# Patient Record
Sex: Female | Born: 1965 | Race: Black or African American | Hispanic: No | Marital: Married | Smoking: Never smoker
Health system: Southern US, Community
[De-identification: ages and names within clinical notes are randomized; demographics above are authoritative.]

## PROBLEM LIST (undated history)

## (undated) DIAGNOSIS — K209 Esophagitis, unspecified without bleeding: Secondary | ICD-10-CM

## (undated) DIAGNOSIS — K222 Esophageal obstruction: Secondary | ICD-10-CM

## (undated) DIAGNOSIS — K449 Diaphragmatic hernia without obstruction or gangrene: Secondary | ICD-10-CM

## (undated) DIAGNOSIS — K219 Gastro-esophageal reflux disease without esophagitis: Secondary | ICD-10-CM

## (undated) DIAGNOSIS — G43909 Migraine, unspecified, not intractable, without status migrainosus: Secondary | ICD-10-CM

## (undated) DIAGNOSIS — M1990 Unspecified osteoarthritis, unspecified site: Secondary | ICD-10-CM

## (undated) DIAGNOSIS — G4730 Sleep apnea, unspecified: Secondary | ICD-10-CM

## (undated) DIAGNOSIS — I10 Essential (primary) hypertension: Secondary | ICD-10-CM

## (undated) DIAGNOSIS — D509 Iron deficiency anemia, unspecified: Secondary | ICD-10-CM

## (undated) HISTORY — PX: KNEE SURGERY: SHX244

## (undated) HISTORY — PX: ESOPHAGOGASTRODUODENOSCOPY: SHX1529

## (undated) HISTORY — DX: Iron deficiency anemia, unspecified: D50.9

## (undated) HISTORY — DX: Migraine, unspecified, not intractable, without status migrainosus: G43.909

## (undated) HISTORY — DX: Morbid (severe) obesity due to excess calories: E66.01

---

## 1998-07-17 ENCOUNTER — Inpatient Hospital Stay (HOSPITAL_COMMUNITY): Admission: AD | Admit: 1998-07-17 | Discharge: 1998-07-17 | Payer: Self-pay | Admitting: *Deleted

## 1998-07-18 ENCOUNTER — Ambulatory Visit (HOSPITAL_COMMUNITY): Admission: RE | Admit: 1998-07-18 | Discharge: 1998-07-18 | Payer: Self-pay | Admitting: *Deleted

## 1998-08-13 ENCOUNTER — Encounter: Admission: RE | Admit: 1998-08-13 | Discharge: 1998-08-13 | Payer: Self-pay | Admitting: Obstetrics & Gynecology

## 1998-08-13 ENCOUNTER — Other Ambulatory Visit: Admission: RE | Admit: 1998-08-13 | Discharge: 1998-08-13 | Payer: Self-pay | Admitting: Obstetrics & Gynecology

## 1998-10-28 ENCOUNTER — Emergency Department (HOSPITAL_COMMUNITY): Admission: EM | Admit: 1998-10-28 | Discharge: 1998-10-28 | Payer: Self-pay

## 1999-03-18 ENCOUNTER — Encounter: Admission: RE | Admit: 1999-03-18 | Discharge: 1999-03-18 | Payer: Self-pay | Admitting: Obstetrics & Gynecology

## 1999-11-11 ENCOUNTER — Encounter: Admission: RE | Admit: 1999-11-11 | Discharge: 1999-11-11 | Payer: Self-pay | Admitting: Obstetrics & Gynecology

## 2001-01-21 ENCOUNTER — Other Ambulatory Visit: Admission: RE | Admit: 2001-01-21 | Discharge: 2001-01-21 | Payer: Self-pay | Admitting: Obstetrics and Gynecology

## 2001-03-09 HISTORY — PX: ABDOMINAL HYSTERECTOMY: SHX81

## 2001-08-25 ENCOUNTER — Encounter (INDEPENDENT_AMBULATORY_CARE_PROVIDER_SITE_OTHER): Payer: Self-pay

## 2001-08-25 ENCOUNTER — Ambulatory Visit (HOSPITAL_COMMUNITY): Admission: RE | Admit: 2001-08-25 | Discharge: 2001-08-25 | Payer: Self-pay | Admitting: Obstetrics and Gynecology

## 2001-10-05 ENCOUNTER — Encounter (INDEPENDENT_AMBULATORY_CARE_PROVIDER_SITE_OTHER): Payer: Self-pay

## 2001-10-05 ENCOUNTER — Inpatient Hospital Stay (HOSPITAL_COMMUNITY): Admission: RE | Admit: 2001-10-05 | Discharge: 2001-10-08 | Payer: Self-pay | Admitting: Obstetrics and Gynecology

## 2001-11-09 ENCOUNTER — Ambulatory Visit (HOSPITAL_COMMUNITY): Admission: RE | Admit: 2001-11-09 | Discharge: 2001-11-09 | Payer: Self-pay | Admitting: Obstetrics and Gynecology

## 2001-11-09 ENCOUNTER — Encounter: Payer: Self-pay | Admitting: Obstetrics and Gynecology

## 2002-02-08 ENCOUNTER — Other Ambulatory Visit: Admission: RE | Admit: 2002-02-08 | Discharge: 2002-02-08 | Payer: Self-pay | Admitting: Obstetrics and Gynecology

## 2002-02-14 ENCOUNTER — Encounter: Admission: RE | Admit: 2002-02-14 | Discharge: 2002-02-14 | Payer: Self-pay | Admitting: Obstetrics and Gynecology

## 2002-02-14 ENCOUNTER — Encounter: Payer: Self-pay | Admitting: Obstetrics and Gynecology

## 2002-11-02 ENCOUNTER — Emergency Department (HOSPITAL_COMMUNITY): Admission: EM | Admit: 2002-11-02 | Discharge: 2002-11-02 | Payer: Self-pay | Admitting: Emergency Medicine

## 2003-03-20 ENCOUNTER — Other Ambulatory Visit: Admission: RE | Admit: 2003-03-20 | Discharge: 2003-03-20 | Payer: Self-pay | Admitting: Obstetrics and Gynecology

## 2003-03-23 ENCOUNTER — Ambulatory Visit (HOSPITAL_COMMUNITY): Admission: RE | Admit: 2003-03-23 | Discharge: 2003-03-23 | Payer: Self-pay | Admitting: Gastroenterology

## 2003-03-26 ENCOUNTER — Ambulatory Visit (HOSPITAL_COMMUNITY): Admission: RE | Admit: 2003-03-26 | Discharge: 2003-03-26 | Payer: Self-pay | Admitting: Gastroenterology

## 2003-05-06 ENCOUNTER — Emergency Department (HOSPITAL_COMMUNITY): Admission: AD | Admit: 2003-05-06 | Discharge: 2003-05-06 | Payer: Self-pay | Admitting: Family Medicine

## 2003-05-16 ENCOUNTER — Encounter (INDEPENDENT_AMBULATORY_CARE_PROVIDER_SITE_OTHER): Payer: Self-pay | Admitting: Specialist

## 2003-05-16 ENCOUNTER — Encounter (INDEPENDENT_AMBULATORY_CARE_PROVIDER_SITE_OTHER): Payer: Self-pay | Admitting: *Deleted

## 2003-05-16 ENCOUNTER — Inpatient Hospital Stay (HOSPITAL_COMMUNITY): Admission: AD | Admit: 2003-05-16 | Discharge: 2003-05-19 | Payer: Self-pay | Admitting: Obstetrics and Gynecology

## 2003-07-13 ENCOUNTER — Ambulatory Visit (HOSPITAL_COMMUNITY): Admission: RE | Admit: 2003-07-13 | Discharge: 2003-07-13 | Payer: Self-pay | Admitting: Obstetrics and Gynecology

## 2003-08-13 ENCOUNTER — Ambulatory Visit (HOSPITAL_COMMUNITY): Admission: RE | Admit: 2003-08-13 | Discharge: 2003-08-13 | Payer: Self-pay | Admitting: Obstetrics and Gynecology

## 2003-10-29 ENCOUNTER — Emergency Department (HOSPITAL_COMMUNITY): Admission: EM | Admit: 2003-10-29 | Discharge: 2003-10-29 | Payer: Self-pay | Admitting: Emergency Medicine

## 2003-10-30 ENCOUNTER — Emergency Department (HOSPITAL_COMMUNITY): Admission: EM | Admit: 2003-10-30 | Discharge: 2003-10-30 | Payer: Self-pay | Admitting: Emergency Medicine

## 2004-04-16 ENCOUNTER — Emergency Department (HOSPITAL_COMMUNITY): Admission: EM | Admit: 2004-04-16 | Discharge: 2004-04-16 | Payer: Self-pay | Admitting: Emergency Medicine

## 2004-04-17 ENCOUNTER — Other Ambulatory Visit: Admission: RE | Admit: 2004-04-17 | Discharge: 2004-04-17 | Payer: Self-pay | Admitting: Obstetrics and Gynecology

## 2004-04-24 ENCOUNTER — Ambulatory Visit (HOSPITAL_COMMUNITY): Admission: RE | Admit: 2004-04-24 | Discharge: 2004-04-24 | Payer: Self-pay | Admitting: Obstetrics and Gynecology

## 2004-06-29 ENCOUNTER — Inpatient Hospital Stay (HOSPITAL_COMMUNITY): Admission: AD | Admit: 2004-06-29 | Discharge: 2004-06-29 | Payer: Self-pay | Admitting: Obstetrics and Gynecology

## 2004-07-30 ENCOUNTER — Ambulatory Visit (HOSPITAL_COMMUNITY): Admission: RE | Admit: 2004-07-30 | Discharge: 2004-07-30 | Payer: Self-pay | Admitting: Obstetrics and Gynecology

## 2004-09-07 ENCOUNTER — Encounter: Admission: RE | Admit: 2004-09-07 | Discharge: 2004-09-07 | Payer: Self-pay | Admitting: Obstetrics and Gynecology

## 2005-02-02 ENCOUNTER — Encounter: Admission: RE | Admit: 2005-02-02 | Discharge: 2005-02-02 | Payer: Self-pay | Admitting: Obstetrics and Gynecology

## 2005-02-03 ENCOUNTER — Observation Stay (HOSPITAL_COMMUNITY): Admission: EM | Admit: 2005-02-03 | Discharge: 2005-02-03 | Payer: Self-pay | Admitting: Emergency Medicine

## 2005-02-25 ENCOUNTER — Emergency Department (HOSPITAL_COMMUNITY): Admission: EM | Admit: 2005-02-25 | Discharge: 2005-02-25 | Payer: Self-pay | Admitting: Family Medicine

## 2005-04-20 ENCOUNTER — Other Ambulatory Visit: Admission: RE | Admit: 2005-04-20 | Discharge: 2005-04-20 | Payer: Self-pay | Admitting: Obstetrics and Gynecology

## 2005-05-21 ENCOUNTER — Encounter: Admission: RE | Admit: 2005-05-21 | Discharge: 2005-05-21 | Payer: Self-pay | Admitting: Obstetrics and Gynecology

## 2005-06-08 ENCOUNTER — Inpatient Hospital Stay (HOSPITAL_COMMUNITY): Admission: RE | Admit: 2005-06-08 | Discharge: 2005-06-11 | Payer: Self-pay | Admitting: Obstetrics and Gynecology

## 2005-06-08 ENCOUNTER — Encounter (INDEPENDENT_AMBULATORY_CARE_PROVIDER_SITE_OTHER): Payer: Self-pay | Admitting: *Deleted

## 2005-10-13 IMAGING — US US TRANSVAGINAL NON-OB
1 series · 14 of 25 positions shown · non-contrast
Comparison: none

CLINICAL DATA: Evaluate left adnexal mass.   Status post total abdominal hysterectomy and right oophorectomy in 6996.   Left oophorectomy in 0448.
PELVIC ULTRASOUND:
Correlation is made with the previous exam dated 06/29/04.  
A normal vaginal cuff is identified.  
In the left adnexa there is a complex cystic mass identified which has an overall dimension of 4.7 x 4.0 x 6.6 cm.   Attempting to measure on the prior exam in a similar plane, little internal change in the overall size is felt to have occurred.

[Series 1: us transvaginal non-ob · 0.15mm/px · 14 of 29 slices shown]
[im 1/29]
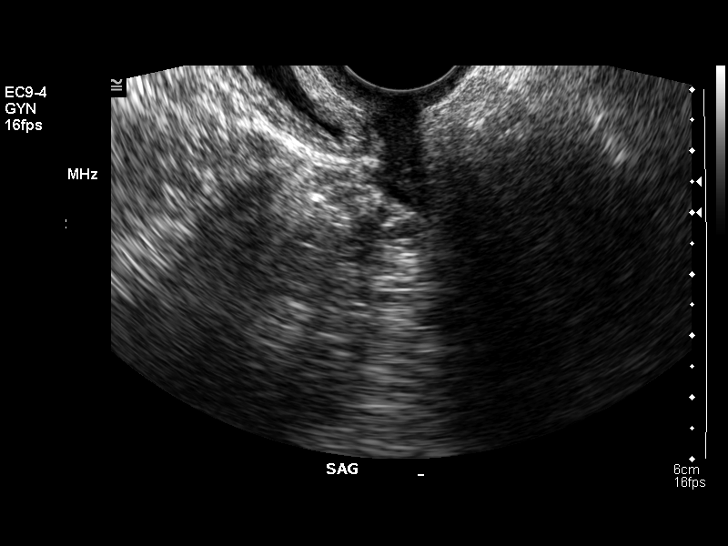
[im 3/29]
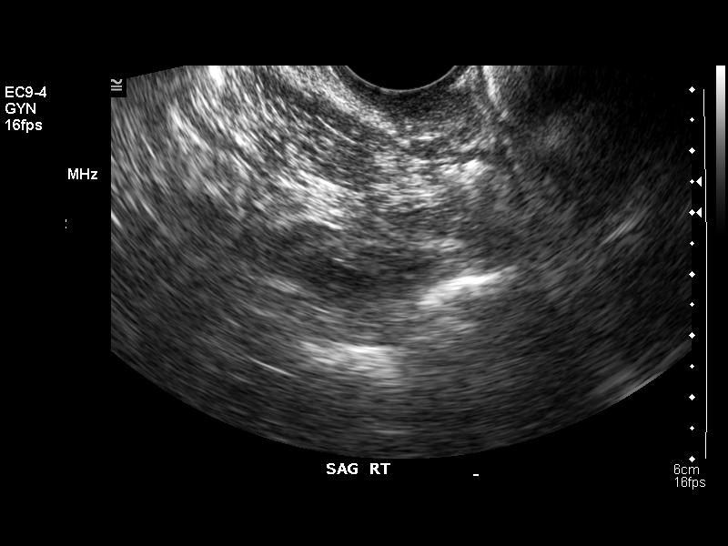
[im 5/29]
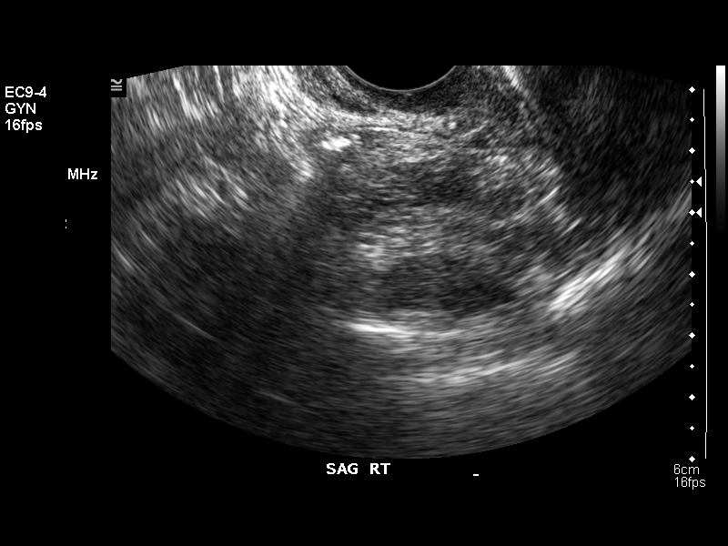
[im 8/29]
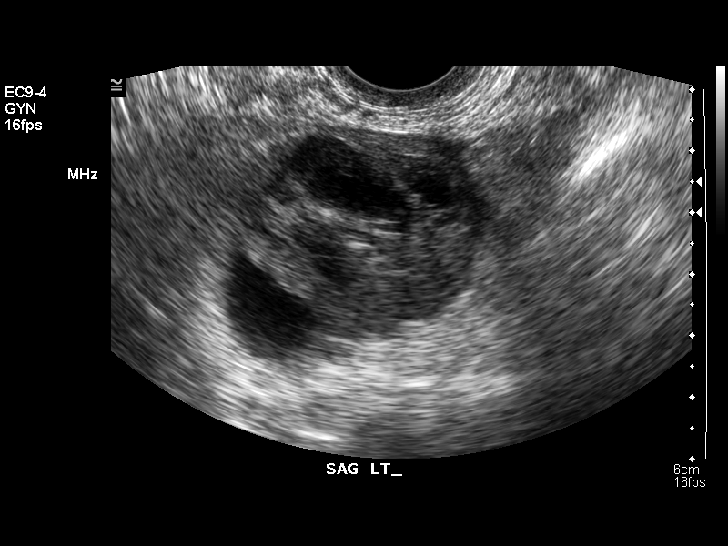
[im 10/29]
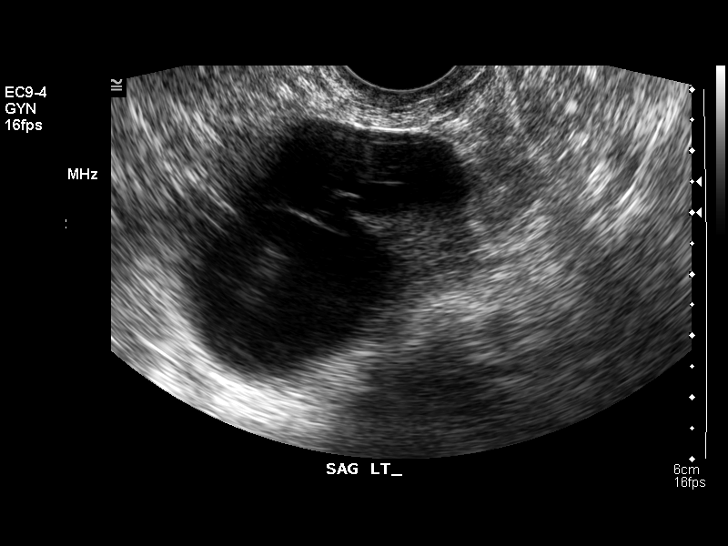
[im 11/29]
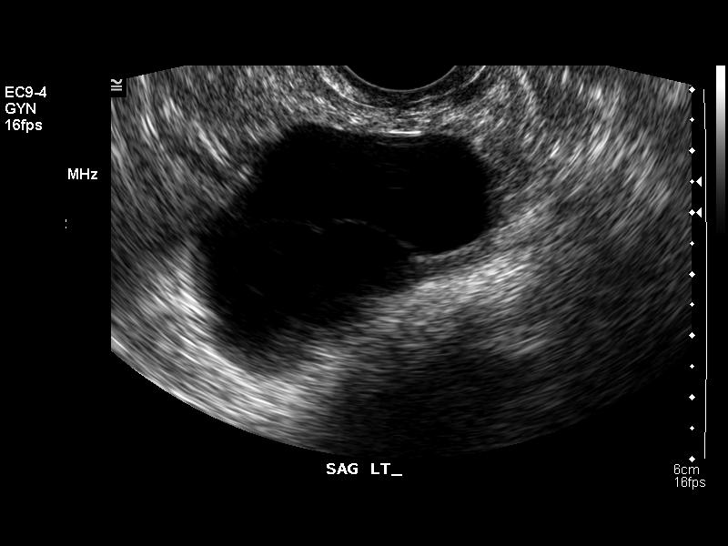
[im 13/29]
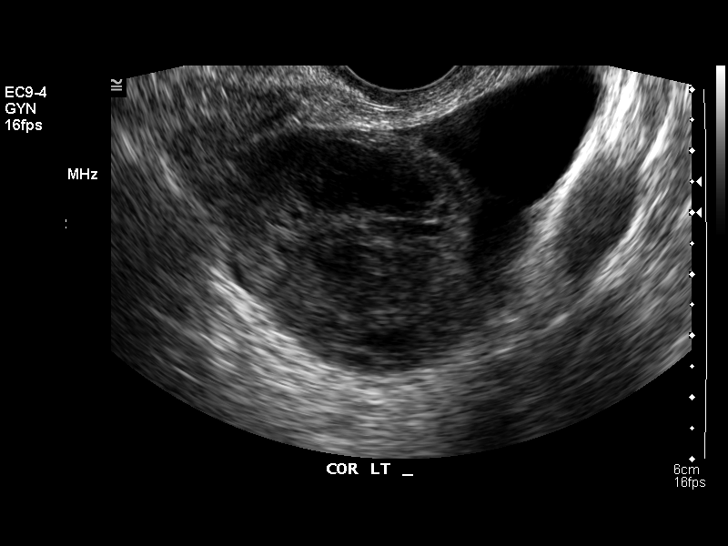
[im 16/29]
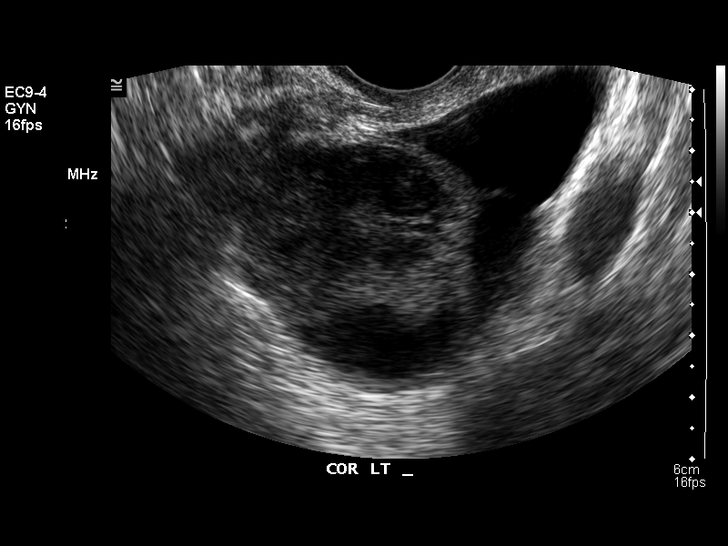
[im 18/29]
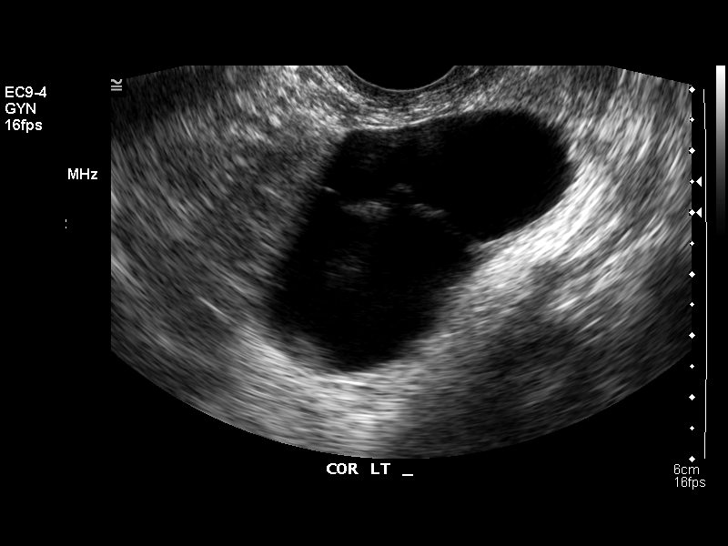
[im 19/29]
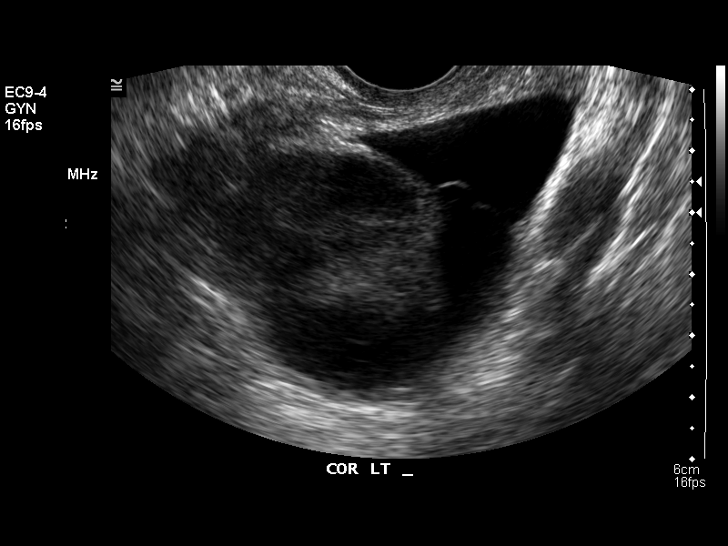
[im 22/29]
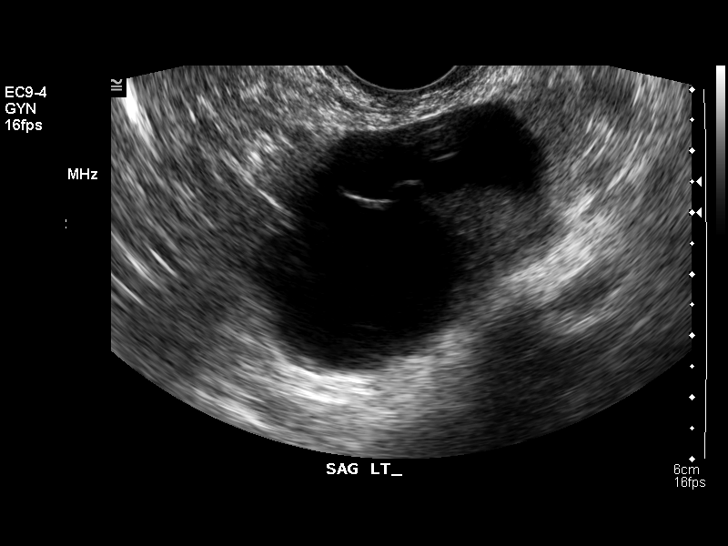
[im 24/29]
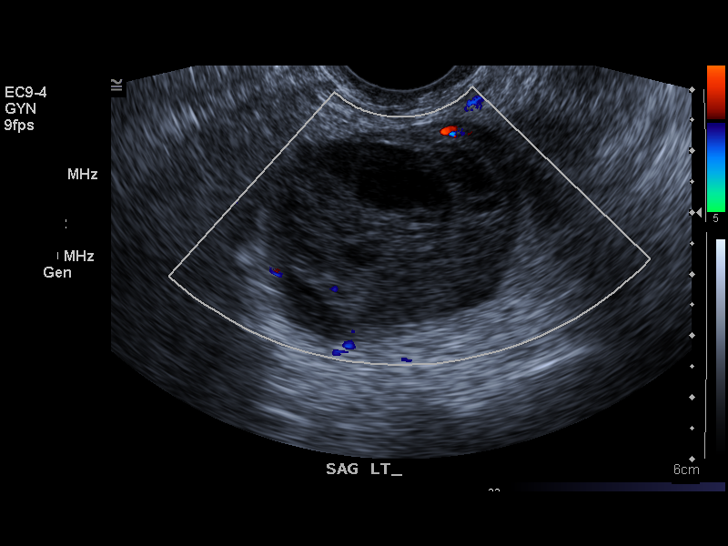
[im 26/29]
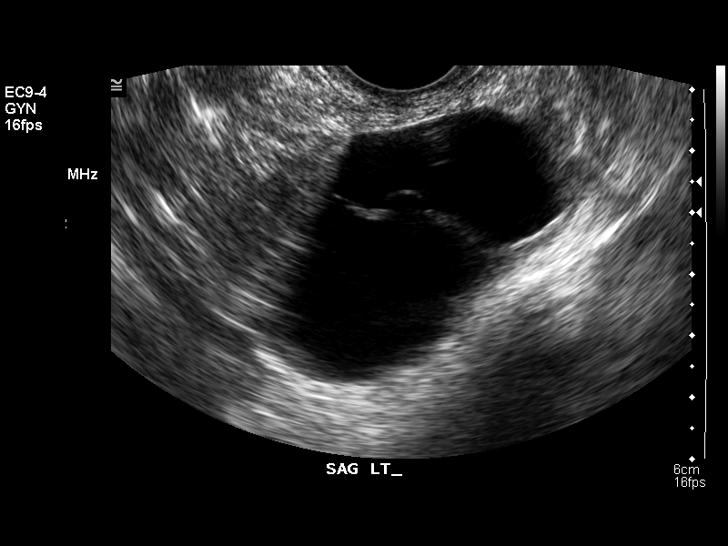
[im 29/29]
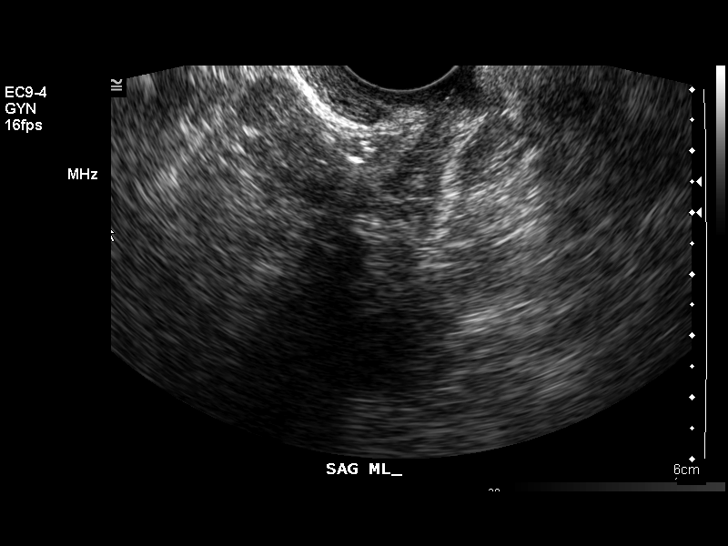

[14 of 25 positions shown; findings below may reference images not displayed]

IMPRESSION: Complex left adnexal mass of unclear etiology.   Please see above report for full discussion.

## 2005-10-26 ENCOUNTER — Emergency Department (HOSPITAL_COMMUNITY): Admission: EM | Admit: 2005-10-26 | Discharge: 2005-10-26 | Payer: Self-pay | Admitting: Emergency Medicine

## 2005-11-14 ENCOUNTER — Emergency Department (HOSPITAL_COMMUNITY): Admission: EM | Admit: 2005-11-14 | Discharge: 2005-11-14 | Payer: Self-pay | Admitting: Family Medicine

## 2005-11-21 ENCOUNTER — Emergency Department (HOSPITAL_COMMUNITY): Admission: EM | Admit: 2005-11-21 | Discharge: 2005-11-21 | Payer: Self-pay | Admitting: Emergency Medicine

## 2005-12-08 ENCOUNTER — Inpatient Hospital Stay (HOSPITAL_COMMUNITY): Admission: AD | Admit: 2005-12-08 | Discharge: 2005-12-08 | Payer: Self-pay | Admitting: Obstetrics and Gynecology

## 2006-05-24 ENCOUNTER — Encounter: Admission: RE | Admit: 2006-05-24 | Discharge: 2006-05-24 | Payer: Self-pay | Admitting: Obstetrics and Gynecology

## 2006-05-31 ENCOUNTER — Other Ambulatory Visit: Admission: RE | Admit: 2006-05-31 | Discharge: 2006-05-31 | Payer: Self-pay | Admitting: Obstetrics and Gynecology

## 2006-07-10 ENCOUNTER — Observation Stay (HOSPITAL_COMMUNITY): Admission: EM | Admit: 2006-07-10 | Discharge: 2006-07-10 | Payer: Self-pay | Admitting: *Deleted

## 2006-07-14 ENCOUNTER — Emergency Department (HOSPITAL_COMMUNITY): Admission: EM | Admit: 2006-07-14 | Discharge: 2006-07-14 | Payer: Self-pay | Admitting: Emergency Medicine

## 2006-11-19 ENCOUNTER — Emergency Department (HOSPITAL_COMMUNITY): Admission: EM | Admit: 2006-11-19 | Discharge: 2006-11-19 | Payer: Self-pay | Admitting: Family Medicine

## 2007-01-23 ENCOUNTER — Emergency Department (HOSPITAL_COMMUNITY): Admission: EM | Admit: 2007-01-23 | Discharge: 2007-01-23 | Payer: Self-pay | Admitting: Emergency Medicine

## 2007-02-16 ENCOUNTER — Emergency Department (HOSPITAL_COMMUNITY): Admission: EM | Admit: 2007-02-16 | Discharge: 2007-02-16 | Payer: Self-pay | Admitting: Emergency Medicine

## 2007-07-01 ENCOUNTER — Emergency Department (HOSPITAL_COMMUNITY): Admission: EM | Admit: 2007-07-01 | Discharge: 2007-07-01 | Payer: Self-pay | Admitting: Emergency Medicine

## 2007-07-05 ENCOUNTER — Inpatient Hospital Stay (HOSPITAL_COMMUNITY): Admission: EM | Admit: 2007-07-05 | Discharge: 2007-07-07 | Payer: Self-pay | Admitting: Emergency Medicine

## 2007-07-25 HISTORY — PX: CARDIOVASCULAR STRESS TEST: SHX262

## 2007-11-08 ENCOUNTER — Encounter: Admission: RE | Admit: 2007-11-08 | Discharge: 2007-11-08 | Payer: Self-pay | Admitting: Internal Medicine

## 2007-12-09 ENCOUNTER — Emergency Department (HOSPITAL_COMMUNITY): Admission: EM | Admit: 2007-12-09 | Discharge: 2007-12-09 | Payer: Self-pay | Admitting: Emergency Medicine

## 2008-05-30 ENCOUNTER — Encounter: Admission: RE | Admit: 2008-05-30 | Discharge: 2008-05-30 | Payer: Self-pay | Admitting: *Deleted

## 2008-06-10 ENCOUNTER — Emergency Department (HOSPITAL_COMMUNITY): Admission: EM | Admit: 2008-06-10 | Discharge: 2008-06-10 | Payer: Self-pay | Admitting: Emergency Medicine

## 2008-08-19 ENCOUNTER — Emergency Department (HOSPITAL_BASED_OUTPATIENT_CLINIC_OR_DEPARTMENT_OTHER): Admission: EM | Admit: 2008-08-19 | Discharge: 2008-08-19 | Payer: Self-pay | Admitting: Emergency Medicine

## 2008-10-14 ENCOUNTER — Emergency Department (HOSPITAL_COMMUNITY): Admission: EM | Admit: 2008-10-14 | Discharge: 2008-10-14 | Payer: Self-pay | Admitting: Emergency Medicine

## 2008-11-05 ENCOUNTER — Emergency Department (HOSPITAL_COMMUNITY): Admission: EM | Admit: 2008-11-05 | Discharge: 2008-11-05 | Payer: Self-pay | Admitting: Emergency Medicine

## 2008-12-11 ENCOUNTER — Encounter: Admission: RE | Admit: 2008-12-11 | Discharge: 2008-12-11 | Payer: Self-pay | Admitting: Family Medicine

## 2009-09-25 LAB — HM PAP SMEAR: HM Pap smear: NORMAL

## 2009-10-15 ENCOUNTER — Encounter: Admission: RE | Admit: 2009-10-15 | Discharge: 2009-10-15 | Payer: Self-pay | Admitting: Obstetrics

## 2010-03-30 ENCOUNTER — Encounter: Payer: Self-pay | Admitting: Obstetrics and Gynecology

## 2010-03-31 ENCOUNTER — Encounter: Payer: Self-pay | Admitting: Family Medicine

## 2010-04-03 ENCOUNTER — Emergency Department (HOSPITAL_COMMUNITY)
Admission: EM | Admit: 2010-04-03 | Discharge: 2010-04-03 | Payer: Self-pay | Source: Home / Self Care | Admitting: Family Medicine

## 2010-04-22 ENCOUNTER — Ambulatory Visit (INDEPENDENT_AMBULATORY_CARE_PROVIDER_SITE_OTHER): Payer: BC Managed Care – PPO | Admitting: Cardiovascular Disease

## 2010-04-22 DIAGNOSIS — R079 Chest pain, unspecified: Secondary | ICD-10-CM

## 2010-05-20 ENCOUNTER — Emergency Department (INDEPENDENT_AMBULATORY_CARE_PROVIDER_SITE_OTHER): Payer: BC Managed Care – PPO

## 2010-05-20 ENCOUNTER — Emergency Department (HOSPITAL_BASED_OUTPATIENT_CLINIC_OR_DEPARTMENT_OTHER)
Admission: EM | Admit: 2010-05-20 | Discharge: 2010-05-20 | Disposition: A | Discharge status: Home / Self Care | Payer: BC Managed Care – PPO | Attending: Emergency Medicine | Admitting: Emergency Medicine

## 2010-05-20 DIAGNOSIS — E669 Obesity, unspecified: Secondary | ICD-10-CM | POA: Insufficient documentation

## 2010-05-20 DIAGNOSIS — M722 Plantar fascial fibromatosis: Secondary | ICD-10-CM | POA: Insufficient documentation

## 2010-05-20 DIAGNOSIS — K219 Gastro-esophageal reflux disease without esophagitis: Secondary | ICD-10-CM | POA: Insufficient documentation

## 2010-05-20 DIAGNOSIS — M79609 Pain in unspecified limb: Secondary | ICD-10-CM

## 2010-05-20 DIAGNOSIS — I10 Essential (primary) hypertension: Secondary | ICD-10-CM | POA: Insufficient documentation

## 2010-06-14 LAB — POCT I-STAT, CHEM 8
BUN: 4 mg/dL — ABNORMAL LOW (ref 6–23)
Calcium, Ion: 1.14 mmol/L (ref 1.12–1.32)
HCT: 37 % (ref 36.0–46.0)
TCO2: 24 mmol/L (ref 0–100)

## 2010-06-14 LAB — DIFFERENTIAL
Basophils Absolute: 0.1 10*3/uL (ref 0.0–0.1)
Basophils Absolute: 0.1 10*3/uL (ref 0.0–0.1)
Basophils Relative: 1 % (ref 0–1)
Basophils Relative: 1 % (ref 0–1)
Eosinophils Absolute: 0.3 10*3/uL (ref 0.0–0.7)
Eosinophils Relative: 3 % (ref 0–5)
Eosinophils Relative: 3 % (ref 0–5)
Lymphs Abs: 4.6 10*3/uL — ABNORMAL HIGH (ref 0.7–4.0)
Monocytes Absolute: 1 10*3/uL (ref 0.1–1.0)
Monocytes Relative: 8 % (ref 3–12)
Neutro Abs: 6.2 10*3/uL (ref 1.7–7.7)

## 2010-06-14 LAB — CBC
HCT: 36.2 % (ref 36.0–46.0)
HCT: 37.5 % (ref 36.0–46.0)
Hemoglobin: 12.2 g/dL (ref 12.0–15.0)
Hemoglobin: 12.3 g/dL (ref 12.0–15.0)
MCHC: 33.6 g/dL (ref 30.0–36.0)
MCV: 85.5 fL (ref 78.0–100.0)
RDW: 14.8 % (ref 11.5–15.5)
RDW: 14.4 % (ref 11.5–15.5)

## 2010-06-14 LAB — BASIC METABOLIC PANEL
GFR calc non Af Amer: >60 mL/min (ref >60)
Glucose, Bld: 93 mg/dL (ref 70–99)
Potassium: 3.6 mEq/L (ref 3.5–5.1)
Sodium: 137 mEq/L (ref 135–145)

## 2010-06-14 LAB — POCT CARDIAC MARKERS

## 2010-06-14 LAB — BRAIN NATRIURETIC PEPTIDE: Pro B Natriuretic peptide (BNP): 64 pg/mL (ref 0.0–100.0)

## 2010-06-16 LAB — BASIC METABOLIC PANEL
BUN: 12 mg/dL (ref 6–23)
CO2: 27 mEq/L (ref 19–32)
Chloride: 104 mEq/L (ref 96–112)
Glucose, Bld: 82 mg/dL (ref 70–99)
Potassium: 4 mEq/L (ref 3.5–5.1)

## 2010-06-16 LAB — POCT CARDIAC MARKERS
CKMB, poc: <1 ng/mL — ABNORMAL LOW (ref 1.0–8.0)
CKMB, poc: <1 ng/mL — ABNORMAL LOW (ref 1.0–8.0)
Myoglobin, poc: 46.7 ng/mL (ref 12–200)

## 2010-06-16 LAB — D-DIMER, QUANTITATIVE: D-Dimer, Quant: 0.22 ug/mL-FEU (ref 0.00–0.48)

## 2010-06-16 LAB — DIFFERENTIAL
Basophils Absolute: 0.1 10*3/uL (ref 0.0–0.1)
Eosinophils Absolute: 0.2 10*3/uL (ref 0.0–0.7)
Eosinophils Relative: 2 % (ref 0–5)
Monocytes Absolute: 1 10*3/uL (ref 0.1–1.0)

## 2010-06-16 LAB — CBC
HCT: 36.7 % (ref 36.0–46.0)
MCV: 84.9 fL (ref 78.0–100.0)
Platelets: 308 10*3/uL (ref 150–400)
RDW: 13.7 % (ref 11.5–15.5)

## 2010-06-18 LAB — DIFFERENTIAL
Basophils Relative: 1 % (ref 0–1)
Eosinophils Absolute: 0.3 10*3/uL (ref 0.0–0.7)
Monocytes Absolute: 0.9 10*3/uL (ref 0.1–1.0)
Monocytes Relative: 8 % (ref 3–12)

## 2010-06-18 LAB — CBC
MCHC: 34.3 g/dL (ref 30.0–36.0)
MCV: 84 fL (ref 78.0–100.0)
Platelets: 270 10*3/uL (ref 150–400)

## 2010-06-18 LAB — POCT I-STAT, CHEM 8
Creatinine, Ser: 0.7 mg/dL (ref 0.4–1.2)
Glucose, Bld: 89 mg/dL (ref 70–99)
Hemoglobin: 12.6 g/dL (ref 12.0–15.0)
TCO2: 22 mmol/L (ref 0–100)

## 2010-06-18 LAB — CK TOTAL AND CKMB (NOT AT ARMC): Total CK: 108 U/L (ref 7–177)

## 2010-06-18 LAB — POCT CARDIAC MARKERS: CKMB, poc: <1 ng/mL — ABNORMAL LOW (ref 1.0–8.0)

## 2010-07-22 NOTE — H&P (Signed)
[name], [name]           ACCOUNT NO.:  [id number]   MEDICAL RECORD NO.:  [id number]          PATIENT TYPE:  INP   LOCATION:  2019                         FACILITY:  [facility]   PHYSICIAN:  Lauralee Evener, MDDATE OF BIRTH:  1965-09-06   DATE OF ADMISSION:  07/05/2007  DATE OF DISCHARGE:                              HISTORY & PHYSICAL   CHIEF COMPLIANT:  Chest pain.   HISTORY OF PRESENT ILLNESS:  [name] is a 45 year old woman with a  history of borderline hypertension, tachycardia, GERD, and probable  obstructive sleep apnea, who returns to the ED with recurrent chest  pain, palpitations, diaphoresis, and nausea after a negative cardiac  workup on July 01, 2007.  Last night, she had episodes of 10/10  substernal chest pain that radiated to the back.  This has continued  today.  The pain is sharp and lasts for minutes at a time, this is  relieved with Tylenol.  The pain occurs at rest.  It is not worsened  with exertion.  It is associated with symptoms of above as well as  presyncope.  She has no shortness of breath.  It is not associated with  oral intake.  She denies orthopnea, PND, or lower extremity swelling.  She has no family history of MI.  She has a sister who died of  arrhythmia that was associated with alcohol abuse.  [name] had a  normal stress test approximately 4 years ago.   REVIEW OF SYSTEMS:  Comprehensive 10-point review of systems was  obtained and was negative, except as per the HPI.   PAST MEDICAL HISTORY:  GERD, hypertension, obesity, probable obstructive  sleep apnea, and normal stress test 4 years ago.   FAMILY HISTORY:  Sister died of arrhythmia associated with alcohol  abuse.  No coronary artery disease or MI.  Sister with diabetes.   SOCIAL HISTORY:  She lives in [place] with her sisters.  She denies  tobacco, alcohol, or illicit drug use.  She works as a Lawyer.   ALLERGIES:  No known drug allergies.   MEDICATIONS:  Toprol-XL  50 mg p.o. daily and Aciphex.   PHYSICAL EXAMINATION:  VITAL SIGNS:  Pulse 58, blood pressure 119/62,  respiratory rate 18, O2 sat 99% on room air, and temperature 37.6.  GENERAL:  A well nourished, well developed, overweight woman in no acute  distress.  HEENT:  Sclerae intact.  Oropharynx clear.  NECK:  Supple with no increased JVP.  HEART:  S1 and S2, regular rate and rhythm.  No murmurs, rubs, or  gallops.  LUNGS:  Clear to auscultation bilaterally.  ABDOMEN:  Soft, nontender, and nondistended.  Positive bowel sounds.  EXTREMITIES:  Warm with no edema.   DATA REVIEW:  D-dimer 0.34.  Other labs pending.   EKG, no ST or T-wave changes.  Normal sinus rhythm.   ASSESSMENT AND PLAN:  1. Chest pain.  This is nonspecific in nature.  The history of      radiation to the back raises concern for aortic dissection,      although she is hemodynamically stable.  We will get a  CT of the      chest given the long-term recurrent nature of this pain.  We will      also rule out a pulmonary embolism with this study.  Although, she      does not have any shortness of breath.  We will check her troponins      x3.  We will place her on telemetry.  We will give her aspirin.  We      will use oxycodone and nitroglycerin for chest pain.  We will keep      her n.p.o. in case her team decides for cardiac catheterization or      stress test in the morning.  Of note, her EKG was normal and the      troponins were normal on July 01, 2007.  2. Risk factor screening.  Ms. Grams says she has a normal lipid      panel approximately 1 week ago.  We will check an A1c in the      morning.  We will chest her TSH.  3. Deep vein thrombosis prophylaxis with Lovenox.      Lauralee Evener, MD  Electronically Signed     WT/MEDQ  D:  07/05/2007  T:  07/06/2007  Job:  [id number]

## 2010-07-25 NOTE — Op Note (Signed)
[facility]  Patient:    NOBLE, BODIE Visit Number: [id number] MRN: [id number]          Service Type: DSU Location: [facility] Attending Physician:  Wandalee Ferdinand Dictated by:   Rudy Jew Ashley Royalty, M.D. Proc. Date: 08/25/01 Admit Date:  08/25/2001                             Operative Report  PREOPERATIVE DIAGNOSES:       1. Right adnexal cyst, persistent.                               2. Right hydrosalpinx.                               3. Fibroid uterus.                               4. Right lower quadrant discomfort.  POSTOPERATIVE DIAGNOSES:      1. Right ovarian cyst.                               2. Right hydrosalpinx.                               3. Fibroid uterus.                               4. Extensive pelvic adhesions.  PROCEDURE:                    1. Diagnostic/operative laparoscopy.                               2. Right salpingo-oophorectomy.                               3. Lysis of adhesions, extensive.  SURGEON:                      Rudy Jew. Ashley Royalty, M.D.  ANESTHESIA:                   General endotracheal.  ESTIMATED BLOOD LOSS:         50 cc.  COMPLICATIONS:                None.  PACKS AND DRAINS:             None.  PROCEDURE:                    The patient was taken to the operating room and placed in the dorsal supine position.  After adequate general endotracheal anesthesia was administered, she was placed in the lithotomy position and prepped and draped in the usual manner for abdomen and vaginal surgery.  A posterior weighted retractor was placed per vagina.  The anterior lip of the cervix was grasped with a single tooth tenaculum.  Jarcho uterine manipulator was placed in the cervix.  The bladder was drained with  a red rubber catheter. Next, a 1.5 cm infraumbilical incision was made in the longitudinal plane. The size 10-11 laparoscopic trocar was placed into the abdominal cavity without difficulty.   Its location was verified by placement of the laparoscope.  There was no evidence of any trauma.  Next, a 5 mm suprapubic trocar was replaced in the left and right lower quadrants, respectively. Transillumination and direct visualization techniques were employed.  The pelvis was thoroughly surveyed.  The uterine corpus was approximately 5 x 5 x 4 cm.  There was a 5 x 5 x 4 cm pedunculated fibroid arising from the right fundal portion of the corpus.  The pelvis was noted to contain multiple adhesions with some sharp and blunt dissection.  The left tube and ovary were seen in their entirety.  The left ovary was normal size, shape, and contour without evidence of any cysts or endometriosis.  The left fallopian tube was normal size, shape, contour, and length with luxuriant fimbria.  Attempts to visualize the right adnexa were difficult due to the fact that the fibroid was on the right side and also the adhesions were much more extensive.  Using the bipolar cautery and scissors, the adhesions were lysed one by one.  This added approximately 30-40 minutes to the operative time.  Once the adhesions were sufficiently lysed, the right ovary was noted to be approximately 4 cm in greatest diameter with one or more apparent cysts contained within it.  The right tube was noted to be significantly dilated constituting a hydrosalpinx. Appropriate photos were obtained.  The remainder of the peritoneal surfaces showed no evidence of endometriosis.  There were some scattered adhesions, but none as severe as those which encased the right adnexa.  In keeping with the patients wishes, the decision was made to proceed with right salpingo-oophorectomy.  The tripolar cautery was employed in order to secure the infundibulopelvic ligament.  The ureter was noted to be well below the plane of dissection.  The tripolar cautery was also employed to transect the fallopian tube at the cornu and the utero-ovarian  ligament.  Next, a 12 mm trocar was placed in the right lower quadrant in the same track as the previous 5 mm trocar.  This was placed in order to accommodate the endoscopic stapler.  The stapler was employed in order to excise the remainder of the right tube and ovary from the mesosalpinx.  The specimen was satisfactorily excised and placed in the cul-de-sac for later removal.  Pedicles were inspected and a couple small oozing vessels were controlled with the bipolar cautery without difficulty.  Copious irrigation was accomplished with the Nezhat suction irrigator.  Hemostasis was noted.  Next, an endo catch bag was placed in the abdominal cavity.  The specimen was then placed in the bag and removed by extending the right lower quadrant incision sufficiently to deliver the specimen.  The specimen was delivered to pathology for histologic studies. Next, the patient was felt to have benefited maximally from the surgical procedure and hence all abdominal instruments were removed and pneumoperitoneum evacuated.  Fascial defects were closed with 0 Vicryl in a running fashion.  Particular attempt was made in the right lower quadrant to close the fascia particularly well since the incision was larger.  The skin was closed with 3-0 chromic in a subcuticular fashion.  Vaginal instruments were removed and procedure terminated.  The patient tolerated the procedure extremely well and was returned to the recovery room in good condition. Dictated by:  James A. Ashley Royalty, M.D. Attending Physician:  Wandalee Ferdinand DD:  08/25/01 TD:  08/26/01 Job: [id number]

## 2010-07-25 NOTE — Discharge Summary (Signed)
[name], [name]           ACCOUNT NO.:  [id number]   MEDICAL RECORD NO.:  [id number]          PATIENT TYPE:  INP   LOCATION:  6526                         FACILITY:  [facility]   PHYSICIAN:  Sherin Quarry, MD      DATE OF BIRTH:  Nov 02, 1965   DATE OF ADMISSION:  02/03/2005  DATE OF DISCHARGE:  02/03/2005                                 DISCHARGE SUMMARY   [name] was a 45 year old lady, whose past history was remarkable  for a previous hysterectomy, who presented with a six-hour history of chest  pain on February 03, 2005 described as sharp, intermittent, heavy in  quality, intermittently radiating down her left arm. There was no associated  nausea, vomiting, shortness of breath or diaphoresis. She denied associated  coughing, fever or dysuria. In the emergency room, a d-dimer was performed  which was normal. Chest x-ray was within normal limits. Dr. Suanne Marker, of [facility], was asked to see her. Dr. Suanne Marker reported that on  physical exam her blood pressure is 149/95, pulse was 75, respirations 14,  O2 saturation 99%. HEENT exam was within normal limits. The chest was clear.  Cardiovascular revealed normal S1-S2. The abdomen was benign. Neurologic  testing was within normal limits. Examination of the extremities was  remarkable only for some mild right calf tenderness.   Other relevant laboratory studies included normal electrolytes, BUN was 12,  creatinine 0.6. Hemoglobin 11.6. An EKG showed evidence of left ventricular  hypertrophy by voltage criteria. Serial cardiac enzymes were negative. The  lipid profile revealed an LDL cholesterol 108, HDL of 50.   The patient felt well by the next morning. She was seen in consultation by  [facility] and it was felt that the patient could  safely be discharged and have a Cardiolite study done as an outpatient. The  patient was given instructions to return to see the cardiologists as an  outpatient in their office on the following Monday. Therefore, on February 03, 2005, the patient was discharged.   DISCHARGE DIAGNOSES:  1.  Atypical chest pain probably not of cardiac origin.  2.  Right leg pain probably secondary to muscular strain. Note, the patient      had a venous Doppler study while in the hospital that was negative.  3.  Obesity.   On discharge, the patient was advised to take aspirin 325 milligrams 1  tablet daily at least until she was seen in follow-up by the cardiologist.           ______________________________  Sherin Quarry, MD    SY/MEDQ  D:  02/03/2005  T:  02/03/2005  Job:  [id number]   cc:   [facility]   Gabriel Earing, M.D.  Fax: [phone]

## 2010-07-25 NOTE — H&P (Signed)
[name], [name]                       ACCOUNT NO.:  [id number]   MEDICAL RECORD NO.:  [id number]                   PATIENT TYPE:  INP   LOCATION:  NA                                   [facility]   PHYSICIAN:  James A. Ashley Royalty                   DATE OF BIRTH:  [birth year]   DATE OF ADMISSION:  10/05/2001  DATE OF DISCHARGE:                                HISTORY & PHYSICAL   HISTORY OF PRESENT ILLNESS:  This is a 45 year old gravida 1, para 0, AB1  with a known fibroid uterus.  She is status post diagnostic/operative  laparoscopy with right salpingo-oophorectomy and lysis of adhesions on August 25, 2001.  She had that procedure done in an effort to remove an ovarian  cyst and hopefully a source of right lower quadrant discomfort.  Though she  had a known fibroid uterus at the time of the procedure, she was not  interested in a hysterectomy at that time.  That pathology returned showing  benign hemorrhagic cyst and hydrosalpinx of the right ovary.  At the time of  the patient's postoperative visit, September 28, 2001, she complained of having  debilitating right lower quadrant discomfort unrelieved after the  laparoscopy.  At the time of the laparoscopy the fibroid uterus was noted to  be mostly involving a solitary large fibroid of approximately 5 cm in  greatest diameter arising from the serosal surface of the uterus and  emanating mostly from the right aspect of the uterus.  It was felt to be a  strong candidate for the source of the patient's pain.  Again, she states  desire for removal of the uterus in an effort to attempt to alleviate the  lower abdominal discomfort.   PAST MEDICAL HISTORY:  Negative.   PAST SURGICAL HISTORY:  As above.   ALLERGIES:  None.   FAMILY HISTORY:  Negative.   SOCIAL HISTORY:  The patient denies use of tobacco or significant alcohol.   REVIEW OF SYSTEMS:  Noncontributory.   PHYSICAL EXAMINATION:  GENERAL:  Well-developed, well-nourished,  pleasant  black female in no acute distress.  VITAL SIGNS:  Afebrile.  Vital signs stable.  SKIN:  Warm and dry without lesions.  LYMPHATICS:  There is no supraclavicular, cervical, or inguinal adenopathy.  HEENT:  Normocephalic.  NECK:  Supple without thyromegaly.  CHEST:  Lungs are clear.  CARDIAC:  Regular rate and rhythm without murmurs, gallops, or rubs.  BREASTS:  Deferred.  ABDOMEN:  Soft, nontender without masses or organomegaly.  Bowel sounds are  active.  MUSCULOSKELETAL:  Reveals full range of motion without edema, cyanosis, or  CVA tenderness.  PELVIC:  Deferred until examination under anesthesia.   IMPRESSION:  1. Fibroid uterus.  2. Right lower quadrant discomfort probably secondary to number one.  3. Status post diagnostic/operative laparoscopy with right salpingo-     oophorectomy and lysis of adhesions.  PLAN:  Total abdominal hysterectomy.  Risks, benefits, complications, and  alternatives were fully discussed with the patient.  The possibility of  performing a left salpingo-oophorectomy discussed and accepted.  The patient  understands in that event she may be a candidate for hormone replacement  therapy for the indefinite future.  She states she understands and accepts.  Questions invited and answered.                                               James A. Ashley Royalty    JM/MEDQ  D:  10/04/2001  T:  10/05/2001  Job:  [id number]

## 2010-07-25 NOTE — Discharge Summary (Signed)
   [name], [name]                       ACCOUNT NO.:  [id number]   MEDICAL RECORD NO.:  [id number]                   PATIENT TYPE:  OUT   LOCATION:  CATS                                 [facility]ILITY[facility]   PHYSICIAN:  Rudy Jew. Ashley Royalty, M.D.             DATE OF BIRTH:  1965/09/14   DATE OF ADMISSION:  10/05/2001  DATE OF DISCHARGE:  10/08/2001                                 DISCHARGE SUMMARY   DISCHARGE DIAGNOSES:  1. Intramural endosalpingiosis.  2. Fibroid uterus.  3. Right lower quadrant probably secondary to number two.   PROCEDURE:  Total abdominal hysterectomy.   DISCHARGE MEDICATIONS:  Percocet.   CONSULTATIONS:  None.   HISTORY AND PHYSICAL:  This is a 45 year old gravida 1, para 0, AB1 with a  known fibroid uterus.  The patient had a diagnostic laparoscopy June 2003.  Since that procedure was done the patient noted increasing right lower  quadrant discomfort which was unrelieved after the laparoscopy.  The patient  was hence admitted for hysterectomy secondary to unremitting pain after a  laparoscopy in the face of a known fibroid uterus.   HOSPITAL COURSE:  The patient was admitted to [facility] of  [place].  Admission laboratory studies were drawn.  On October 05, 2001 she  was taken to the operating room and underwent total abdominal hysterectomy.  The procedure was performed by Dr. Sylvester Harder.  The patient's  postoperative course was benign.  She was discharged home on October 08, 2001  afebrile and in satisfactory condition.   ACCESSORY CLINICAL FINDINGS:  Hemoglobin and hematocrit on admission were  10.6 and 32.7, respectively.  Repeat values were obtained October 07, 2001 and  were 9.7 and 29.8, respectively.  CM 24 was essentially within normal  limits.  Type and Rh revealed O+ blood.   DISPOSITION:  The patient is return to [facility] in four to six  weeks for postoperative evaluation.     James A. Ashley Royalty, M.D.    JAM/MEDQ  D:  11/09/2001  T:  11/10/2001  Job:  [id number]

## 2010-07-25 NOTE — Op Note (Signed)
[name], [name]                       ACCOUNT NO.:  [id number]   MEDICAL RECORD NO.:  [id number]                   PATIENT TYPE:  INP   LOCATION:  9325                                 [facility]ILITY[facility]   PHYSICIAN:  James A. Ashley Royalty, M.D.             DATE OF BIRTH:  Feb 13, 1966   DATE OF PROCEDURE:  DATE OF DISCHARGE:  10/08/2001                                 OPERATIVE REPORT   PREOPERATIVE DIAGNOSIS:  Pelvic pain.   POSTOPERATIVE DIAGNOSIS:  Pelvic pain.   PROCEDURE:  Total abdominal hysterectomy.   SURGEON:  Rudy Jew. Ashley Royalty, M.D.   ASSESSMENT:  [name]   ANESTHESIA:  General endotracheal.   ESTIMATED BLOOD LOSS:  350 cc   COMPLICATIONS:  None.   PACKS AND DRAINS:  Foley.   COUNTS:  Sponge, needle, and instrument count reported as correct x 2.   DESCRIPTION OF PROCEDURE:  The patient was taken to the operating room and  placed in the dorsosupine position.  After adequate general anesthesia was  administered, she was prepped and draped in the usual manner for abdominal  and vaginal surgery.  Foley catheter was placed.  A Pfannenstiel incision  was made in the lower abdomen, and the laparoscopy incision, which was  somewhat hypertrophic in he right lower quadrant, was excised and thus  incorporated into the larger Pfannenstiel incision. The  subcutaneous  tissues were sharply and bluntly dissected down to the fascia which was  nicked with a knife and incised transversely with Mayo scissors.  The  underlying rectus muscles were separated from the fascia using sharp and  blunt dissection.  The rectus muscles were separated in the midline exposing  the peritoneum which was elevated with hemostats and entered atraumatically  with Metzenbaum scissors.  The incision was extended longitudinally.  The  pelvic contents were identified.  The large fibroid uterus was encountered.  The right adnexa was surgically absent.  The left adnexa was inspected.  The  fallopian tube was normal size, shape, contour, and length.  The left ovary  was upper limits of normal size without evidence of any endometriosis or  significant cysts.  The remainder of the peritoneal surfaces were smooth and  glistening.  The uterus was grasped on either side with Kelly clamps.  The  round ligaments were clamped, cut, and secured with #1 chromic catgut.  A  bladder flap was created by incising the anterior uterine serosa and sharply  and bluntly dissecting the bladder inferiorly. The broad ligament was  further opened.  The left fallopian tube, utero-ovarian anastomosis, and  tubo-ovarian ligament were clamped and doubly secured with #1 chromic  catgut, thus preserving the left adnexa. The left broad ligament was opened  and uterine vessels skeletonized.  The uterine vessels were bilaterally  clamped, cut, and secured with #1 chromic catgut.  Next, straight Heaney  Ballentine clamps were used to come down on either side of the uterus  to  secure the cardinal ligaments.  Each pedicle was cut and secured with #1  chromic catgut.  The uterosacral ligaments were clamped and cut bilaterally.  The pedicles were held for later approximation.  The vagina was entered  anterior ly.  The cervix was incised with Satinsky scissors.  Next,  Richardson angle sutures were placed with #1 chromic catgut into the vaginal  angles laterally.  Next, a 2-0 Vicryl circumferentially running locking  suture was placed in order to hemostatically secure the vaginal cuff.  One  interrupted suture was placed in the midline of the vaginal cuff to avoid  hernia formation.  Hemostasis was noted.  Next, copious irrigation was  accomplished.  Hemostasis was noted.  There was no evidence of any urinary  tract trauma.  The ureters were identified bilaterally and felt to be well  below the plane of dissection.  There was slight hematuria noted but again  no evidence of any urinary tract injury.  At this point,  all packs and  retractors were removed.  The peritoneum was closed with 0 chromic in a  running fashion.  The fascia was closed with 0 chromic in a running fashion.  The subcutaneous tissues were approximated with 2-0 Vicryl in interrupted  fashion.  The skin was closed with staples.  The patient tolerated the  procedure extremely well and was returned to the recovery room in good  condition.                                               James A. Ashley Royalty, M.D.    JAM/MEDQ  D:  10/05/2001  T:  10/10/2001  Job:  [id number]

## 2010-07-25 NOTE — Op Note (Signed)
[name], [name]           ACCOUNT NO.:  [id number]   MEDICAL RECORD NO.:  [id number]          PATIENT TYPE:  INP   LOCATION:  9318                          FACILITY:  [facility]   PHYSICIAN:  James A. Ashley Royalty, M.D.DATE OF BIRTH:  1965/05/19   DATE OF PROCEDURE:  06/08/2005  DATE OF DISCHARGE:                                 OPERATIVE REPORT   PREOPERATIVE DIAGNOSES:  1.  Status post total abdominal hysterectomy, left salpingo-oophorectomy.  2.  Status post right salpingo-oophorectomy in a separate setting.  3.  Multi-septated cystic lesion in the pelvis - probable endometriosis.  4.  Pelvic pain - debilitating.   POSTOPERATIVE DIAGNOSES:  1.  Status post total abdominal hysterectomy, left salpingo-oophorectomy.      Pathology pending.  2.  Pelvic adhesions - extensive.   PROCEDURE:  Exploratory laparotomy with removal of pelvic mass and lysis of  adhesions (extensive).   SURGEON:  Rudy Jew. Ashley Royalty, MD   ASSISTANT:  Loreli Dollar, MD   ANESTHESIA:  General.   ESTIMATED BLOOD LOSS:  100 mL.   COMPLICATIONS:  None.   PACKS AND DRAINS:  Foley.   SPONGE NEEDLE AND INSTRUMENT COUNTS:  Reported as correct x2.   PROCEDURE:  The patient was taken to the operating room and placed in the  dorsal supine position.  After general anesthesia was administered, she was  placed in the  Dictation ends at this point.      James A. Ashley Royalty, M.D.  Electronically Signed     JAM/MEDQ  D:  06/08/2005  T:  06/08/2005  Job:  [id number]

## 2010-07-25 NOTE — H&P (Signed)
[name], [name]           ACCOUNT NO.:  [id number]   MEDICAL RECORD NO.:  [id number]          PATIENT TYPE:  INP   LOCATION:  1427                         [facility]ACILITY:  [facility]   PHYSICIAN:  Beckey Rutter, MD  DATE OF BIRTH:  [birth year]   DATE OF ADMISSION:  07/09/2006  DATE OF DISCHARGE:                              HISTORY & PHYSICAL   CHIEF COMPLAINT:  Chest pain.   HISTORY OF PRESENT ILLNESS:  This is 45 year old pleasant African  American female with past medical history significant for hypertension,  acid reflux disease and arrhythmia.  The patient presented to the  emergency room because of chest pain left-sided under her left breast,  pressure in nature, radiated to the left arm with no radiation to the  neck.  Pain is on and off in pattern.  The pain started this morning  while she was at home, not with activity.  The pain then eased and came  back and continued to do that for the whole day.  About 6 o'clock, the  pain was so severe and it was not going away so she presented herself to  the emergency room.  During that time, the pain is 10 out of 10.  Now  the patient received nitroglycerin with minimal relief and she received  morphine and she is feeling better now.  She said pain is associated  with sensation of difficulty breathing but actually there is no dyspnea.  For sometime, the patient felt heart racing/palpitation that worried her  and also convinced her to present herself to the emergency room.  She  denied nausea, vomiting, headache or fevers.  The patient had a similar  chest pain, though she stated this time it is worse and she was seen by  cardiologist about 2 years ago with [facility].  She could not  remember the spelling of the her cardiologist, but she stated that at  that time the patient underwent a stress test which was negative.  She  also remembers she had some cardiac workup which was negative as well at  that time.   FAMILY  HISTORY:  Family history is not significant.   SOCIAL HISTORY:  She denied drug abuse, tobacco abuse or ethanol abuse.   MEDICATION:  The patient noted taking metoprolol tartrate 50 mg once a  day and Carafate as well as omeprazole.   ALLERGIES:  She is not known to have drug allergies.   PAST MEDICAL HISTORY:  1. Significant for gastroesophageal reflux disease.  2. Hypertension.  3. Some palpitations.   REVIEW OF SYSTEMS:  Review of systems is unremarkable as per the HPI.   EXAM:  The patient was lying flat in bed not in acute distress.  VITAL:  Temperature is 97.8, blood pressure 121/61, respiratory rate 28,  pulse 63.  HEAD EXAMINATION:  Atraumatic, normocephalic.  EYES:  PERRL.  Mouth moist.  No ulcer.  NECK:  Supple.  No JVD.  CHEST:  Bilateral fair air entry.  PRECORDIAL EXAMINATION:  First and second heart sounds audible.  No  added sounds.  No murmur appreciated.  ABDOMEN:  Obese, soft,  nontender.  Bowel sounds present.  GU:  No CVA tenderness.  EXTREMITIES:  No edema.  NEUROLOGICAL EXAMINATION:  Alert and oriented x3, giving history and  moves all her extremities spontaneously.   LABS AND X-RAY:  White blood count 11.1, hemoglobin 11.9, hematocrit  36.0, MCV 79.7, platelet count 320.  Neutrophil 47%.  First set of  cardiac enzymes reading troponin less than 0.05, CK-MB less than one,  myoglobin 39.0.  Sodium 137, potassium 3.6, chloride 103, carbon dioxide  23, glucose 88, BUN 9, creatinine 0.69, calcium 8.5, total protein 6.9,  albumin 3.2, AST 17, ALT 9.  D-dimer less than 0.22.  Chest x-ray  showing no significant abnormality identified.  EKG reading sinus at  rate of 69.  There is no significant ST/T-wave abnormalities.   ASSESSMENT AND PLAN:  This is a 44 year old female with past medical  history significant for acid reflux disease, hypertension who presented  today with chest pain.  Clinically does not seem like cardiogenic in  origin.  She is still  complaining of pain and it has some worrisome  feature.  The plan is to admit the patient for further assessment and  management.  The patient will be continued on metoprolol 50 mg b.i.d.  The patient will have three cardiac enzymes.  We will have frequent  EKGs.  The patient will be given pain medication as of now.  The  patient's D-dimer is low and the possibility of pulmonary embolus is low  as well but we will continue to monitor pulse oximetry as per the vital  signs.  For deep vein thrombosis prophylaxis, we will consider Lovenox.  For her gastroesophageal reflux disease, we will put the patient on  Protonix b.i.d.      Beckey Rutter, MD  Electronically Signed     EME/MEDQ  D:  07/10/2006  T:  07/10/2006  Job:  [id number]

## 2010-07-25 NOTE — Discharge Summary (Signed)
NAMEBAMA, [name]           ACCOUNT NO.:  [id number]   MEDICAL RECORD NO.:  [id number]          PATIENT TYPE:  INP   LOCATION:  2019                         FACILITY:  [facility]   PHYSICIAN:  Hillery Aldo, M.D.   DATE OF BIRTH:  Oct 30, 1965   DATE OF ADMISSION:  07/05/2007  DATE OF DISCHARGE:  07/07/2007                               DISCHARGE SUMMARY   PRIMARY CARE PHYSICIAN:  Dr. Susann Givens.   DISCHARGE DIAGNOSES:  1. Noncardiac chest pain.  2. Hypertension.  3. Gastroesophageal reflux disease.  4. Obesity.  5. Obstructive sleep apnea.   Discharge medications  1. Toprol XL through the mg daily.  2. AcipHex 20 mg b.i.d.  3. Aspirin 81 mg daily.   CONSULTATIONS:  None.   BRIEF ADMISSION HISTORY OF PRESENT ILLNESS:  For full details, please  see the dictated report done by Dr. Ladona Ridgel.  Briefly, this 45 year old  female was brought to the hospital with recurrent episodes of chest  pain, palpitations, diaphoresis and nausea after a cardiac evaluation  done on July 01, 2007.  She re-presented for further evaluation and  workup.   PROCEDURES AND DIAGNOSTIC STUDIES:  1. Chest x-ray on July 05, 2007 was suboptimal inspiration with no      active disease.  Mild peribronchial thickening.  2. CT angiogram of the chest on July 05, 2007 was negative for      pulmonary embolism.  3. Abdominal ultrasound on July 06, 2007 showed no abdominal      pathology.   DISCHARGE LABORATORY VALUES:  Cardiac markers were negative x3 sets with  the exception of a nonspecific isolated troponin I elevation of 0.09.  TSH was 1.703.   HOSPITAL COURSE:  1. Atypical chest pain:  The patient's chest pain was nonspecific in      nature.  There was no evidence of acute coronary syndrome, aortic      dissection, pulmonary embolism based on diagnostic testing.  She      was admitted to the telemetry floor and cardiac markers were cycled      q.8 hours x3 sets which were negative with exception of an  isolated      troponin I value elevation as noted above.  She is put on aspirin,      oxycodone, and nitroglycerin for chest pain.  12-lead EKG tracings      were normal without any evidence of acute problems.  Of note, the      patient has been evaluated by [facility] with stress test back in December 2006 and stress test at      that time was negative.  It was felt that the patient's pain was      possibly abdominal in nature which prompted an abdominal      ultrasound, which also did not reveal any evidence of pathology.      The patient was encouraged to follow up with a gastroenterologist      for further evaluation should her pain return.  2. Hypertension:  The patient was maintained on Toprol XL with good  blood pressure control.  3. Gastroesophageal reflux disease:  The patient is maintained on      proton pump inhibitor therapy.   DISPOSITION:  The patient is deemed to be medically stable for discharge  on July 07, 2007.  She was advised to follow up with both her primary  care physician and with a gastroenterologist.      Hillery Aldo, M.D.  Electronically Signed     CR/MEDQ  D:  10/07/2007  T:  10/07/2007  Job:  [id number]   cc:   Sharlot Gowda, M.D.

## 2010-07-25 NOTE — H&P (Signed)
[facility]  Patient:    [name], [name] Visit Number: [id number] MRN: [id number]          Service Type: DSU Location: [facility] Attending Physician:  Wandalee Ferdinand Dictated by:   Rudy Jew Ashley Royalty, M.D. Admit Date:  08/25/2001                           History and Physical  Required in outpatient surgery at 7 a.m. on August 25, 2001.  HISTORY OF PRESENT ILLNESS:   This is a 45 year old, gravida 1, para 0, AB 1, who presented to me on January 21, 2001, complaining of right lower quadrant discomfort.  An ultrasound was obtained which revealed a fibroid uterus with the largest fibroid being 5.8 cm in greatest diameter and extending from the fundus.  In the right adnexa, there was an approximately 2 cm echoic emarginated cyst as well as a serpiginous stripe in right adnexa consistent with a hydrosalpinx.  The left adnexa was unremarkable.  The original ultrasound was performed February 10, 2001.   The patient returned April 07, 2001, for repeat ultrasound.  At that time, the fibroid was still present. The ovarian cyst was 1.7 cm in greatest diameter, and she continued to have the presence of the aforementioned serpiginous structure.  At that time, she elected expectant management.  However, she returned Jul 14, 2001, complaining of debilitating right lower quadrant discomfort for which she would like intervention.  Repeat ultrasound revealed the cyst to be larger at 2.4 cm ion greatest diameter.  The hydrosalpinx was again present as well.  The patient states she is not sexually active and does not want children.  However, she does not want any sterilization procedure performed per se.  MEDICATIONS:                  None.  PAST MEDICAL HISTORY:         Medical: Negative.  Surgical: Negative.  ALLERGIES:                    Negative.  FAMILY HISTORY:               Negative.  SOCIAL HISTORY:               She does not use tobacco or  significant alcohol.  REVIEW OF SYSTEMS:            Noncontributory.  PHYSICAL EXAMINATION:  GENERAL:                      Well-developed, well-nourished, pleasant female in no acute distress.  VITAL SIGNS:                  Afebrile.  Vital signs stable.  SKIN:                         Warm and dry without lesions.  NODES:                        There is no supraclavicular, cervical, or inguinal adenopathy.  HEENT:                        Normocephalic.  NECK:                         Supple  without thyromegaly.  CHEST:                        Lungs are clear.  CARDIAC:                      Regular rate and rhythm without murmurs, gallops, or rubs.  BREASTS:                      Exam deferred.  ABDOMEN:                      Soft, nontender without masses or organomegaly. Bowel sounds active.  MUSCULOSKELETAL:              Full range of motion without edema, cyanosis, or CVA tenderness.  PELVIC:                       Deferred to examination under anesthesia.  IMPRESSION:                   1. Polypoid uterus.                               2. Right adnexal cyst, persistent.  Differential                                  includes benign neoplasm, endometrioma, doubt                                  malignant neoplasm.                               3. Apparent right hydrosalpinx.                               4. Right lower quadrant discomfort probably                                  secondary to #2 and #3.  PLAN:                         I had a long discussion with the patient regarding the options for diagnosis and therapy.  She elects to proceed with diagnostic/operative laparoscopy with right salpingo-oophorectomy.  Risks, benefits, and alternatives fully discussed with the patient.  She states she understands the alternative of a more conservative procedure on the right side and agrees to the right adnexectomy.  She understands that, depending on the situation on the  left, this could possibly diminish her fertility and agrees to same.  She also understands the possibility of laparotomy and agrees to same.  Questions invited and answered. Dictated by:   Rudy Jew Ashley Royalty, M.D. Attending Physician:  Wandalee Ferdinand DD:  08/24/01 TD:  08/24/01 Job: [id number]

## 2010-07-25 NOTE — Op Note (Signed)
[name], MISSOU[place]           ACCOUNT NO.:  [id number]   MEDICAL RECORD NO.:  [id number]          PATIENT TYPE:  INP   LOCATION:  9318                          FACILITY:  [facility]   PHYSICIAN:  [name], M.D.DATE OF BIRTH:  [birth year]   DATE OF PROCEDURE:  06/08/2005  DATE OF DISCHARGE:                                 OPERATIVE REPORT   PREOPERATIVE DIAGNOSES:  1.  Status post total abdominal hysterectomy, left salpingo-oophorectomy.  2.  Status post right salpingo-oophorectomy in separate setting.  3.  Multiseptated cystic lesion in the pelvis - most likely representing      endometriosis.  4.  Pelvic pain.   POSTOPERATIVE DIAGNOSES:  1.  Status post total abdominal hysterectomy, left salpingo-oophorectomy.  2.  Status post right salpingo-oophorectomy in separate setting.  3.  Multiseptated cystic lesion in the pelvis - most likely representing      endometriosis.  4.  Pelvic pain.  5.  Pathology pending.  6.  Pelvic adhesions - extensive.   PROCEDURE:  Exploratory laparotomy with removal of pelvic mass and lysis of  adhesions (extensive).   SURGEON:  [name]. Ashley Royalty, MD   ASSISTANT:  Gina Dollar, MD   ANESTHESIA:  General.   ESTIMATED BLOOD LOSS:  100 mL.   COMPLICATIONS:  None.   PACKS AND DRAINS:  Foley.   SPONGE NEEDLE INSTRUMENT COUNT:  Reported as correct x2.   PROCEDURE:  The patient was taken to the operating room and placed in the  dorsal supine position.  After general anesthetic was administered, she was  prepped and draped in the usual manner for abdominal surgery.  Foley  catheter was placed.  A Pfannenstiel incision was made through the patient's  old skin incision which was excised.  The subcutaneous tissues were sharply  dissected down to the fascia, nicked with a knife and incised transversely  with Mayo scissors.  The underlying rectus muscles were separated in the  midline, exposing the peritoneum which was elevated with  hemostats and  entered atraumatically with Metzenbaum scissors.  The incision was extended  longitudinally.  As the incision was extended, it became apparent that there  were extensive adhesions involving the omentum to the anterior abdominal  wall.  In order to gain full entry into the abdominal cavity, the adhesions  had to be lysed.  Process was extensive and took more than 1 hour to  complete.  Once all the adhesions were lysed, the incision was extended  longitudinally enough to gain full access to the patient's pelvis.  Balfour  retractor was then placed.  The patient was placed in Trendelenburg position  and the upper abdomen packed off.  The pelvic mass was encountered, and  attempts were made to identify it.  As mentioned above, there was a  multicystic structure which was located in the pelvis.  It was not  immediately apparent whether it involved the GI tract or whether it was  simply adjacent to the sigmoid and rectal portion of the colon.  The sigmoid  colon and rectum were identified as best as the operator could determine,  the mass was  located anterior to the rectosigmoid, and therefore it was felt  reasonable to attempt to excise it.  The patient had already undergone a  bowel prep in preparation for the surgical procedure.   The multicystic mass was excised with Metzenbaum scissors using sharp and  blunt dissection.  It was submitted to pathology for histologic studies.  Careful attempts were made to examine the residual structures, and there was  no evidence of any trauma to the rectosigmoid colon per se.  The remainder  of the pelvis appeared to be normal for posthysterectomy and adnexectomy.  Hemostasis was noted.   At this point, the patient was felt to have benefited maximally from the  surgical procedure.  The packs were removed, and the Balfour retractor was  removed.  The peritoneum was closed with 3-0 Vicryl in a running fashion.  The fascia was closed with a 0  Vicryl in a running fashion.  The skin was  closed with staples.   The patient tolerated procedure extremely well and was returned to the  recovery room in good condition.  At the conclusion of the procedure, the  urine was clear and copious.      [name], M.D.  Electronically Signed     JAM/MEDQ  D:  06/08/2005  T:  06/08/2005  Job:  [id number]

## 2010-07-25 NOTE — Discharge Summary (Signed)
[name], [name]           ACCOUNT NO.:  [id number]   MEDICAL RECORD NO.:  [id number]          PATIENT TYPE:  INP   LOCATION:  1427                         FACILITY:  [facility]   PHYSICIAN:  Ladell Pier, M.D.   DATE OF BIRTH:  [birth year]   DATE OF ADMISSION:  07/09/2006  DATE OF DISCHARGE:  07/10/2006                               DISCHARGE SUMMARY   DISCHARGE DIAGNOSIS:  1. Chest pain with negative cardiac markers, negative D-dimer,      negative chest x-ray and EKG.      a.     Previous cardiac workup 2 years ago negative.  2. Gastroesophageal reflux disease.  3. Obesity.  4. Hypertension.  5. Mild hypokalemia.   DISCHARGE MEDICATIONS:  1. Metoprolol 50 mg daily.  2. Carafate daily.  3. Omeprazole daily.   CONSULTANTS:  None.   PROCEDURES:  None.   HISTORY OF PRESENT ILLNESS:  The patient is a 45 year old African  American female with a past medical history significant for hypertension  and GERD.  The patient presented to the emergency room with chest pain.  The patient states that the pain was more like a pressure radiating to  the left arm.  Pain was on and off.  The pain started in the morning  while she was at home without any activity.  See H&P for remainder of  the HPI, past medical history, family history, social history, meds,  allergies, review of systems per admission H&P.   PHYSICAL EXAMINATION ON DISCHARGE:  VITAL SIGNS:  Temperature 97.6,  pulse 66, respirations 18, blood pressure 100/55, pulse ox 98% on room  air.  HEENT:  Head is normocephalic, atraumatic.  Pupils equal, round,  reactive to light.  Throat without erythema.  CARDIOVASCULAR:  Regular rate and rhythm.  LUNGS:  Clear bilaterally.  ABDOMEN:  Positive bowel sounds.  EXTREMITIES:  Without edema.   HOSPITAL COURSE:  1. Chest pain.  The patient was admitted to the hospital.  She had      serial enzymes done that were negative.  Chest x-ray was normal.  D-      dimer negative.  Vital  signs remained stable.  Her chest pain      resolved.  She had a stress test done per the patient less than 2      years ago.  Told her to follow up with her primary care physician      this week and possible referral back to cardiology by her primary      care physician for chest pain.  2. GERD.  Continue her on her PPI.  3. Obesity.  Encouraged diet and exercise.  4. Hypokalemia.  The patient received 40 mEq of K prior to discharge.   DISCHARGE LABORATORY:  CK 76, CK-MB 0.42, troponin 0.02.  CK 76, MB 0.4.  BMP:  Sodium 135, potassium 3.4, chloride 104, CO2 24, glucose 81, BUN  9, creatinine less than 0.3.  D-dimer less than 0.22.  BNP:  WBC 11.1,  hemoglobin 11.9, platelets 320.  LFTs normal.      Ladell Pier, M.D.  Electronically Signed  NJ/MEDQ  D:  07/10/2006  T:  07/10/2006  Job:  [id number]

## 2010-07-25 NOTE — H&P (Signed)
[name], [name]                       ACCOUNT NO.:  [id number]   MEDICAL RECORD NO.:  [id number]                   PATIENT TYPE:  AMB   LOCATION:  SDC                                  FACILITY:  [facility]   PHYSICIAN:  James A. Ashley Royalty, M.D.             DATE OF BIRTH:  07-24-1965   DATE OF ADMISSION:  DATE OF DISCHARGE:                                HISTORY & PHYSICAL   DATE OF ADMISSION:  May 16, 2003   HISTORY OF PRESENT ILLNESS:  This is a 45 year old gravida 1 para 0 AB 1 who  presented to me for annual examination March 20, 2003.  She stated that  she went to [facility] November 2004 secondary to back discomfort.  An MRI  revealed a left ovarian cyst.  She subsequently had an ultrasound at  [facility] approximately 2 weeks prior to the March 20, 2003  encounter.  Allegedly the ultrasound confirmed the same diagnosis.  The  patient also complains of an concomitant left lower quadrant discomfort.  The report from [facility] was obtained on December 30 and it  showed a 3-4 cm left adnexal cyst.  The patient is status post total  abdominal hysterectomy as well as right salpingo-oophorectomy in separate  settings.  She returned April 26, 2003 for a repeat ultrasound and the  study revealed two left adnexal cysts of 3.1 and 2.4 cm in greatest diameter  respectively.  She is hence for diagnostic/operative laparoscopy for removal  of the left adnexal cyst or cysts.   MEDICATIONS:  None.   PAST MEDICAL HISTORY:  1. Medical:  Negative.  2. Surgical:  As above.   ALLERGIES:  None.   FAMILY HISTORY:  Noncontributory.   SOCIAL HISTORY:  The patient denies use of tobacco or significant alcohol.   REVIEW OF SYSTEMS:  Noncontributory.   PHYSICAL EXAMINATION:  GENERAL:  Well-developed, well-nourished, pleasant  black female in no acute distress.  VITAL SIGNS:  Afebrile, vital signs stable.  SKIN:  Warm and dry without lesions.  LYMPHATIC:  There  is no supraclavicular, cervical, or inguinal adenopathy.  HEENT:  Normocephalic.  NECK:  Supple without thyromegaly.  CHEST:  Lungs are clear.  CARDIAC:  Regular rate and rhythm without murmurs, gallops, or rubs.  BREAST:  Deferred.  ABDOMEN:  Soft, nontender, without masses or organomegaly.  Bowel sounds are  active.  MUSCULOSKELETAL:  Reveals full range of motion without any cyanosis or CVA  tenderness.  PELVIC:  (March 20, 2003) External genitalia within normal limits.  Vagina  is without gross lesion.  Vaginal cuff is well healed.  Bimanual examination  reveals surgical absence of the uterus.  I could appreciate no adnexal  masses.  Rectovaginal examination was deferred.   IMPRESSION:  1. Status post total abdominal hysterectomy.  2. Left adnexal cyst or cysts.  3. Left lower quadrant discomfort - probably secondary to #2.   PLAN:  Diagnostic/operative laparoscopy with left  ovarian cystectomy or  cystectomies.  Risks, benefits, complications, and alternatives fully  discussed with the patient.  The possibility of left salpingo-oophorectomy  discussed and accepted.  In the event of complete removal the patient  understands she would become a candidate for hormonal replacement therapy.  Risks, benefits, complications, and alternatives fully discussed with the  patient.  She states she understands and accepts.  Questions invited and  answered.  She also understands the possible need for laparotomy.                                               James A. Ashley Royalty, M.D.    JAM/MEDQ  D:  05/16/2003  T:  05/16/2003  Job:  [id number]

## 2010-07-25 NOTE — Op Note (Signed)
[name], [name]                       ACCOUNT NO.:  [id number]   MEDICAL RECORD NO.:  [id number]                   PATIENT TYPE:  INP   LOCATION:  9313                                 FACILITY:  [facility]   PHYSICIAN:  James A. Ashley Royalty, M.D.             DATE OF BIRTH:  1965/03/20   DATE OF PROCEDURE:  05/16/2003  DATE OF DISCHARGE:                                 OPERATIVE REPORT   PREOPERATIVE DIAGNOSES:  1. Left adnexal cyst (S) - persistent.  Differential includes benign ovarian     neoplasm, endometriosis, doubt malignant neoplasm, physiologic.  2. Left lower quadrant discomfort.   POSTOPERATIVE DIAGNOSES:  1. Left adnexal cyst (S) - persistent.  Differential includes benign ovarian     neoplasm, endometriosis, doubt malignant neoplasm, physiologic.  Path     pending.  2. Adhesions involving the omentum, left colon, left adnexa, and cul-de-sac.   PROCEDURES:  1. Diagnostic laparoscopy.  2. Exploratory laparotomy.  3. Lysis of adhesions.  4. Left salpingo-oophorectomy.   SURGEON:  Rudy Jew. Ashley Royalty, M.D.   ANESTHESIA:  General endotracheal anesthesia.   ESTIMATED BLOOD LOSS:  200 mL.   COMPLICATIONS:  None.   PACKS AND DRAINS:  Foley.   Sponge, needle and instrument counts were reported as correct x2.   DESCRIPTION OF PROCEDURE:  The patient was taken to the operating room after  placing in the dorsal supine position.  After adequate general anesthesia  was administered.  She was placed in the lithotomy position, prepped and  draped in the usual manner for abdominal and vaginal surgery.  Bladder was  drained with a red rubber catheter.  After instilling approximately 3 mL of  0.25% bupivacaine in the infraumbilical area, a longitudinal infraumbilical  incision was made approximately 1.5 cm.  The standard disposable  laparoscopic trocar size 10-11 was placed in the abdominal cavity without  difficulty.  Its location was verified by placement of laparoscope.   There  was no evidence of any trauma.  Pneumoperitoneum was created with CO2 and  maintained throughout the laparoscopy.  Next, two 5 mm suprapubic trocars  were placed in the left and right lower quadrants using the patient's old  skin incisions.  Transillumination and direct visualization techniques were  employed.  At this point, the pelvis was thoroughly inspected.  The uterus  and right adnexa were surgically absent. The omentum was located in the left  lower quadrant and the left adnexa was not initially visualized. The omentum  was noted to be quite adherent to the cul-de-sac, peritoneum, left pelvic  sidewall, left adnexa, and also the left colon.  An attempt was made  initially to lyse some of the adhesions, particularly in the cul-de-sac.  These efforts were successful.  However, as the left adnexa was brought into  view, it became apparent that the entire adnexa could not be dissected free  from the left colon and the omentum without substantial danger of  bowel  injury.  Hence, the decision was made at this point to abandon the remainder  of the laparoscopy and to proceed with laparotomy.  The abdominal  instruments were removed.  Pneumoperitoneum evacuated.  The superior  incision was closed with 0 Vicryl in interrupted fashion.  Skin was closed  with 3-0 Monocryl in a subcuticular fashion.   Preparations were then made for laparotomy.  Foley catheter was placed.  The  patient was reprepped and draped and placed in the supine position.  A  Pfannenstiel incision was made down to the patient's old skin incision  involving the subcutaneous layers.  The fascia was nicked with a knife and  incised transversely with Mayo scissors.  The rectus muscles were separated  using sharp and blunt dissection from the overlying fascia.  The rectus  muscles were separated in the midline exposing the peritoneum.  It was  entered with Metzenbaum scissors in an atraumatic fashion.  Incision was   extended longitudinally.  O'Connor-O'Sullivan retractor was placed through  the abdominal incision.  Appropriate retraction employed.  The upper abdomen  was explored.  I could appreciate two kidneys.  I could appreciate no para-  aortic adenopathy.  The liver surfaces and peritoneal surfaces were smooth  and glistening.  At this point, the upper abdomen was packed off and the  appropriate O'Connor-O'Sullivan  blades were placed in order to maximize  exposure.  The left adnexa was once again identified.  The adhesions of the  omentum and left colon to the left adnexa were gradually taken down using  Metzenbaum scissors using great care.  It should be mentioned that prior to  completing the laparoscopy, the peritoneal washings were obtained with a  Nezhat suction irrigator.  As the adhesions were lysed, the adnexa was  brought into full view.  Several of the cysts had been opened during the  dissection.  The left broad ligament was opened and the ureter identified  well below the plane of dissection.  The left infundibulopelvic ligament was  isolated, clamped, cut and doubly secured with #1 chromic catgut.  The  remainder of the attachments to the left tube and ovary were sized and the  pedicles secured with 0 chromic catgut in interrupted fashion.  Specimen was  then released and submitted to pathology for histologic studies.  Copious  irrigation was accomplished.  Hemostasis was noted.  There was no evidence  of any trauma.  Hemostasis was noted.   The rectus muscles were then reapproximated in the midline using 0 chromic  catgut. The fascia was closed with 0 chromic catgut.  Copious irrigation was  accomplished.  Hemostasis was noted.  The skin was then closed with staples.   The patient tolerated the procedure extremely well.  She was returned to the  recovery room in good condition.                                               James A. Ashley Royalty, M.D.   JAM/MEDQ  D:  05/16/2003   T:  05/17/2003  Job:  [id number]

## 2010-07-25 NOTE — Op Note (Signed)
[name], [name]                       ACCOUNT NO.:  [id number]   MEDICAL RECORD NO.:  [id number]                   PATIENT TYPE:  OUT   LOCATION:  XRAY                                 FACILITY:  [facility]   PHYSICIAN:  Anselmo Rod, M.D.               DATE OF BIRTH:  01/03/1966   DATE OF PROCEDURE:  03/23/2003  DATE OF DISCHARGE:                                 OPERATIVE REPORT   PROCEDURE:  Colonoscopy.   ENDOSCOPIST:  Anselmo Rod, M.D.   INSTRUMENT USED:  Olympus video colonoscope.   INDICATIONS FOR PROCEDURE:  A 45 year old [place] female with a  history of  iron deficiency anemia undergoing a colonoscopy, rule out  colonic polyps, masses, etc.   PREPROCEDURE PREPARATION:  Informed consent was obtained from the patient  and the patient was  fasted for 8 hours prior to  the procedure and prepped  with a bottle of magnesium citrate and a gallon of GoLYTELY the  night prior  to the procedure.   PREPROCEDURE PHYSICAL:  The patient had stable vital signs. Neck supple.  Chest clear to auscultation, S1, S2 regular. Abdomen soft with normoactive  bowel sounds.   DESCRIPTION OF PROCEDURE:  The patient was placed in the left lateral  decubitus position and sedated with 80 mg of Demerol and 8 mg of Versed  intravenously. Once sedation was adequate, the patient was maintained on low  flow oxygen and continuous cardiac monitoring.   The Olympus video colonoscope was advanced into the rectum to the cecum. No  masses, polyps, erosions or ulcerations or diverticula were seen.  The  appendiceal orifice and ileocecal valve were clearly visualized and  photographed. Small internal hemorrhoids were appreciated on retroflex  in  the rectum. No definite source of bleeding could be identified.   The patient tolerated the procedure well without complications. There was  mild extrinsic compression of her colon at 100 cm. The reasons for this were  not clear.   IMPRESSION:  1. Essentially normal colonoscopy up to the cecum except for small  internal     hemorrhoids.  2. No masses or polyps seen.  3. Questionable extrinsic compression of the colon at 100 cm.   RECOMMENDATIONS:  1. Upper GI with small bowel follow through has been scheduled for the     patient to complete her evaluation with regards to her iron deficiency     anemia.  2. A CBC will be checked on her next outpatient visit and further     recommendations will be made as needed.  3. Possible CT scan of the abdomen to further evaluate extrinsic compression     of the colon. Further recommendations will be made thereafter.  Anselmo Rod, M.D.    JNM/MEDQ  D:  03/24/2003  T:  03/24/2003  Job:  [id number]   cc:   Gabriel Earing, M.D.  [address]  [place]  [place] [address]  Fax: [phone]

## 2010-07-25 NOTE — H&P (Signed)
[name], [name]           ACCOUNT NO.:  [id number]   MEDICAL RECORD NO.:  [id number]          PATIENT TYPE:  INP   LOCATION:  NA                            [facility]   PHYSICIAN:  James A. Ashley Royalty, M.D.DATE OF BIRTH:  1966-01-20   DATE OF ADMISSION:  DATE OF DISCHARGE:                                HISTORY & PHYSICAL   CHIEF COMPLAINT:  This is a 45 year old, gravida 1, para 0, AB 1 with the  chief complaint of pelvic mass.   HISTORY OF PRESENT ILLNESS:  The patient's history dates back to  approximately 2003.  In June of that year, she had a laparoscopy with a  right salpingo-oophorectomy.  On October 05, 2001, she also had a total  abdominal hysterectomy.  On November 11, 2001, she had a CT scan of the  abdomen and pelvis which were reported as negative.  On February 09, 2003,  she had an ultrasound which revealed a 3.4 cm left adnexal cyst.  On  April 26, 2003, she had an ultrasound which revealed two left adnexal  cysts, 3.1 and 2.4 cm in greatest diameter respectively.  On May 16, 2003,  she had an exploratory laparotomy with a left salpingo-oophorectomy.  On Jul 13, 2003, she had a CT scan of the abdomen and pelvis read by a Dr. Si Gaul, which  revealed a 6.5 x 6 cm complex cystic lesion.  Recommendation was for  repeat ultrasound.  Ultrasound was performed August 13, 2003 and read by Dr.  Deboraha Sprang.  The diagnosis was a possible peritoneal inclusion cyst or less  likely ovarian remnant.  On April 24, 2004, the patient had another CT  scan read by Dr. Si Gaul which revealed a 6.3 x 4.5 cm cystic and solid  structure within the pelvis.  There was no differential diagnosis provided  by this physician.  On June 29, 2004, an ultrasound was obtained and read  by Dr. Jean Rosenthal.  He did provide a differential diagnosis which included a  peritoneum inclusion cyst, endometriosis, or possible ovarian remnant.  More  recently, in followup, ultrasound was obtained on Jul 30, 2004 and read  by  Dr. Kyung Rudd who specifically stated the findings were not compatible with a  peritoneal inclusion cyst.  She went on to say that an endometrioma or  nongynecologic etiology would need to be considered as well.  A neoplastic  process could not be excluded, however.  Such appeared unlikely based on the  fact that over time the mass has not enlarged.  I went over and talked with  Dr. Kyung Rudd in person about the lack of agreement in the various reports.  She mentioned she intentionally used language in her report to distance  herself from the previous reports since she did not agree with at all that  the appearance of this mass was consistent with an inclusion cyst.  She did  suggest getting an MRI of the pelvis as endometriosis was a distinct  possibility.  Hence, an MRI of the pelvis was performed September 07, 2004 and  revealed a multiseptate cystic lesion of the left adnexa.  Most likely  differential diagnosis was that of endometriosis.  The report was read by  Dr. Pecolia Ades and was also seen by Dr. Kyung Rudd and she concurred with the  findings.   The above findings were conveyed to the patient who agreed in concert with  Dr. Pecolia Ades and Dr. Kyung Rudd and myself to repeat the MRI in approximately  four months in July 2006.  In November 2006, the patient returned  complaining of pelvic pain.  In an effort to follow this up, the pelvic MRI  was repeated in November 2006.  The report was read by Dr. Pecolia Ades.  The  report suggests that the process appeared to be related to endometriosis and  that there were no worrisome components to the study.  He suggested  repeating the MRI approximately July 2007.   The patient returned for annual examination April 20, 2005 complaining of  increasing pain in the left lower quadrant.  She stated it seemed to last  approximately three hours at a time.  She denied any associated fever,  anorexia, nausea or vomiting.  She takes Tylenol and/or ibuprofen  with  partial relief.  The pain is somewhat debilitating and the patient requests  surgical intervention.   MEDICATIONS:  Metoprolol for elevated heart rate and Zegerid for acid  reflux.   PAST MEDICAL HISTORY:  1.  Rapid heart beat, treated by Dr. Alanda Amass.  2.  GERD treated by Dr. Chip Boer.   PAST SURGICAL HISTORY:  See above.   ALLERGIES:  None.   FAMILY HISTORY:  Noncontributory.   SOCIAL HISTORY:  The patient denied use of tobacco or significant alcohol.   REVIEW OF SYSTEMS:  Noncontributory.   PHYSICAL EXAMINATION:  GENERAL:  Well-developed, well-nourished, pleasant,  black, obese female in no acute distress.  VITAL SIGNS: Afebrile.  Vital signs stable.  CHEST:  Lungs are clear.  CARDIAC:  Regular rate and rhythm.  ABDOMEN:  Soft, nontender without masses or organomegaly.  There is a well-  healed surgical scar located in the transverse plane.  MUSCULOSKELETAL:  No CVA tenderness.  PELVIC:  External genitalia within normal limits.  Vagina is without gross  lesions.  Vaginal cuff is well-healed.  Bimanual examination reveals  surgically absence of the uterus.  I can appreciate no obvious adnexal  masses at all.  Rectovaginal examination confirms.   IMPRESSION:  1.  Status post total abdominal hysterectomy, left salpingo-oophorectomy.  2.  Status post right salpingo-oophorectomy in separate setting.  3.  Multiseptated cystic lesion in the left adnexa, most likely related to      endometriosis.  4.  History of rapid heart beat.  Treated by Dr. Alanda Amass.  5.  Gastroesophageal reflux disease, treated per Dr. Chip Boer.  6.  Pelvic pain, probably secondary to #3.   PLAN:  Exploratory laparotomy with removal of pelvis mass.  The risks,  benefits, complications, and alternatives were fully discussed with the  patient.  She understands there is a remote possibility of surgery on the GI tract and agrees to undergo a bowel prep in preparation for this procedure.  Questions  invited and answered.  Incidentally, the patient had a CA125  obtained May 2006 and the value was 5.4 units per mL.  Please note that a  portion of the evaluation for this dictation was performed in the outpatient  setting.      James A. Ashley Royalty, M.D.  Electronically Signed     JAM/MEDQ  D:  06/07/2005  T:  06/08/2005  Job:  [id number]

## 2010-07-25 NOTE — Discharge Summary (Signed)
NAMESUMAYA, [name]                       ACCOUNT NO.:  [id number]   MEDICAL RECORD NO.:  [id number]                   PATIENT TYPE:  INP   LOCATION:  9313                                 FACILITY[facility]   PHYSICIAN:  James A. Ashley Royalty, M.D.             DATE OF BIRTH:  [birth year]   DATE OF ADMISSION:  05/16/2003  DATE OF DISCHARGE:  05/19/2003                                 DISCHARGE SUMMARY   DISCHARGE DIAGNOSES:  1. Benign hemorrhagic cyst of the left ovary.  2. Left paratubal cyst.  3. Left lower quadrant discomfort.   OPERATION/PROCEDURE:  1. Diagnostic laparoscopy.  2. Exploratory laparotomy.  3. Lysis of adhesions.  4. Left salpingo-oophorectomy.   CONSULTATIONS:  None.   DISCHARGE MEDICATIONS:  Lorcet Plus, 5L. 0.05.   HISTORY AND PHYSICAL:  This is a 45 year old, gravida 1, para 0, A1, for  diagnostic/operative laparoscopy for removal of left adnexal cysts. For the  remainder of the history and physical, please see chart.   HOSPITAL COURSE:  The patient is brought in for diagnostic laparoscopy as an  outpatient. She was taken to the operating room on May 16, 2003, for same.  During the course of the procedure it became readily apparent that the  patient had far too much in the way of pelvic adhesions in order to  accomplish the procedure laparoscopically. Hence, the procedure was  converted to a laparotomy. She underwent lysis of adhesions as well as left  salpingo-oophorectomy. Because the procedure was a laparotomy, the patient  was admitted postoperatively for pain control and general recuperation. Her  postoperative course was benign. She was discharged on the third  postoperative day afebrile and satisfactory condition.   CLINICAL FINDINGS:  Hemoglobin and hematocrit on admission were 11.1/33.8  respectively. Repeat values obtained on May 19, 2003, were 9.2 and 28.4  respectively. Pathology results were as mentioned above.   DISPOSITION:  The patient  is to return to [facility]  in four to six weeks for postoperative appointment.                                               James A. Ashley Royalty, M.D.    JAM/MEDQ  D:  06/06/2003  T:  06/06/2003  Job:  [id number]

## 2010-07-25 NOTE — H&P (Signed)
NAMEMEKAYLA, [name]           ACCOUNT NO.:  [id number]   MEDICAL RECORD NO.:  [id number]          PATIENT TYPE:  INP   LOCATION:  0104                         FACILITY:  [facility]   PHYSICIAN:  Kela Millin, M.D.DATE OF BIRTH:  [birth year]   DATE OF ADMISSION:  02/02/2005  DATE OF DISCHARGE:                                HISTORY & PHYSICAL   PRIMARY CARE PHYSICIAN:  Unassigned.   CHIEF COMPLAINT:  Chest pain.   HISTORY OF PRESENT ILLNESS:  The patient  is a 45 year old, obese black  female, status post hysterectomy with BSO, mitral valve prolapse, who  presents with complaints of chest pain for about six hours.  She states that  the pain has two components to it.  A sharp pain that has been intermittent,  lasting about five minutes at a time with initially relieved by Tylenol, an  8/10 in intensity.  She also has a heaviness that has been constant, dull.  The patient also states that the pain has been radiating down her left arm.  She denies nausea, vomiting, shortness of breath, also denies diaphoresis.  Mrs. Soliday also denies cough, fever, dysuria, hematemesis, hematochezia,  melena, and no diarrhea.  The patient also reported that she has had some  pain in her right leg, denied swelling although she stated that she felt  some fullness in that leg.  The patient also denies shortness of breath.   She was seen in the emergency room and a D-dimer was within normal limits.  Chest x-ray showed no acute disease and her point-of-care enzymes were  negative.  She is admitted to the [facility] for further  evaluation and management.   PAST MEDICAL HISTORY:  As stated above.   MEDICATIONS:  Tylenol p.r.n.   ALLERGIES:  No known drug allergies.   FAMILY HISTORY:  Her grandmother had hypertension.   SOCIAL HISTORY:  Denies tobacco.  Also denies alcohol.   REVIEW OF SYSTEMS:  As per HPI.  Other review of systems negative.   PHYSICAL EXAMINATION:  GENERAL:   The patient is an obese, middle-aged black  female, pleasant, in no apparent distress.  VITAL SIGNS:  Temperature 98.  Blood pressure 149/95, pulse 75, respiratory  rate 14, oxygen saturation 99%.  HEENT:  PERRL.  EOMI.  Sclerae anicteric.  No oral exudates.  NECK:  Supple.  No adenopathy.  No JVD.  No thyromegaly appreciated.  CARDIOVASCULAR:  Regular rate and rhythm.  Normal S1 and S2.  ABDOMEN:  Soft.  Bowel sounds present.  Nontender.  Nondistended.  No masses  palpable.  EXTREMITIES:  Mild right calf tenderness to palpation.  No edema.  No  erythema.  No cyanosis.  Left lower extremity within normal limits.  NEUROLOGIC:  Alert and oriented x3.  Cranial nerves II-XII grossly intact.  Nonfocal exam.   LABORATORY DATA:  D-dimer is less than 0.22.  Point-of-care enzymes  negative.  Sodium 137, potassium 3.8, chloride 103, CO2 26, glucose 91, BUN  12, creatinine 0.6, calcium 8.7. White cell count 9.5 with a hemoglobin of  11.6, hematocrit 34.8, platelet count 360.  Chest x-ray shows  no acute  disease.  EKG shows normal sinus rhythm with a rate of 75.  LVH by voltage  criteria.   ASSESSMENT/PLAN:  Problem #1.  Chest pain.  Will obtain serial cardiac  enzymes and EKG in the a.m.  Will start aspirin and nitrates p.r.n.  Also  Protonix to cover for possible GI.  Will obtain fasting lipid profile in the  a.m.  Cardiac consultation if appropriate, depending on enzymes for risk  stratification.   Problem #2.  Right leg pain.  Obtained lower extremity Doppler ultrasound.  She is at risk for deep venous thrombosis.  D-dimer negative as above.  Follow and treat as appropriate.   Problem #3.  Obesity.           ______________________________  Kela Millin, M.D.     ACV/MEDQ  D:  02/03/2005  T:  02/03/2005  Job:  [id number]

## 2010-10-15 ENCOUNTER — Other Ambulatory Visit: Payer: Self-pay | Admitting: Obstetrics

## 2010-10-15 DIAGNOSIS — Z1231 Encounter for screening mammogram for malignant neoplasm of breast: Secondary | ICD-10-CM

## 2010-10-24 ENCOUNTER — Ambulatory Visit
Admission: RE | Admit: 2010-10-24 | Discharge: 2010-10-24 | Disposition: A | Discharge status: Home / Self Care | Payer: BC Managed Care – PPO | Source: Ambulatory Visit | Attending: Obstetrics | Admitting: Obstetrics

## 2010-10-24 DIAGNOSIS — Z1231 Encounter for screening mammogram for malignant neoplasm of breast: Secondary | ICD-10-CM

## 2010-11-10 ENCOUNTER — Emergency Department (HOSPITAL_COMMUNITY)
Admission: EM | Admit: 2010-11-10 | Discharge: 2010-11-10 | Disposition: A | Discharge status: Home / Self Care | Payer: BC Managed Care – PPO | Attending: Emergency Medicine | Admitting: Emergency Medicine

## 2010-11-10 DIAGNOSIS — S93609A Unspecified sprain of unspecified foot, initial encounter: Secondary | ICD-10-CM | POA: Insufficient documentation

## 2010-11-10 DIAGNOSIS — I10 Essential (primary) hypertension: Secondary | ICD-10-CM | POA: Insufficient documentation

## 2010-11-10 DIAGNOSIS — X58XXXA Exposure to other specified factors, initial encounter: Secondary | ICD-10-CM | POA: Insufficient documentation

## 2010-11-10 DIAGNOSIS — M25579 Pain in unspecified ankle and joints of unspecified foot: Secondary | ICD-10-CM | POA: Insufficient documentation

## 2010-12-02 LAB — CBC
HCT: 34.4 — ABNORMAL LOW
Hemoglobin: 11.8 — ABNORMAL LOW
Platelets: 295
RBC: 4.12
RDW: 14.1
RDW: 14.4
WBC: 10.7 — ABNORMAL HIGH

## 2010-12-02 LAB — CARDIAC PANEL(CRET KIN+CKTOT+MB+TROPI)
CK, MB: 0.5
CK, MB: 0.5
Relative Index: INVALID
Total CK: 70
Total CK: 74
Troponin I: <0.01

## 2010-12-02 LAB — POCT CARDIAC MARKERS
CKMB, poc: <1 — ABNORMAL LOW
CKMB, poc: <1 — ABNORMAL LOW
CKMB, poc: <1 — ABNORMAL LOW
Myoglobin, poc: 31.3
Myoglobin, poc: 41.1
Operator id: 146091
Operator id: 222501
Troponin i, poc: 0.09 — ABNORMAL HIGH
Troponin i, poc: <0.05
Troponin i, poc: <0.05

## 2010-12-02 LAB — DIFFERENTIAL
Basophils Absolute: 0.1
Basophils Absolute: 0.2 — ABNORMAL HIGH
Eosinophils Relative: 2
Eosinophils Relative: 3
Lymphocytes Relative: 33
Lymphocytes Relative: 33
Lymphs Abs: 3.5
Lymphs Abs: 3.9
Monocytes Absolute: 0.9
Monocytes Absolute: 1
Monocytes Relative: 9
Neutro Abs: 5.8

## 2010-12-02 LAB — BASIC METABOLIC PANEL
GFR calc non Af Amer: >60
Glucose, Bld: 79
Potassium: 3.8
Sodium: 134 — ABNORMAL LOW

## 2010-12-02 LAB — COMPREHENSIVE METABOLIC PANEL
AST: 14
Albumin: 3 — ABNORMAL LOW
BUN: 7
Calcium: 8.5
Creatinine, Ser: 0.59
GFR calc Af Amer: >60
GFR calc non Af Amer: >60
Total Bilirubin: 0.7

## 2010-12-02 LAB — FOLATE: Folate: 10.6

## 2010-12-02 LAB — CK TOTAL AND CKMB (NOT AT ARMC)
CK, MB: 0.5
Relative Index: INVALID

## 2010-12-02 LAB — TROPONIN I: Troponin I: 0.01

## 2010-12-02 LAB — TSH: TSH: 1.703

## 2010-12-02 LAB — MAGNESIUM: Magnesium: 1.8

## 2010-12-08 LAB — CBC
HCT: 38.9
MCV: 84.5
Platelets: 312
RDW: 14.6
WBC: 11.1 — ABNORMAL HIGH

## 2010-12-08 LAB — POCT I-STAT, CHEM 8
BUN: 10
Calcium, Ion: 1.15
Chloride: 106
Creatinine, Ser: 0.7
Glucose, Bld: 106 — ABNORMAL HIGH
HCT: 40
Hemoglobin: 13.6
Potassium: 3.8
Sodium: 140
TCO2: 23

## 2010-12-08 LAB — POCT CARDIAC MARKERS
CKMB, poc: <1 — ABNORMAL LOW
CKMB, poc: <1 — ABNORMAL LOW
Myoglobin, poc: 26.7
Myoglobin, poc: 35.2
Troponin i, poc: <0.05
Troponin i, poc: <0.05

## 2010-12-08 LAB — DIFFERENTIAL
Basophils Absolute: 0.1
Eosinophils Absolute: 0.3
Eosinophils Relative: 3
Lymphocytes Relative: 31
Lymphs Abs: 3.5
Neutrophils Relative %: 58

## 2010-12-08 LAB — D-DIMER, QUANTITATIVE

## 2010-12-15 LAB — POCT CARDIAC MARKERS
CKMB, poc: <1 — ABNORMAL LOW
Myoglobin, poc: 61.1
Troponin i, poc: <0.05

## 2010-12-15 LAB — CBC
Hemoglobin: 12.6
MCHC: 33.4
RBC: 4.61
WBC: 10.3

## 2010-12-15 LAB — DIFFERENTIAL
Basophils Relative: 1
Lymphocytes Relative: 36
Monocytes Relative: 8
Neutro Abs: 5.5
Neutrophils Relative %: 53

## 2010-12-15 LAB — I-STAT 8, (EC8 V) (CONVERTED LAB)
BUN: 7
Chloride: 107
HCT: 43
Hemoglobin: 14.6
Operator id: 198171
Potassium: 3.7
Sodium: 139
pCO2, Ven: 43.2 — ABNORMAL LOW

## 2010-12-15 LAB — POCT I-STAT CREATININE: Creatinine, Ser: 0.8

## 2010-12-16 LAB — BASIC METABOLIC PANEL
BUN: 13
Calcium: 9.1
GFR calc non Af Amer: >60
Glucose, Bld: 92
Sodium: 137

## 2010-12-16 LAB — D-DIMER, QUANTITATIVE: D-Dimer, Quant: <0.22

## 2010-12-16 LAB — POCT CARDIAC MARKERS: Myoglobin, poc: 53

## 2011-01-08 HISTORY — PX: KNEE ARTHROSCOPY: SUR90

## 2011-01-09 ENCOUNTER — Encounter (HOSPITAL_BASED_OUTPATIENT_CLINIC_OR_DEPARTMENT_OTHER): Payer: Self-pay | Admitting: *Deleted

## 2011-01-12 ENCOUNTER — Other Ambulatory Visit (HOSPITAL_BASED_OUTPATIENT_CLINIC_OR_DEPARTMENT_OTHER): Payer: BC Managed Care – PPO

## 2011-01-12 ENCOUNTER — Encounter (HOSPITAL_BASED_OUTPATIENT_CLINIC_OR_DEPARTMENT_OTHER): Payer: Self-pay | Admitting: *Deleted

## 2011-01-12 LAB — BASIC METABOLIC PANEL WITH GFR
BUN: 7 mg/dL (ref 6–23)
CO2: 27 meq/L (ref 19–32)
Calcium: 9.4 mg/dL (ref 8.4–10.5)
Chloride: 103 meq/L (ref 96–112)
Creatinine, Ser: 0.54 mg/dL (ref 0.50–1.10)
GFR calc Af Amer: >90 mL/min
GFR calc non Af Amer: >90 mL/min
Glucose, Bld: 105 mg/dL — ABNORMAL HIGH (ref 70–99)
Potassium: 4.1 meq/L (ref 3.5–5.1)
Sodium: 140 meq/L (ref 135–145)

## 2011-01-14 ENCOUNTER — Encounter (HOSPITAL_BASED_OUTPATIENT_CLINIC_OR_DEPARTMENT_OTHER): Payer: Self-pay | Admitting: Anesthesiology

## 2011-01-14 ENCOUNTER — Encounter (HOSPITAL_BASED_OUTPATIENT_CLINIC_OR_DEPARTMENT_OTHER): Payer: Self-pay | Admitting: *Deleted

## 2011-01-14 ENCOUNTER — Ambulatory Visit (HOSPITAL_BASED_OUTPATIENT_CLINIC_OR_DEPARTMENT_OTHER)
Admission: RE | Admit: 2011-01-14 | Discharge: 2011-01-14 | Disposition: A | Discharge status: Home / Self Care | Payer: BC Managed Care – PPO | Source: Ambulatory Visit | Attending: Orthopedic Surgery | Admitting: Orthopedic Surgery

## 2011-01-14 ENCOUNTER — Ambulatory Visit (HOSPITAL_BASED_OUTPATIENT_CLINIC_OR_DEPARTMENT_OTHER): Payer: BC Managed Care – PPO | Admitting: Anesthesiology

## 2011-01-14 ENCOUNTER — Encounter (HOSPITAL_BASED_OUTPATIENT_CLINIC_OR_DEPARTMENT_OTHER): Admission: RE | Payer: Self-pay | Source: Ambulatory Visit

## 2011-01-14 ENCOUNTER — Ambulatory Visit (HOSPITAL_BASED_OUTPATIENT_CLINIC_OR_DEPARTMENT_OTHER)
Admission: RE | Admit: 2011-01-14 | Payer: BC Managed Care – PPO | Source: Ambulatory Visit | Admitting: Orthopedic Surgery

## 2011-01-14 ENCOUNTER — Encounter (HOSPITAL_BASED_OUTPATIENT_CLINIC_OR_DEPARTMENT_OTHER)
Admission: RE | Disposition: A | Discharge status: Home / Self Care | Payer: Self-pay | Source: Ambulatory Visit | Attending: Orthopedic Surgery

## 2011-01-14 ENCOUNTER — Other Ambulatory Visit (HOSPITAL_BASED_OUTPATIENT_CLINIC_OR_DEPARTMENT_OTHER): Payer: BC Managed Care – PPO

## 2011-01-14 DIAGNOSIS — I10 Essential (primary) hypertension: Secondary | ICD-10-CM | POA: Insufficient documentation

## 2011-01-14 DIAGNOSIS — M23329 Other meniscus derangements, posterior horn of medial meniscus, unspecified knee: Secondary | ICD-10-CM | POA: Insufficient documentation

## 2011-01-14 DIAGNOSIS — K449 Diaphragmatic hernia without obstruction or gangrene: Secondary | ICD-10-CM | POA: Insufficient documentation

## 2011-01-14 DIAGNOSIS — G4730 Sleep apnea, unspecified: Secondary | ICD-10-CM | POA: Insufficient documentation

## 2011-01-14 DIAGNOSIS — M2240 Chondromalacia patellae, unspecified knee: Secondary | ICD-10-CM | POA: Insufficient documentation

## 2011-01-14 DIAGNOSIS — Z01812 Encounter for preprocedural laboratory examination: Secondary | ICD-10-CM | POA: Insufficient documentation

## 2011-01-14 DIAGNOSIS — K219 Gastro-esophageal reflux disease without esophagitis: Secondary | ICD-10-CM | POA: Insufficient documentation

## 2011-01-14 DIAGNOSIS — G709 Myoneural disorder, unspecified: Secondary | ICD-10-CM | POA: Insufficient documentation

## 2011-01-14 HISTORY — DX: Unspecified osteoarthritis, unspecified site: M19.90

## 2011-01-14 HISTORY — DX: Gastro-esophageal reflux disease without esophagitis: K21.9

## 2011-01-14 HISTORY — DX: Diaphragmatic hernia without obstruction or gangrene: K44.9

## 2011-01-14 HISTORY — DX: Sleep apnea, unspecified: G47.30

## 2011-01-14 HISTORY — DX: Essential (primary) hypertension: I10

## 2011-01-14 SURGERY — ARTHROSCOPY, KNEE
Anesthesia: Choice | Laterality: Left

## 2011-01-14 SURGERY — ARTHROSCOPY, KNEE, WITH MEDIAL MENISCECTOMY
Anesthesia: General | Site: Knee | Laterality: Left | Wound class: Clean

## 2011-01-14 MED ORDER — DEXAMETHASONE SODIUM PHOSPHATE 4 MG/ML IJ SOLN
INTRAMUSCULAR | Status: DC | PRN
  Administered 2011-01-14: 10 mg via INTRAVENOUS

## 2011-01-14 MED ORDER — DROPERIDOL 2.5 MG/ML IJ SOLN
INTRAMUSCULAR | Status: DC | PRN
  Administered 2011-01-14: 0.625 mg via INTRAVENOUS

## 2011-01-14 MED ORDER — CEFAZOLIN SODIUM-DEXTROSE 2-3 GM-% IV SOLR
2.0000 g | INTRAVENOUS | Status: AC
  Administered 2011-01-14: 2 g via INTRAVENOUS

## 2011-01-14 MED ORDER — LACTATED RINGERS IV SOLN
INTRAVENOUS | Status: DC
  Administered 2011-01-14 (×3): via INTRAVENOUS

## 2011-01-14 MED ORDER — METHOCARBAMOL 500 MG PO TABS: 500.0000 mg | ORAL_TABLET | Freq: Four times a day (QID) | ORAL | Status: AC

## 2011-01-14 MED ORDER — LIDOCAINE HCL (CARDIAC) 20 MG/ML IV SOLN
INTRAVENOUS | Status: DC | PRN
  Administered 2011-01-14: 75 mg via INTRAVENOUS

## 2011-01-14 MED ORDER — PROMETHAZINE HCL 25 MG/ML IJ SOLN: 6.2500 mg | INTRAMUSCULAR | Status: DC | PRN

## 2011-01-14 MED ORDER — MIDAZOLAM HCL 5 MG/5ML IJ SOLN
INTRAMUSCULAR | Status: DC | PRN
  Administered 2011-01-14: 2 mg via INTRAVENOUS

## 2011-01-14 MED ORDER — KETOROLAC TROMETHAMINE 30 MG/ML IJ SOLN
INTRAMUSCULAR | Status: DC | PRN
  Administered 2011-01-14: 30 mg via INTRAVENOUS

## 2011-01-14 MED ORDER — HYDROMORPHONE HCL PF 1 MG/ML IJ SOLN
0.2500 mg | INTRAMUSCULAR | Status: DC | PRN
  Administered 2011-01-14: 0.5 mg via INTRAVENOUS

## 2011-01-14 MED ORDER — FENTANYL CITRATE 0.05 MG/ML IJ SOLN
INTRAMUSCULAR | Status: DC | PRN
  Administered 2011-01-14 (×2): 50 ug via INTRAVENOUS
  Administered 2011-01-14: 100 ug via INTRAVENOUS

## 2011-01-14 MED ORDER — HYDROCODONE-ACETAMINOPHEN 5-325 MG PO TABS: 1.0000 | ORAL_TABLET | ORAL | Status: AC | PRN

## 2011-01-14 MED ORDER — BUPIVACAINE-EPINEPHRINE 0.25% -1:200000 IJ SOLN
INTRAMUSCULAR | Status: DC | PRN
  Administered 2011-01-14: 30 mL

## 2011-01-14 MED ORDER — PROPOFOL 10 MG/ML IV EMUL
INTRAVENOUS | Status: DC | PRN
  Administered 2011-01-14: 250 mg via INTRAVENOUS

## 2011-01-14 MED ORDER — MEPERIDINE HCL 25 MG/ML IJ SOLN: 6.2500 mg | INTRAMUSCULAR | Status: DC | PRN

## 2011-01-14 MED ORDER — SODIUM CHLORIDE 0.9 % IR SOLN
Status: DC | PRN
  Administered 2011-01-14 (×2): 3000 mL

## 2011-01-14 SURGICAL SUPPLY — 45 items
APL SKNCLS STERI-STRIP NONHPOA (GAUZE/BANDAGES/DRESSINGS) ×1
BANDAGE ELASTIC 6 VELCRO ST LF (GAUZE/BANDAGES/DRESSINGS) ×3 IMPLANT
BANDAGE GAUZE ELAST BULKY 4 IN (GAUZE/BANDAGES/DRESSINGS) ×2 IMPLANT
BENZOIN TINCTURE PRP APPL 2/3 (GAUZE/BANDAGES/DRESSINGS) ×2 IMPLANT
BLADE 4.2CUDA (BLADE) IMPLANT
BLADE CUTTER GATOR 3.5 (BLADE) IMPLANT
BLADE GREAT WHITE 4.2 (BLADE) IMPLANT
BUR OVAL 4.0 (BURR) IMPLANT
CANISTER OMNI JUG 16 LITER (MISCELLANEOUS) ×1 IMPLANT
CANISTER SUCTION 2500CC (MISCELLANEOUS) IMPLANT
DRAPE ARTHROSCOPY W/POUCH 114 (DRAPES) ×2 IMPLANT
DURAPREP 26ML APPLICATOR (WOUND CARE) ×2 IMPLANT
ELECT MENISCUS 165MM 90D (ELECTRODE) IMPLANT
ELECT REM PT RETURN 9FT ADLT (ELECTROSURGICAL) ×2
ELECTRODE REM PT RTRN 9FT ADLT (ELECTROSURGICAL) ×1 IMPLANT
GAUZE KERLIX 2  STERILE LF (GAUZE/BANDAGES/DRESSINGS) ×2 IMPLANT
GLOVE BIO SURGEON STRL SZ 6.5 (GLOVE) ×1 IMPLANT
GLOVE BIOGEL M STRL SZ7.5 (GLOVE) ×1 IMPLANT
GLOVE INDICATOR 7.0 STRL GRN (GLOVE) ×1 IMPLANT
GLOVE INDICATOR 8.0 STRL GRN (GLOVE) ×1 IMPLANT
GLOVE INDICATOR 8.5 STRL (GLOVE) ×1 IMPLANT
GLOVE SURG ORTHO 8.5 STRL (GLOVE) ×1 IMPLANT
GLOVE SURG SIGNA 7.5 PF LTX (GLOVE) ×2 IMPLANT
GOWN PREVENTION PLUS XLARGE (GOWN DISPOSABLE) ×3 IMPLANT
GOWN STRL REIN 2XL LVL4 (GOWN DISPOSABLE) ×1 IMPLANT
HOLDER KNEE FOAM BLUE (MISCELLANEOUS) ×3 IMPLANT
KNEE WRAP E Z 3 GEL PACK (MISCELLANEOUS) ×1 IMPLANT
PACK ARTHROSCOPY DSU (CUSTOM PROCEDURE TRAY) ×2 IMPLANT
PACK BASIN DAY SURGERY FS (CUSTOM PROCEDURE TRAY) ×2 IMPLANT
PADDING CAST ABS 4INX4YD NS (CAST SUPPLIES)
PADDING CAST ABS COTTON 4X4 ST (CAST SUPPLIES) IMPLANT
PENCIL BUTTON HOLSTER BLD 10FT (ELECTRODE) IMPLANT
RESECTOR FULL RADIUS 4.2MM (BLADE) IMPLANT
SET ARTHROSCOPY TUBING (MISCELLANEOUS) ×2
SET ARTHROSCOPY TUBING LN (MISCELLANEOUS) ×1 IMPLANT
SPONGE GAUZE 4X4 12PLY (GAUZE/BANDAGES/DRESSINGS) ×1 IMPLANT
SPONGE GAUZE 4X4 FOR O.R. (GAUZE/BANDAGES/DRESSINGS) ×1 IMPLANT
STRIP CLOSURE SKIN 1/2X4 (GAUZE/BANDAGES/DRESSINGS) ×2 IMPLANT
SUT MNCRL AB 4-0 PS2 18 (SUTURE) ×3 IMPLANT
TOWEL OR 17X24 6PK STRL BLUE (TOWEL DISPOSABLE) ×2 IMPLANT
UNDERPAD 30X30 INCONTINENT (UNDERPADS AND DIAPERS) IMPLANT
WAND 3.0 CAPSURE 30 DEG W/CORD (SURGICAL WAND) IMPLANT
WAND 4.5 ELIMINATOR W/CORD (SURGICAL WAND) IMPLANT
WAND STAR VAC 90 (SURGICAL WAND) ×1 IMPLANT
WATER STERILE IRR 1000ML POUR (IV SOLUTION) ×2 IMPLANT

## 2011-01-14 NOTE — Anesthesia Procedure Notes (Addendum)
Performed by: Zenia Resides D   Procedure Name: LMA Insertion Date/Time: 01/14/2011 2:45 PM Performed by: Zenia Resides D Pre-anesthesia Checklist: Patient identified, Emergency Drugs available, Suction available, Patient being monitored and Timeout performed Patient Re-evaluated:Patient Re-evaluated prior to inductionOxygen Delivery Method: Circle System Utilized Preoxygenation: Pre-oxygenation with 100% oxygen Intubation Type: IV induction Ventilation: Mask ventilation without difficulty and Oral airway inserted - appropriate to patient size LMA: LMA with gastric port inserted LMA Size: 4.0 Placement Confirmation: positive ETCO2 and breath sounds checked- equal and bilateral

## 2011-01-14 NOTE — Brief Op Note (Signed)
01/14/2011  3:43 PM  PATIENT:  [name]  45 y.o. female  PRE-OPERATIVE DIAGNOSIS:  left medial meniscus tear  POST-OPERATIVE DIAGNOSIS:  * No post-op diagnosis entered *  PROCEDURE:  Procedure(s): KNEE ARTHROSCOPY WITH MEDIAL MENISECTOMY  SURGEON:  Surgeon(s): Verlee Rossetti  PHYSICIAN ASSISTANT:   ASSISTANTS: none   ANESTHESIA:   general  EBL:  Total I/O In: 1550 [I.V.:1550] Out: -   BLOOD ADMINISTERED:none  DRAINS: none   LOCAL MEDICATIONS USED:  MARCAINE 30CC  SPECIMEN:  No Specimen  DISPOSITION OF SPECIMEN:  N/A  COUNTS:  YES  TOURNIQUET:  * No tourniquets in log *  DICTATION: .Note written in EPIC  PLAN OF CARE: Discharge to home after PACU  PATIENT DISPOSITION:  PACU - hemodynamically stable.   Delay start of Pharmacological VTE agent (>24hrs) due to surgical blood loss or risk of bleeding:  not applicable

## 2011-01-14 NOTE — Anesthesia Preprocedure Evaluation (Signed)
Anesthesia Evaluation  Patient identified by MRN, date of birth, ID band Patient awake    Reviewed: Allergy & Precautions, H&P , NPO status , Patient's Chart, lab work & pertinent test results, reviewed documented beta blocker date and time   History of Anesthesia Complications (+) AWARENESS UNDER ANESTHESIA  Airway Mallampati: I TM Distance: >3 FB Neck ROM: Full    Dental  (+) Teeth Intact   Pulmonary sleep apnea ,  clear to auscultation        Cardiovascular hypertension, Pt. on medications + angina Regular Normal    Neuro/Psych  Neuromuscular disease    GI/Hepatic hiatal hernia, GERD-  Medicated,  Endo/Other  Morbid obesity  Renal/GU      Musculoskeletal   Abdominal (+) obese,   Peds  Hematology   Anesthesia Other Findings   Reproductive/Obstetrics                           Anesthesia Physical Anesthesia Plan  ASA: III  Anesthesia Plan: General   Post-op Pain Management:    Induction: Intravenous  Airway Management Planned: LMA  Additional Equipment:   Intra-op Plan:   Post-operative Plan: Extubation in OR  Informed Consent: I have reviewed the patients History and Physical, chart, labs and discussed the procedure including the risks, benefits and alternatives for the proposed anesthesia with the patient or authorized representative who has indicated his/her understanding and acceptance.     Plan Discussed with:   Anesthesia Plan Comments:         Anesthesia Quick Evaluation

## 2011-01-14 NOTE — Anesthesia Postprocedure Evaluation (Signed)
  Anesthesia Post-op Note  Patient: [name]  Procedure(s) Performed:  KNEE ARTHROSCOPY WITH MEDIAL MENISECTOMY  Patient Location: PACU  Anesthesia Type: General  Level of Consciousness: awake  Airway and Oxygen Therapy: Patient Spontanous Breathing  Post-op Pain: mild  Post-op Assessment: Post-op Vital signs reviewed, Patient's Cardiovascular Status Stable, Respiratory Function Stable, Patent Airway, No signs of Nausea or vomiting, Adequate PO intake and Pain level controlled  Post-op Vital Signs: stable  Complications: No apparent anesthesia complications

## 2011-01-14 NOTE — Transfer of Care (Signed)
Immediate Anesthesia Transfer of Care Note  Patient: [name]  Procedure(s) Performed:  KNEE ARTHROSCOPY WITH MEDIAL MENISECTOMY  Patient Location: PACU  Anesthesia Type: General  Level of Consciousness: awake, alert  and oriented  Airway & Oxygen Therapy: Patient Spontanous Breathing and Patient connected to face mask oxygen  Post-op Assessment: Report given to PACU RN and Post -op Vital signs reviewed and stable  Post vital signs: Reviewed and stable  Complications: No apparent anesthesia complications

## 2011-01-14 NOTE — H&P (Signed)
Subjective: Patient is here  for left total knee arthroscopy.  Patient is a 45 y.o. female presented with a history of pain in the left knee. Onset of symptoms was months ago with worsening course since that time. The patient noted no past surgery on the left knee. Patient has been treated conservatively with over-the-counter NSAIDs and activity modification. Patient currently rates pain in the knee at 8 out of 10 with activity. There is pain with activity including squat bend twist.  There are no active problems to display for this patient.  Past Medical History  Diagnosis Date  . Hypertension   . Angina   . Sleep apnea   . GERD (gastroesophageal reflux disease)   . Arthritis   . Hiatal hernia     Past Surgical History  Procedure Date  . Abdominal hysterectomy     Prescriptions prior to admission  Medication Sig Dispense Refill  . metoprolol (TOPROL-XL) 50 MG 24 hr tablet Take 50 mg by mouth daily.       Marland Kitchen omeprazole (PRILOSEC) 40 MG capsule Take 40 mg by mouth as needed.         Allergies  Allergen Reactions  . Oxycodone Shortness Of Breath    History  Substance Use Topics  . Smoking status: Not on file  . Smokeless tobacco: Not on file  . Alcohol Use:     History reviewed. No pertinent family history.  Review of Systems   Objective: Vital signs in last 24 hours: Temp:  [98.3 F (36.8 C)] 98.3 F (36.8 C) (11/07 1112) Pulse Rate:  [71] 71  (11/07 1112) Resp:  [18] 18  (11/07 1112) BP: (133)/(78) 133/78 mmHg (11/07 1112) SpO2:  [96 %] 96 % (11/07 1112)  Left knee painful ROM . MJLT. 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 The overall alignment is neutral. The bone quality appears to be excellent for age and reported activity level.  Assessment/Plan: Medial meniscus tear, left knee   The patient history, physical examination and imaging studies are consistent with medial  meniscal tear left knee. The treatment options including medical management, injections, modification of activity discussed at length. The risks and benefits of left knee arthroscopy were presented and reviewed. The risks of surgery were discussed. The patient acknowledged the explanation, agreed to proceed with the plan and a consent was signed.

## 2011-01-15 NOTE — Op Note (Signed)
NAME:  [name], [name]                ACCOUNT NO.:  MEDICAL RECORD NO.:  [id number]  LOCATION:                                 FACILITY:  PHYSICIAN:  Almedia Balls. Ranell Patrick, M.D. DATE OF BIRTH:  08-Nov-1965  DATE OF PROCEDURE:  01/14/2011 DATE OF DISCHARGE:                              OPERATIVE REPORT   PREOPERATIVE DIAGNOSIS:  Left knee medial meniscus tear.  POSTOPERATIVE DIAGNOSES: 1. Left knee medial meniscus tear. 2. 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 3. Lateral femoral condyle chondromalacia, focal, grade 3. 4. Patellofemoral chondromalacia grade 3 and 4.  PROCEDURE PERFORMED:  Left knee arthroscopy with partial medial meniscectomy as well as chondroplasty not to bleeding bone, medial, lateral, and patellofemoral compartments.  ATTENDING SURGEON:  Almedia Balls. Ranell Patrick, MD  ASSISTANT:  None.  ANESTHESIA:  General anesthesia via laryngeal mask was used.  ESTIMATED BLOOD LOSS:  Minimal.  FLUID REPLACEMENT:  1200 mL crystalloids.  INSTRUMENT COUNTS:  Correct.  COMPLICATIONS:  No complications.  Perioperative antibiotics were given.  INDICATIONS:  The patient is a 45 year old female with a history of worsening left medial knee pain secondary to torn medial meniscus.  She has failed all measures of conservative management including modification of activity, injections, anti-inflammatories presents for operative treatment to restore function and eliminate pain in the knee. Informed consent was obtained.  DESCRIPTION OF PROCEDURE:  After an adequate level of anesthesia was achieved, the patient was positioned supine on the operating table.  The left leg was correctly identified.  A time-out was called.  The left leg was placed in arthroscopic leg holder.  Right leg placed in a well leg holder.  Sterile prep and drape of the left knee.  We entered the knee using standard portals including superolateral outflow, anterolateral scope, and anteromedial  working portals.  We identified severe synovitis and inflammation inside the knee joint.  There was a clear severe trochlear wear deep in the trochlear notch.  Patella had some grade 3 and grade 4 changes.  Tracking seems to be fine.  There were no loose bodies but lot of synovitis in the suprapatellar pouch, medial, and lateral gutters.  We identified in the medial compartment, a torn medial meniscus.  This was a complex posterior horn tear that represented about 30% of the posterior horn of meniscus.  We performed a partial meniscectomy leaving a stable peripheral portion of the meniscus intact. Pretty good amount of meniscus remained.  Hoop stresses with the anterior and posterior meniscal roots intact were preserved.  The femoral condyle had a large area of grade 3 chondromalacia with some loose flaps and fibrillation at the margins of the defect which we had used a motorized shaver with a tangential chondroplasty technique to smooth that down these transition areas.  No where did I see full thickness wear.  The tibial cartilage had grade 2 changes diffusely. Following completion of debridement in the medial compartment, we inspected the ACL and PCL which were intact and went to the lateral compartment.  There was a small free edge lateral meniscus tear which really was frayed area with a small parrot-beak tear.  We just did a very gentle meniscectomy which  represented less than 5% of the meniscus just at the posterior horn.  The remainder of the meniscus was probed and felt to be stable.  The articular cartilage was basically normal in the lateral compartment except the focal area about a 0.5 cm x 0.5 cm near full thickness, basically looked like a chondral fracture.  There was 1 loose flap which I removed with tangential chondroplasty but the remainder of it looked stable and we left it.  The majority of the cartilage and the compartment looked normal.  The patellofemoral trochlear  area was awful.  As far as the cartilage wear, we did a chondroplasty in that area just smoothing down the rough areas.  There was quite a bit of rough loose flaps and fibrillation in trochlear region.  Following a final inspection of the knee and lavage, we concluded the surgery suturing the wounds with 4-0 Monocryl followed by Steri-Strips and sterile compressive bandage.  The patient tolerated the procedure well.     Almedia Balls. Ranell Patrick, M.D.     SRN/MEDQ  D:  01/14/2011  T:  01/14/2011  Job:  [id number]

## 2011-01-15 NOTE — Brief Op Note (Signed)
01/14/2011  2:39 PM  PATIENT:  [name]  45 y.o. female  PRE-OPERATIVE DIAGNOSIS:  left medial meniscus tear  POST-OPERATIVE DIAGNOSIS:  Left medial meniscal tear  PROCEDURE:  Procedure(s): KNEE ARTHROSCOPY WITH MEDIAL MENISECTOMY  SURGEON:  Surgeon(s): Verlee Rossetti  PHYSICIAN ASSISTANT:   ASSISTANTS: none   ANESTHESIA:   general  EBL:  Total I/O In: 1822 [P.O.:222; I.V.:1600] Out: -   BLOOD ADMINISTERED:none  DRAINS: none   LOCAL MEDICATIONS USED:  MARCAINE 30 CC  SPECIMEN:  No Specimen  DISPOSITION OF SPECIMEN:  N/A  COUNTS:  YES  TOURNIQUET:  * No tourniquets in log *  DICTATION: .Note written in EPIC  PLAN OF CARE: Discharge to home after PACU  PATIENT DISPOSITION:  PACU - hemodynamically stable.   Delay start of Pharmacological VTE agent (>24hrs) due to surgical blood loss or risk of bleeding:  {YES/NO/NOT APPLICABLE:20182

## 2011-01-27 NOTE — Addendum Note (Signed)
Addendum  created 01/27/11 1015 by E. Sayana Salley, MD   Modules edited:Anesthesia Responsible Staff    

## 2011-01-27 NOTE — Addendum Note (Signed)
Addendum  created 01/27/11 1015 by Germaine Pomfret, MD   Modules edited:Anesthesia Responsible Staff

## 2011-03-20 ENCOUNTER — Emergency Department (HOSPITAL_COMMUNITY)
Admission: EM | Admit: 2011-03-20 | Discharge: 2011-03-20 | Disposition: A | Discharge status: Home / Self Care | Payer: BC Managed Care – PPO | Attending: Emergency Medicine | Admitting: Emergency Medicine

## 2011-03-20 ENCOUNTER — Telehealth: Payer: Self-pay | Admitting: Cardiovascular Disease

## 2011-03-20 ENCOUNTER — Emergency Department (HOSPITAL_COMMUNITY): Payer: BC Managed Care – PPO

## 2011-03-20 ENCOUNTER — Encounter (HOSPITAL_COMMUNITY): Payer: Self-pay | Admitting: Emergency Medicine

## 2011-03-20 ENCOUNTER — Other Ambulatory Visit: Payer: Self-pay

## 2011-03-20 DIAGNOSIS — M129 Arthropathy, unspecified: Secondary | ICD-10-CM | POA: Insufficient documentation

## 2011-03-20 DIAGNOSIS — I10 Essential (primary) hypertension: Secondary | ICD-10-CM | POA: Insufficient documentation

## 2011-03-20 DIAGNOSIS — R42 Dizziness and giddiness: Secondary | ICD-10-CM | POA: Insufficient documentation

## 2011-03-20 DIAGNOSIS — R55 Syncope and collapse: Secondary | ICD-10-CM | POA: Insufficient documentation

## 2011-03-20 DIAGNOSIS — Z79899 Other long term (current) drug therapy: Secondary | ICD-10-CM | POA: Insufficient documentation

## 2011-03-20 DIAGNOSIS — N390 Urinary tract infection, site not specified: Secondary | ICD-10-CM | POA: Insufficient documentation

## 2011-03-20 DIAGNOSIS — K219 Gastro-esophageal reflux disease without esophagitis: Secondary | ICD-10-CM | POA: Insufficient documentation

## 2011-03-20 DIAGNOSIS — H538 Other visual disturbances: Secondary | ICD-10-CM | POA: Insufficient documentation

## 2011-03-20 DIAGNOSIS — R002 Palpitations: Secondary | ICD-10-CM | POA: Insufficient documentation

## 2011-03-20 LAB — CARDIAC PANEL(CRET KIN+CKTOT+MB+TROPI)
CK, MB: 1.4 ng/mL (ref 0.3–4.0)
Total CK: 112 U/L (ref 7–177)

## 2011-03-20 LAB — URINALYSIS, ROUTINE W REFLEX MICROSCOPIC
Glucose, UA: NEGATIVE mg/dL
Ketones, ur: NEGATIVE mg/dL
Nitrite: NEGATIVE
Protein, ur: NEGATIVE mg/dL

## 2011-03-20 LAB — CBC
MCH: 27.9 pg (ref 26.0–34.0)
MCHC: 33 g/dL (ref 30.0–36.0)
Platelets: 284 10*3/uL (ref 150–400)
RDW: 14.2 % (ref 11.5–15.5)

## 2011-03-20 LAB — URINE MICROSCOPIC-ADD ON

## 2011-03-20 LAB — BASIC METABOLIC PANEL
BUN: 11 mg/dL (ref 6–23)
Calcium: 9.4 mg/dL (ref 8.4–10.5)
GFR calc non Af Amer: >90 mL/min (ref >90)
Glucose, Bld: 87 mg/dL (ref 70–99)

## 2011-03-20 MED ORDER — SULFAMETHOXAZOLE-TRIMETHOPRIM 800-160 MG PO TABS: 1.0000 | ORAL_TABLET | Freq: Two times a day (BID) | ORAL | Status: AC

## 2011-03-20 NOTE — ED Notes (Signed)
Patient ambulating at [employer] today and states felt heart racing with the feeling of passing out.  Patient states rested then walked again felt better then symptoms returned.  Patient stated Picked up family member and was sitting down felt same symptoms Denies chest pain or shortness of breath. Airway intact bilateral equal chest rise and fall. Ax4 Resting comfortably on stretcher.

## 2011-03-20 NOTE — ED Notes (Signed)
Patient ambulated to bathroom and returned steady gait denies any complaints.

## 2011-03-20 NOTE — ED Notes (Signed)
Discharge inst given voiced understanding. 

## 2011-03-20 NOTE — ED Notes (Signed)
Radial pulse 64 bpm while waiting briefly in ED lobby.

## 2011-03-20 NOTE — ED Notes (Signed)
Patient states heart racing while at a store and feeling of passing out NO LOC

## 2011-03-20 NOTE — Telephone Encounter (Signed)
Pt at er currently.

## 2011-03-20 NOTE — ED Provider Notes (Signed)
History     CSN: [id number]  Arrival date & time 03/20/11  1544   First MD Initiated Contact with Patient 03/20/11 1619      Chief Complaint  Patient presents with  . Palpitations    (Consider location/radiation/quality/duration/timing/severity/associated sxs/prior treatment) Patient is a 46 y.o. female presenting with palpitations. The history is provided by the patient.  Palpitations  This is a new problem. The current episode started 3 to 5 hours ago. The problem has been resolved. The problem is associated with an unknown factor. Associated symptoms include near-syncope and dizziness. Pertinent negatives include no diaphoresis, no fever, no numbness, no chest pain, no chest pressure, no syncope, no abdominal pain, no nausea, no vomiting, no headaches, no back pain, no weakness, no cough and no shortness of breath.    Pt with episodic fluttering feeling in chest this afternoon now completely resolved. Associated with lightheadedness and blurry vision. No focal symptoms. No CP, SOB, abd pain, urinary symptoms.   Past Medical History  Diagnosis Date  . Hypertension   . Angina   . Sleep apnea   . GERD (gastroesophageal reflux disease)   . Arthritis   . Hiatal hernia     Past Surgical History  Procedure Date  . Abdominal hysterectomy     No family history on file.  History  Substance Use Topics  . Smoking status: Never Smoker   . Smokeless tobacco: Not on file  . Alcohol Use: No    OB History    Grav Para Term Preterm Abortions TAB SAB Ect Mult Living                  Review of Systems  Constitutional: Negative for fever and diaphoresis.  Respiratory: Negative for cough and shortness of breath.   Cardiovascular: Positive for palpitations and near-syncope. Negative for chest pain and syncope.  Gastrointestinal: Negative for nausea, vomiting and abdominal pain.  Musculoskeletal: Negative for back pain.  Neurological: Positive for dizziness. Negative for weakness,  numbness and headaches.    Allergies  Oxycodone  Home Medications   Current Outpatient Rx  Name Route Sig Dispense Refill  . METOPROLOL SUCCINATE ER 50 MG PO TB24 Oral Take 50 mg by mouth daily.     Marland Kitchen OMEPRAZOLE 40 MG PO CPDR Oral Take 40 mg by mouth as needed. For heartburn.    . SULFAMETHOXAZOLE-TRIMETHOPRIM 800-160 MG PO TABS Oral Take 1 tablet by mouth every 12 (twelve) hours. 14 tablet 0    BP 122/70  Pulse 66  Temp(Src) 98.7 F (37.1 C) (Oral)  Resp 18  SpO2 100%  Physical Exam  Nursing note and vitals reviewed. Constitutional: She is oriented to person, place, and time. She appears well-developed and well-nourished. No distress.  HENT:  Head: Normocephalic and atraumatic.  Mouth/Throat: Oropharynx is clear and moist.  Eyes: EOM are normal. Pupils are equal, round, and reactive to light.  Neck: Normal range of motion. Neck supple.  Cardiovascular: Normal rate and regular rhythm.   Pulmonary/Chest: Effort normal and breath sounds normal. No respiratory distress. She has no wheezes. She has no rales.  Abdominal: Soft. Bowel sounds are normal. She exhibits no distension and no mass. There is no tenderness. There is no rebound and no guarding.  Musculoskeletal: Normal range of motion. She exhibits no edema and no tenderness.  Neurological: She is alert and oriented to person, place, and time. No cranial nerve deficit.  Skin: Skin is warm and dry. No rash noted. No erythema.  Psychiatric:  She has a normal mood and affect. Her behavior is normal.    ED Course  Procedures (including critical care time)  Labs Reviewed  CBC - Abnormal; Notable for the following:    WBC 10.9 (*)    All other components within normal limits  URINALYSIS, ROUTINE W REFLEX MICROSCOPIC - Abnormal; Notable for the following:    APPearance HAZY (*)    Leukocytes, UA MODERATE (*)    All other components within normal limits  URINE MICROSCOPIC-ADD ON - Abnormal; Notable for the following:     Squamous Epithelial / LPF MANY (*)    Bacteria, UA MANY (*)    All other components within normal limits  CARDIAC PANEL(CRET KIN+CKTOT+MB+TROPI)  BASIC METABOLIC PANEL   Dg Chest 2 View  03/20/2011  *RADIOLOGY REPORT*  Clinical Data: Heart fluttering.  Dizziness.  Hypertension.  CHEST - 2 VIEW  Comparison: 04/03/2010  Findings: Low lung volumes.  Chronic subsegmental atelectasis or linear scarring at the left base. Cardiopericardial silhouette is at upper limits of normal for size.  No edema or focal airspace consolidation. Imaged bony structures of the thorax are intact.  IMPRESSION: Stable.  No new or acute cardiopulmonary findings.  Original Report Authenticated By: ERIC A. MANSELL, M.D.     1. Palpitations   2. UTI (lower urinary tract infection)       MDM   Pt is asymptomatic during her entire stay in ED. ? UTI though contaminated. Will treat and have f/u with PMD or return immediately for worsening symptoms or concerns        Loren Racer, MD 03/20/11 1940

## 2011-03-20 NOTE — ED Notes (Signed)
EKG completed 1551 in triage

## 2011-03-20 NOTE — Telephone Encounter (Signed)
New Problem   Patient experiencing palpitations, please return on mobile [phone].  Will go to ER if problems persist

## 2011-03-30 ENCOUNTER — Encounter: Payer: Self-pay | Admitting: Nurse Practitioner

## 2011-03-30 ENCOUNTER — Ambulatory Visit: Payer: BC Managed Care – PPO | Admitting: Nurse Practitioner

## 2011-04-01 ENCOUNTER — Ambulatory Visit: Payer: BC Managed Care – PPO | Admitting: Nurse Practitioner

## 2011-05-11 ENCOUNTER — Telehealth: Payer: Self-pay | Admitting: Cardiovascular Disease

## 2011-05-11 NOTE — Telephone Encounter (Signed)
Please return call to patient at hm# [phone]  Patient has been experiencing palpitations, SOB & dizziness for the last several days.  She would like to be seen as soon as possible, next available end of march.

## 2011-05-11 NOTE — Telephone Encounter (Signed)
Pt called msg left/ needs app with Norma Fredrickson np as soon as availability will allow. Noted she missed last two app.

## 2011-05-12 ENCOUNTER — Ambulatory Visit (INDEPENDENT_AMBULATORY_CARE_PROVIDER_SITE_OTHER): Payer: BC Managed Care – PPO | Admitting: Nurse Practitioner

## 2011-05-12 ENCOUNTER — Encounter: Payer: Self-pay | Admitting: Nurse Practitioner

## 2011-05-12 VITALS — BP 110/88 | HR 76 | Ht 65.0 in | Wt 308.0 lb

## 2011-05-12 DIAGNOSIS — R002 Palpitations: Secondary | ICD-10-CM | POA: Insufficient documentation

## 2011-05-12 HISTORY — DX: Palpitations: R00.2

## 2011-05-12 LAB — BASIC METABOLIC PANEL
BUN: 11 mg/dL (ref 6–23)
CO2: 27 mEq/L (ref 19–32)
Calcium: 9 mg/dL (ref 8.4–10.5)
Chloride: 101 mEq/L (ref 96–112)
Creatinine, Ser: 0.7 mg/dL (ref 0.4–1.2)
GFR: 125.74 mL/min (ref >60.00)
Glucose, Bld: 82 mg/dL (ref 70–99)
Potassium: 3.8 mEq/L (ref 3.5–5.1)
Sodium: 142 mEq/L (ref 135–145)

## 2011-05-12 LAB — CBC WITH DIFFERENTIAL/PLATELET
Basophils Absolute: 0.1 K/uL (ref 0.0–0.1)
Basophils Relative: 0.7 % (ref 0.0–3.0)
Eosinophils Absolute: 0.3 K/uL (ref 0.0–0.7)
Eosinophils Relative: 2.4 % (ref 0.0–5.0)
HCT: 39.2 % (ref 36.0–46.0)
Hemoglobin: 13 g/dL (ref 12.0–15.0)
Lymphocytes Relative: 38.7 % (ref 12.0–46.0)
Lymphs Abs: 4.5 K/uL — ABNORMAL HIGH (ref 0.7–4.0)
MCHC: 33.1 g/dL (ref 30.0–36.0)
MCV: 84.5 fl (ref 78.0–100.0)
Monocytes Absolute: 0.9 K/uL (ref 0.1–1.0)
Monocytes Relative: 8.2 % (ref 3.0–12.0)
Neutro Abs: 5.8 K/uL (ref 1.4–7.7)
Neutrophils Relative %: 50 % (ref 43.0–77.0)
Platelets: 292 K/uL (ref 150.0–400.0)
RBC: 4.64 Mil/uL (ref 3.87–5.11)
RDW: 14.9 % — ABNORMAL HIGH (ref 11.5–14.6)
WBC: 11.6 K/uL — ABNORMAL HIGH (ref 4.5–10.5)

## 2011-05-12 LAB — TSH: TSH: 1.13 u[IU]/mL (ref 0.35–5.50)

## 2011-05-12 NOTE — Assessment & Plan Note (Signed)
Patient presents with increasing episodes of palpitations with heart racing and presyncopal feeling. Does have short PR on EKG. Will place an event monitor for the next 30 days. We will check some baseline labs today as well. I have left her on her current medicines for now. Further disposition to follow once her monitoring period is complete. I suspect that most of her issue is weight related. Patient is agreeable to this plan and will call if any problems develop in the interim.

## 2011-05-12 NOTE — Patient Instructions (Signed)
We are going to put a monitor on for the next month to watch your heart rhythm.  Stay on your current medicines for now.  We will check labs today.  Call the [facility] office at [phone] if you have any questions, problems or concerns.

## 2011-05-12 NOTE — Progress Notes (Signed)
   [name] Date of Birth: 04/07/1965 Medical Record #[id number]  History of Present Illness: [name] is seen today for a work in visit. She is seen for Dr. Elease Hashimoto. She is a 46 year old female with a history of GERD, HTN and morbid obesity. She called earlier this week complaining of palpitations, shortness of breath and dizziness for the past several days. She requested evaluation. She is a former patient of Dr. Silva Bandy. She had a negative stress echo in 2009.   She comes in today. She notes that she has had more in the way of palpitations for the past week. It usually occurs at work. She does work 3rd shift. She does not really use any caffeine. Her spells last for about an hour. She feels like she is going to pass out. No chest pain. Does have DOE, probably due to her weight. She has not tried taking any extra medicine. The spells abate with rest and deep breathing.   Current Outpatient Prescriptions on File Prior to Visit  Medication Sig Dispense Refill  . metoprolol (TOPROL-XL) 50 MG 24 hr tablet Take 50 mg by mouth daily.       Marland Kitchen omeprazole (PRILOSEC) 40 MG capsule Take 40 mg by mouth as needed. For heartburn.        Allergies  Allergen Reactions  . Oxycodone Shortness Of Breath    Past Medical History  Diagnosis Date  . Hypertension   . Sleep apnea     does not wear CPAP  . GERD (gastroesophageal reflux disease)   . Arthritis   . Hiatal hernia   . Morbid obesity     Past Surgical History  Procedure Date  . Abdominal hysterectomy   . Cardiovascular stress test 07/25/2007    Negative stress echo; ef 55-60%    History  Smoking status  . Never Smoker   Smokeless tobacco  . Not on file    History  Alcohol Use No    Family History  Problem Relation Age of Onset  . Cervical cancer Mother   . Hypertension Sister   . Diabetes Sister   . Heart disease Neg Hx     Review of Systems: The review of systems is positive for palpitations.  All other systems  were reviewed and are negative.  Physical Exam: BP 110/88  Pulse 76  Ht 5\' 5"  (1.651 m)  Wt 308 lb (139.708 kg)  BMI 51.25 kg/m2 Patient is very pleasant and in no acute distress. She is morbidly obese. Skin is warm and dry. Color is normal.  HEENT is unremarkable. Normocephalic/atraumatic. PERRL. Sclera are nonicteric. Neck is supple. No masses. No JVD. Lungs are clear. Cardiac exam shows a regular rate and rhythm. Abdomen is obese but soft. Extremities are without edema. Gait and ROM are intact. No gross neurologic deficits noted.  LABORATORY DATA: EKG shows sinus rhythm. She does have T wave inversion in lead III. This is unchanged. PR is .12   Assessment / Plan:

## 2011-05-13 ENCOUNTER — Other Ambulatory Visit: Payer: Self-pay | Admitting: Cardiovascular Disease

## 2011-05-13 DIAGNOSIS — R002 Palpitations: Secondary | ICD-10-CM

## 2011-05-13 MED ORDER — METOPROLOL SUCCINATE ER 50 MG PO TB24: 50.0000 mg | ORAL_TABLET | Freq: Every day | ORAL | Status: DC

## 2011-05-13 NOTE — Telephone Encounter (Signed)
Called Rx . For metoprolol and advised patient

## 2011-05-13 NOTE — Telephone Encounter (Signed)
Pt former provider called this in but they have closed down and patient wants Lawson Fiscal to call this in

## 2011-06-03 ENCOUNTER — Telehealth: Payer: Self-pay | Admitting: Cardiovascular Disease

## 2011-06-03 NOTE — Telephone Encounter (Signed)
Pt to move patches around in same vicinity, pt was keeping electrode pad on same spot of skin. Told her to put abx ointment on red skin and to call back with further concern, pt agreed to plan

## 2011-06-03 NOTE — Telephone Encounter (Signed)
Please return call to patient at [phone]   Patient has E-Cardio Monitor, skin is really irritated even with hypo allergenic patches. Pleas return call to patient at [phone].

## 2011-06-23 ENCOUNTER — Encounter: Payer: Self-pay | Admitting: Cardiovascular Disease

## 2011-06-28 ENCOUNTER — Encounter (HOSPITAL_COMMUNITY): Payer: Self-pay | Admitting: Emergency Medicine

## 2011-06-28 ENCOUNTER — Emergency Department (HOSPITAL_COMMUNITY)
Admission: EM | Admit: 2011-06-28 | Discharge: 2011-06-29 | Disposition: A | Discharge status: Home / Self Care | Payer: BC Managed Care – PPO | Attending: Emergency Medicine | Admitting: Emergency Medicine

## 2011-06-28 DIAGNOSIS — R300 Dysuria: Secondary | ICD-10-CM | POA: Insufficient documentation

## 2011-06-28 DIAGNOSIS — N390 Urinary tract infection, site not specified: Secondary | ICD-10-CM

## 2011-06-28 DIAGNOSIS — K219 Gastro-esophageal reflux disease without esophagitis: Secondary | ICD-10-CM | POA: Insufficient documentation

## 2011-06-28 DIAGNOSIS — R109 Unspecified abdominal pain: Secondary | ICD-10-CM | POA: Insufficient documentation

## 2011-06-28 DIAGNOSIS — R350 Frequency of micturition: Secondary | ICD-10-CM | POA: Insufficient documentation

## 2011-06-28 DIAGNOSIS — I10 Essential (primary) hypertension: Secondary | ICD-10-CM | POA: Insufficient documentation

## 2011-06-28 DIAGNOSIS — R3915 Urgency of urination: Secondary | ICD-10-CM | POA: Insufficient documentation

## 2011-06-28 DIAGNOSIS — G4730 Sleep apnea, unspecified: Secondary | ICD-10-CM | POA: Insufficient documentation

## 2011-06-28 LAB — URINALYSIS, ROUTINE W REFLEX MICROSCOPIC
Bilirubin Urine: NEGATIVE
Glucose, UA: NEGATIVE mg/dL
Ketones, ur: NEGATIVE mg/dL
Protein, ur: 30 mg/dL — AB

## 2011-06-28 LAB — URINE MICROSCOPIC-ADD ON

## 2011-06-28 MED ORDER — NITROFURANTOIN MONOHYD MACRO 100 MG PO CAPS
100.0000 mg | ORAL_CAPSULE | Freq: Once | ORAL | Status: AC
  Administered 2011-06-29: 100 mg via ORAL
  Filled 2011-06-28: qty 1

## 2011-06-28 NOTE — ED Notes (Signed)
C/o pressure and pain when urination, frequent urination, and blood when she wipes since 8:30pm tonight.

## 2011-06-28 NOTE — ED Provider Notes (Signed)
History     CSN: [id number]  Arrival date & time 06/28/11  2202   First MD Initiated Contact with Patient 06/28/11 2332      Chief Complaint  Patient presents with  . Dysuria    (Consider location/radiation/quality/duration/timing/severity/associated sxs/prior treatment) HPI  46 year old female presents with chief complaints of dysuria. Symptoms started today. She described her symptoms of burning urination, urinary frequency and urgency with blood in the urine. She also expresses some suprapubic abdominal pain. Onset was gradual, symptom is persistent. Nothing makes it worse, nothing makes it better. Patient denies fever, back pain, vaginal discharge, or rash. States she has a hysterectomy and is currently not sexually active. She denies a significant history of UTI. She denies history of diabetes.  Past Medical History  Diagnosis Date  . Hypertension   . Sleep apnea     does not wear CPAP  . GERD (gastroesophageal reflux disease)   . Arthritis   . Hiatal hernia   . Morbid obesity     Past Surgical History  Procedure Date  . Abdominal hysterectomy   . Cardiovascular stress test 07/25/2007    Negative stress echo; ef 55-60%    Family History  Problem Relation Age of Onset  . Cervical cancer Mother   . Hypertension Sister   . Diabetes Sister   . Heart disease Neg Hx     History  Substance Use Topics  . Smoking status: Never Smoker   . Smokeless tobacco: Not on file  . Alcohol Use: No    OB History    Grav Para Term Preterm Abortions TAB SAB Ect Mult Living                  Review of Systems  All other systems reviewed and are negative.    Allergies  Oxycodone  Home Medications   Current Outpatient Rx  Name Route Sig Dispense Refill  . CALCIUM CARBONATE-VITAMIN D 600-400 MG-UNIT PO CHEW Oral Chew 1 tablet by mouth daily.    Marland Kitchen DIPHENHYDRAMINE-APAP (SLEEP) 25-500 MG PO TABS Oral Take 1 tablet by mouth at bedtime as needed. For pain    . METOPROLOL  SUCCINATE ER 50 MG PO TB24 Oral Take 1 tablet (50 mg total) by mouth daily. 30 tablet 5  . OMEPRAZOLE 40 MG PO CPDR Oral Take 40 mg by mouth as needed. For heartburn.      BP 147/69  Pulse 72  Temp(Src) 98.4 F (36.9 C) (Oral)  Resp 19  SpO2 98%  Physical Exam  Nursing note and vitals reviewed. Constitutional: She is oriented to person, place, and time. She appears well-developed and well-nourished. No distress.       Awake, alert, nontoxic appearance. Morbidly obese  HENT:  Head: Atraumatic.  Eyes: Conjunctivae are normal. Right eye exhibits no discharge. Left eye exhibits no discharge.  Neck: Neck supple.  Cardiovascular: Normal rate and regular rhythm.   Pulmonary/Chest: Effort normal. No respiratory distress. She exhibits no tenderness.  Abdominal: Soft. There is tenderness in the suprapubic area. There is no rebound and no CVA tenderness.  Musculoskeletal: She exhibits no tenderness.       ROM appears intact, no obvious focal weakness  Neurological: She is alert and oriented to person, place, and time.       Mental status and motor strength appears intact  Skin: No rash noted.  Psychiatric: She has a normal mood and affect.    ED Course  Procedures (including critical care time)  Labs Reviewed  URINALYSIS, ROUTINE W REFLEX MICROSCOPIC - Abnormal; Notable for the following:    APPearance CLOUDY (*)    Specific Gravity, Urine 1.003 (*)    Hgb urine dipstick LARGE (*)    Protein, ur 30 (*)    Leukocytes, UA MODERATE (*)    All other components within normal limits  URINE MICROSCOPIC-ADD ON  CBC  DIFFERENTIAL   No results found.   No diagnosis found.  Results for orders placed during the hospital encounter of 06/28/11  URINALYSIS, ROUTINE W REFLEX MICROSCOPIC      Component Value Range   Color, Urine YELLOW  YELLOW    APPearance CLOUDY (*) CLEAR    Specific Gravity, Urine 1.003 (*) 1.005 - 1.030    pH 6.5  5.0 - 8.0    Glucose, UA NEGATIVE  NEGATIVE (mg/dL)     Hgb urine dipstick LARGE (*) NEGATIVE    Bilirubin Urine NEGATIVE  NEGATIVE    Ketones, ur NEGATIVE  NEGATIVE (mg/dL)   Protein, ur 30 (*) NEGATIVE (mg/dL)   Urobilinogen, UA 0.2  0.0 - 1.0 (mg/dL)   Nitrite NEGATIVE  NEGATIVE    Leukocytes, UA MODERATE (*) NEGATIVE   URINE MICROSCOPIC-ADD ON      Component Value Range   Squamous Epithelial / LPF RARE  RARE    WBC, UA 7-10  <3 (WBC/hpf)   RBC / HPF 7-10  <3 (RBC/hpf)   Bacteria, UA RARE  RARE   CBC      Component Value Range   WBC 15.5 (*) 4.0 - 10.5 (K/uL)   RBC 4.46  3.87 - 5.11 (MIL/uL)   Hemoglobin 12.5  12.0 - 15.0 (g/dL)   HCT 16.1  09.6 - 04.5 (%)   MCV 85.0  78.0 - 100.0 (fL)   MCH 28.0  26.0 - 34.0 (pg)   MCHC 33.0  30.0 - 36.0 (g/dL)   RDW 40.9  81.1 - 91.4 (%)   Platelets 288  150 - 400 (K/uL)  DIFFERENTIAL      Component Value Range   Neutrophils Relative 58  43 - 77 (%)   Neutro Abs 9.0 (*) 1.7 - 7.7 (K/uL)   Lymphocytes Relative 32  12 - 46 (%)   Lymphs Abs 5.0 (*) 0.7 - 4.0 (K/uL)   Monocytes Relative 7  3 - 12 (%)   Monocytes Absolute 1.1 (*) 0.1 - 1.0 (K/uL)   Eosinophils Relative 2  0 - 5 (%)   Eosinophils Absolute 0.3  0.0 - 0.7 (K/uL)   Basophils Relative 1  0 - 1 (%)   Basophils Absolute 0.1  0.0 - 0.1 (K/uL)  POCT PREGNANCY, URINE      Component Value Range   Preg Test, Ur NEGATIVE  NEGATIVE    No results found.    MDM  Patient with dysuria and suprapubic abdominal pain. UA shows evidence of urinary tract infection. Patient has no flank pain or CVA tenderness concerning for pyelonephritis or kidney stone. She is afebrile with stable normal vital signs. She has a nonsurgical abdomen. Low suspicion for sexually transmitted diseases patient denies any recent sexual activities and denies any vaginal discharge. She was given Macrobid in the ED and a prescription to take home. Urine culture sent. Patient voiced understanding and agrees with plan. Patient is stable to be discharged.        Fayrene Helper, PA-C 06/29/11 0025

## 2011-06-29 LAB — POCT I-STAT, CHEM 8
BUN: 9 mg/dL (ref 6–23)
Creatinine, Ser: 0.7 mg/dL (ref 0.50–1.10)
Glucose, Bld: 96 mg/dL (ref 70–99)
Sodium: 143 mEq/L (ref 135–145)
TCO2: 28 mmol/L (ref 0–100)

## 2011-06-29 LAB — CBC
HCT: 37.9 % (ref 36.0–46.0)
Hemoglobin: 12.5 g/dL (ref 12.0–15.0)
RBC: 4.46 MIL/uL (ref 3.87–5.11)
WBC: 15.5 10*3/uL — ABNORMAL HIGH (ref 4.0–10.5)

## 2011-06-29 LAB — DIFFERENTIAL
Basophils Absolute: 0.1 10*3/uL (ref 0.0–0.1)
Lymphocytes Relative: 32 % (ref 12–46)
Monocytes Absolute: 1.1 10*3/uL — ABNORMAL HIGH (ref 0.1–1.0)
Monocytes Relative: 7 % (ref 3–12)
Neutro Abs: 9 10*3/uL — ABNORMAL HIGH (ref 1.7–7.7)

## 2011-06-29 MED ORDER — NITROFURANTOIN MONOHYD MACRO 100 MG PO CAPS: 100.0000 mg | ORAL_CAPSULE | Freq: Two times a day (BID) | ORAL | Status: AC

## 2011-06-29 NOTE — Discharge Instructions (Signed)
Urinary Tract Infection Infections of the urinary tract can start in several places. A bladder infection (cystitis), a kidney infection (pyelonephritis), and a prostate infection (prostatitis) are different types of urinary tract infections (UTIs). They usually get better if treated with medicines (antibiotics) that kill germs. Take all the medicine until it is gone. You or your child may feel better in a few days, but TAKE ALL MEDICINE or the infection may not respond and may become more difficult to treat. HOME CARE INSTRUCTIONS   Drink enough water and fluids to keep the urine clear or pale yellow. Cranberry juice is especially recommended, in addition to large amounts of water.   Avoid caffeine, tea, and carbonated beverages. They tend to irritate the bladder.   Alcohol may irritate the prostate.   Only take over-the-counter or prescription medicines for pain, discomfort, or fever as directed by your caregiver.  To prevent further infections:  Empty the bladder often. Avoid holding urine for long periods of time.   After a bowel movement, women should cleanse from front to back. Use each tissue only once.   Empty the bladder before and after sexual intercourse.  FINDING OUT THE RESULTS OF YOUR TEST Not all test results are available during your visit. If your or your child's test results are not back during the visit, make an appointment with your caregiver to find out the results. Do not assume everything is normal if you have not heard from your caregiver or the medical facility. It is important for you to follow up on all test results. SEEK MEDICAL CARE IF:   There is back pain.   Your baby is older than 3 months with a rectal temperature of 100.5 F (38.1 C) or higher for more than 1 day.   Your or your child's problems (symptoms) are no better in 3 days. Return sooner if you or your child is getting worse.  SEEK IMMEDIATE MEDICAL CARE IF:   There is severe back pain or lower  abdominal pain.   You or your child develops chills.   You have a fever.   Your baby is older than 3 months with a rectal temperature of 102 F (38.9 C) or higher.   Your baby is 88 months old or younger with a rectal temperature of 100.4 F (38 C) or higher.   There is nausea or vomiting.   There is continued burning or discomfort with urination.  MAKE SURE YOU:   Understand these instructions.   Will watch your condition.   Will get help right away if you are not doing well or get worse.  Document Released: 12/03/2004 Document Revised: 02/12/2011 Document Reviewed: 07/08/2006 [facility] Patient Information 2012 [place], [place].   RESOURCE GUIDE  Dental Problems  Patients with Medicaid: [facility]                     [phone] [address]                                           Phone:  [phone]                                                  If unable to pay  or uninsured, contact:  [facility] or [facility]. to become qualified for the adult dental clinic.  Chronic Pain Problems Contact [facility]  [phone] Patients need to be referred by their primary care doctor.  Insufficient Money for Medicine Contact [employer] Way:  call "211" or [facility] 820-203-2901.  No Primary Care Doctor Call [facility]  [phone] Other agencies that provide inexpensive medical care    [facility]  [phone]    [facility]  [phone]    [facility]  [phone]    [facility]  [phone]    [facility]  8193070397    [facility]  [phone]  Substance Abuse Resources [facility] Drug Services  769-698-3200 [facility] 212-500-0595 [facility] [phone] Floydene Flock [phone] Residential & Outpatient Substance Abuse Program  (682) [phone]  Psychological Services [facility]  [phone] [facility]  [phone] [facility]   [phone] (emergency services (386)[phone])  Abuse/Neglect [facility] Child Abuse Hotline (515)047-1640 [facility] Child Abuse Hotline 929-775-8885 (After Hours)  Emergency Shelter [facility] (873) [phone]  Maternity Homes [facility] [place] [facility] [phone] [facility] Alert Services 5132612126  MRSA Hotline #:   252-101-9381    [facility]  [facility]  [employer]                           [place] [address] [place]                     [address]         [address] [place] [address]  Blondell Reveal Phone:  [phone]                                  Phone:  [phone]                   Phone:  [phone]  [facility] Phone:  [phone]  [facility] Child Abuse Hotline (585)458-1334 [phone] (After Hours)

## 2011-06-29 NOTE — ED Provider Notes (Signed)
Medical screening examination/treatment/procedure(s) were performed by non-physician practitioner and as supervising physician I was immediately available for consultation/collaboration.  Jasmine Awe, MD 06/29/11 [phone]

## 2011-07-01 LAB — URINE CULTURE: Culture  Setup Time: 201304220035

## 2011-07-02 NOTE — ED Notes (Signed)
+  Urine. Patient treated with Macrobid. Sensitive to same. Per protocol MD. °

## 2011-07-06 ENCOUNTER — Telehealth: Payer: Self-pay | Admitting: Nurse Practitioner

## 2011-07-06 NOTE — Telephone Encounter (Signed)
Returned call, ecardio/ nsr, spoke with Lawson Fiscal and she said pt can follow up as needed as long as she is feeling better, pt being treated for uti, pt denies cp, sob, dizziness. Discussed health risk maintenance, told to diet/ exercise/ reduce caffeine. Pt to call when needs to be seen. Pt verbalized understanding.

## 2011-07-06 NOTE — Telephone Encounter (Signed)
Please return call to patient at  [phone]  Patient never received results of E-Cardio Monitor test .

## 2011-07-06 NOTE — Telephone Encounter (Signed)
Patient called wanting to know results of E Cardio monitor.Stated she mailed it back in 06/15/11.Fowarded to Dr.Nahser's nurse.

## 2011-07-31 ENCOUNTER — Encounter: Payer: Self-pay | Admitting: Gastroenterology

## 2011-08-14 ENCOUNTER — Encounter (HOSPITAL_COMMUNITY): Payer: Self-pay | Admitting: Emergency Medicine

## 2011-08-14 ENCOUNTER — Emergency Department (HOSPITAL_COMMUNITY)
Admission: EM | Admit: 2011-08-14 | Discharge: 2011-08-14 | Disposition: A | Discharge status: Home / Self Care | Payer: BC Managed Care – PPO | Source: Home / Self Care | Attending: Emergency Medicine | Admitting: Emergency Medicine

## 2011-08-14 DIAGNOSIS — L259 Unspecified contact dermatitis, unspecified cause: Secondary | ICD-10-CM

## 2011-08-14 DIAGNOSIS — L239 Allergic contact dermatitis, unspecified cause: Secondary | ICD-10-CM

## 2011-08-14 MED ORDER — METHYLPREDNISOLONE 4 MG PO KIT: PACK | ORAL | Status: AC

## 2011-08-14 MED ORDER — FAMOTIDINE 20 MG PO TABS: 20.0000 mg | ORAL_TABLET | Freq: Two times a day (BID) | ORAL | Status: DC

## 2011-08-14 MED ORDER — BETAMETHASONE VALERATE 0.12 % EX FOAM: 1.0000 "application " | Freq: Two times a day (BID) | CUTANEOUS | Status: DC

## 2011-08-14 MED ORDER — EPINEPHRINE 0.3 MG/0.3ML IJ DEVI: 0.3000 mg | Freq: Once | INTRAMUSCULAR | Status: DC

## 2011-08-14 NOTE — ED Notes (Signed)
PT HERE WITH C/O PROGRESSIVE RASH THAT STARTED IN BOTH UPPER ARMS WITH SPREADING TO LOWER ABD AND LEGS SINCE Wednesday. C/O ITCHING BUT DENIES PAIN.OTC CREAM OR BENADRYL EFFECTIVE.

## 2011-08-14 NOTE — ED Provider Notes (Signed)
History     CSN: [id number]  Arrival date & time 08/14/11  1456   First MD Initiated Contact with Patient 08/14/11 1502      Chief Complaint  Patient presents with  . Rash    (Consider location/radiation/quality/duration/timing/severity/associated sxs/prior treatment) HPI Comments: patient states she just started using a new body/hair oil, and noticed a erythematous, papular, itchy rash in her armpits, torso, upper thighs. States that her scalp also itches where she put the oil. There is no rash in the areas where she is unable to reach. She also states that her throat feels swollen, and mild shortness of breath starting last night. She been taking Benadryl, Claritin and using an anti-itch cream with moderate improvement. No voice changes, wheezing, coughing, angioedema, facial swelling. No abdominal pain, diarrhea.  No nausea, vomiting, fevers. No other new lotions, soaps, detergents. No known exposure to poison ivy. No new medications or foods. No contacts with similar rash.  Patient is a 46 y.o. female presenting with rash. The history is provided by the patient. No language interpreter was used.  Rash  This is a new problem. The current episode started more than 2 days ago. The problem has been gradually worsening. The problem is associated with a new detergent/soap. There has been no fever. The rash is present on the scalp, torso, right upper leg, left upper leg and abdomen. Associated symptoms include itching. Pertinent negatives include no blisters, no pain and no weeping. She has tried antihistamines and anti-itch cream for the symptoms. The treatment provided moderate relief. Risk factors include new environmental exposures.    Past Medical History  Diagnosis Date  . Hypertension   . Sleep apnea     does not wear CPAP  . GERD (gastroesophageal reflux disease)   . Arthritis   . Hiatal hernia   . Morbid obesity     Past Surgical History  Procedure Date  . Abdominal  hysterectomy   . Cardiovascular stress test 07/25/2007    Negative stress echo; ef 55-60%    Family History  Problem Relation Age of Onset  . Cervical cancer Mother   . Hypertension Sister   . Diabetes Sister   . Heart disease Neg Hx     History  Substance Use Topics  . Smoking status: Never Smoker   . Smokeless tobacco: Not on file  . Alcohol Use: No    OB History    Grav Para Term Preterm Abortions TAB SAB Ect Mult Living                  Review of Systems  Skin: Positive for itching and rash.    Allergies  Oxycodone  Home Medications   Current Outpatient Rx  Name Route Sig Dispense Refill  . LORATADINE 10 MG PO TABS Oral Take 10 mg by mouth daily.    Marland Kitchen METOPROLOL SUCCINATE ER 50 MG PO TB24 Oral Take 1 tablet (50 mg total) by mouth daily. 30 tablet 5  . BETAMETHASONE VALERATE 0.12 % EX FOAM Topical Apply 1 application topically 2 (two) times daily. Until symptoms resolve 100 g 0  . CALCIUM CARBONATE-VITAMIN D 600-400 MG-UNIT PO CHEW Oral Chew 1 tablet by mouth daily.    Marland Kitchen EPINEPHRINE 0.3 MG/0.3ML IJ DEVI Intramuscular Inject 0.3 mLs (0.3 mg total) into the muscle once. 1 Device 1  . FAMOTIDINE 20 MG PO TABS Oral Take 1 tablet (20 mg total) by mouth 2 (two) times daily. 10 tablet 0  . METHYLPREDNISOLONE 4  MG PO KIT  follow package directions 21 tablet 0  . OMEPRAZOLE 40 MG PO CPDR Oral Take 40 mg by mouth as needed. For heartburn.      BP 139/92  Pulse 68  Temp(Src) 98.4 F (36.9 C) (Oral)  Resp 18  SpO2 97%  Physical Exam  Nursing note and vitals reviewed. Constitutional: She is oriented to person, place, and time. She appears well-developed and well-nourished. No distress.  HENT:  Head: Normocephalic and atraumatic.  Mouth/Throat: Oropharynx is clear and moist.       Airway widely patent. No angioedema  Eyes: Conjunctivae and EOM are normal.  Neck: Normal range of motion.  Cardiovascular: Normal rate, regular rhythm and normal heart sounds.     Pulmonary/Chest: Effort normal and breath sounds normal. She has no wheezes.  Abdominal: Bowel sounds are normal. She exhibits no distension.  Musculoskeletal: Normal range of motion.  Neurological: She is alert and oriented to person, place, and time.  Skin: Skin is warm and dry. Rash noted.       Erythematous, papular rash consistent with contact  otitis and eczema, abdomen, upper thighs in the scalp. Positive excoriations.  Psychiatric: She has a normal mood and affect. Her behavior is normal. Judgment and thought content normal.    ED Course  Procedures (including critical care time)  Labs Reviewed - No data to display No results found.   1. Contact allergic reaction     MDM  Given patient's history of sensation of throat swelling shut, appears to be a mild allergic reaction. No evidence of anaphylaxis at this time. Vitals are normal, voice normal, airway patent. will send her home with some steroids, Pepcid, and have her continue her Claritin. Topical steroids for the scalp. Also give her an EpiPen for future use. Discussed the symptoms that should prompt return to the ED. She agrees with plan.  Luiz Blare, MD 08/14/11 2132

## 2011-08-14 NOTE — Discharge Instructions (Signed)
[facility]: [address] [place] [address] [phone]  [facility]: [address] [place] [address]   [phone]  [facility]: [address] [place] [address] [phone]  [facility] : [address] [place] [address] [phone]  [facility]: [address] [place] [address] [phone]  [facility]: [address] [place] [address] [phone]  Dr. Mahima Pandey [address] [facility]  [place] [address] [phone]  

## 2011-08-27 ENCOUNTER — Ambulatory Visit (INDEPENDENT_AMBULATORY_CARE_PROVIDER_SITE_OTHER): Payer: BC Managed Care – PPO | Admitting: Gastroenterology

## 2011-08-27 ENCOUNTER — Encounter: Payer: Self-pay | Admitting: Gastroenterology

## 2011-08-27 VITALS — BP 112/80 | HR 64 | Ht 65.0 in | Wt 311.2 lb

## 2011-08-27 DIAGNOSIS — IMO0001 Reserved for inherently not codable concepts without codable children: Secondary | ICD-10-CM

## 2011-08-27 DIAGNOSIS — K219 Gastro-esophageal reflux disease without esophagitis: Secondary | ICD-10-CM

## 2011-08-27 MED ORDER — DEXLANSOPRAZOLE 60 MG PO CPDR: 60.0000 mg | DELAYED_RELEASE_CAPSULE | Freq: Every day | ORAL | Status: DC

## 2011-08-27 NOTE — Patient Instructions (Addendum)
You were shown the movie on GERD today. Your prescription for Dexilant has been sent to your pharmacy. Samples of Dexilant have also been given to you.  Please take 1 pill daily 20-30 minutes before breakfast. You have been scheduled for an Endoscopy and instructions have been provided.

## 2011-08-27 NOTE — Progress Notes (Signed)
History of Present Illness:  This is a very nice 46 year old African American female with years of chronic acid reflux manifested by regurgitation, burning substernal chest pain, globus sensation, and excessive mucus in her throat area. She has not had previous endoscopy, but had a negative colonoscopy by Dr. Loreta Ave 2005 at which time she had iron deficiency. She denies melena or hematochezia, anemia, and has had hysterectomy. Review of her labs shows normal CBC a metabolic profile. She has had chronic obesity for many years but denies any specific food intolerances or bowel or regularity. She's had numerous imaging studies which are been unremarkable without evidence of cholelithiasis. Family history is noncontributory.  I have reviewed this patient's present history, medical and surgical past history, allergies and medications.     ROS: The remainder of the 10 point ROS is negative     Physical Exam: Obese patient in no acute distress. Blood pressure 122/66, pulse 88 and regular, weight 293 pounds with a BMI of 42.09. General well developed well nourished patient in no acute distress, appearing their stated age Eyes PERRLA, no icterus, fundoscopic exam per opthamologist Skin no lesions noted Neck supple, no adenopathy, no thyroid enlargement, no tenderness Chest clear to percussion and auscultation Heart no significant murmurs, gallops or rubs noted Abdomen no hepatosplenomegaly masses or tenderness, BS normal.  Rectal inspection normal no fissures, or fistulae noted.  No masses or tenderness on digital exam. Stool guaiac negative. Extremities no acute joint lesions, edema, phlebitis or evidence of cellulitis. Neurologic patient oriented x 3, cranial nerves intact, no focal neurologic deficits noted. Psychological mental status normal and normal affect.  Assessment and plan: Morbidly obese patient with chronic GERD and secondary globus reaction. She has not had previous screening for  Barrett's mucosa, and we have scheduled endoscopic exam. I have placed her on Dexilant 60 mg a day, have shown her patient education video on acid reflux. She is due for colonoscopy followup in 2 years time. She is to continue other medications as per primary care. Encounter Diagnosis  Name Primary?  . Reflux Yes

## 2011-09-07 ENCOUNTER — Ambulatory Visit (AMBULATORY_SURGERY_CENTER): Payer: BC Managed Care – PPO | Admitting: Gastroenterology

## 2011-09-07 ENCOUNTER — Encounter: Payer: Self-pay | Admitting: Gastroenterology

## 2011-09-07 ENCOUNTER — Other Ambulatory Visit: Payer: Self-pay

## 2011-09-07 VITALS — HR 67 | Temp 97.8°F | Resp 19 | Ht 65.0 in | Wt 311.0 lb

## 2011-09-07 DIAGNOSIS — K2100 Gastro-esophageal reflux disease with esophagitis, without bleeding: Secondary | ICD-10-CM

## 2011-09-07 DIAGNOSIS — K219 Gastro-esophageal reflux disease without esophagitis: Secondary | ICD-10-CM

## 2011-09-07 MED ORDER — SODIUM CHLORIDE 0.9 % IV SOLN: 500.0000 mL | INTRAVENOUS | Status: DC

## 2011-09-07 MED ORDER — PANTOPRAZOLE SODIUM 40 MG PO TBEC: 40.0000 mg | DELAYED_RELEASE_TABLET | Freq: Every day | ORAL | Status: DC

## 2011-09-07 NOTE — Progress Notes (Signed)
Patient did not experience any of the following events: a burn prior to discharge; a fall within the facility; wrong site/side/patient/procedure/implant event; or a hospital transfer or hospital admission upon discharge from the facility. (G8907) Patient did not have preoperative order for IV antibiotic SSI prophylaxis. (G8918)  

## 2011-09-07 NOTE — Progress Notes (Signed)
INFORMED SHELIA CAMP CRNA OF BMI 51.79. MS. CAMP WILL SEE PATIENT.

## 2011-09-07 NOTE — Progress Notes (Signed)
The pt tolerated the egd very well. Maw   

## 2011-09-07 NOTE — Op Note (Signed)
[facility] [address] [place], [place]  [address]  ENDOSCOPY PROCEDURE REPORT  PATIENT:  [name], [name]  MR#:  [id number] BIRTHDATE:  1965-05-01, 46 yrs. old  GENDER:  female  ENDOSCOPIST:  Vania Rea. Jarold Motto, MD, [facility] Referred by:  PROCEDURE DATE:  09/07/2011 PROCEDURE:  EGD with biopsy, 43239, EGD with biopsy for H. pylori 19147 ASA CLASS:  Class II INDICATIONS:  REFRACTORY GERD  MEDICATIONS:   propofol (Diprivan) 180 mg IV TOPICAL ANESTHETIC:  DESCRIPTION OF PROCEDURE:   After the risks and benefits of the procedure were explained, informed consent was obtained.  The LB [id number] endoscope was introduced through the mouth and advanced to the second portion of the duodenum.  The instrument was slowly withdrawn as the mucosa was fully examined. <<PROCEDUREIMAGES>>  The upper, middle, and distal third of the esophagus were carefully inspected and no abnormalities were noted. The z-line was well seen at the GEJ. The endoscope was pushed into the fundus which was normal including a retroflexed view. The antrum,gastric body, first and second part of the duodenum were unremarkable. CLO BX. AND ESOPHAGEAL BIOPSIES DONE.    Retroflexed views revealed no abnormalities.    The scope was then withdrawn from the patient and the procedure completed.  COMPLICATIONS:  None  ENDOSCOPIC IMPRESSION: 1) Normal EGD TREATED GERD,R/O EOSINOPHILIC ESOPHAGITIS. RECOMMENDATIONS: 1) Await biopsy results 2) Rx CLO if positive 3) continue PPI  ______________________________ Vania Rea. Jarold Motto, MD, [facility]  CC:  n. eSIGNED:   Vania Rea. Giselle Brutus at 09/07/2011 02:21 PM  [name], [phone]

## 2011-09-07 NOTE — Patient Instructions (Addendum)
YOU HAD AN ENDOSCOPIC PROCEDURE TODAY AT THE [facility]: Refer to the procedure report that was given to you for any specific questions about what was found during the examination.  If the procedure report does not answer your questions, please call your gastroenterologist to clarify.  If you requested that your care partner not be given the details of your procedure findings, then the procedure report has been included in a sealed envelope for you to review at your convenience later.  YOU SHOULD EXPECT: Some feelings of bloating in the abdomen. Passage of more gas than usual.  Walking can help get rid of the air that was put into your GI tract during the procedure and reduce the bloating. If you had a lower endoscopy (such as a colonoscopy or flexible sigmoidoscopy) you may notice spotting of blood in your stool or on the toilet paper. If you underwent a bowel prep for your procedure, then you may not have a normal bowel movement for a few days.  DIET: Your first meal following the procedure should be a light meal and then it is ok to progress to your normal diet.  A half-sandwich or bowl of soup is an example of a good first meal.  Heavy or fried foods are harder to digest and may make you feel nauseous or bloated.  Likewise meals heavy in dairy and vegetables can cause extra gas to form and this can also increase the bloating.  Drink plenty of fluids but you should avoid alcoholic beverages for 24 hours.  ACTIVITY: Your care partner should take you home directly after the procedure.  You should plan to take it easy, moving slowly for the rest of the day.  You can resume normal activity the day after the procedure however you should NOT DRIVE or use heavy machinery for 24 hours (because of the sedation medicines used during the test).    SYMPTOMS TO REPORT IMMEDIATELY: A gastroenterologist can be reached at any hour.  During normal business hours, 8:30 AM to 5:00 PM Monday through Friday,  call [phone].  After hours and on weekends, please call the GI answering service at [phone] who will take a message and have the physician on call contact you.    Following upper endoscopy (EGD)  Vomiting of blood or coffee ground material  New chest pain or pain under the shoulder blades  Painful or persistently difficult swallowing  New shortness of breath  Fever of 100F or higher  Black, tarry-looking stools  FOLLOW UP: If any biopsies were taken you will be contacted by phone or by letter within the next 1-3 weeks.  Call your gastroenterologist if you have not heard about the biopsies in 3 weeks.  Our staff will call the home number listed on your records the next business day following your procedure to check on you and address any questions or concerns that you may have at that time regarding the information given to you following your procedure. This is a courtesy call and so if there is no answer at the home number and we have not heard from you through the emergency physician on call, we will assume that you have returned to your regular daily activities without incident.  Resume medications. Discharge instructions completed with family. SIGNATURES/CONFIDENTIALITY: You and/or your care partner have signed paperwork which will be entered into your electronic medical record.  These signatures attest to the fact that that the information above on your After Visit Summary has  been reviewed and is understood.  Full responsibility of the confidentiality of this discharge information lies with you and/or your care-partner.  

## 2011-09-08 ENCOUNTER — Telehealth: Payer: Self-pay

## 2011-09-08 NOTE — Telephone Encounter (Signed)
Left a message on the pt's answering machine for the pt to call if there is any questions or concerns. Maw

## 2011-09-12 ENCOUNTER — Encounter (HOSPITAL_COMMUNITY): Payer: Self-pay | Admitting: *Deleted

## 2011-09-12 DIAGNOSIS — R319 Hematuria, unspecified: Secondary | ICD-10-CM | POA: Insufficient documentation

## 2011-09-12 DIAGNOSIS — R300 Dysuria: Secondary | ICD-10-CM | POA: Insufficient documentation

## 2011-09-12 DIAGNOSIS — R109 Unspecified abdominal pain: Secondary | ICD-10-CM | POA: Insufficient documentation

## 2011-09-12 LAB — URINE MICROSCOPIC-ADD ON

## 2011-09-12 LAB — URINALYSIS, ROUTINE W REFLEX MICROSCOPIC
Glucose, UA: NEGATIVE mg/dL
Ketones, ur: 15 mg/dL — AB
pH: 5.5 (ref 5.0–8.0)

## 2011-09-12 LAB — POCT PREGNANCY, URINE: Preg Test, Ur: NEGATIVE

## 2011-09-12 NOTE — ED Notes (Signed)
Pt states she believes she has a bladder infection. Pt started having blood in her urine and burnging pressure with her pee yesterday. Pt states abdominal pain today lower across pelvis with frequency.

## 2011-09-13 ENCOUNTER — Inpatient Hospital Stay (HOSPITAL_COMMUNITY)
Admission: AD | Admit: 2011-09-13 | Discharge: 2011-09-13 | Disposition: A | Discharge status: Home / Self Care | Payer: BC Managed Care – PPO | Source: Ambulatory Visit | Attending: Obstetrics & Gynecology | Admitting: Obstetrics & Gynecology

## 2011-09-13 ENCOUNTER — Emergency Department (HOSPITAL_COMMUNITY)
Admission: EM | Admit: 2011-09-13 | Discharge: 2011-09-13 | Disposition: A | Discharge status: Home / Self Care | Payer: BC Managed Care – PPO | Attending: Emergency Medicine | Admitting: Emergency Medicine

## 2011-09-13 ENCOUNTER — Encounter (HOSPITAL_COMMUNITY): Payer: Self-pay

## 2011-09-13 DIAGNOSIS — R300 Dysuria: Secondary | ICD-10-CM | POA: Insufficient documentation

## 2011-09-13 DIAGNOSIS — N390 Urinary tract infection, site not specified: Secondary | ICD-10-CM | POA: Insufficient documentation

## 2011-09-13 DIAGNOSIS — R109 Unspecified abdominal pain: Secondary | ICD-10-CM | POA: Insufficient documentation

## 2011-09-13 MED ORDER — CIPROFLOXACIN HCL 500 MG PO TABS: 500.0000 mg | ORAL_TABLET | Freq: Two times a day (BID) | ORAL | Status: AC

## 2011-09-13 NOTE — ED Notes (Signed)
Called x2 in general waiting area, triage waiting area, and restrooms in triage and general waiting; no answer.

## 2011-09-13 NOTE — MAU Provider Note (Signed)
History     CSN: [id number]  Arrival date & time 09/13/11  0810   None     Chief Complaint  Patient presents with  . Dysuria  . Abdominal Pain    HPI [name] is a 46 y.o. female who presents to MAU for lower abdominal pain and pain with urination. She describes the pain as dull and constant but then times when there is a sharp pain. Feeling of needing to urinate all the time. Frequent urination. Went to the ED at [facility] but after 5 hours decided to go home. Took azo and got some relief. The symptoms started 3 days ago. Feels like previous UTI's. Last UTI was in May. Last pap smear 2 years ago and was normal. Denies vaginal discharge or bleeding. Hysterectomy 2003. The history was provided by the patient.    Past Medical History  Diagnosis Date  . Hypertension   . Sleep apnea     does not wear CPAP  . GERD (gastroesophageal reflux disease)   . Arthritis   . Morbid obesity   . Hiatal hernia     Past Surgical History  Procedure Date  . Abdominal hysterectomy   . Cardiovascular stress test 07/25/2007    Negative stress echo; ef 55-60%  . Knee arthroscopy 01/2011    left    Family History  Problem Relation Age of Onset  . Cervical cancer Mother   . Hypertension Sister   . Diabetes Sister   . Heart disease Neg Hx     History  Substance Use Topics  . Smoking status: Never Smoker   . Smokeless tobacco: Never Used  . Alcohol Use: No    OB History    Grav Para Term Preterm Abortions TAB SAB Ect Mult Living                  Review of Systems  Constitutional: Negative for fever, chills, diaphoresis and fatigue.  HENT: Negative for ear pain, congestion, sore throat, facial swelling, neck pain, neck stiffness, dental problem and sinus pressure.   Eyes: Negative for photophobia, pain, discharge and visual disturbance.  Respiratory: Negative for cough, chest tightness and wheezing.   Cardiovascular: Negative for chest pain and palpitations.    Gastrointestinal: Positive for nausea and abdominal pain. Negative for vomiting, diarrhea, constipation and abdominal distention.  Genitourinary: Positive for urgency and frequency. Negative for flank pain, vaginal bleeding, vaginal discharge and difficulty urinating.  Musculoskeletal: Positive for back pain. Negative for myalgias and gait problem.  Skin: Negative for color change and rash.  Neurological: Negative for dizziness, speech difficulty, weakness, light-headedness, numbness and headaches.  Psychiatric/Behavioral: Negative for confusion and agitation.    Allergies  Oxycodone  Home Medications  No current outpatient prescriptions on file.  BP 142/69  Pulse 73  Temp 98.3 F (36.8 C) (Oral)  Resp 16  Ht 5\' 5"  (1.651 m)  Wt 311 lb (141.069 kg)  BMI 51.75 kg/m2  Physical Exam  Nursing note and vitals reviewed. Constitutional: She is oriented to person, place, and time. She appears well-developed and well-nourished.  HENT:  Head: Normocephalic.  Eyes: EOM are normal.  Neck: Neck supple.  Cardiovascular: Normal rate.   Pulmonary/Chest: Effort normal.  Abdominal: Soft. There is tenderness in the suprapubic area. There is no rigidity, no rebound, no guarding and no CVA tenderness.       Patient has tenderness on exam right lumbar area, but no CVA tenderness.  Musculoskeletal: Normal range of motion.  Neurological:  She is alert and oriented to person, place, and time. No cranial nerve deficit.  Skin: Skin is warm and dry.  Psychiatric: She has a normal mood and affect. Her behavior is normal. Judgment and thought content normal.   Results for orders placed during the hospital encounter of 09/13/11 (from the past 24 hour(s))  URINALYSIS, ROUTINE W REFLEX MICROSCOPIC     Status: Abnormal   Collection Time   09/12/11 10:26 PM      Component Value Range   Color, Urine ORANGE (*) YELLOW   APPearance CLOUDY (*) CLEAR   Specific Gravity, Urine 1.013  1.005 - 1.030   pH 5.5  5.0  - 8.0   Glucose, UA NEGATIVE  NEGATIVE mg/dL   Hgb urine dipstick TRACE (*) NEGATIVE   Bilirubin Urine NEGATIVE  NEGATIVE   Ketones, ur 15 (*) NEGATIVE mg/dL   Protein, ur 30 (*) NEGATIVE mg/dL   Urobilinogen, UA 2.0 (*) 0.0 - 1.0 mg/dL   Nitrite POSITIVE (*) NEGATIVE   Leukocytes, UA MODERATE (*) NEGATIVE  URINE MICROSCOPIC-ADD ON     Status: Abnormal   Collection Time   09/12/11 10:26 PM      Component Value Range   Squamous Epithelial / LPF MANY (*) RARE   WBC, UA 21-50  <3 WBC/hpf   RBC / HPF 3-6  <3 RBC/hpf   Bacteria, UA MANY (*) RARE  POCT PREGNANCY, URINE     Status: Normal   Collection Time   09/12/11 10:34 PM      Component Value Range   Preg Test, Ur NEGATIVE  NEGATIVE    Assessment: UTI  Plan:  Cipro 500 mg po bid x 10 days Rx   Pyridium Rx   Urine sent for culture  I discussed with the patient urine results and need for follow up with her PCP. Discussed with patient if she develops fever, persistent vomiting or other problems she should return here. Patient voices understanding.  ED Course  Procedures   MDM

## 2011-09-13 NOTE — MAU Note (Signed)
Patient was at [facility] ED last night left without being seeing by MD, have UA results and POCT, having dysuria and abdominal pain.

## 2011-09-13 NOTE — MAU Note (Signed)
Patient c/o dysuria since Friday.

## 2011-09-14 ENCOUNTER — Encounter: Payer: Self-pay | Admitting: Gastroenterology

## 2011-09-15 LAB — URINE CULTURE

## 2011-09-17 ENCOUNTER — Encounter (HOSPITAL_BASED_OUTPATIENT_CLINIC_OR_DEPARTMENT_OTHER): Payer: Self-pay | Admitting: *Deleted

## 2011-09-17 ENCOUNTER — Emergency Department (HOSPITAL_BASED_OUTPATIENT_CLINIC_OR_DEPARTMENT_OTHER)
Admission: EM | Admit: 2011-09-17 | Discharge: 2011-09-17 | Disposition: A | Discharge status: Home / Self Care | Payer: BC Managed Care – PPO | Attending: Emergency Medicine | Admitting: Emergency Medicine

## 2011-09-17 DIAGNOSIS — K449 Diaphragmatic hernia without obstruction or gangrene: Secondary | ICD-10-CM | POA: Insufficient documentation

## 2011-09-17 DIAGNOSIS — K219 Gastro-esophageal reflux disease without esophagitis: Secondary | ICD-10-CM | POA: Insufficient documentation

## 2011-09-17 DIAGNOSIS — M129 Arthropathy, unspecified: Secondary | ICD-10-CM | POA: Insufficient documentation

## 2011-09-17 DIAGNOSIS — I10 Essential (primary) hypertension: Secondary | ICD-10-CM | POA: Insufficient documentation

## 2011-09-17 DIAGNOSIS — Z885 Allergy status to narcotic agent status: Secondary | ICD-10-CM | POA: Insufficient documentation

## 2011-09-17 DIAGNOSIS — Z90710 Acquired absence of both cervix and uterus: Secondary | ICD-10-CM | POA: Insufficient documentation

## 2011-09-17 DIAGNOSIS — Z833 Family history of diabetes mellitus: Secondary | ICD-10-CM | POA: Insufficient documentation

## 2011-09-17 DIAGNOSIS — X58XXXA Exposure to other specified factors, initial encounter: Secondary | ICD-10-CM | POA: Insufficient documentation

## 2011-09-17 DIAGNOSIS — Z8049 Family history of malignant neoplasm of other genital organs: Secondary | ICD-10-CM | POA: Insufficient documentation

## 2011-09-17 DIAGNOSIS — G4730 Sleep apnea, unspecified: Secondary | ICD-10-CM | POA: Insufficient documentation

## 2011-09-17 DIAGNOSIS — N390 Urinary tract infection, site not specified: Secondary | ICD-10-CM | POA: Insufficient documentation

## 2011-09-17 DIAGNOSIS — Z8249 Family history of ischemic heart disease and other diseases of the circulatory system: Secondary | ICD-10-CM | POA: Insufficient documentation

## 2011-09-17 DIAGNOSIS — S335XXA Sprain of ligaments of lumbar spine, initial encounter: Secondary | ICD-10-CM | POA: Insufficient documentation

## 2011-09-17 DIAGNOSIS — S39012A Strain of muscle, fascia and tendon of lower back, initial encounter: Secondary | ICD-10-CM

## 2011-09-17 LAB — URINALYSIS, ROUTINE W REFLEX MICROSCOPIC
Nitrite: NEGATIVE
Specific Gravity, Urine: 1.008 (ref 1.005–1.030)
Urobilinogen, UA: 0.2 mg/dL (ref 0.0–1.0)

## 2011-09-17 MED ORDER — IBUPROFEN 400 MG PO TABS
600.0000 mg | ORAL_TABLET | Freq: Once | ORAL | Status: AC
  Administered 2011-09-17: 600 mg via ORAL
  Filled 2011-09-17: qty 1

## 2011-09-17 MED ORDER — IBUPROFEN 600 MG PO TABS: 600.0000 mg | ORAL_TABLET | Freq: Four times a day (QID) | ORAL | Status: DC | PRN

## 2011-09-17 NOTE — ED Provider Notes (Signed)
History     CSN: [id number]  Arrival date & time 09/17/11  7829   First MD Initiated Contact with Patient 09/17/11 1016      Chief Complaint  Patient presents with  . Urinary Tract Infection  . Back Pain    (Consider location/radiation/quality/duration/timing/severity/associated sxs/prior treatment) HPI Pt states she was dx and treated for UTI 5 days ago. UTi symptoms have improved but she return because she told to come back for back pain. Pt states she has had gradual onset bl paraspinal lumbar back pain. No fever, chills, N/V, dysuria, urgency. Pain worse with movement. No weakness, or sensory changes Past Medical History  Diagnosis Date  . Hypertension   . Sleep apnea     does not wear CPAP  . GERD (gastroesophageal reflux disease)   . Arthritis   . Morbid obesity   . Hiatal hernia     Past Surgical History  Procedure Date  . Abdominal hysterectomy   . Cardiovascular stress test 07/25/2007    Negative stress echo; ef 55-60%  . Knee arthroscopy 01/2011    left    Family History  Problem Relation Age of Onset  . Cervical cancer Mother   . Hypertension Sister   . Diabetes Sister   . Heart disease Neg Hx     History  Substance Use Topics  . Smoking status: Never Smoker   . Smokeless tobacco: Never Used  . Alcohol Use: No    OB History    Grav Para Term Preterm Abortions TAB SAB Ect Mult Living                  Review of Systems  Constitutional: Negative for fever, chills and fatigue.  Gastrointestinal: Negative for nausea, vomiting and abdominal pain.  Genitourinary: Negative for dysuria, frequency, hematuria, flank pain and difficulty urinating.  Musculoskeletal: Positive for myalgias and back pain. Negative for joint swelling, arthralgias and gait problem.  Skin: Negative for rash and wound.  Neurological: Negative for weakness and numbness.    Allergies  Oxycodone  Home Medications   Current Outpatient Rx  Name Route Sig Dispense Refill  .  ACETAMINOPHEN 500 MG PO TABS Oral Take 1,000 mg by mouth every 6 (six) hours as needed. For pain    . AZO-CRANBERRY PO Oral Take 2 tablets by mouth 3 (three) times daily. For bladder infection    . CIPROFLOXACIN HCL 500 MG PO TABS Oral Take 1 tablet (500 mg total) by mouth every 12 (twelve) hours. 20 tablet 0  . IBUPROFEN 600 MG PO TABS Oral Take 1 tablet (600 mg total) by mouth every 6 (six) hours as needed for pain. 30 tablet 0  . METOPROLOL SUCCINATE ER 50 MG PO TB24 Oral Take 1 tablet (50 mg total) by mouth daily. 30 tablet 5  . PANTOPRAZOLE SODIUM 40 MG PO TBEC Oral Take 1 tablet (40 mg total) by mouth daily. 30 tablet 11    BP 152/98  Pulse 68  Temp 98.1 F (36.7 C) (Oral)  Resp 18  SpO2 97%  Physical Exam  Nursing note and vitals reviewed. Constitutional: She is oriented to person, place, and time. She appears well-developed and well-nourished. No distress.  HENT:  Head: Normocephalic and atraumatic.  Mouth/Throat: Oropharynx is clear and moist.  Eyes: EOM are normal. Pupils are equal, round, and reactive to light.  Neck: Normal range of motion. Neck supple.  Cardiovascular: Normal rate and regular rhythm.   Pulmonary/Chest: Effort normal and breath sounds normal. No  respiratory distress. She has no wheezes. She has no rales.  Abdominal: Soft. Bowel sounds are normal. There is no tenderness. There is no rebound and no guarding.  Musculoskeletal: Normal range of motion. She exhibits tenderness (mild TTP over bl lumar paraspinal muscles. No CVAT to percussion). She exhibits no edema.  Neurological: She is alert and oriented to person, place, and time.       5/5 motor, sensation intact  Skin: Skin is warm and dry. No rash noted. No erythema.  Psychiatric: She has a normal mood and affect. Her behavior is normal.    ED Course  Procedures (including critical care time)   Labs Reviewed  URINALYSIS, ROUTINE W REFLEX MICROSCOPIC   No results found.   1. Low back strain        MDM  D/c with antiinflammatory meds for low back strain        Loren Racer, MD 09/17/11 [phone]

## 2011-09-17 NOTE — ED Notes (Signed)
Pt reports a recent UTI and taking PO ABX- Today she reports low back pain

## 2011-09-17 NOTE — ED Notes (Signed)
[facility]i started on cipro states isnt feeling any better and having pain going into her back now

## 2011-09-22 NOTE — MAU Provider Note (Signed)
Attestation of Attending Supervision of Advanced Practitioner (CNM/NP): Evaluation and management procedures were performed by the Advanced Practitioner under my supervision and collaboration.  I have reviewed the Advanced Practitioner's note and chart, and I agree with the management and plan.  Jaynie Collins, M.D. 09/22/2011 8:31 AM

## 2011-10-12 ENCOUNTER — Other Ambulatory Visit: Payer: Self-pay | Admitting: Obstetrics

## 2011-10-12 DIAGNOSIS — Z1231 Encounter for screening mammogram for malignant neoplasm of breast: Secondary | ICD-10-CM

## 2011-10-29 ENCOUNTER — Ambulatory Visit
Admission: RE | Admit: 2011-10-29 | Discharge: 2011-10-29 | Disposition: A | Discharge status: Home / Self Care | Payer: BC Managed Care – PPO | Source: Ambulatory Visit | Attending: Obstetrics | Admitting: Obstetrics

## 2011-10-29 DIAGNOSIS — Z1231 Encounter for screening mammogram for malignant neoplasm of breast: Secondary | ICD-10-CM

## 2011-11-04 ENCOUNTER — Encounter: Payer: Self-pay | Admitting: Internal Medicine

## 2011-11-04 ENCOUNTER — Other Ambulatory Visit (INDEPENDENT_AMBULATORY_CARE_PROVIDER_SITE_OTHER): Payer: BC Managed Care – PPO

## 2011-11-04 ENCOUNTER — Ambulatory Visit (INDEPENDENT_AMBULATORY_CARE_PROVIDER_SITE_OTHER): Payer: BC Managed Care – PPO | Admitting: Internal Medicine

## 2011-11-04 VITALS — BP 118/80 | HR 75 | Temp 97.1°F | Resp 16 | Ht 65.0 in | Wt 311.5 lb

## 2011-11-04 DIAGNOSIS — I10 Essential (primary) hypertension: Secondary | ICD-10-CM

## 2011-11-04 DIAGNOSIS — N390 Urinary tract infection, site not specified: Secondary | ICD-10-CM

## 2011-11-04 DIAGNOSIS — D72829 Elevated white blood cell count, unspecified: Secondary | ICD-10-CM

## 2011-11-04 DIAGNOSIS — Z0000 Encounter for general adult medical examination without abnormal findings: Secondary | ICD-10-CM

## 2011-11-04 LAB — CBC WITH DIFFERENTIAL/PLATELET
Basophils Absolute: 0.1 10*3/uL (ref 0.0–0.1)
Basophils Relative: 0.6 % (ref 0.0–3.0)
Eosinophils Absolute: 0.4 10*3/uL (ref 0.0–0.7)
HCT: 38.7 % (ref 36.0–46.0)
Hemoglobin: 12.4 g/dL (ref 12.0–15.0)
Lymphocytes Relative: 32.8 % (ref 12.0–46.0)
Lymphs Abs: 4.5 10*3/uL — ABNORMAL HIGH (ref 0.7–4.0)
MCHC: 32 g/dL (ref 30.0–36.0)
MCV: 85.4 fl (ref 78.0–100.0)
Neutro Abs: 7.8 10*3/uL — ABNORMAL HIGH (ref 1.4–7.7)
RBC: 4.54 Mil/uL (ref 3.87–5.11)
RDW: 14.5 % (ref 11.5–14.6)

## 2011-11-04 LAB — URINALYSIS, ROUTINE W REFLEX MICROSCOPIC
Hgb urine dipstick: NEGATIVE
Nitrite: NEGATIVE
Specific Gravity, Urine: <=1.005 (ref 1.000–1.030)
Total Protein, Urine: NEGATIVE
Urine Glucose: NEGATIVE
pH: 6.5 (ref 5.0–8.0)

## 2011-11-04 LAB — COMPREHENSIVE METABOLIC PANEL
AST: 16 U/L (ref 0–37)
BUN: 12 mg/dL (ref 6–23)
CO2: 27 mEq/L (ref 19–32)
Calcium: 9.3 mg/dL (ref 8.4–10.5)
Chloride: 103 mEq/L (ref 96–112)
Creatinine, Ser: 0.7 mg/dL (ref 0.4–1.2)
GFR: 121.17 mL/min (ref >60.00)
Total Bilirubin: 0.7 mg/dL (ref 0.3–1.2)

## 2011-11-04 LAB — LIPID PANEL
Cholesterol: 188 mg/dL (ref 0–200)
LDL Cholesterol: 119 mg/dL — ABNORMAL HIGH (ref 0–99)
Triglycerides: 89 mg/dL (ref 0.0–149.0)

## 2011-11-04 LAB — TSH: TSH: 1.19 u[IU]/mL (ref 0.35–5.50)

## 2011-11-04 MED ORDER — CIPROFLOXACIN HCL 250 MG PO TABS: 250.0000 mg | ORAL_TABLET | Freq: Two times a day (BID) | ORAL | Status: AC

## 2011-11-04 NOTE — Assessment & Plan Note (Signed)
Exam done, vaccines were reviewed, labs ordered, pt ed material was given 

## 2011-11-04 NOTE — Progress Notes (Signed)
Subjective:    Patient ID: [name], female    DOB: 09-22-1965, 46 y.o.   MRN: [id number]  Urinary Tract Infection  This is a recurrent problem. The current episode started in the past 7 days. The problem occurs every urination. The problem has been gradually worsening. The quality of the pain is described as burning. The pain is at a severity of 1/10. The patient is experiencing no pain. There has been no fever. The fever has been present for less than 1 day. She is not sexually active. There is a history of pyelonephritis. Associated symptoms include frequency and urgency. Pertinent negatives include no chills, discharge, flank pain, hematuria, hesitancy, nausea, possible pregnancy, sweats or vomiting. She has tried antibiotics (treated one month ago) for the symptoms. The treatment provided mild relief. Her past medical history is significant for recurrent UTIs.      Review of Systems  Constitutional: Negative for fever, chills, diaphoresis, activity change, appetite change, fatigue and unexpected weight change.  HENT: Negative.   Eyes: Negative.   Respiratory: Negative for apnea, cough, choking, chest tightness, shortness of breath, wheezing and stridor.   Cardiovascular: Negative for chest pain, palpitations and leg swelling.  Gastrointestinal: Negative for nausea, vomiting, abdominal pain, diarrhea, constipation, blood in stool and abdominal distention.  Genitourinary: Positive for dysuria, urgency and frequency. Negative for hesitancy, hematuria, flank pain, decreased urine volume, vaginal bleeding, vaginal discharge, enuresis, difficulty urinating, genital sores, vaginal pain, menstrual problem, pelvic pain and dyspareunia.  Musculoskeletal: Negative.   Skin: Negative.   Neurological: Negative.   Hematological: Negative for adenopathy. Does not bruise/bleed easily.  Psychiatric/Behavioral: Negative.        Objective:   Physical Exam  Vitals reviewed. Constitutional: She  is oriented to person, place, and time. She appears well-developed and well-nourished. No distress.  HENT:  Head: Normocephalic and atraumatic.  Mouth/Throat: Oropharynx is clear and moist. No oropharyngeal exudate.  Eyes: Conjunctivae are normal. Right eye exhibits no discharge. Left eye exhibits no discharge. No scleral icterus.  Neck: Normal range of motion. Neck supple. No JVD present. No tracheal deviation present. No thyromegaly present.  Cardiovascular: Normal rate, regular rhythm, normal heart sounds and intact distal pulses.  Exam reveals no gallop and no friction rub.   No murmur heard. Pulmonary/Chest: Effort normal and breath sounds normal. No stridor. No respiratory distress. She has no wheezes. She has no rales. She exhibits no tenderness.  Abdominal: Normal appearance and bowel sounds are normal. She exhibits no shifting dullness, no distension, no pulsatile liver, no fluid wave, no abdominal bruit, no ascites, no pulsatile midline mass and no mass. There is no hepatosplenomegaly. There is no tenderness. There is no rigidity, no rebound, no guarding, no CVA tenderness, no tenderness at McBurney's point and negative Murphy's sign.  Musculoskeletal: Normal range of motion. She exhibits no edema and no tenderness.  Lymphadenopathy:    She has no cervical adenopathy.  Neurological: She is oriented to person, place, and time.  Skin: Skin is warm and dry. No rash noted. She is not diaphoretic. No erythema. No pallor.  Psychiatric: She has a normal mood and affect. Her behavior is normal. Judgment and thought content normal.      Lab Results  Component Value Date   WBC 15.5* 06/28/2011   HGB 13.6 06/29/2011   HCT 40.0 06/29/2011   PLT 288 06/28/2011   GLUCOSE 96 06/29/2011   ALT 14 07/05/2007   AST 14 07/05/2007   NA 143 06/29/2011   K 3.6  06/29/2011   CL 105 06/29/2011   CREATININE 0.70 06/29/2011   BUN 9 06/29/2011   CO2 27 05/12/2011   TSH 1.13 05/12/2011   INR 1.0 07/06/2007   HGBA1C   Value: 5.7 (NOTE)   The ADA recommends the following therapeutic goals for glycemic   control related to Hgb A1C measurement:   Goal of Therapy:   < 7.0% Hgb A1C   Action Suggested:  > 8.0% Hgb A1C   Ref:  Diabetes Care, 22, Suppl. 1, 1999 07/06/2007      Assessment & Plan:

## 2011-11-04 NOTE — Assessment & Plan Note (Signed)
I will check her UA and ur clx and will start cipro

## 2011-11-04 NOTE — Patient Instructions (Addendum)
Preventive Care for Adults, Female A healthy lifestyle and preventive care can promote health and wellness. Preventive health guidelines for women include the following key practices.  A routine yearly physical is a good way to check with your caregiver about your health and preventive screening. It is a chance to share any concerns and updates on your health, and to receive a thorough exam.   Visit your dentist for a routine exam and preventive care every 6 months. Brush your teeth twice a day and floss once a day. Good oral hygiene prevents tooth decay and gum disease.   The frequency of eye exams is based on your age, health, family medical history, use of contact lenses, and other factors. Follow your caregiver's recommendations for frequency of eye exams.   Eat a healthy diet. Foods like vegetables, fruits, whole grains, low-fat dairy products, and lean protein foods contain the nutrients you need without too many calories. Decrease your intake of foods high in solid fats, added sugars, and salt. Eat the right amount of calories for you.Get information about a proper diet from your caregiver, if necessary.   Regular physical exercise is one of the most important things you can do for your health. Most adults should get at least 150 minutes of moderate-intensity exercise (any activity that increases your heart rate and causes you to sweat) each week. In addition, most adults need muscle-strengthening exercises on 2 or more days a week.   Maintain a healthy weight. The body mass index (BMI) is a screening tool to identify possible weight problems. It provides an estimate of body fat based on height and weight. Your caregiver can help determine your BMI, and can help you achieve or maintain a healthy weight.For adults 20 years and older:   A BMI below 18.5 is considered underweight.   A BMI of 18.5 to 24.9 is normal.   A BMI of 25 to 29.9 is considered overweight.   A BMI of 30 and above is  considered obese.   Maintain normal blood lipids and cholesterol levels by exercising and minimizing your intake of saturated fat. Eat a balanced diet with plenty of fruit and vegetables. Blood tests for lipids and cholesterol should begin at age 20 and be repeated every 5 years. If your lipid or cholesterol levels are high, you are over 50, or you are at high risk for heart disease, you may need your cholesterol levels checked more frequently.Ongoing high lipid and cholesterol levels should be treated with medicines if diet and exercise are not effective.   If you smoke, find out from your caregiver how to quit. If you do not use tobacco, do not start.   If you are pregnant, do not drink alcohol. If you are breastfeeding, be very cautious about drinking alcohol. If you are not pregnant and choose to drink alcohol, do not exceed 1 drink per day. One drink is considered to be 12 ounces (355 mL) of beer, 5 ounces (148 mL) of wine, or 1.5 ounces (44 mL) of liquor.   Avoid use of street drugs. Do not share needles with anyone. Ask for help if you need support or instructions about stopping the use of drugs.   High blood pressure causes heart disease and increases the risk of stroke. Your blood pressure should be checked at least every 1 to 2 years. Ongoing high blood pressure should be treated with medicines if weight loss and exercise are not effective.   If you are 55 to 47   years old, ask your caregiver if you should take aspirin to prevent strokes.   Diabetes screening involves taking a blood sample to check your fasting blood sugar level. This should be done once every 3 years, after age 45, if you are within normal weight and without risk factors for diabetes. Testing should be considered at a younger age or be carried out more frequently if you are overweight and have at least 1 risk factor for diabetes.   Breast cancer screening is essential preventive care for women. You should practice "breast  self-awareness." This means understanding the normal appearance and feel of your breasts and may include breast self-examination. Any changes detected, no matter how small, should be reported to a caregiver. Women in their 20s and 30s should have a clinical breast exam (CBE) by a caregiver as part of a regular health exam every 1 to 3 years. After age 40, women should have a CBE every year. Starting at age 40, women should consider having a mammography (breast X-ray test) every year. Women who have a family history of breast cancer should talk to their caregiver about genetic screening. Women at a high risk of breast cancer should talk to their caregivers about having magnetic resonance imaging (MRI) and a mammography every year.   The Pap test is a screening test for cervical cancer. A Pap test can show cell changes on the cervix that might become cervical cancer if left untreated. A Pap test is a procedure in which cells are obtained and examined from the lower end of the uterus (cervix).   Women should have a Pap test starting at age 21.   Between ages 21 and 29, Pap tests should be repeated every 2 years.   Beginning at age 30, you should have a Pap test every 3 years as long as the past 3 Pap tests have been normal.   Some women have medical problems that increase the chance of getting cervical cancer. Talk to your caregiver about these problems. It is especially important to talk to your caregiver if a new problem develops soon after your last Pap test. In these cases, your caregiver may recommend more frequent screening and Pap tests.   The above recommendations are the same for women who have or have not gotten the vaccine for human papillomavirus (HPV).   If you had a hysterectomy for a problem that was not cancer or a condition that could lead to cancer, then you no longer need Pap tests. Even if you no longer need a Pap test, a regular exam is a good idea to make sure no other problems are  starting.   If you are between ages 65 and 70, and you have had normal Pap tests going back 10 years, you no longer need Pap tests. Even if you no longer need a Pap test, a regular exam is a good idea to make sure no other problems are starting.   If you have had past treatment for cervical cancer or a condition that could lead to cancer, you need Pap tests and screening for cancer for at least 20 years after your treatment.   If Pap tests have been discontinued, risk factors (such as a new sexual partner) need to be reassessed to determine if screening should be resumed.   The HPV test is an additional test that may be used for cervical cancer screening. The HPV test looks for the virus that can cause the cell changes on the cervix.   The cells collected during the Pap test can be tested for HPV. The HPV test could be used to screen women aged 30 years and older, and should be used in women of any age who have unclear Pap test results. After the age of 30, women should have HPV testing at the same frequency as a Pap test.   Colorectal cancer can be detected and often prevented. Most routine colorectal cancer screening begins at the age of 50 and continues through age 75. However, your caregiver may recommend screening at an earlier age if you have risk factors for colon cancer. On a yearly basis, your caregiver may provide home test kits to check for hidden blood in the stool. Use of a small camera at the end of a tube, to directly examine the colon (sigmoidoscopy or colonoscopy), can detect the earliest forms of colorectal cancer. Talk to your caregiver about this at age 50, when routine screening begins. Direct examination of the colon should be repeated every 5 to 10 years through age 75, unless early forms of pre-cancerous polyps or small growths are found.   Hepatitis C blood testing is recommended for all people born from 1945 through 1965 and any individual with known risks for hepatitis C.    Practice safe sex. Use condoms and avoid high-risk sexual practices to reduce the spread of sexually transmitted infections (STIs). STIs include gonorrhea, chlamydia, syphilis, trichomonas, herpes, HPV, and human immunodeficiency virus (HIV). Herpes, HIV, and HPV are viral illnesses that have no cure. They can result in disability, cancer, and death. Sexually active women aged 25 and younger should be checked for chlamydia. Older women with new or multiple partners should also be tested for chlamydia. Testing for other STIs is recommended if you are sexually active and at increased risk.   Osteoporosis is a disease in which the bones lose minerals and strength with aging. This can result in serious bone fractures. The risk of osteoporosis can be identified using a bone density scan. Women ages 65 and over and women at risk for fractures or osteoporosis should discuss screening with their caregivers. Ask your caregiver whether you should take a calcium supplement or vitamin D to reduce the rate of osteoporosis.   Menopause can be associated with physical symptoms and risks. Hormone replacement therapy is available to decrease symptoms and risks. You should talk to your caregiver about whether hormone replacement therapy is right for you.   Use sunscreen with sun protection factor (SPF) of 30 or more. Apply sunscreen liberally and repeatedly throughout the day. You should seek shade when your shadow is shorter than you. Protect yourself by wearing long sleeves, pants, a wide-brimmed hat, and sunglasses year round, whenever you are outdoors.   Once a month, do a whole body skin exam, using a mirror to look at the skin on your back. Notify your caregiver of new moles, moles that have irregular borders, moles that are larger than a pencil eraser, or moles that have changed in shape or color.   Stay current with required immunizations.   Influenza. You need a dose every fall (or winter). The composition of  the flu vaccine changes each year, so being vaccinated once is not enough.   Pneumococcal polysaccharide. You need 1 to 2 doses if you smoke cigarettes or if you have certain chronic medical conditions. You need 1 dose at age 65 (or older) if you have never been vaccinated.   Tetanus, diphtheria, pertussis (Tdap, Td). Get 1 dose of   Tdap vaccine if you are younger than age 65, are over 65 and have contact with an infant, are a healthcare worker, are pregnant, or simply want to be protected from whooping cough. After that, you need a Td booster dose every 10 years. Consult your caregiver if you have not had at least 3 tetanus and diphtheria-containing shots sometime in your life or have a deep or dirty wound.   HPV. You need this vaccine if you are a woman age 26 or younger. The vaccine is given in 3 doses over 6 months.   Measles, mumps, rubella (MMR). You need at least 1 dose of MMR if you were born in 1957 or later. You may also need a second dose.   Meningococcal. If you are age 19 to 21 and a first-year college student living in a residence hall, or have one of several medical conditions, you need to get vaccinated against meningococcal disease. You may also need additional booster doses.   Zoster (shingles). If you are age 60 or older, you should get this vaccine.   Varicella (chickenpox). If you have never had chickenpox or you were vaccinated but received only 1 dose, talk to your caregiver to find out if you need this vaccine.   Hepatitis A. You need this vaccine if you have a specific risk factor for hepatitis A virus infection or you simply wish to be protected from this disease. The vaccine is usually given as 2 doses, 6 to 18 months apart.   Hepatitis B. You need this vaccine if you have a specific risk factor for hepatitis B virus infection or you simply wish to be protected from this disease. The vaccine is given in 3 doses, usually over 6 months.  Preventive Services /  Frequency Ages 19 to 39  Blood pressure check.** / Every 1 to 2 years.   Lipid and cholesterol check.** / Every 5 years beginning at age 20.   Clinical breast exam.** / Every 3 years for women in their 20s and 30s.   Pap test.** / Every 2 years from ages 21 through 29. Every 3 years starting at age 30 through age 65 or 70 with a history of 3 consecutive normal Pap tests.   HPV screening.** / Every 3 years from ages 30 through ages 65 to 70 with a history of 3 consecutive normal Pap tests.   Hepatitis C blood test.** / For any individual with known risks for hepatitis C.   Skin self-exam. / Monthly.   Influenza immunization.** / Every year.   Pneumococcal polysaccharide immunization.** / 1 to 2 doses if you smoke cigarettes or if you have certain chronic medical conditions.   Tetanus, diphtheria, pertussis (Tdap, Td) immunization. / A one-time dose of Tdap vaccine. After that, you need a Td booster dose every 10 years.   HPV immunization. / 3 doses over 6 months, if you are 26 and younger.   Measles, mumps, rubella (MMR) immunization. / You need at least 1 dose of MMR if you were born in 1957 or later. You may also need a second dose.   Meningococcal immunization. / 1 dose if you are age 19 to 21 and a first-year college student living in a residence hall, or have one of several medical conditions, you need to get vaccinated against meningococcal disease. You may also need additional booster doses.   Varicella immunization.** / Consult your caregiver.   Hepatitis A immunization.** / Consult your caregiver. 2 doses, 6 to 18 months   apart.   Hepatitis B immunization.** / Consult your caregiver. 3 doses usually over 6 months.  Ages 40 to 64  Blood pressure check.** / Every 1 to 2 years.   Lipid and cholesterol check.** / Every 5 years beginning at age 20.   Clinical breast exam.** / Every year after age 40.   Mammogram.** / Every year beginning at age 40 and continuing for as  long as you are in good health. Consult with your caregiver.   Pap test.** / Every 3 years starting at age 30 through age 65 or 70 with a history of 3 consecutive normal Pap tests.   HPV screening.** / Every 3 years from ages 30 through ages 65 to 70 with a history of 3 consecutive normal Pap tests.   Fecal occult blood test (FOBT) of stool. / Every year beginning at age 50 and continuing until age 75. You may not need to do this test if you get a colonoscopy every 10 years.   Flexible sigmoidoscopy or colonoscopy.** / Every 5 years for a flexible sigmoidoscopy or every 10 years for a colonoscopy beginning at age 50 and continuing until age 75.   Hepatitis C blood test.** / For all people born from 1945 through 1965 and any individual with known risks for hepatitis C.   Skin self-exam. / Monthly.   Influenza immunization.** / Every year.   Pneumococcal polysaccharide immunization.** / 1 to 2 doses if you smoke cigarettes or if you have certain chronic medical conditions.   Tetanus, diphtheria, pertussis (Tdap, Td) immunization.** / A one-time dose of Tdap vaccine. After that, you need a Td booster dose every 10 years.   Measles, mumps, rubella (MMR) immunization. / You need at least 1 dose of MMR if you were born in 1957 or later. You may also need a second dose.   Varicella immunization.** / Consult your caregiver.   Meningococcal immunization.** / Consult your caregiver.   Hepatitis A immunization.** / Consult your caregiver. 2 doses, 6 to 18 months apart.   Hepatitis B immunization.** / Consult your caregiver. 3 doses, usually over 6 months.  Ages 65 and over  Blood pressure check.** / Every 1 to 2 years.   Lipid and cholesterol check.** / Every 5 years beginning at age 20.   Clinical breast exam.** / Every year after age 40.   Mammogram.** / Every year beginning at age 40 and continuing for as long as you are in good health. Consult with your caregiver.   Pap test.** /  Every 3 years starting at age 30 through age 65 or 70 with a 3 consecutive normal Pap tests. Testing can be stopped between 65 and 70 with 3 consecutive normal Pap tests and no abnormal Pap or HPV tests in the past 10 years.   HPV screening.** / Every 3 years from ages 30 through ages 65 or 70 with a history of 3 consecutive normal Pap tests. Testing can be stopped between 65 and 70 with 3 consecutive normal Pap tests and no abnormal Pap or HPV tests in the past 10 years.   Fecal occult blood test (FOBT) of stool. / Every year beginning at age 50 and continuing until age 75. You may not need to do this test if you get a colonoscopy every 10 years.   Flexible sigmoidoscopy or colonoscopy.** / Every 5 years for a flexible sigmoidoscopy or every 10 years for a colonoscopy beginning at age 50 and continuing until age 75.   Hepatitis   C blood test.** / For all people born from 60 through 1965 and any individual with known risks for hepatitis C.   Osteoporosis screening.** / A one-time screening for women ages 59 and over and women at risk for fractures or osteoporosis.   Skin self-exam. / Monthly.   Influenza immunization.** / Every year.   Pneumococcal polysaccharide immunization.** / 1 dose at age 60 (or older) if you have never been vaccinated.   Tetanus, diphtheria, pertussis (Tdap, Td) immunization. / A one-time dose of Tdap vaccine if you are over 65 and have contact with an infant, are a Research scientist (physical sciences), or simply want to be protected from whooping cough. After that, you need a Td booster dose every 10 years.   Varicella immunization.** / Consult your caregiver.   Meningococcal immunization.** / Consult your caregiver.   Hepatitis A immunization.** / Consult your caregiver. 2 doses, 6 to 18 months apart.   Hepatitis B immunization.** / Check with your caregiver. 3 doses, usually over 6 months.  ** Family history and personal history of risk and conditions may change your caregiver's  recommendations. Document Released: 04/21/2001 Document Revised: 02/12/2011 Document Reviewed: 07/21/2010 [facility] Patient Information 2012 [place], [place].Urinary Tract Infection Infections of the urinary tract can start in several places. A bladder infection (cystitis), a kidney infection (pyelonephritis), and a prostate infection (prostatitis) are different types of urinary tract infections (UTIs). They usually get better if treated with medicines (antibiotics) that kill germs. Take all the medicine until it is gone. You or your child may feel better in a few days, but TAKE ALL MEDICINE or the infection may not respond and may become more difficult to treat. HOME CARE INSTRUCTIONS   Drink enough water and fluids to keep the urine clear or pale yellow. Cranberry juice is especially recommended, in addition to large amounts of water.   Avoid caffeine, tea, and carbonated beverages. They tend to irritate the bladder.   Alcohol may irritate the prostate.   Only take over-the-counter or prescription medicines for pain, discomfort, or fever as directed by your caregiver.  To prevent further infections:  Empty the bladder often. Avoid holding urine for long periods of time.   After a bowel movement, women should cleanse from front to back. Use each tissue only once.   Empty the bladder before and after sexual intercourse.  FINDING OUT THE RESULTS OF YOUR TEST Not all test results are available during your visit. If your or your child's test results are not back during the visit, make an appointment with your caregiver to find out the results. Do not assume everything is normal if you have not heard from your caregiver or the medical facility. It is important for you to follow up on all test results. SEEK MEDICAL CARE IF:   There is back pain.   Your baby is older than 3 months with a rectal temperature of 100.5 F (38.1 C) or higher for more than 1 day.   Your or your child's problems  (symptoms) are no better in 3 days. Return sooner if you or your child is getting worse.  SEEK IMMEDIATE MEDICAL CARE IF:   There is severe back pain or lower abdominal pain.   You or your child develops chills.   You have a fever.   Your baby is older than 3 months with a rectal temperature of 102 F (38.9 C) or higher.   Your baby is 73 months old or younger with a rectal temperature of 100.4 F (  38 C) or higher.   There is nausea or vomiting.   There is continued burning or discomfort with urination.  MAKE SURE YOU:   Understand these instructions.   Will watch your condition.   Will get help right away if you are not doing well or get worse.  Document Released: 12/03/2004 Document Revised: 02/12/2011 Document Reviewed: 07/08/2006 [facility] Patient Information 2012 [place], [place].

## 2011-11-04 NOTE — Assessment & Plan Note (Signed)
Repeat CBC today 

## 2011-11-04 NOTE — Assessment & Plan Note (Signed)
Her BP is well controlled, today I will check her labs for secondary causes and end organ damage

## 2011-11-07 LAB — CULTURE, URINE COMPREHENSIVE: Colony Count: 5000

## 2011-11-09 ENCOUNTER — Encounter: Payer: Self-pay | Admitting: Internal Medicine

## 2011-11-24 ENCOUNTER — Other Ambulatory Visit (INDEPENDENT_AMBULATORY_CARE_PROVIDER_SITE_OTHER): Payer: BC Managed Care – PPO

## 2011-11-24 ENCOUNTER — Encounter: Payer: Self-pay | Admitting: Internal Medicine

## 2011-11-24 ENCOUNTER — Ambulatory Visit (INDEPENDENT_AMBULATORY_CARE_PROVIDER_SITE_OTHER): Payer: BC Managed Care – PPO | Admitting: Internal Medicine

## 2011-11-24 VITALS — BP 114/72 | HR 76 | Temp 98.2°F | Resp 16 | Wt 309.8 lb

## 2011-11-24 DIAGNOSIS — N390 Urinary tract infection, site not specified: Secondary | ICD-10-CM

## 2011-11-24 DIAGNOSIS — I10 Essential (primary) hypertension: Secondary | ICD-10-CM

## 2011-11-24 DIAGNOSIS — D72829 Elevated white blood cell count, unspecified: Secondary | ICD-10-CM

## 2011-11-24 DIAGNOSIS — Z23 Encounter for immunization: Secondary | ICD-10-CM

## 2011-11-24 LAB — CBC WITH DIFFERENTIAL/PLATELET
Eosinophils Relative: 2.7 % (ref 0.0–5.0)
Lymphocytes Relative: 27.5 % (ref 12.0–46.0)
Monocytes Relative: 6.5 % (ref 3.0–12.0)
Neutrophils Relative %: 62.6 % (ref 43.0–77.0)
Platelets: 266 10*3/uL (ref 150.0–400.0)
RBC: 4.5 Mil/uL (ref 3.87–5.11)
WBC: 10 10*3/uL (ref 4.5–10.5)

## 2011-11-24 LAB — URINALYSIS, ROUTINE W REFLEX MICROSCOPIC
Ketones, ur: NEGATIVE
Leukocytes, UA: NEGATIVE
Nitrite: NEGATIVE
Specific Gravity, Urine: 1.025 (ref 1.000–1.030)
Urobilinogen, UA: 0.2 (ref 0.0–1.0)

## 2011-11-24 LAB — SEDIMENTATION RATE: Sed Rate: 0 mm/hr (ref 0–22)

## 2011-11-24 LAB — C-REACTIVE PROTEIN: CRP: 0.7 mg/dL (ref 0.5–20.0)

## 2011-11-24 NOTE — Patient Instructions (Signed)

## 2011-11-24 NOTE — Progress Notes (Signed)
Subjective:    Patient ID: [name], female    DOB: 1965/06/08, 46 y.o.   MRN: [id number]  Urinary Tract Infection  This is a chronic problem. The current episode started 1 to 4 weeks ago. The problem has been resolved. The patient is experiencing no pain. There has been no fever. The fever has been present for less than 1 day. She is not sexually active. There is no history of pyelonephritis. Pertinent negatives include no chills, discharge, flank pain, frequency, hematuria, hesitancy, nausea, possible pregnancy, sweats, urgency or vomiting. She has tried antibiotics for the symptoms. The treatment provided significant relief.      Review of Systems  Constitutional: Negative for fever, chills, diaphoresis, activity change, appetite change, fatigue and unexpected weight change.  HENT: Negative.   Eyes: Negative.   Respiratory: Negative for chest tightness, shortness of breath, wheezing and stridor.   Cardiovascular: Negative for chest pain, palpitations and leg swelling.  Gastrointestinal: Negative for nausea, vomiting, abdominal pain, diarrhea, constipation and blood in stool.  Genitourinary: Negative for dysuria, hesitancy, urgency, frequency, hematuria, flank pain and pelvic pain.  Musculoskeletal: Negative for myalgias, back pain, joint swelling, arthralgias and gait problem.  Skin: Negative.   Neurological: Negative.   Hematological: Negative for adenopathy. Does not bruise/bleed easily.  Psychiatric/Behavioral: Negative.        Objective:   Physical Exam  Vitals reviewed. Constitutional: She is oriented to person, place, and time. She appears well-developed and well-nourished. No distress.  HENT:  Head: Normocephalic and atraumatic.  Mouth/Throat: Oropharynx is clear and moist. No oropharyngeal exudate.  Eyes: Conjunctivae normal are normal. Right eye exhibits no discharge. Left eye exhibits no discharge. No scleral icterus.  Neck: Normal range of motion. Neck supple.  No JVD present. No tracheal deviation present. No thyromegaly present.  Cardiovascular: Normal rate, regular rhythm, normal heart sounds and intact distal pulses.  Exam reveals no gallop and no friction rub.   No murmur heard. Pulmonary/Chest: Effort normal and breath sounds normal. No stridor. No respiratory distress. She has no wheezes. She has no rales. She exhibits no tenderness.  Abdominal: Soft. Bowel sounds are normal. She exhibits no distension and no mass. There is no hepatosplenomegaly. There is no tenderness. There is no rebound, no guarding and no CVA tenderness.  Musculoskeletal: Normal range of motion. She exhibits no edema and no tenderness.  Lymphadenopathy:    She has no cervical adenopathy.  Neurological: She is oriented to person, place, and time.  Skin: Skin is warm and dry. No rash noted. She is not diaphoretic. No erythema. No pallor.  Psychiatric: She has a normal mood and affect. Her behavior is normal. Judgment and thought content normal.     Lab Results  Component Value Date   WBC 13.7* 11/04/2011   HGB 12.4 11/04/2011   HCT 38.7 11/04/2011   PLT 275.0 11/04/2011   GLUCOSE 77 11/04/2011   CHOL 188 11/04/2011   TRIG 89.0 11/04/2011   HDL 51.40 11/04/2011   LDLCALC 119* 11/04/2011   ALT 13 11/04/2011   AST 16 11/04/2011   NA 139 11/04/2011   K 4.1 11/04/2011   CL 103 11/04/2011   CREATININE 0.7 11/04/2011   BUN 12 11/04/2011   CO2 27 11/04/2011   TSH 1.19 11/04/2011   INR 1.0 07/06/2007   HGBA1C  Value: 5.7 (NOTE)   The ADA recommends the following therapeutic goals for glycemic   control related to Hgb A1C measurement:   Goal of Therapy:   < 7.0%  Hgb A1C   Action Suggested:  > 8.0% Hgb A1C   Ref:  Diabetes Care, 22, Suppl. 1, 1999 07/06/2007       Assessment & Plan:

## 2011-11-25 ENCOUNTER — Telehealth: Payer: Self-pay | Admitting: Internal Medicine

## 2011-11-25 NOTE — Telephone Encounter (Signed)
Caller: Mariem/Patient; Patient Name: [name]; PCP: Sanda Linger (Adults only); Best Callback Phone Number: [phone]; Call regarding: Boil; noticed it 11/24/11, but unsure how long it has been to the area below her abdomen; seems to have healed over and is scaly now; denies tenderness, itching, warmth, or red streaking; afebrile; says she was in the office 9/17 and had elevated WBCs; Dr.Jones asked bout any unhealed areas on her body and didn't think she had any; All emergent symptoms of Skin Lesions protocol ruled out; home care advice given; instructed to watch for fever, tenderness, warmth, or increased redness

## 2011-11-26 LAB — PROTEIN ELECTROPHORESIS, SERUM
Gamma Globulin: 17.7 % (ref 11.1–18.8)
Total Protein, Serum Electrophoresis: 7.4 g/dL (ref 6.0–8.3)

## 2011-11-27 LAB — CULTURE, URINE COMPREHENSIVE: Colony Count: 2000

## 2011-11-30 NOTE — Assessment & Plan Note (Signed)
I will recheck her CBC and SPEP as well as CRP to look for chronic inflammation, infection, lymphoproliferative disease

## 2011-11-30 NOTE — Assessment & Plan Note (Signed)
She has no s/s, recheck her UA and urine clx

## 2011-11-30 NOTE — Assessment & Plan Note (Signed)
Her BP is well controlled 

## 2011-12-18 ENCOUNTER — Other Ambulatory Visit: Payer: Self-pay | Admitting: Nurse Practitioner

## 2011-12-18 MED ORDER — METOPROLOL SUCCINATE ER 50 MG PO TB24: 50.0000 mg | ORAL_TABLET | Freq: Every day | ORAL | Status: DC

## 2011-12-18 NOTE — Telephone Encounter (Signed)
[facility] on [address] [phone]

## 2012-01-07 ENCOUNTER — Ambulatory Visit: Payer: BC Managed Care – PPO | Admitting: Internal Medicine

## 2012-01-07 ENCOUNTER — Encounter (HOSPITAL_COMMUNITY): Payer: Self-pay | Admitting: Neurology

## 2012-01-07 ENCOUNTER — Emergency Department (HOSPITAL_COMMUNITY): Payer: BC Managed Care – PPO

## 2012-01-07 ENCOUNTER — Telehealth: Payer: Self-pay | Admitting: Internal Medicine

## 2012-01-07 ENCOUNTER — Emergency Department (HOSPITAL_COMMUNITY)
Admission: EM | Admit: 2012-01-07 | Discharge: 2012-01-07 | Disposition: A | Discharge status: Home / Self Care | Payer: BC Managed Care – PPO | Attending: Emergency Medicine | Admitting: Emergency Medicine

## 2012-01-07 DIAGNOSIS — Z0289 Encounter for other administrative examinations: Secondary | ICD-10-CM

## 2012-01-07 DIAGNOSIS — R071 Chest pain on breathing: Secondary | ICD-10-CM | POA: Insufficient documentation

## 2012-01-07 DIAGNOSIS — G4730 Sleep apnea, unspecified: Secondary | ICD-10-CM | POA: Insufficient documentation

## 2012-01-07 DIAGNOSIS — I10 Essential (primary) hypertension: Secondary | ICD-10-CM | POA: Insufficient documentation

## 2012-01-07 DIAGNOSIS — K219 Gastro-esophageal reflux disease without esophagitis: Secondary | ICD-10-CM | POA: Insufficient documentation

## 2012-01-07 DIAGNOSIS — R0789 Other chest pain: Secondary | ICD-10-CM

## 2012-01-07 DIAGNOSIS — Z79899 Other long term (current) drug therapy: Secondary | ICD-10-CM | POA: Insufficient documentation

## 2012-01-07 DIAGNOSIS — K449 Diaphragmatic hernia without obstruction or gangrene: Secondary | ICD-10-CM | POA: Insufficient documentation

## 2012-01-07 LAB — COMPREHENSIVE METABOLIC PANEL
Alkaline Phosphatase: 92 U/L (ref 39–117)
BUN: 13 mg/dL (ref 6–23)
Chloride: 103 mEq/L (ref 96–112)
GFR calc Af Amer: >90 mL/min (ref >90)
Glucose, Bld: 97 mg/dL (ref 70–99)
Potassium: 4.4 mEq/L (ref 3.5–5.1)
Total Bilirubin: 0.3 mg/dL (ref 0.3–1.2)

## 2012-01-07 LAB — CBC WITH DIFFERENTIAL/PLATELET
Eosinophils Absolute: 0.4 10*3/uL (ref 0.0–0.7)
Hemoglobin: 12.4 g/dL (ref 12.0–15.0)
Lymphs Abs: 4.5 10*3/uL — ABNORMAL HIGH (ref 0.7–4.0)
MCH: 28.1 pg (ref 26.0–34.0)
Monocytes Relative: 8 % (ref 3–12)
Neutrophils Relative %: 51 % (ref 43–77)
RBC: 4.41 MIL/uL (ref 3.87–5.11)

## 2012-01-07 LAB — TROPONIN I: Troponin I: <0.3 ng/mL (ref <0.30)

## 2012-01-07 MED ORDER — KETOROLAC TROMETHAMINE 30 MG/ML IJ SOLN
30.0000 mg | Freq: Once | INTRAMUSCULAR | Status: AC
  Administered 2012-01-07: 30 mg via INTRAVENOUS
  Filled 2012-01-07: qty 1

## 2012-01-07 MED ORDER — IBUPROFEN 800 MG PO TABS: 800.0000 mg | ORAL_TABLET | Freq: Three times a day (TID) | ORAL | Status: DC

## 2012-01-07 NOTE — ED Notes (Signed)
Phlebotomy at bedside.

## 2012-01-07 NOTE — ED Notes (Signed)
PER EMS- Pt reporting CP since yesterday right sided, "feels like pulled muscle", radiation to back, worse with deep breath. Went to work last night, pain got worse. Currently cp 8/10. Took 324 aspirin PTA, EMS gave 1 nitro pain decreased from 9/10 to 8/10. Denying any sob, n/v. Skin warm and dry. EKG SR BP 135/85. Pt a x 4.

## 2012-01-07 NOTE — ED Notes (Signed)
Tina, NT and myself got pt undressed, in gown, on monitor, continuous pulse oximetry and blood pressure cuff; Inetta Fermo, NT performing EKG

## 2012-01-07 NOTE — Telephone Encounter (Signed)
Caller: [name]/Patient; Patient Name: [name]; PCP: Sanda Linger (Adults only); Best Callback Phone Number: [phone], chest pain onset 01/06/12, has gotten worse over the night, radiates toward back, currently by herself Chest Pain Protocol Utilized.   RN advised patient to call EMS 911. Pt voiced understanding.  RN to call patient back in 5 min.  [id number] RN called patient back.  Pt says that she called EMS and on the way.

## 2012-01-07 NOTE — ED Provider Notes (Signed)
History     CSN: [id number]  Arrival date & time 01/07/12  3086   First MD Initiated Contact with Patient 01/07/12 1009      Chief Complaint  Patient presents with  . Chest Pain    (Consider location/radiation/quality/duration/timing/severity/associated sxs/prior treatment) HPI Comments: Patient arrives by EMS with right-sided chest pain has been going on since yesterday morning. The pain has been constant and variable in intensity never gone away completely. It radiates to her upper back it is worse with deep breath movement of her arm. 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 Denies shortness of breath, nausea, vomiting, diaphoresis. Denies any leg pain or swelling. No cough, congestion, fever chills. She is not on birth control. She reports a negative stress echo in 2009.  The history is provided by the patient and the EMS personnel.    Past Medical History  Diagnosis Date  . Hypertension   . Sleep apnea     does not wear CPAP  . GERD (gastroesophageal reflux disease)   . Arthritis   . Morbid obesity   . Hiatal hernia     Past Surgical History  Procedure Date  . Abdominal hysterectomy   . Cardiovascular stress test 07/25/2007    Negative stress echo; ef 55-60%  . Knee arthroscopy 01/2011    left    Family History  Problem Relation Age of Onset  . Cervical cancer Mother   . Hypertension Sister   . Diabetes Sister   . Heart disease Neg Hx   . Cancer Neg Hx   . Alcohol abuse Neg Hx   . Early death Neg Hx   . Hearing loss Neg Hx   . Hyperlipidemia Neg Hx   . Kidney disease Neg Hx   . Stroke Neg Hx     History  Substance Use Topics  . Smoking status: Never Smoker   . Smokeless tobacco: Never Used  . Alcohol Use: No    OB History    Grav Para Term Preterm Abortions TAB SAB Ect Mult Living                  Review of Systems  Constitutional: Negative for fever, activity change and appetite change.  HENT: Negative  for congestion and rhinorrhea.   Respiratory: Positive for chest tightness. Negative for cough and shortness of breath.   Cardiovascular: Positive for chest pain.  Gastrointestinal: Negative for nausea, vomiting and abdominal pain.  Genitourinary: Negative for dysuria, hematuria, vaginal bleeding and vaginal discharge.  Musculoskeletal: Negative for back pain.  Skin: Negative for rash.  Neurological: Negative for dizziness and headaches.    Allergies  Oxycodone  Home Medications   Current Outpatient Rx  Name Route Sig Dispense Refill  . ACETAMINOPHEN 500 MG PO TABS Oral Take 1,000 mg by mouth every 6 (six) hours as needed. For pain    . CALCIUM CARBONATE-VITAMIN D 600-400 MG-UNIT PO CHEW Oral Chew 2 tablets by mouth daily.    Marland Kitchen METOPROLOL SUCCINATE ER 50 MG PO TB24 Oral Take 1 tablet (50 mg total) by mouth daily. 30 tablet 5  . PANTOPRAZOLE SODIUM 40 MG PO TBEC Oral Take 1 tablet (40 mg total) by mouth daily. 30 tablet 11  . IBUPROFEN 800 MG PO TABS Oral Take 1 tablet (800 mg total) by mouth 3 (three) times daily. 21 tablet 0    BP 110/56  Pulse 61  Temp 98.6 F (37 C)  Resp 16  SpO2 100%  Physical Exam  Constitutional: She is oriented to person, place, and time. She appears well-developed and well-nourished. No distress.  HENT:  Head: Normocephalic and atraumatic.  Mouth/Throat: Oropharynx is clear and moist. No oropharyngeal exudate.  Eyes: Conjunctivae normal and EOM are normal. Pupils are equal, round, and reactive to light.  Neck: Normal range of motion. Neck supple.  Cardiovascular: Normal rate, regular rhythm and normal heart sounds.   No murmur heard. Pulmonary/Chest: Effort normal and breath sounds normal. No respiratory distress. She exhibits tenderness.       TTP R sided chest wall. No rash  Abdominal: Soft. There is no tenderness. There is no rebound and no guarding.  Musculoskeletal: Normal range of motion. She exhibits no edema and no tenderness.       TTP R  upper back Worse with arm movement  Neurological: She is alert and oriented to person, place, and time. No cranial nerve deficit.  Skin: Skin is warm.    ED Course  Procedures (including critical care time)  Labs Reviewed  CBC WITH DIFFERENTIAL - Abnormal; Notable for the following:    WBC 11.8 (*)     Lymphs Abs 4.5 (*)     All other components within normal limits  COMPREHENSIVE METABOLIC PANEL - Abnormal; Notable for the following:    Albumin 3.4 (*)     All other components within normal limits  TROPONIN I  D-DIMER, QUANTITATIVE   Dg Chest 2 View  01/07/2012  *RADIOLOGY REPORT*  Clinical Data: Chest pain.  CHEST - 2 VIEW  Comparison: 03/10/2011.  Findings: The heart, mediastinal and hilar contours are normal. Linear scarring is again identified at the left lung base.  There are no effusions or pneumothoraces.  The lungs remain clear.  There are no acute bony abnormalities.  IMPRESSION: No active disease.   Original Report Authenticated By: Sander Radon, M.D.      1. Chest wall pain       MDM  Constant right-sided chest pain since yesterday it is reproducible. No associated symptoms. No shortness of breath. Vital stable, EKG nonischemic  Chest Xray negative. D-dimer and troponin negative. Reproducible pain improved with meds. Atypical for ACS.   Date: 01/07/2012  Rate: 61  Rhythm: normal sinus rhythm  QRS Axis: normal  Intervals: normal  ST/T Wave abnormalities: normal  Conduction Disutrbances:none  Narrative Interpretation:   Old EKG Reviewed: unchanged        Glynn Octave, MD 01/07/12 1143

## 2012-02-02 ENCOUNTER — Encounter: Payer: Self-pay | Admitting: Gastroenterology

## 2012-02-02 ENCOUNTER — Ambulatory Visit (INDEPENDENT_AMBULATORY_CARE_PROVIDER_SITE_OTHER): Payer: BC Managed Care – PPO | Admitting: Gastroenterology

## 2012-02-02 VITALS — BP 134/82 | HR 89 | Ht 64.25 in | Wt 306.0 lb

## 2012-02-02 DIAGNOSIS — R110 Nausea: Secondary | ICD-10-CM

## 2012-02-02 DIAGNOSIS — Z8719 Personal history of other diseases of the digestive system: Secondary | ICD-10-CM

## 2012-02-02 DIAGNOSIS — R1013 Epigastric pain: Secondary | ICD-10-CM

## 2012-02-02 MED ORDER — PANTOPRAZOLE SODIUM 40 MG PO TBEC: 40.0000 mg | DELAYED_RELEASE_TABLET | Freq: Two times a day (BID) | ORAL | Status: DC

## 2012-02-02 NOTE — Progress Notes (Signed)
This is a morbidly obese 46 year old African American female it complains of nausea and early satiety.  She was seen previously for chest pain evaluation in June of this year, and had negative endoscopy.  She has been on Protonix 40 mg a day with mild improvement in her reflux symptoms, and denies dysphagia to acid reflux symptoms.  She suffers from morbid obesity, but denies a known history of liver or gallbladder disease.  She denies a lower gastrointestinal problems at this time.  Patient also denies abuse of alcohol, cigarettes, or NSAIDs.  Despite these complaints she's had no anorexia or weight loss.  Last ultrasound the abdomen was in 2009.  Lab review shows no abnormalities.  Biopsies of her esophagus in June were negative.  On chart review, she has had previous colonoscopy with Dr. Loreta Ave.  Family history is negative.   Current Medications, Allergies, Past Medical History, Past Surgical History, Family History and Social History were reviewed in [employer] record.  Pertinent Review of Systems Negative   Physical Exam: Healthy-appearing but obese patient in no distress.  Blood pressure 134/82, pulse 89 and regular, weight 306 pounds, and BMI 52.13.  Resting oxygen saturation 97%.  She is a morbidly obese patient without stigmata of chronic liver disease.  Abdominal exam is difficult because of her marked obesity, but I cannot appreciate any abdominal masses, tenderness, or definite organomegaly.  Bowel sounds are normal and there is no succussion splash noted.  Mental status is normal.   Assessment and Plan: Nausea possibly related acid reflux, rule out cholelithiasis.  I've increased her PPI medication at twice a day 30 minutes before meals, have scheduled upper abdominal ultrasound exam, and also schedule esophageal manometry.  She's been told to avoid NSAIDs and otherwise follow a strict antireflux regime.  Consideration should be given to bariatric surgery in this  46 year old morbidly obese patient. Encounter Diagnoses  Name Primary?  . Epigastric pain Yes  . Nausea

## 2012-02-02 NOTE — Patient Instructions (Addendum)
You have been scheduled for an esophageal manometry at [facility] on 03-07-2012 at 9 AM. Please arrive 30 minutes prior to your procedure for registration. You will need to go to outpatient registration (1st floor of the hospital) first. Make certain to bring your insurance cards as well as a complete list of medications.  Please remember the following:  1) Nothing to eat or drink after 12:00 midnight on the night before your test.  2) Hold all diabetic medications/insulin the morning of the test. You may eat and take your medications after the test.  3) For 3 days prior to your test do not take: Dexilant, Prevacid, Nexium, Protonix, Aciphex, Zegerid, Pantoprazole, Prilosec or omeprazole.  4) For 2 days prior to your test, do not take: Reglan, Tagamet, Zantac, Axid or Pepcid.  5) You MAY use an antacid such as Rolaids or Tums up to 12 hours prior to your test.  It will take at least 2 weeks to receive the results of this test from your physician. ------------------------------------------ ABOUT ESOPHAGEAL MANOMETRY Esophageal manometry (muh-NOM-uh-tree) is a test that gauges how well your esophagus works. Your esophagus is the long, muscular tube that connects your throat to your stomach. Esophageal manometry measures the rhythmic muscle contractions (peristalsis) that occur in your esophagus when you swallow. Esophageal manometry also measures the coordination and force exerted by the muscles of your esophagus.  During esophageal manometry, a thin, flexible tube (catheter) that contains sensors is passed through your nose, down your esophagus and into your stomach. Esophageal manometry can be helpful in diagnosing some mostly uncommon disorders that affect your esophagus.  Why it's done Esophageal manometry is used to evaluate the movement (motility) of food through the esophagus and into the stomach. The test measures how well the circular bands of muscle (sphincters) at the top and  bottom of your esophagus open and close, as well as the pressure, strength and pattern of the wave of esophageal muscle contractions that moves food along.  What you can expect Esophageal manometry is an outpatient procedure done without sedation. Most people tolerate it well. You may be asked to change into a hospital gown before the test starts.  During esophageal manometry  While you are sitting up, a member of your health care team sprays your throat with a numbing medication or puts numbing gel in your nose or both.  A catheter is guided through your nose into your esophagus. The catheter may be sheathed in a water-filled sleeve. It doesn't interfere with your breathing. However, your eyes may water, and you may gag. You may have a slight nosebleed from irritation.  After the catheter is in place, you may be asked to lie on your back on an exam table, or you may be asked to remain seated.  You then swallow small sips of water. As you do, a computer connected to the catheter records the pressure, strength and pattern of your esophageal muscle contractions.  During the test, you'll be asked to breathe slowly and smoothly, remain as still as possible, and swallow only when you're asked to do so.  A member of your health care team may move the catheter down into your stomach while the catheter continues its measurements.  The catheter then is slowly withdrawn. The test usually lasts 20 to 30 minutes.  After esophageal manometry  When your esophageal manometry is complete, you may return to your normal activities  This test typically takes 30-45 minutes to complete. _____________________________________________________________________________________________________  You have  been scheduled for an abdominal ultrasound at [facility] (1st floor of hospital) on 02-03-2012 at 11:30 AM. Please arrive 15 minutes prior to your appointment for registration. Make certain not to have anything to  eat or drink 6 hours prior to your appointment. Should you need to reschedule your appointment, please contact radiology at [phone]. This test typically takes about 30 minutes to perform.  Please increase Protonix to twice daily.  Information about GERD was given today.

## 2012-02-03 ENCOUNTER — Ambulatory Visit (HOSPITAL_COMMUNITY)
Admission: RE | Admit: 2012-02-03 | Discharge: 2012-02-03 | Disposition: A | Discharge status: Home / Self Care | Payer: BC Managed Care – PPO | Source: Ambulatory Visit | Attending: Gastroenterology | Admitting: Gastroenterology

## 2012-02-03 DIAGNOSIS — R1013 Epigastric pain: Secondary | ICD-10-CM | POA: Insufficient documentation

## 2012-02-03 DIAGNOSIS — R110 Nausea: Secondary | ICD-10-CM | POA: Insufficient documentation

## 2012-03-07 ENCOUNTER — Encounter (HOSPITAL_COMMUNITY)
Admission: RE | Disposition: A | Discharge status: Home / Self Care | Payer: Self-pay | Source: Ambulatory Visit | Attending: Gastroenterology

## 2012-03-07 ENCOUNTER — Ambulatory Visit (HOSPITAL_COMMUNITY)
Admission: RE | Admit: 2012-03-07 | Discharge: 2012-03-07 | Disposition: A | Discharge status: Home / Self Care | Payer: BC Managed Care – PPO | Source: Ambulatory Visit | Attending: Gastroenterology | Admitting: Gastroenterology

## 2012-03-07 DIAGNOSIS — R1310 Dysphagia, unspecified: Secondary | ICD-10-CM | POA: Insufficient documentation

## 2012-03-07 DIAGNOSIS — R1013 Epigastric pain: Secondary | ICD-10-CM

## 2012-03-07 DIAGNOSIS — K220 Achalasia of cardia: Secondary | ICD-10-CM | POA: Insufficient documentation

## 2012-03-07 HISTORY — PX: ESOPHAGEAL MANOMETRY: SHX5429

## 2012-03-07 SURGERY — MANOMETRY, ESOPHAGUS

## 2012-03-07 MED ORDER — LIDOCAINE VISCOUS 2 % MT SOLN
OROMUCOSAL | Status: AC
  Filled 2012-03-07: qty 15

## 2012-03-08 ENCOUNTER — Encounter (HOSPITAL_COMMUNITY): Payer: Self-pay | Admitting: Gastroenterology

## 2012-03-10 ENCOUNTER — Other Ambulatory Visit: Payer: Self-pay | Admitting: *Deleted

## 2012-03-10 ENCOUNTER — Telehealth: Payer: Self-pay | Admitting: *Deleted

## 2012-03-10 DIAGNOSIS — R6881 Early satiety: Secondary | ICD-10-CM

## 2012-03-10 DIAGNOSIS — R110 Nausea: Secondary | ICD-10-CM

## 2012-03-10 DIAGNOSIS — R109 Unspecified abdominal pain: Secondary | ICD-10-CM

## 2012-03-10 DIAGNOSIS — T17308A Unspecified foreign body in larynx causing other injury, initial encounter: Secondary | ICD-10-CM

## 2012-03-10 NOTE — Telephone Encounter (Signed)
Informed pt of appt for BS for 03/14/12; pt stated understanding.

## 2012-03-10 NOTE — Telephone Encounter (Signed)
Per Dr Jarold Motto, order Barium Swallow to r/o achalasia.

## 2012-03-11 NOTE — Telephone Encounter (Signed)
Received results of EM per Dr Jarold Motto. Hypertensive LES with high residual pressure and aperistalsis with normal amplitude contractions....probable achalasia.  Impresions: achalasia with normal amplitude.. Barium swallow ordered for 03/14/12.

## 2012-03-14 ENCOUNTER — Ambulatory Visit (HOSPITAL_COMMUNITY)
Admission: RE | Admit: 2012-03-14 | Discharge: 2012-03-14 | Disposition: A | Discharge status: Home / Self Care | Payer: BC Managed Care – PPO | Source: Ambulatory Visit | Attending: Gastroenterology | Admitting: Gastroenterology

## 2012-03-14 DIAGNOSIS — R110 Nausea: Secondary | ICD-10-CM | POA: Insufficient documentation

## 2012-03-14 DIAGNOSIS — T17308A Unspecified foreign body in larynx causing other injury, initial encounter: Secondary | ICD-10-CM

## 2012-03-14 DIAGNOSIS — K225 Diverticulum of esophagus, acquired: Secondary | ICD-10-CM | POA: Insufficient documentation

## 2012-03-14 DIAGNOSIS — K219 Gastro-esophageal reflux disease without esophagitis: Secondary | ICD-10-CM | POA: Insufficient documentation

## 2012-03-14 DIAGNOSIS — R109 Unspecified abdominal pain: Secondary | ICD-10-CM | POA: Insufficient documentation

## 2012-03-14 DIAGNOSIS — R6881 Early satiety: Secondary | ICD-10-CM | POA: Insufficient documentation

## 2012-03-15 ENCOUNTER — Telehealth: Payer: Self-pay | Admitting: *Deleted

## 2012-03-15 NOTE — Telephone Encounter (Signed)
Informed pt Dr Jarold Motto would like to see her and discuss all her procedures and S&S; he may want to refer her to [facility]. Pt will see [place] on 03/18/12.

## 2012-03-15 NOTE — Telephone Encounter (Signed)
Message copied by Florene Glen on Tue Mar 15, 2012  9:26 AM ------      Message from: Jarold Motto, DAVID R      Created: Mon Mar 14, 2012  4:15 PM       Please see me .Marland KitchenMarland KitchenMarland Kitchen

## 2012-03-18 ENCOUNTER — Encounter: Payer: Self-pay | Admitting: Gastroenterology

## 2012-03-18 ENCOUNTER — Ambulatory Visit (INDEPENDENT_AMBULATORY_CARE_PROVIDER_SITE_OTHER): Payer: BC Managed Care – PPO | Admitting: Gastroenterology

## 2012-03-18 VITALS — BP 114/74 | HR 66 | Ht 64.25 in | Wt 305.0 lb

## 2012-03-18 DIAGNOSIS — K219 Gastro-esophageal reflux disease without esophagitis: Secondary | ICD-10-CM

## 2012-03-18 DIAGNOSIS — R110 Nausea: Secondary | ICD-10-CM

## 2012-03-18 DIAGNOSIS — R0789 Other chest pain: Secondary | ICD-10-CM

## 2012-03-18 MED ORDER — ONDANSETRON HCL 4 MG PO TABS: 4.0000 mg | ORAL_TABLET | Freq: Two times a day (BID) | ORAL | Status: DC | PRN

## 2012-03-18 NOTE — Progress Notes (Signed)
This is a very nice 47 year old African American female with continued chest pain and negative to date cardiac and GI evaluations.  She's been on Protonix 40 mg twice a day and continues with some continued chest pressure but no real regurgitation, or dysphagia.  She does have rather persistent nausea, but has had no anorexia, weight loss or specific hepatobiliary complaints.  Workup has included normal endoscopy with esophageal biopsies, negative CLO biopsy, normal esophageal manometry and a barium swallow which also was reviewed and is normal.  The patient denies lower gastrointestinal complaints, also denies melena or hematochezia.  CBC and metabolic profile including liver function tests have been normal.  She is followed by Dr.Nasher in cardiology and apparently has had a negative cardiac evaluation.  Current Medications, Allergies, Past Medical History, Past Surgical History, Family History and Social History were reviewed in [employer] record.  Pertinent Review of Systems Negative   Physical Exam: Healthy-appearing patient in no distress.  Blood pressure 114/74, pulse 66 and regular, and weight 305 pounds with a BMI of 51.95.  I cannot appreciate stigmata of chronic liver disease, chest is clear to percussion endoscopy patient, she is in a regular rhythm without murmurs gallops or rubs, and she has been obese abdomen without definite organomegaly, masses, tenderness, or succussion splash.  Mental status is normal.    Assessment and Plan: This morbidly obese patient continues with nonspecific GI complaints despite a negative workup and name.  PPI therapy.  I have scheduled gastric emptying scan to complete her workup and will continue her current medications with when necessary Zofran 4 mg every 12 hours as needed.  It may be that her chest pain is related in large part to her obesity with chest wall discomfort.  She's probably a poor candidate for any type of obesity  bariatric surgery.  Cc Dr. Sanda Linger and Dr. Laqueta Carina Encounter Diagnoses  Name Primary?  . Chest pain Yes  . Nausea

## 2012-03-18 NOTE — Patient Instructions (Addendum)
You have been scheduled for a gastric emptying scan on 03/25/12 at 8 am. Please arrive at least 15 minutes prior to your appointment for registration. Please make certain not to have anything to eat or drink after midnight the night before your test. Hold all stomach medications (ex: Zofran, phenergan, Reglan and Protonix) 48 hours prior to your test. If you need to reschedule your appointment, please contact radiology scheduling at [phone]. _____________________________________________________________________ A gastric-emptying study measures how long it takes for food to move through your stomach. There are several ways to measure stomach emptying. In the most common test, you eat food that contains a small amount of radioactive material. A scanner that detects the movement of the radioactive material is placed over your abdomen to monitor the rate at which food leaves your stomach. This test normally takes about 2 hours to complete. _____________________________________________________________________ We have sent the following medications to your pharmacy for you to pick up at your convenience: Zofran Continue to take Protonix twice daily. CC:  Sanda Linger MD

## 2012-03-25 ENCOUNTER — Encounter (HOSPITAL_COMMUNITY): Payer: Self-pay

## 2012-03-25 ENCOUNTER — Ambulatory Visit (HOSPITAL_COMMUNITY)
Admission: RE | Admit: 2012-03-25 | Discharge: 2012-03-25 | Disposition: A | Discharge status: Home / Self Care | Payer: BC Managed Care – PPO | Source: Ambulatory Visit | Attending: Gastroenterology | Admitting: Gastroenterology

## 2012-03-25 DIAGNOSIS — K219 Gastro-esophageal reflux disease without esophagitis: Secondary | ICD-10-CM | POA: Insufficient documentation

## 2012-03-25 DIAGNOSIS — R109 Unspecified abdominal pain: Secondary | ICD-10-CM | POA: Insufficient documentation

## 2012-03-25 DIAGNOSIS — R079 Chest pain, unspecified: Secondary | ICD-10-CM | POA: Insufficient documentation

## 2012-03-25 DIAGNOSIS — K449 Diaphragmatic hernia without obstruction or gangrene: Secondary | ICD-10-CM | POA: Insufficient documentation

## 2012-03-25 DIAGNOSIS — R110 Nausea: Secondary | ICD-10-CM | POA: Insufficient documentation

## 2012-03-25 MED ORDER — TECHNETIUM TC 99M SULFUR COLLOID
2.0000 | Freq: Once | INTRAVENOUS | Status: AC | PRN
  Administered 2012-03-25: 2 via ORAL

## 2012-03-31 ENCOUNTER — Encounter (HOSPITAL_COMMUNITY): Payer: Self-pay | Admitting: Emergency Medicine

## 2012-03-31 ENCOUNTER — Emergency Department (HOSPITAL_COMMUNITY): Payer: BC Managed Care – PPO

## 2012-03-31 ENCOUNTER — Emergency Department (HOSPITAL_COMMUNITY)
Admission: EM | Admit: 2012-03-31 | Discharge: 2012-03-31 | Disposition: A | Discharge status: Home / Self Care | Payer: BC Managed Care – PPO | Attending: Emergency Medicine | Admitting: Emergency Medicine

## 2012-03-31 DIAGNOSIS — R079 Chest pain, unspecified: Secondary | ICD-10-CM | POA: Insufficient documentation

## 2012-03-31 DIAGNOSIS — R002 Palpitations: Secondary | ICD-10-CM | POA: Insufficient documentation

## 2012-03-31 DIAGNOSIS — Z79899 Other long term (current) drug therapy: Secondary | ICD-10-CM | POA: Insufficient documentation

## 2012-03-31 DIAGNOSIS — Z8739 Personal history of other diseases of the musculoskeletal system and connective tissue: Secondary | ICD-10-CM | POA: Insufficient documentation

## 2012-03-31 DIAGNOSIS — I10 Essential (primary) hypertension: Secondary | ICD-10-CM | POA: Insufficient documentation

## 2012-03-31 DIAGNOSIS — K219 Gastro-esophageal reflux disease without esophagitis: Secondary | ICD-10-CM | POA: Insufficient documentation

## 2012-03-31 DIAGNOSIS — G4730 Sleep apnea, unspecified: Secondary | ICD-10-CM | POA: Insufficient documentation

## 2012-03-31 LAB — CBC
HCT: 35.6 % — ABNORMAL LOW (ref 36.0–46.0)
MCHC: 32.3 g/dL (ref 30.0–36.0)
Platelets: 267 10*3/uL (ref 150–400)
RDW: 14.7 % (ref 11.5–15.5)
WBC: 10.7 10*3/uL — ABNORMAL HIGH (ref 4.0–10.5)

## 2012-03-31 LAB — TROPONIN I: Troponin I: <0.3 ng/mL (ref <0.30)

## 2012-03-31 LAB — BASIC METABOLIC PANEL
BUN: 13 mg/dL (ref 6–23)
Chloride: 107 mEq/L (ref 96–112)
GFR calc Af Amer: >90 mL/min (ref >90)
GFR calc non Af Amer: >90 mL/min (ref >90)
Potassium: 3.6 mEq/L (ref 3.5–5.1)

## 2012-03-31 NOTE — ED Notes (Signed)
States that it is center chest and her heart feels like it is fluttering. Radiates to back. Pain began yesterday.

## 2012-04-01 NOTE — ED Provider Notes (Signed)
History     CSN: [id number]  Arrival date & time 03/31/12  1904   First MD Initiated Contact with Patient 03/31/12 2009      Chief Complaint  Patient presents with  . Chest Pain  . Tachycardia     The history is provided by the patient.   patient presents emergency department with complaint of palpitations.  She has no chest pain but does feel like her heart is "fluttering".  She's had no syncope or near-syncope.  She denies jaw pain shoulder pain or shortness of breath.  No recent surgery or long travel.  She denies unilateral and bilateral leg swelling.  No history of DVT or pulmonary embolism. Symptoms are mild/moderate   Past Medical History  Diagnosis Date  . Hypertension   . Sleep apnea     does not wear CPAP  . GERD (gastroesophageal reflux disease)   . Arthritis   . Morbid obesity   . Hiatal hernia     Past Surgical History  Procedure Date  . Abdominal hysterectomy   . Cardiovascular stress test 07/25/2007    Negative stress echo; ef 55-60%  . Knee arthroscopy 01/2011    left  . Esophageal manometry 03/07/2012    Procedure: ESOPHAGEAL MANOMETRY (EM);  Surgeon: Mardella Layman, MD;  Location: [facility];  Service: Endoscopy;  Laterality: N/A;    Family History  Problem Relation Age of Onset  . Cervical cancer Mother   . Hypertension Sister   . Diabetes Sister   . Heart disease Neg Hx   . Cancer Neg Hx   . Alcohol abuse Neg Hx   . Early death Neg Hx   . Hearing loss Neg Hx   . Hyperlipidemia Neg Hx   . Kidney disease Neg Hx   . Stroke Neg Hx     History  Substance Use Topics  . Smoking status: Never Smoker   . Smokeless tobacco: Never Used  . Alcohol Use: No    OB History    Grav Para Term Preterm Abortions TAB SAB Ect Mult Living                  Review of Systems  Cardiovascular: Positive for chest pain.  All other systems reviewed and are negative.    Allergies  Oxycodone  Home Medications   Current Outpatient Rx  Name  Route   Sig  Dispense  Refill  . ACETAMINOPHEN 500 MG PO TABS   Oral   Take 1,000 mg by mouth every 6 (six) hours as needed. For pain         . CALCIUM CARBONATE-VITAMIN D 600-400 MG-UNIT PO CHEW   Oral   Chew 1 tablet by mouth 2 (two) times daily.          . IBUPROFEN 200 MG PO TABS   Oral   Take 400 mg by mouth every 8 (eight) hours as needed. For pain.         Marland Kitchen METOPROLOL SUCCINATE ER 50 MG PO TB24   Oral   Take 1 tablet (50 mg total) by mouth daily.   30 tablet   5   . PANTOPRAZOLE SODIUM 40 MG PO TBEC   Oral   Take 1 tablet (40 mg total) by mouth 2 (two) times daily.   30 tablet   11   . ONDANSETRON HCL 4 MG PO TABS   Oral   Take 1 tablet (4 mg total) by mouth every 12 (twelve) hours as  needed for nausea.   30 tablet   3     BP 143/74  Pulse 69  Temp 98.4 F (36.9 C) (Oral)  Resp 20  SpO2 96%  Physical Exam  Nursing note and vitals reviewed. Constitutional: She is oriented to person, place, and time. She appears well-developed and well-nourished. No distress.  HENT:  Head: Normocephalic and atraumatic.  Eyes: EOM are normal.  Neck: Normal range of motion.  Cardiovascular: Normal rate, regular rhythm and normal heart sounds.   Pulmonary/Chest: Effort normal and breath sounds normal.  Abdominal: Soft. She exhibits no distension. There is no tenderness.  Musculoskeletal: Normal range of motion.  Neurological: She is alert and oriented to person, place, and time.  Skin: Skin is warm and dry.  Psychiatric: She has a normal mood and affect. Judgment normal.    ED Course  Procedures (including critical care time)  Labs Reviewed  CBC - Abnormal; Notable for the following:    WBC 10.7 (*)     Hemoglobin 11.5 (*)     HCT 35.6 (*)     All other components within normal limits  BASIC METABOLIC PANEL  TROPONIN I  LAB REPORT - SCANNED   Dg Chest 2 View  03/31/2012  *RADIOLOGY REPORT*  Clinical Data: Chest pain.  Tachycardia.  CHEST - 2 VIEW  Comparison:  01/07/2012.  Findings: The cardiac silhouette remains borderline enlarged. Clear lungs with normal vascularity.  The interstitial markings remain mildly prominent.  Mild thoracic spine degenerative changes.  IMPRESSION: No acute abnormality.  Stable mild chronic interstitial lung disease.   Original Report Authenticated By: Beckie Salts, M.D.     Date: 04/01/2012  Rate: 66  Rhythm: normal sinus rhythm  QRS Axis: normal  Intervals: normal  ST/T Wave abnormalities: normal  Conduction Disutrbances: none  Narrative Interpretation:   Old EKG Reviewed: No significant changes noted  I personally reviewed the imaging tests through PACS system I reviewed available ER/hospitalization records through the EMR      1. Palpitations       MDM  Palpitations without syncope or near-syncope.  EKG is normal sinus rhythm.  Workup in emergency apartment is without abnormalities.  The patient was on telemetry and no arrhythmias were noted.  Outpatient cardiology followup for likely Holter monitoring.        Lyanne Co, MD 04/01/12 1146

## 2012-06-20 ENCOUNTER — Other Ambulatory Visit: Payer: Self-pay | Admitting: *Deleted

## 2012-06-20 MED ORDER — METOPROLOL SUCCINATE ER 50 MG PO TB24: 50.0000 mg | ORAL_TABLET | Freq: Every day | ORAL | Status: DC

## 2012-06-20 NOTE — Telephone Encounter (Signed)
Fax Received. Refill Completed. Abir Craine Chowoe (R.M.A)   

## 2012-06-27 ENCOUNTER — Encounter: Payer: Self-pay | Admitting: *Deleted

## 2012-06-30 ENCOUNTER — Other Ambulatory Visit (INDEPENDENT_AMBULATORY_CARE_PROVIDER_SITE_OTHER): Payer: BC Managed Care – PPO

## 2012-06-30 ENCOUNTER — Encounter: Payer: Self-pay | Admitting: Internal Medicine

## 2012-06-30 ENCOUNTER — Ambulatory Visit (INDEPENDENT_AMBULATORY_CARE_PROVIDER_SITE_OTHER): Payer: BC Managed Care – PPO | Admitting: Internal Medicine

## 2012-06-30 VITALS — BP 130/86 | HR 78 | Temp 97.8°F | Resp 16 | Ht 65.0 in | Wt 315.0 lb

## 2012-06-30 DIAGNOSIS — N390 Urinary tract infection, site not specified: Secondary | ICD-10-CM

## 2012-06-30 DIAGNOSIS — D649 Anemia, unspecified: Secondary | ICD-10-CM

## 2012-06-30 DIAGNOSIS — D539 Nutritional anemia, unspecified: Secondary | ICD-10-CM | POA: Insufficient documentation

## 2012-06-30 DIAGNOSIS — I10 Essential (primary) hypertension: Secondary | ICD-10-CM

## 2012-06-30 DIAGNOSIS — H8110 Benign paroxysmal vertigo, unspecified ear: Secondary | ICD-10-CM

## 2012-06-30 DIAGNOSIS — D72829 Elevated white blood cell count, unspecified: Secondary | ICD-10-CM

## 2012-06-30 DIAGNOSIS — Z0000 Encounter for general adult medical examination without abnormal findings: Secondary | ICD-10-CM

## 2012-06-30 DIAGNOSIS — M542 Cervicalgia: Secondary | ICD-10-CM

## 2012-06-30 LAB — FOLATE: Folate: 7 ng/mL (ref >5.9)

## 2012-06-30 LAB — URINALYSIS, ROUTINE W REFLEX MICROSCOPIC
Bilirubin Urine: NEGATIVE
Hgb urine dipstick: NEGATIVE
Total Protein, Urine: NEGATIVE
Urine Glucose: NEGATIVE
Urobilinogen, UA: 0.2 (ref 0.0–1.0)

## 2012-06-30 LAB — CBC WITH DIFFERENTIAL/PLATELET
Eosinophils Relative: 3.1 % (ref 0.0–5.0)
Lymphocytes Relative: 27 % (ref 12.0–46.0)
Monocytes Absolute: 1.2 10*3/uL — ABNORMAL HIGH (ref 0.1–1.0)
Monocytes Relative: 8.4 % (ref 3.0–12.0)
Neutrophils Relative %: 60.6 % (ref 43.0–77.0)
Platelets: 268 10*3/uL (ref 150.0–400.0)
RBC: 4.44 Mil/uL (ref 3.87–5.11)
WBC: 13.8 10*3/uL — ABNORMAL HIGH (ref 4.5–10.5)

## 2012-06-30 LAB — VITAMIN B12: Vitamin B-12: 335 pg/mL (ref 211–911)

## 2012-06-30 LAB — IBC PANEL
Iron: 49 ug/dL (ref 42–145)
Transferrin: 241 mg/dL (ref 212.0–360.0)

## 2012-06-30 LAB — COMPREHENSIVE METABOLIC PANEL
Albumin: 3.8 g/dL (ref 3.5–5.2)
BUN: 12 mg/dL (ref 6–23)
CO2: 29 mEq/L (ref 19–32)
Calcium: 9.1 mg/dL (ref 8.4–10.5)
GFR: 120.83 mL/min (ref >60.00)
Glucose, Bld: 89 mg/dL (ref 70–99)
Potassium: 4.3 mEq/L (ref 3.5–5.1)
Sodium: 137 mEq/L (ref 135–145)
Total Protein: 8 g/dL (ref 6.0–8.3)

## 2012-06-30 MED ORDER — MECLIZINE HCL 32 MG PO TABS: 32.0000 mg | ORAL_TABLET | Freq: Three times a day (TID) | ORAL | Status: DC | PRN

## 2012-06-30 MED ORDER — METOPROLOL SUCCINATE ER 50 MG PO TB24: 50.0000 mg | ORAL_TABLET | Freq: Every day | ORAL | Status: DC

## 2012-06-30 MED ORDER — IBUPROFEN 600 MG PO TABS: 600.0000 mg | ORAL_TABLET | Freq: Four times a day (QID) | ORAL | Status: AC | PRN

## 2012-06-30 NOTE — Progress Notes (Signed)
Subjective:    Patient ID: [name], female    DOB: [birth year], 47 y.o.   MRN: [id number]  Neck Pain  This is a recurrent problem. The current episode started more than 1 year ago. The problem occurs intermittently. The problem has been gradually worsening. The pain is associated with nothing. The quality of the pain is described as aching. The pain is at a severity of 3/10. The pain is mild. The symptoms are aggravated by bending and position. The pain is same all the time. Pertinent negatives include no chest pain, fever, headaches, leg pain, numbness, pain with swallowing, paresis, photophobia, syncope, tingling, trouble swallowing, visual change, weakness or weight loss. She has tried acetaminophen for the symptoms. The treatment provided mild relief.      Review of Systems  Constitutional: Negative.  Negative for fever, chills, weight loss, diaphoresis, activity change, appetite change, fatigue and unexpected weight change.  HENT: Positive for neck pain. Negative for hearing loss, sore throat, facial swelling, trouble swallowing, neck stiffness, voice change, sinus pressure and tinnitus.   Eyes: Negative.  Negative for photophobia.  Respiratory: Negative.  Negative for cough, chest tightness, shortness of breath, wheezing and stridor.   Cardiovascular: Negative.  Negative for chest pain, palpitations, leg swelling and syncope.  Gastrointestinal: Negative.  Negative for nausea, vomiting, abdominal pain, diarrhea, constipation and blood in stool.  Endocrine: Negative.   Genitourinary: Negative.  Negative for dysuria, urgency, frequency, hematuria, flank pain, decreased urine volume and difficulty urinating.  Musculoskeletal: Negative for myalgias, back pain, joint swelling, arthralgias and gait problem.  Skin: Negative.   Allergic/Immunologic: Negative.   Neurological: Positive for dizziness (recurrent episodes of dizziness and vertigo) and light-headedness. Negative for tingling,  tremors, seizures, syncope, speech difficulty, weakness, numbness and headaches.  Hematological: Negative.  Negative for adenopathy. Does not bruise/bleed easily.  Psychiatric/Behavioral: Negative.        Objective:   Physical Exam  Vitals reviewed. Constitutional: She is oriented to person, place, and time. She appears well-developed and well-nourished.  Non-toxic appearance. She does not have a sickly appearance. She does not appear ill. No distress.  HENT:  Head: Normocephalic and atraumatic.  Mouth/Throat: Oropharynx is clear and moist. No oropharyngeal exudate.  Eyes: Conjunctivae and EOM are normal. Pupils are equal, round, and reactive to light. Right eye exhibits no discharge. Left eye exhibits no discharge. No scleral icterus.  Neck: Normal range of motion. Neck supple. No JVD present. No tracheal deviation present. No thyromegaly present.  Cardiovascular: Normal rate, regular rhythm, normal heart sounds and intact distal pulses.  Exam reveals no gallop and no friction rub.   No murmur heard. Pulmonary/Chest: Effort normal and breath sounds normal. No stridor. No respiratory distress. She has no wheezes. She has no rales. She exhibits no tenderness.  Abdominal: Soft. Bowel sounds are normal. She exhibits no distension and no mass. There is no tenderness. There is no rebound and no guarding.  Musculoskeletal: Normal range of motion. She exhibits no edema and no tenderness.       Cervical back: Normal. She exhibits normal range of motion, no tenderness, no bony tenderness, no swelling, no edema, no deformity, no laceration, no pain, no spasm and normal pulse.  Lymphadenopathy:    She has no cervical adenopathy.  Neurological: She is alert and oriented to person, place, and time. She has normal strength. She displays no atrophy, no tremor and normal reflexes. No cranial nerve deficit or sensory deficit. She exhibits normal muscle tone. She displays a negative  Romberg sign. She displays  no seizure activity. Coordination and gait normal. She displays no Babinski's sign on the right side. She displays no Babinski's sign on the left side.  Reflex Scores:      Tricep reflexes are 1+ on the right side and 1+ on the left side.      Bicep reflexes are 1+ on the right side and 1+ on the left side.      Brachioradialis reflexes are 1+ on the right side and 1+ on the left side.      Patellar reflexes are 1+ on the right side and 1+ on the left side.      Achilles reflexes are 1+ on the right side and 1+ on the left side. Skin: Skin is warm and dry. No rash noted. She is not diaphoretic. No erythema. No pallor.  Psychiatric: She has a normal mood and affect. Her behavior is normal. Judgment and thought content normal.     Lab Results  Component Value Date   WBC 10.7* 03/31/2012   HGB 11.5* 03/31/2012   HCT 35.6* 03/31/2012   PLT 267 03/31/2012   GLUCOSE 94 03/31/2012   CHOL 188 11/04/2011   TRIG 89.0 11/04/2011   HDL 51.40 11/04/2011   LDLCALC 119* 11/04/2011   ALT 11 01/07/2012   AST 17 01/07/2012   NA 141 03/31/2012   K 3.6 03/31/2012   CL 107 03/31/2012   CREATININE 0.66 03/31/2012   BUN 13 03/31/2012   CO2 26 03/31/2012   TSH 1.19 11/04/2011   INR 1.0 07/06/2007   HGBA1C  Value: 5.7 (NOTE)   The ADA recommends the following therapeutic goals for glycemic   control related to Hgb A1C measurement:   Goal of Therapy:   < 7.0% Hgb A1C   Action Suggested:  > 8.0% Hgb A1C   Ref:  Diabetes Care, 22, Suppl. 1, 1999 07/06/2007       Assessment & Plan:

## 2012-06-30 NOTE — Assessment & Plan Note (Signed)
She will try ibuprofen for the pain I have asked her to get C-spine films done to look for ddd, bone spurs, etc

## 2012-06-30 NOTE — Assessment & Plan Note (Signed)
Repeat CBC today 

## 2012-06-30 NOTE — Assessment & Plan Note (Signed)
Her neuro exam is normal She describes this as a chronic, recurrent problem She has responded well in the past to meclizine so will try that again

## 2012-06-30 NOTE — Patient Instructions (Signed)
Vertigo Vertigo means you feel like you or your surroundings are moving when they are not. Vertigo can be dangerous if it occurs when you are at work, driving, or performing difficult activities.  CAUSES  Vertigo occurs when there is a conflict of signals sent to your brain from the visual and sensory systems in your body. There are many different causes of vertigo, including:  Infections, especially in the inner ear.  A bad reaction to a drug or misuse of alcohol and medicines.  Withdrawal from drugs or alcohol.  Rapidly changing positions, such as lying down or rolling over in bed.  A migraine headache.  Decreased blood flow to the brain.  Increased pressure in the brain from a head injury, infection, tumor, or bleeding. SYMPTOMS  You may feel as though the world is spinning around or you are falling to the ground. Because your balance is upset, vertigo can cause nausea and vomiting. You may have involuntary eye movements (nystagmus). DIAGNOSIS  Vertigo is usually diagnosed by physical exam. If the cause of your vertigo is unknown, your caregiver may perform imaging tests, such as an MRI scan (magnetic resonance imaging). TREATMENT  Most cases of vertigo resolve on their own, without treatment. Depending on the cause, your caregiver may prescribe certain medicines. If your vertigo is related to body position issues, your caregiver may recommend movements or procedures to correct the problem. In rare cases, if your vertigo is caused by certain inner ear problems, you may need surgery. HOME CARE INSTRUCTIONS   Follow your caregiver's instructions.  Avoid driving.  Avoid operating heavy machinery.  Avoid performing any tasks that would be dangerous to you or others during a vertigo episode.  Tell your caregiver if you notice that certain medicines seem to be causing your vertigo. Some of the medicines used to treat vertigo episodes can actually make them worse in some people. SEEK  IMMEDIATE MEDICAL CARE IF:   Your medicines do not relieve your vertigo or are making it worse.  You develop problems with talking, walking, weakness, or using your arms, hands, or legs.  You develop severe headaches.  Your nausea or vomiting continues or gets worse.  You develop visual changes.  A family member notices behavioral changes.  Your condition gets worse. MAKE SURE YOU:  Understand these instructions.  Will watch your condition.  Will get help right away if you are not doing well or get worse. Document Released: 12/03/2004 Document Revised: 05/18/2011 Document Reviewed: 09/11/2010 [employer] Patient Information 2013 [employer].  

## 2012-06-30 NOTE — Assessment & Plan Note (Signed)
Her BP is well controlled 

## 2012-06-30 NOTE — Assessment & Plan Note (Signed)
She has no s/s Will recheck her UA and ur clx today

## 2012-06-30 NOTE — Assessment & Plan Note (Signed)
I will recheck his CBC and will look at her vitamin levels as well

## 2012-07-01 ENCOUNTER — Other Ambulatory Visit: Payer: Self-pay | Admitting: *Deleted

## 2012-07-01 DIAGNOSIS — I10 Essential (primary) hypertension: Secondary | ICD-10-CM

## 2012-07-01 MED ORDER — METOPROLOL SUCCINATE ER 50 MG PO TB24
50.0000 mg | ORAL_TABLET | Freq: Every day | ORAL | Status: DC
Start: 2012-07-01 — End: 2013-05-05

## 2012-07-01 NOTE — Telephone Encounter (Signed)
Fax Received. Refill Completed. Ksean Vale Chowoe (R.M.A)   

## 2012-07-02 ENCOUNTER — Encounter: Payer: Self-pay | Admitting: Internal Medicine

## 2012-07-04 ENCOUNTER — Telehealth: Payer: Self-pay

## 2012-07-04 NOTE — Telephone Encounter (Signed)
Pharmacy notified.

## 2012-07-04 NOTE — Telephone Encounter (Signed)
ok 

## 2012-07-04 NOTE — Telephone Encounter (Signed)
Received fax from pharmacy stating that Rayford Halsted is not available. They would like to know if ok to change to meclizine 25mf.

## 2012-07-29 ENCOUNTER — Emergency Department (HOSPITAL_BASED_OUTPATIENT_CLINIC_OR_DEPARTMENT_OTHER): Payer: BC Managed Care – PPO

## 2012-07-29 ENCOUNTER — Encounter (HOSPITAL_BASED_OUTPATIENT_CLINIC_OR_DEPARTMENT_OTHER): Payer: Self-pay | Admitting: *Deleted

## 2012-07-29 ENCOUNTER — Emergency Department (HOSPITAL_BASED_OUTPATIENT_CLINIC_OR_DEPARTMENT_OTHER)
Admission: EM | Admit: 2012-07-29 | Discharge: 2012-07-29 | Disposition: A | Discharge status: Home / Self Care | Payer: BC Managed Care – PPO | Attending: Emergency Medicine | Admitting: Emergency Medicine

## 2012-07-29 DIAGNOSIS — Z8669 Personal history of other diseases of the nervous system and sense organs: Secondary | ICD-10-CM | POA: Insufficient documentation

## 2012-07-29 DIAGNOSIS — K219 Gastro-esophageal reflux disease without esophagitis: Secondary | ICD-10-CM | POA: Insufficient documentation

## 2012-07-29 DIAGNOSIS — Z79899 Other long term (current) drug therapy: Secondary | ICD-10-CM | POA: Insufficient documentation

## 2012-07-29 DIAGNOSIS — R5383 Other fatigue: Secondary | ICD-10-CM | POA: Insufficient documentation

## 2012-07-29 DIAGNOSIS — R002 Palpitations: Secondary | ICD-10-CM | POA: Insufficient documentation

## 2012-07-29 DIAGNOSIS — I10 Essential (primary) hypertension: Secondary | ICD-10-CM | POA: Insufficient documentation

## 2012-07-29 DIAGNOSIS — R5381 Other malaise: Secondary | ICD-10-CM | POA: Insufficient documentation

## 2012-07-29 DIAGNOSIS — Z8719 Personal history of other diseases of the digestive system: Secondary | ICD-10-CM | POA: Insufficient documentation

## 2012-07-29 DIAGNOSIS — M129 Arthropathy, unspecified: Secondary | ICD-10-CM | POA: Insufficient documentation

## 2012-07-29 LAB — BASIC METABOLIC PANEL
BUN: 11 mg/dL (ref 6–23)
Chloride: 103 mEq/L (ref 96–112)
Creatinine, Ser: 0.6 mg/dL (ref 0.50–1.10)
Glucose, Bld: 91 mg/dL (ref 70–99)
Potassium: 3.8 mEq/L (ref 3.5–5.1)

## 2012-07-29 LAB — CBC
HCT: 38.4 % (ref 36.0–46.0)
Hemoglobin: 12.7 g/dL (ref 12.0–15.0)
MCV: 84.6 fL (ref 78.0–100.0)
WBC: 11.6 10*3/uL — ABNORMAL HIGH (ref 4.0–10.5)

## 2012-07-29 MED ORDER — ASPIRIN 81 MG PO CHEW
324.0000 mg | CHEWABLE_TABLET | Freq: Once | ORAL | Status: AC
  Administered 2012-07-29: 324 mg via ORAL
  Filled 2012-07-29: qty 4

## 2012-07-29 NOTE — ED Notes (Signed)
Pt. Has no complaints of nausea, vomiting or sweating.  Pt. Is calm, alert and oriented.

## 2012-07-29 NOTE — ED Notes (Signed)
Chest pain since noon with radiation into her left arm.

## 2012-07-29 NOTE — ED Provider Notes (Signed)
History     CSN: [id number]  Arrival date & time 07/29/12  1712   First MD Initiated Contact with Patient 07/29/12 1800      Chief Complaint  Patient presents with  . Chest Pain    (Consider location/radiation/quality/duration/timing/severity/associated sxs/prior treatment) HPI Pt presenting with c/o sensation of heart fluttering associated with chest discomfort which has been ongoing since noon today.  She has no sob, no diaphoresis, no nausea associated.  She has felt more fatigued than normal today.  She states she recently started a walking program but the symptoms did not come on while she was exerting herself.  She states symptoms feel very similar to her ED visit in January of this year.  She states she has had 2 negative stress tests in the past.  Also has an appointment scheduled with cardiology in approx 2 weeks. No leg swelling, no recent travel or surgery, no hx of DVT/PE.  There are no other associated systemic symptoms, there are no other alleviating or modifying factors.   Past Medical History  Diagnosis Date  . Hypertension   . Sleep apnea     does not wear CPAP  . GERD (gastroesophageal reflux disease)   . Arthritis   . Morbid obesity   . Hiatal hernia     Past Surgical History  Procedure Laterality Date  . Abdominal hysterectomy    . Cardiovascular stress test  07/25/2007    Negative stress echo; ef 55-60%  . Knee arthroscopy  01/2011    left  . Esophageal manometry  03/07/2012    Procedure: ESOPHAGEAL MANOMETRY (EM);  Surgeon: Mardella Layman, MD;  Location: [facility];  Service: Endoscopy;  Laterality: N/A;    Family History  Problem Relation Age of Onset  . Cervical cancer Mother   . Hypertension Sister   . Diabetes Sister   . Heart disease Neg Hx   . Cancer Neg Hx   . Alcohol abuse Neg Hx   . Early death Neg Hx   . Hearing loss Neg Hx   . Hyperlipidemia Neg Hx   . Kidney disease Neg Hx   . Stroke Neg Hx     History  Substance Use Topics   . Smoking status: Never Smoker   . Smokeless tobacco: Never Used  . Alcohol Use: No    OB History   Grav Para Term Preterm Abortions TAB SAB Ect Mult Living                  Review of Systems ROS reviewed and all otherwise negative except for mentioned in HPI  Allergies  Oxycodone  Home Medications   Current Outpatient Rx  Name  Route  Sig  Dispense  Refill  . acetaminophen (TYLENOL) 500 MG tablet   Oral   Take 1,000 mg by mouth every 6 (six) hours as needed. For pain         . Calcium Carbonate-Vitamin D (CALTRATE 600+D) 600-400 MG-UNIT per chew tablet   Oral   Chew 1 tablet by mouth 2 (two) times daily.          . meclizine (ANTIVERT) 32 MG tablet   Oral   Take 1 tablet (32 mg total) by mouth 3 (three) times daily as needed.   50 tablet   2   . metoprolol succinate (TOPROL-XL) 50 MG 24 hr tablet   Oral   Take 1 tablet (50 mg total) by mouth daily.   90 tablet   0  NO REFILLS UNTIL APPOINTMENT   . ondansetron (ZOFRAN) 4 MG tablet   Oral   Take 1 tablet (4 mg total) by mouth every 12 (twelve) hours as needed for nausea.   30 tablet   3   . pantoprazole (PROTONIX) 40 MG tablet   Oral   Take 1 tablet (40 mg total) by mouth 2 (two) times daily.   30 tablet   11     BP 120/63  Pulse 67  Temp(Src) 98.3 F (36.8 C) (Oral)  Resp 20  Wt 315 lb (142.883 kg)  BMI 52.42 kg/m2  SpO2 98% Vitals reviewed Physical Exam Physical Examination: General appearance - alert, well appearing, and in no distress Mental status - alert, oriented to person, place, and time Eyes - no conjunctival injection, no scleral icterus Mouth - mucous membranes moist, pharynx normal without lesions Chest - clear to auscultation, no wheezes, rales or rhonchi, symmetric air entry Heart - normal rate, regular rhythm, normal S1, S2, no murmurs, rubs, clicks or gallops Abdomen - soft, nontender, nondistended, no masses or organomegaly Extremities - peripheral pulses normal, no  pedal edema, no clubbing or cyanosis Skin - normal coloration and turgor, no rashes  ED Course  Procedures (including critical care time)   Date: 07/29/2012  Rate: 69  Rhythm: normal sinus rhythm  QRS Axis: normal  Intervals: normal  ST/T Wave abnormalities: normal  Conduction Disutrbances: none  Narrative Interpretation: unremarkable, no significant change from prior ekg of 03/31/12      Labs Reviewed  CBC - Abnormal; Notable for the following:    WBC 11.6 (*)    All other components within normal limits  BASIC METABOLIC PANEL  TROPONIN I   Dg Chest 2 View  07/29/2012   *RADIOLOGY REPORT*  Clinical Data: Chest pain  CHEST - 2 VIEW  Comparison: 03/31/2012  Findings: Borderline cardiomegaly.  Lungs are clear.  No pleural effusion.  No pneumothorax.  IMPRESSION: No acute cardiopulmonary disease.   Original Report Authenticated By: Jolaine Click, M.D.     1. Palpitations       MDM  Pt presenting with sensation of heart fluttering/palpitations, no actual chest pain- discomfort associated with heart fluttering.  Labs and EKG reassuring as well as CXR.  Pt advised to ararnge for f/u with cardiology- she had similar symptoms at her visit to the ED in January- has not yet seen cardiology in followup but has an appointment scheduled in June        Ethelda Chick, MD 07/30/12 [phone]

## 2012-08-05 ENCOUNTER — Ambulatory Visit (INDEPENDENT_AMBULATORY_CARE_PROVIDER_SITE_OTHER): Payer: BC Managed Care – PPO | Admitting: Internal Medicine

## 2012-08-05 ENCOUNTER — Encounter: Payer: Self-pay | Admitting: Internal Medicine

## 2012-08-05 VITALS — BP 124/84 | HR 81 | Temp 98.7°F | Resp 16 | Wt 316.0 lb

## 2012-08-05 DIAGNOSIS — D72829 Elevated white blood cell count, unspecified: Secondary | ICD-10-CM

## 2012-08-05 DIAGNOSIS — I10 Essential (primary) hypertension: Secondary | ICD-10-CM

## 2012-08-05 NOTE — Patient Instructions (Signed)

## 2012-08-05 NOTE — Progress Notes (Signed)
  Subjective:    Patient ID: [name], female    DOB: 11-28-1965, 47 y.o.   MRN: [id number]  Hypertension This is a chronic problem. The current episode started more than 1 year ago. The problem has been gradually improving since onset. The problem is controlled. Pertinent negatives include no anxiety, blurred vision, chest pain, headaches, malaise/fatigue, neck pain, orthopnea, palpitations, peripheral edema, PND, shortness of breath or sweats. Past treatments include beta blockers. The current treatment provides moderate improvement. Compliance problems include exercise and diet.       Review of Systems  Constitutional: Negative.  Negative for malaise/fatigue.  HENT: Negative.  Negative for neck pain.   Eyes: Negative.  Negative for blurred vision.  Respiratory: Negative.  Negative for shortness of breath.   Cardiovascular: Negative.  Negative for chest pain, palpitations, orthopnea and PND.  Gastrointestinal: Negative.   Endocrine: Negative.   Genitourinary: Negative.   Musculoskeletal: Negative.   Skin: Negative.   Allergic/Immunologic: Negative.   Neurological: Negative.  Negative for headaches.  Hematological: Negative.   Psychiatric/Behavioral: Negative.        Objective:   Physical Exam  Vitals reviewed. Constitutional: She is oriented to person, place, and time. She appears well-developed and well-nourished. No distress.  HENT:  Head: Normocephalic and atraumatic.  Mouth/Throat: Oropharynx is clear and moist. No oropharyngeal exudate.  Eyes: Conjunctivae are normal. Right eye exhibits no discharge. Left eye exhibits no discharge. No scleral icterus.  Neck: Normal range of motion. Neck supple. No JVD present. No tracheal deviation present. No thyromegaly present.  Cardiovascular: Normal rate, regular rhythm, normal heart sounds and intact distal pulses.  Exam reveals no gallop and no friction rub.   No murmur heard. Pulmonary/Chest: Effort normal and breath sounds  normal. No stridor. No respiratory distress. She has no wheezes. She has no rales. She exhibits no tenderness.  Abdominal: Soft. Bowel sounds are normal. She exhibits no distension and no mass. There is no tenderness. There is no rebound and no guarding.  Musculoskeletal: Normal range of motion. She exhibits no edema and no tenderness.  Lymphadenopathy:    She has no cervical adenopathy.  Neurological: She is oriented to person, place, and time.  Skin: Skin is warm and dry. No rash noted. She is not diaphoretic. No erythema. No pallor.  Psychiatric: She has a normal mood and affect. Her behavior is normal. Judgment and thought content normal.     Lab Results  Component Value Date   WBC 11.6* 07/29/2012   HGB 12.7 07/29/2012   HCT 38.4 07/29/2012   PLT 285 07/29/2012   GLUCOSE 91 07/29/2012   CHOL 188 11/04/2011   TRIG 89.0 11/04/2011   HDL 51.40 11/04/2011   LDLCALC 119* 11/04/2011   ALT 18 06/30/2012   AST 20 06/30/2012   NA 139 07/29/2012   K 3.8 07/29/2012   CL 103 07/29/2012   CREATININE 0.60 07/29/2012   BUN 11 07/29/2012   CO2 25 07/29/2012   TSH 1.63 06/30/2012   INR 1.0 07/06/2007   HGBA1C  Value: 5.7 (NOTE)   The ADA recommends the following therapeutic goals for glycemic   control related to Hgb A1C measurement:   Goal of Therapy:   < 7.0% Hgb A1C   Action Suggested:  > 8.0% Hgb A1C   Ref:  Diabetes Care, 22, Suppl. 1, 1999 07/06/2007       Assessment & Plan:

## 2012-08-08 ENCOUNTER — Encounter: Payer: Self-pay | Admitting: Internal Medicine

## 2012-08-08 NOTE — Assessment & Plan Note (Signed)
Her BP is well controlled 

## 2012-08-08 NOTE — Assessment & Plan Note (Signed)
This is stable and appears to be a benign issue

## 2012-08-17 ENCOUNTER — Emergency Department (HOSPITAL_COMMUNITY)
Admission: EM | Admit: 2012-08-17 | Discharge: 2012-08-17 | Disposition: A | Discharge status: Home / Self Care | Payer: BC Managed Care – PPO | Attending: Emergency Medicine | Admitting: Emergency Medicine

## 2012-08-17 ENCOUNTER — Telehealth: Payer: Self-pay | Admitting: *Deleted

## 2012-08-17 ENCOUNTER — Encounter (HOSPITAL_COMMUNITY): Payer: Self-pay

## 2012-08-17 DIAGNOSIS — R1013 Epigastric pain: Secondary | ICD-10-CM | POA: Insufficient documentation

## 2012-08-17 DIAGNOSIS — Z79899 Other long term (current) drug therapy: Secondary | ICD-10-CM | POA: Insufficient documentation

## 2012-08-17 DIAGNOSIS — Z8669 Personal history of other diseases of the nervous system and sense organs: Secondary | ICD-10-CM | POA: Insufficient documentation

## 2012-08-17 DIAGNOSIS — Z90710 Acquired absence of both cervix and uterus: Secondary | ICD-10-CM | POA: Insufficient documentation

## 2012-08-17 DIAGNOSIS — R0681 Apnea, not elsewhere classified: Secondary | ICD-10-CM | POA: Insufficient documentation

## 2012-08-17 DIAGNOSIS — I10 Essential (primary) hypertension: Secondary | ICD-10-CM | POA: Insufficient documentation

## 2012-08-17 DIAGNOSIS — Z8719 Personal history of other diseases of the digestive system: Secondary | ICD-10-CM | POA: Insufficient documentation

## 2012-08-17 DIAGNOSIS — K219 Gastro-esophageal reflux disease without esophagitis: Secondary | ICD-10-CM | POA: Insufficient documentation

## 2012-08-17 DIAGNOSIS — Z8739 Personal history of other diseases of the musculoskeletal system and connective tissue: Secondary | ICD-10-CM | POA: Insufficient documentation

## 2012-08-17 MED ORDER — GI COCKTAIL ~~LOC~~
30.0000 mL | Freq: Once | ORAL | Status: AC
  Administered 2012-08-17: 30 mL via ORAL
  Filled 2012-08-17: qty 30

## 2012-08-17 MED ORDER — SUCRALFATE 1 G PO TABS: 1.0000 g | ORAL_TABLET | Freq: Four times a day (QID) | ORAL | Status: DC

## 2012-08-17 MED ORDER — SUCRALFATE 1 G PO TABS
1.0000 g | ORAL_TABLET | Freq: Once | ORAL | Status: AC
  Administered 2012-08-17: 1 g via ORAL
  Filled 2012-08-17: qty 1

## 2012-08-17 NOTE — ED Provider Notes (Signed)
History     CSN: [id number]  Arrival date & time 08/17/12  0503   First MD Initiated Contact with Patient 08/17/12 0518      Chief Complaint  Patient presents with  . Chest Pain  . Gastrophageal Reflux    (Consider location/radiation/quality/duration/timing/severity/associated sxs/prior treatment) HPI 47-year-old female presents to emergency room with complaint of acid reflux symptoms.  Patient has long-standing history of acid reflux, hiatal hernia.  Patient is followed by cardiology for hypertension.  She has had negative stress echo in 2009.  Patient is also followed by Dr. Jarold Motto with GI.  Patient is currently taking Protonix 40 mg twice a day.  She reports she was recently started on a steroid taper for a rash.  She completed yesterday.  Yesterday afternoon around noon, she ate and noticed that she began to have symptoms.  Immediately, during and after eating.  This was described as a sharp, burning, pain in her upper abdomen that extended into her chest and up into her throat.  The symptoms got a little bit better, but worsened tonight, while she was at work.  At one point she felt so bad.  She felt that she might pass out.  Symptoms have eased off some.  No shortness of breath, no diaphoresis.  Patient took Maalox with improvement in her symptoms.  Aside from hypertension, no other cardiac risk factors.  No family history of coronary disease.  Past Medical History  Diagnosis Date  . Hypertension   . Sleep apnea     does not wear CPAP  . GERD (gastroesophageal reflux disease)   . Arthritis   . Morbid obesity   . Hiatal hernia     Past Surgical History  Procedure Laterality Date  . Abdominal hysterectomy    . Cardiovascular stress test  07/25/2007    Negative stress echo; ef 55-60%  . Knee arthroscopy  01/2011    left  . Esophageal manometry  03/07/2012    Procedure: ESOPHAGEAL MANOMETRY (EM);  Surgeon: Mardella Layman, MD;  Location: [facility];  Service: Endoscopy;   Laterality: N/A;    Family History  Problem Relation Age of Onset  . Cervical cancer Mother   . Hypertension Sister   . Diabetes Sister   . Heart disease Neg Hx   . Cancer Neg Hx   . Alcohol abuse Neg Hx   . Early death Neg Hx   . Hearing loss Neg Hx   . Hyperlipidemia Neg Hx   . Kidney disease Neg Hx   . Stroke Neg Hx     History  Substance Use Topics  . Smoking status: Never Smoker   . Smokeless tobacco: Never Used  . Alcohol Use: No    OB History   Grav Para Term Preterm Abortions TAB SAB Ect Mult Living                  Review of Systems  All other systems reviewed and are negative.    Allergies  Oxycodone  Home Medications   Current Outpatient Rx  Name  Route  Sig  Dispense  Refill  . acetaminophen (TYLENOL) 500 MG tablet   Oral   Take 1,000 mg by mouth every 6 (six) hours as needed. For pain         . Calcium Carbonate-Vitamin D (CALTRATE 600+D) 600-400 MG-UNIT per chew tablet   Oral   Chew 1 tablet by mouth 2 (two) times daily.          Marland Kitchen  meclizine (ANTIVERT) 32 MG tablet   Oral   Take 32 mg by mouth 3 (three) times daily as needed for dizziness.         . metoprolol succinate (TOPROL-XL) 50 MG 24 hr tablet   Oral   Take 1 tablet (50 mg total) by mouth daily.   90 tablet   0     NO REFILLS UNTIL APPOINTMENT   . pantoprazole (PROTONIX) 40 MG tablet   Oral   Take 1 tablet (40 mg total) by mouth 2 (two) times daily.   30 tablet   11   . ondansetron (ZOFRAN) 4 MG tablet   Oral   Take 1 tablet (4 mg total) by mouth every 12 (twelve) hours as needed for nausea.   30 tablet   3     BP 135/72  Pulse 81  Temp(Src) 98.1 F (36.7 C) (Oral)  Resp 18  SpO2 98%  Physical Exam  Nursing note and vitals reviewed. Constitutional: She is oriented to person, place, and time. She appears well-developed and well-nourished.  HENT:  Head: Normocephalic and atraumatic.  Nose: Nose normal.  Mouth/Throat: Oropharynx is clear and moist.   Eyes: Conjunctivae and EOM are normal. Pupils are equal, round, and reactive to light.  Neck: Normal range of motion. Neck supple. No JVD present. No tracheal deviation present. No thyromegaly present.  Cardiovascular: Normal rate, regular rhythm, normal heart sounds and intact distal pulses.  Exam reveals no gallop and no friction rub.   No murmur heard. Pulmonary/Chest: Effort normal and breath sounds normal. No stridor. No respiratory distress. She has no wheezes. She has no rales. She exhibits no tenderness.  Abdominal: Soft. Bowel sounds are normal. She exhibits no distension and no mass. There is tenderness (Epigastric tenderness). There is no rebound and no guarding.  Musculoskeletal: Normal range of motion. She exhibits no edema and no tenderness.  Lymphadenopathy:    She has no cervical adenopathy.  Neurological: She is alert and oriented to person, place, and time. She exhibits normal muscle tone. Coordination normal.  Skin: Skin is warm and dry. No rash noted. No erythema. No pallor.  Psychiatric: She has a normal mood and affect. Her behavior is normal. Judgment and thought content normal.    ED Course  Procedures (including critical care time)  Labs Reviewed - No data to display No results found.   Date: 08/17/2012  Rate: 70  Rhythm: normal sinus rhythm  QRS Axis: normal  Intervals: normal  ST/T Wave abnormalities: normal  Conduction Disutrbances:none  Narrative Interpretation:   Old EKG Reviewed: none available   1. GERD (gastroesophageal reflux disease)       MDM  47 year old female with worsening of her GERD.  I do not feel symptoms are consistent with ACS or PE.  Patient is feeling better after a GI cocktail.  Will plan to discharge her and have her followup with her primary care doctor as well as her gastroenterologist.  I suspect her increase in symptoms.  Is due to her recent steroid use.        Olivia Mackie, MD 08/17/12 [phone]

## 2012-08-17 NOTE — Telephone Encounter (Signed)
This patient has chest wall pain and her GI workup is negative. She has not need a followup appointment with me but needs to see her primary care ----- Message ----- From: SYSTEM Sent: 08/17/2012 6:41 AM To: Mardella Layman, MD   [name] was seen in the ED and was advised to follow up with you as necessary. It is the sole responsibility of the patient to arrange for an appointment with you or your practice lmom for pt to call back.

## 2012-08-17 NOTE — ED Notes (Signed)
Pt to ed from work. Had had chest pain/burning since yesterday.  Hx of acid reflux but sts this feels worse.

## 2012-08-17 NOTE — ED Notes (Signed)
Pt dc to home. Pt sts meds helped a lot. Pt ambulatory to exit without difficulty. Pt denies need for w/c.

## 2012-08-17 NOTE — ED Notes (Signed)
Pt sts gi cocktail helped.

## 2012-08-22 MED ORDER — ESOMEPRAZOLE MAGNESIUM 40 MG PO CPDR: 40.0000 mg | DELAYED_RELEASE_CAPSULE | Freq: Two times a day (BID) | ORAL | Status: DC

## 2012-08-22 NOTE — Telephone Encounter (Signed)
Pt never called back.

## 2012-08-22 NOTE — Telephone Encounter (Signed)
Pt called back and I informed her her GI workup is negative and Dr Jarold Motto states she needs to see her PCP. Pt reports she can't get Pantoprazole. Called [facility] and pantoprazole is on backorder. Ordered Nexium and we will see if her insurance will pay for it.

## 2012-08-22 NOTE — Addendum Note (Signed)
Addended by: Florene Glen on: 08/22/2012 04:03 PM   Modules accepted: Orders

## 2012-08-30 ENCOUNTER — Encounter: Payer: Self-pay | Admitting: *Deleted

## 2012-09-05 ENCOUNTER — Encounter: Payer: Self-pay | Admitting: Cardiovascular Disease

## 2012-09-05 ENCOUNTER — Ambulatory Visit (INDEPENDENT_AMBULATORY_CARE_PROVIDER_SITE_OTHER): Payer: BC Managed Care – PPO | Admitting: Cardiovascular Disease

## 2012-09-05 VITALS — BP 114/80 | HR 66 | Ht 65.0 in | Wt 317.8 lb

## 2012-09-05 DIAGNOSIS — I10 Essential (primary) hypertension: Secondary | ICD-10-CM

## 2012-09-05 NOTE — Patient Instructions (Addendum)
Your physician recommends that you schedule a follow-up appointment in: as needed  Please follow up with your primary care physician.

## 2012-09-05 NOTE — Progress Notes (Signed)
[name] Date of Birth: 08-05-1965 Medical Record #[id number]  Problem List: 1. Hypertension 2. Morbid obesity 3. GERD   History of Present Illness: [name] is seen today for a work in visit.   She is a 47 year old female with a history of GERD, HTN and morbid obesity. She called earlier this week complaining of palpitations, shortness of breath and dizziness for the past several days. She requested evaluation. She is a former patient of Dr. Silva Bandy. She had a negative stress echo in 2009.   She comes in today. She notes that she has had more in the way of palpitations for the past week. It usually occurs at work. She does work 3rd shift. She does not really use any caffeine. Her spells last for about an hour. She feels like she is going to pass out. No chest pain. Does have DOE, probably due to her weight. She has not tried taking any extra medicine. The spells abate with rest and deep breathing.   September 05, 2012:  [name] is doing well.  She is taking some meds for her acid reflux and her CP have resolved.  She is not having any cardiac issues.    Current Outpatient Prescriptions on File Prior to Visit  Medication Sig Dispense Refill  . acetaminophen (TYLENOL) 500 MG tablet Take 1,000 mg by mouth every 6 (six) hours as needed. For pain      . Calcium Carbonate-Vitamin D (CALTRATE 600+D) 600-400 MG-UNIT per chew tablet Chew 1 tablet by mouth 2 (two) times daily.       . meclizine (ANTIVERT) 32 MG tablet Take 32 mg by mouth 3 (three) times daily as needed for dizziness.      . metoprolol succinate (TOPROL-XL) 50 MG 24 hr tablet Take 1 tablet (50 mg total) by mouth daily.  90 tablet  0  . ondansetron (ZOFRAN) 4 MG tablet Take 1 tablet (4 mg total) by mouth every 12 (twelve) hours as needed for nausea.  30 tablet  3  . pantoprazole (PROTONIX) 40 MG tablet Take 1 tablet (40 mg total) by mouth 2 (two) times daily.  30 tablet  11  . [DISCONTINUED] dexlansoprazole (DEXILANT) 60  MG capsule Take 1 capsule (60 mg total) by mouth daily.  30 capsule  11  . [DISCONTINUED] diphenhydrAMINE (BENADRYL) 50 MG capsule Take 50 mg by mouth every 6 (six) hours as needed.      . [DISCONTINUED] diphenhydramine-acetaminophen (TYLENOL PM) 25-500 MG TABS Take 1 tablet by mouth at bedtime as needed. For pain       No current facility-administered medications on file prior to visit.    Allergies  Allergen Reactions  . Oxycodone Shortness Of Breath    Past Medical History  Diagnosis Date  . Hypertension   . Sleep apnea     does not wear CPAP  . GERD (gastroesophageal reflux disease)   . Arthritis   . Morbid obesity   . Hiatal hernia     Past Surgical History  Procedure Laterality Date  . Abdominal hysterectomy    . Cardiovascular stress test  07/25/2007    Negative stress echo; ef 55-60%  . Knee arthroscopy  01/2011    left  . Esophageal manometry  03/07/2012    Procedure: ESOPHAGEAL MANOMETRY (EM);  Surgeon: Mardella Layman, MD;  Location: [facility];  Service: Endoscopy;  Laterality: N/A;    History  Smoking status  . Never Smoker   Smokeless tobacco  . Never  Used    History  Alcohol Use No    Family History  Problem Relation Age of Onset  . Cervical cancer Mother   . Hypertension Sister   . Diabetes Sister   . Heart disease Neg Hx   . Cancer Neg Hx   . Alcohol abuse Neg Hx   . Early death Neg Hx   . Hearing loss Neg Hx   . Hyperlipidemia Neg Hx   . Kidney disease Neg Hx   . Stroke Neg Hx     Review of Systems: The review of systems is positive for palpitations.  All other systems were reviewed and are negative.  Physical Exam: BP 114/80  Pulse 66  Ht 5\' 5"  (1.651 m)  Wt 317 lb 12.8 oz (144.153 kg)  BMI 52.88 kg/m2 Patient is very pleasant and in no acute distress. She is morbidly obese. Skin is warm and dry. Color is normal.  HEENT is unremarkable. Normocephalic/atraumatic. PERRL. Sclera are nonicteric. Neck is supple. No masses. No JVD.  Lungs are clear. Cardiac exam shows a regular rate and rhythm. Abdomen is obese but soft. Extremities are without edema. Gait and ROM are intact. No gross neurologic deficits noted.  LABORATORY DATA:  ECG:  September 05, 2012:  NSR at 17.  Normal ECG  Assessment / Plan:

## 2012-09-05 NOTE — Assessment & Plan Note (Signed)
Her BP is well controlled.    Will have her see her medical doctor for follow up.  I will see her on as needed basis.

## 2012-10-18 ENCOUNTER — Other Ambulatory Visit: Payer: Self-pay | Admitting: Obstetrics

## 2012-10-18 DIAGNOSIS — N644 Mastodynia: Secondary | ICD-10-CM

## 2012-10-27 ENCOUNTER — Emergency Department (HOSPITAL_COMMUNITY)
Admission: EM | Admit: 2012-10-27 | Discharge: 2012-10-27 | Disposition: A | Discharge status: Home / Self Care | Payer: BC Managed Care – PPO | Source: Home / Self Care

## 2012-10-27 ENCOUNTER — Encounter (HOSPITAL_COMMUNITY): Payer: Self-pay | Admitting: *Deleted

## 2012-10-27 DIAGNOSIS — G43909 Migraine, unspecified, not intractable, without status migrainosus: Secondary | ICD-10-CM

## 2012-10-27 MED ORDER — SUMATRIPTAN SUCCINATE 6 MG/0.5ML ~~LOC~~ SOLN
6.0000 mg | Freq: Once | SUBCUTANEOUS | Status: AC
  Administered 2012-10-27: 6 mg via SUBCUTANEOUS

## 2012-10-27 MED ORDER — SUMATRIPTAN SUCCINATE 6 MG/0.5ML ~~LOC~~ SOLN
SUBCUTANEOUS | Status: AC
  Filled 2012-10-27: qty 0.5

## 2012-10-27 MED ORDER — METOCLOPRAMIDE HCL 5 MG/ML IJ SOLN
10.0000 mg | Freq: Once | INTRAMUSCULAR | Status: AC
  Administered 2012-10-27: 10 mg via INTRAMUSCULAR

## 2012-10-27 MED ORDER — DEXAMETHASONE SODIUM PHOSPHATE 10 MG/ML IJ SOLN
INTRAMUSCULAR | Status: AC
  Filled 2012-10-27: qty 1

## 2012-10-27 MED ORDER — DEXAMETHASONE SODIUM PHOSPHATE 10 MG/ML IJ SOLN
10.0000 mg | Freq: Once | INTRAMUSCULAR | Status: AC
  Administered 2012-10-27: 10 mg via INTRAMUSCULAR

## 2012-10-27 MED ORDER — METOCLOPRAMIDE HCL 5 MG/ML IJ SOLN
INTRAMUSCULAR | Status: AC
  Filled 2012-10-27: qty 2

## 2012-10-27 MED ORDER — METOCLOPRAMIDE HCL 5 MG/ML IJ SOLN: 10.0000 mg | Freq: Once | INTRAMUSCULAR | Status: DC

## 2012-10-27 MED ORDER — KETOROLAC TROMETHAMINE 60 MG/2ML IM SOLN
60.0000 mg | Freq: Once | INTRAMUSCULAR | Status: AC
  Administered 2012-10-27: 60 mg via INTRAMUSCULAR

## 2012-10-27 MED ORDER — KETOROLAC TROMETHAMINE 60 MG/2ML IM SOLN
INTRAMUSCULAR | Status: AC
  Filled 2012-10-27: qty 2

## 2012-10-27 NOTE — ED Notes (Signed)
Pt  Reports  Symptoms  Of  Headache  X  3  Days    Some  Nausea  /  Photophobia  As  Well       Pt  Reports  History  Of  Htn         -      Pt      Reports  Taking  meds  As   Rx            Pt is  Awake  And  Alert and  Oriented       -  Sitting     Upright on  Exam  Table  Speaking in  Complete  sentances

## 2012-10-27 NOTE — ED Provider Notes (Signed)
[name] is a 47 y.o. female who presents to Urgent Care today for migraine. Patient has had a migraine now for 6 days. It is associated with photophobia and phonophobia. She denies any weakness numbness dysarthria loss of coordination or other neurological symptoms. She's tried over-the-counter pain medications which have not been helpful. She denies any vomiting or diarrhea but does note some nausea. She denies any troubles of breathing chest pain or palpitations.    PMH reviewed. Hypertension and morbid obesity History  Substance Use Topics  . Smoking status: Never Smoker   . Smokeless tobacco: Never Used  . Alcohol Use: No   ROS as above Medications reviewed. Current Facility-Administered Medications  Medication Dose Route Frequency Provider Last Rate Last Dose  . dexamethasone (DECADRON) injection 10 mg  10 mg Intramuscular Once Rodolph Bong, MD      . ketorolac (TORADOL) injection 60 mg  60 mg Intramuscular Once Rodolph Bong, MD      . metoCLOPramide (REGLAN) injection 10 mg  10 mg Intramuscular Once Rodolph Bong, MD      . SUMAtriptan (IMITREX) injection 6 mg  6 mg Subcutaneous Once Rodolph Bong, MD       Current Outpatient Prescriptions  Medication Sig Dispense Refill  . acetaminophen (TYLENOL) 500 MG tablet Take 1,000 mg by mouth every 6 (six) hours as needed. For pain      . Calcium Carbonate-Vitamin D (CALTRATE 600+D) 600-400 MG-UNIT per chew tablet Chew 1 tablet by mouth 2 (two) times daily.       . meclizine (ANTIVERT) 32 MG tablet Take 32 mg by mouth 3 (three) times daily as needed for dizziness.      . metoprolol succinate (TOPROL-XL) 50 MG 24 hr tablet Take 1 tablet (50 mg total) by mouth daily.  90 tablet  0  . ondansetron (ZOFRAN) 4 MG tablet Take 1 tablet (4 mg total) by mouth every 12 (twelve) hours as needed for nausea.  30 tablet  3  . pantoprazole (PROTONIX) 40 MG tablet Take 1 tablet (40 mg total) by mouth 2 (two) times daily.  30 tablet  11  .  [DISCONTINUED] dexlansoprazole (DEXILANT) 60 MG capsule Take 1 capsule (60 mg total) by mouth daily.  30 capsule  11  . [DISCONTINUED] diphenhydrAMINE (BENADRYL) 50 MG capsule Take 50 mg by mouth every 6 (six) hours as needed.      . [DISCONTINUED] diphenhydramine-acetaminophen (TYLENOL PM) 25-500 MG TABS Take 1 tablet by mouth at bedtime as needed. For pain        Exam:  BP 143/83  Pulse 68  Temp(Src) 98.5 F (36.9 C) (Oral)  Resp 18  SpO2 97% Gen: Well NAD HEENT: EOMI,  MMM, PERRLA Lungs: CTABL Nl WOB Heart: RRR no MRG Abd: NABS, NT, ND Exts: Non edematous BL  LE, warm and well perfused.  Neuro: Alert and oriented coordination is intact cranial nerves II through XII are intact. Sensation and strength are intact. Normal balance and gait.  No results found for this or any previous visit (from the past 24 hour(s)). No results found.  Assessment and Plan: 47 y.o. female with status migrainosus. Plan to treat with IM Toradol, dexamethasone, Reglan, and subcutaneous Imitrex. Work note for today.  Followup if not improving or as needed.  Discussed warning signs or symptoms. Please see discharge instructions. Patient expresses understanding.      Rodolph Bong, MD 10/27/12 [phone]

## 2012-11-02 ENCOUNTER — Ambulatory Visit
Admission: RE | Admit: 2012-11-02 | Discharge: 2012-11-02 | Disposition: A | Discharge status: Home / Self Care | Payer: BC Managed Care – PPO | Source: Ambulatory Visit | Attending: Obstetrics | Admitting: Obstetrics

## 2012-11-02 DIAGNOSIS — N644 Mastodynia: Secondary | ICD-10-CM

## 2012-11-07 ENCOUNTER — Emergency Department (HOSPITAL_COMMUNITY)
Admission: EM | Admit: 2012-11-07 | Discharge: 2012-11-08 | Disposition: A | Discharge status: Home / Self Care | Payer: BC Managed Care – PPO | Attending: Emergency Medicine | Admitting: Emergency Medicine

## 2012-11-07 ENCOUNTER — Encounter (HOSPITAL_COMMUNITY): Payer: Self-pay | Admitting: *Deleted

## 2012-11-07 DIAGNOSIS — Z885 Allergy status to narcotic agent status: Secondary | ICD-10-CM | POA: Insufficient documentation

## 2012-11-07 DIAGNOSIS — R0781 Pleurodynia: Secondary | ICD-10-CM

## 2012-11-07 DIAGNOSIS — M129 Arthropathy, unspecified: Secondary | ICD-10-CM | POA: Insufficient documentation

## 2012-11-07 DIAGNOSIS — Z8719 Personal history of other diseases of the digestive system: Secondary | ICD-10-CM | POA: Insufficient documentation

## 2012-11-07 DIAGNOSIS — R071 Chest pain on breathing: Secondary | ICD-10-CM | POA: Insufficient documentation

## 2012-11-07 DIAGNOSIS — G44209 Tension-type headache, unspecified, not intractable: Secondary | ICD-10-CM

## 2012-11-07 DIAGNOSIS — G4730 Sleep apnea, unspecified: Secondary | ICD-10-CM | POA: Insufficient documentation

## 2012-11-07 DIAGNOSIS — I10 Essential (primary) hypertension: Secondary | ICD-10-CM | POA: Insufficient documentation

## 2012-11-07 DIAGNOSIS — R51 Headache: Secondary | ICD-10-CM | POA: Insufficient documentation

## 2012-11-07 DIAGNOSIS — Z79899 Other long term (current) drug therapy: Secondary | ICD-10-CM | POA: Insufficient documentation

## 2012-11-07 DIAGNOSIS — K219 Gastro-esophageal reflux disease without esophagitis: Secondary | ICD-10-CM | POA: Insufficient documentation

## 2012-11-07 LAB — CBC WITH DIFFERENTIAL/PLATELET
Eosinophils Absolute: 0.4 10*3/uL (ref 0.0–0.7)
Eosinophils Relative: 3 % (ref 0–5)
HCT: 37.3 % (ref 36.0–46.0)
Hemoglobin: 12.5 g/dL (ref 12.0–15.0)
Lymphs Abs: 5 10*3/uL — ABNORMAL HIGH (ref 0.7–4.0)
MCH: 28.2 pg (ref 26.0–34.0)
MCV: 84 fL (ref 78.0–100.0)
Monocytes Absolute: 1.3 10*3/uL — ABNORMAL HIGH (ref 0.1–1.0)
Monocytes Relative: 9 % (ref 3–12)
RBC: 4.44 MIL/uL (ref 3.87–5.11)

## 2012-11-07 MED ORDER — SODIUM CHLORIDE 0.9 % IV SOLN: 1000.0000 mL | INTRAVENOUS | Status: DC

## 2012-11-07 MED ORDER — METOCLOPRAMIDE HCL 5 MG/ML IJ SOLN
10.0000 mg | Freq: Once | INTRAMUSCULAR | Status: AC
  Administered 2012-11-07: 10 mg via INTRAVENOUS
  Filled 2012-11-07: qty 2

## 2012-11-07 MED ORDER — KETOROLAC TROMETHAMINE 30 MG/ML IJ SOLN
30.0000 mg | Freq: Once | INTRAMUSCULAR | Status: AC
  Administered 2012-11-07: 30 mg via INTRAVENOUS
  Filled 2012-11-07: qty 1

## 2012-11-07 MED ORDER — SODIUM CHLORIDE 0.9 % IV SOLN
1000.0000 mL | Freq: Once | INTRAVENOUS | Status: AC
  Administered 2012-11-07: 1000 mL via INTRAVENOUS

## 2012-11-07 MED ORDER — DIPHENHYDRAMINE HCL 50 MG/ML IJ SOLN
25.0000 mg | Freq: Once | INTRAMUSCULAR | Status: AC
  Administered 2012-11-07: 25 mg via INTRAVENOUS
  Filled 2012-11-07: qty 1

## 2012-11-07 NOTE — ED Provider Notes (Signed)
CSN: [id number]     Arrival date & time 11/07/12  2224 History   First MD Initiated Contact with Patient 11/07/12 2259     Chief Complaint  Patient presents with  . Chest Pain  . Headache   (Consider location/radiation/quality/duration/timing/severity/associated sxs/prior Treatment) Patient is a 47 y.o. female presenting with chest pain and headaches. The history is provided by the patient.  Chest Pain Associated symptoms: headache   Headache She has had a chest pain since about 9 AM today. Pain is both sharp and dull and severe and she rates it at 10/10. It is worse with movement and worse with taking a deep breath. She denies dyspnea, nausea, diaphoresis. She took a dose of ibuprofen which gave her slight, temporary relief. She took a dose of tramadol with no relief. She denies cough, fever, chills. She has not had pain like this before. She's also complaining of a left occipital headache which has been present for about 3 weeks. That is pain is throbbing and she rates it 8/10. Nothing makes it better nothing makes it worse. She was seen at urgent care and given a dose of ketorolac which gave her relief for about one day before the headache recurred.  Past Medical History  Diagnosis Date  . Hypertension   . Sleep apnea     does not wear CPAP  . GERD (gastroesophageal reflux disease)   . Arthritis   . Morbid obesity   . Hiatal hernia    Past Surgical History  Procedure Laterality Date  . Abdominal hysterectomy    . Cardiovascular stress test  07/25/2007    Negative stress echo; ef 55-60%  . Knee arthroscopy  01/2011    left  . Esophageal manometry  03/07/2012    Procedure: ESOPHAGEAL MANOMETRY (EM);  Surgeon: Mardella Layman, MD;  Location: [facility];  Service: Endoscopy;  Laterality: N/A;  . Knee surgery     Family History  Problem Relation Age of Onset  . Cervical cancer Mother   . Hypertension Sister   . Diabetes Sister   . Heart disease Neg Hx   . Cancer Neg Hx   .  Alcohol abuse Neg Hx   . Early death Neg Hx   . Hearing loss Neg Hx   . Hyperlipidemia Neg Hx   . Kidney disease Neg Hx   . Stroke Neg Hx    History  Substance Use Topics  . Smoking status: Never Smoker   . Smokeless tobacco: Never Used  . Alcohol Use: No   OB History   Grav Para Term Preterm Abortions TAB SAB Ect Mult Living                 Review of Systems  Cardiovascular: Positive for chest pain.  Neurological: Positive for headaches.  All other systems reviewed and are negative.    Allergies  Oxycodone  Home Medications   Current Outpatient Rx  Name  Route  Sig  Dispense  Refill  . acetaminophen (TYLENOL) 500 MG tablet   Oral   Take 1,000 mg by mouth every 6 (six) hours as needed. For pain         . Calcium Carbonate-Vitamin D (CALTRATE 600+D) 600-400 MG-UNIT per chew tablet   Oral   Chew 1 tablet by mouth 2 (two) times daily.          Marland Kitchen ibuprofen (ADVIL,MOTRIN) 600 MG tablet   Oral   Take 600 mg by mouth every 6 (six) hours as needed  for pain.         . meclizine (ANTIVERT) 32 MG tablet   Oral   Take 32 mg by mouth 3 (three) times daily as needed for dizziness.         . metoprolol succinate (TOPROL-XL) 50 MG 24 hr tablet   Oral   Take 1 tablet (50 mg total) by mouth daily.   90 tablet   0     NO REFILLS UNTIL APPOINTMENT   . pantoprazole (PROTONIX) 40 MG tablet   Oral   Take 1 tablet (40 mg total) by mouth 2 (two) times daily.   30 tablet   11   . traMADol (ULTRAM) 50 MG tablet   Oral   Take 50 mg by mouth every 6 (six) hours as needed for pain.          BP 148/70  Pulse 76  Temp(Src) 98.4 F (36.9 C) (Oral)  SpO2 96% Physical Exam  Nursing note and vitals reviewed.  47 year old female, resting comfortably and in no acute distress. Vital signs are significant for mild hypertension with blood pressure 140/70. Oxygen saturation is 96%, which is normal. Head is normocephalic and atraumatic. PERRLA, EOMI. Oropharynx is clear.  Fundi show no hemorrhage, exudate, or papilledema. Neck is nontender and supple without adenopathy or JVD. There is tenderness to palpation over the insertion of the left paracervical muscles. Back is nontender and there is no CVA tenderness. Lungs are clear without rales, wheezes, or rhonchi. Chest is mildly tender in the anterior chest wall with tenderness being more prominent on the left than on the right. Heart has regular rate and rhythm without murmur. Abdomen is soft, flat, nontender without masses or hepatosplenomegaly and peristalsis is normoactive. Extremities have no cyanosis or edema, full range of motion is present. Skin is warm and dry without rash. Neurologic: Mental status is normal, cranial nerves are intact, there are no motor or sensory deficits.  ED Course  Procedures (including critical care time) Results for orders placed during the hospital encounter of 11/07/12  CBC WITH DIFFERENTIAL      Result Value Range   WBC 13.7 (*) 4.0 - 10.5 K/uL   RBC 4.44  3.87 - 5.11 MIL/uL   Hemoglobin 12.5  12.0 - 15.0 g/dL   HCT 09.8  11.9 - 14.7 %   MCV 84.0  78.0 - 100.0 fL   MCH 28.2  26.0 - 34.0 pg   MCHC 33.5  30.0 - 36.0 g/dL   RDW 82.9  56.2 - 13.0 %   Platelets 267  150 - 400 K/uL   Neutrophils Relative % 51  43 - 77 %   Neutro Abs 7.0  1.7 - 7.7 K/uL   Lymphocytes Relative 37  12 - 46 %   Lymphs Abs 5.0 (*) 0.7 - 4.0 K/uL   Monocytes Relative 9  3 - 12 %   Monocytes Absolute 1.3 (*) 0.1 - 1.0 K/uL   Eosinophils Relative 3  0 - 5 %   Eosinophils Absolute 0.4  0.0 - 0.7 K/uL   Basophils Relative 0  0 - 1 %   Basophils Absolute 0.1  0.0 - 0.1 K/uL  BASIC METABOLIC PANEL      Result Value Range   Sodium 140  135 - 145 mEq/L   Potassium 3.6  3.5 - 5.1 mEq/L   Chloride 105  96 - 112 mEq/L   CO2 24  19 - 32 mEq/L   Glucose, Bld 103 (*)  70 - 99 mg/dL   BUN 11  6 - 23 mg/dL   Creatinine, Ser 1.30  0.50 - 1.10 mg/dL   Calcium 9.1  8.4 - 86.5 mg/dL   GFR calc non Af  Amer >90  >90 mL/min   GFR calc Af Amer >90  >90 mL/min  TROPONIN I      Result Value Range   Troponin I <0.30  <0.30 ng/mL  D-DIMER, QUANTITATIVE      Result Value Range   D-Dimer, Quant <0.27  0.00 - 0.48 ug/mL-FEU   Dg Chest 2 View  11/08/2012   *RADIOLOGY REPORT*  Clinical Data: Chest pain and headache.  CHEST - 2 VIEW  Comparison: 07/29/2012.  Findings: Chronic mild cardiomegaly.  Mild left base atelectasis or scarring.  Prominent right first costochondral junction, unchanged over multiple prior studies.  No edema, effusion, pneumothorax.  No acute osseous findings.  IMPRESSION: 1. No evidence of acute cardiopulmonary disease. 2.  Mild left lower lung scarring or atelectasis.   Original Report Authenticated By: Tiburcio Pea    Date: 11/07/2012  Rate: 76  Rhythm: normal sinus rhythm  QRS Axis: normal  Intervals: normal  ST/T Wave abnormalities: normal  Conduction Disutrbances:none  Narrative Interpretation: Normal ECG. When compared with ECG of 09/05/2012, no significant changes are seen..  Old EKG Reviewed: unchanged   MDM   1. Pleuritic chest pain   2. Muscle contraction headache    Pleuritic chest pain of uncertain cause. Response to NSAIDs and is somewhat comforting. Headache appears to be muscle contraction headache. Review of old records shows that she was seen on August 21 and given ketorolac, metoclopramide, dexamethasone and sumatriptan. Today, ketorolac, metoclopramide will be repeated along with diphenhydramine. Chest x-ray and ECG will be checked as well as some screening labs.  She feels much better after above noted medication. Laboratory workup was normal including normal d-dimer. She is concerned about her history of GERD and taking NSAIDs that she will be given a prescription for celecoxib. A prescription is also given 4 orphenadrine and metoclopramide. She is to followup with her PCP in the next week.  Dione Booze, MD 11/08/12 [phone]

## 2012-11-07 NOTE — ED Notes (Addendum)
Pt states that she has CP and HA. Pt states the HA has been going on for several days and the CP started today. Pt states that the pain is left chest a constant. Pain gets worse with deep breath.

## 2012-11-08 ENCOUNTER — Emergency Department (HOSPITAL_COMMUNITY): Payer: BC Managed Care – PPO

## 2012-11-08 LAB — BASIC METABOLIC PANEL
BUN: 11 mg/dL (ref 6–23)
Calcium: 9.1 mg/dL (ref 8.4–10.5)
Creatinine, Ser: 0.62 mg/dL (ref 0.50–1.10)
GFR calc non Af Amer: >90 mL/min (ref >90)
Glucose, Bld: 103 mg/dL — ABNORMAL HIGH (ref 70–99)
Potassium: 3.6 mEq/L (ref 3.5–5.1)

## 2012-11-08 MED ORDER — METOCLOPRAMIDE HCL 10 MG PO TABS: 10.0000 mg | ORAL_TABLET | Freq: Four times a day (QID) | ORAL | Status: DC | PRN

## 2012-11-08 MED ORDER — ORPHENADRINE CITRATE ER 100 MG PO TB12: 100.0000 mg | ORAL_TABLET | Freq: Two times a day (BID) | ORAL | Status: DC

## 2012-11-08 MED ORDER — CELECOXIB 200 MG PO CAPS: 200.0000 mg | ORAL_CAPSULE | Freq: Two times a day (BID) | ORAL | Status: DC

## 2013-01-12 ENCOUNTER — Other Ambulatory Visit: Payer: Self-pay

## 2013-01-26 ENCOUNTER — Ambulatory Visit (INDEPENDENT_AMBULATORY_CARE_PROVIDER_SITE_OTHER): Payer: BC Managed Care – PPO | Admitting: Internal Medicine

## 2013-01-26 ENCOUNTER — Encounter: Payer: Self-pay | Admitting: Internal Medicine

## 2013-01-26 VITALS — BP 130/88 | HR 90 | Temp 98.3°F | Wt 311.2 lb

## 2013-01-26 DIAGNOSIS — R079 Chest pain, unspecified: Secondary | ICD-10-CM | POA: Insufficient documentation

## 2013-01-26 DIAGNOSIS — R1011 Right upper quadrant pain: Secondary | ICD-10-CM

## 2013-01-26 DIAGNOSIS — I10 Essential (primary) hypertension: Secondary | ICD-10-CM

## 2013-01-26 MED ORDER — METHOCARBAMOL 500 MG PO TABS: 500.0000 mg | ORAL_TABLET | Freq: Four times a day (QID) | ORAL | Status: DC

## 2013-01-26 NOTE — Progress Notes (Signed)
Subjective:    Patient ID: [name], female    DOB: [birth year], 47 y.o.   MRN: [id number]  HPI  Here to f/u with mild to mod but persitent pain to the right lateral chest/RUQ for 10 days, somewhat pleuritic and tender to touch, mostly at the RUQ/costal margin and seems to radiate towards the right side and back, but Pt denies chest pain, increased sob or doe, wheezing, orthopnea, PND, increased LE swelling, palpitations, dizziness or syncope.  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  Also with mild nausea and ? Low grade temp.  Denies urinary symptoms such as dysuria, frequency, urgency, flank pain, hematuria or n/v, fever, chills. Denies worsening reflux, dysphagia, vomiting, bowel change or blood. Past Medical History  Diagnosis Date  . Hypertension   . Sleep apnea     does not wear CPAP  . GERD (gastroesophageal reflux disease)   . Arthritis   . Morbid obesity   . Hiatal hernia    Past Surgical History  Procedure Laterality Date  . Abdominal hysterectomy    . Cardiovascular stress test  07/25/2007    Negative stress echo; ef 55-60%  . Knee arthroscopy  01/2011    left  . Esophageal manometry  03/07/2012    Procedure: ESOPHAGEAL MANOMETRY (EM);  Surgeon: Mardella Layman, MD;  Location: [facility];  Service: Endoscopy;  Laterality: N/A;  . Knee surgery      reports that she has never smoked. She has never used smokeless tobacco. She reports that she does not drink alcohol or use illicit drugs. family history includes Cervical cancer in her mother; Diabetes in her sister; Hypertension in her sister. There is no history of Heart disease, Cancer, Alcohol abuse, Early death, Hearing loss, Hyperlipidemia, Kidney disease, or Stroke. Allergies  Allergen Reactions  . Oxycodone Shortness Of Breath   Current Outpatient Prescriptions on File Prior to Visit  Medication  Sig Dispense Refill  . acetaminophen (TYLENOL) 500 MG tablet Take 1,000 mg by mouth every 6 (six) hours as needed. For pain      . Calcium Carbonate-Vitamin D (CALTRATE 600+D) 600-400 MG-UNIT per chew tablet Chew 1 tablet by mouth 2 (two) times daily.       . celecoxib (CELEBREX) 200 MG capsule Take 1 capsule (200 mg total) by mouth 2 (two) times daily.  30 capsule  0  . ibuprofen (ADVIL,MOTRIN) 600 MG tablet Take 600 mg by mouth every 6 (six) hours as needed for pain.      . meclizine (ANTIVERT) 32 MG tablet Take 32 mg by mouth 3 (three) times daily as needed for dizziness.      . metoCLOPramide (REGLAN) 10 MG tablet Take 1 tablet (10 mg total) by mouth every 6 (six) hours as needed (Nausea or headache).  30 tablet  0  . metoprolol succinate (TOPROL-XL) 50 MG 24 hr tablet Take 1 tablet (50 mg total) by mouth daily.  90 tablet  0  . pantoprazole (PROTONIX) 40 MG tablet Take 1 tablet (40 mg total) by mouth 2 (two) times daily.  30 tablet  11  . traMADol (ULTRAM) 50 MG tablet Take 50 mg by mouth every 6 (six) hours as needed for pain.      . [DISCONTINUED] dexlansoprazole (DEXILANT) 60 MG capsule Take 1 capsule (60 mg total) by mouth daily.  30 capsule  11  . [DISCONTINUED]  diphenhydrAMINE (BENADRYL) 50 MG capsule Take 50 mg by mouth every 6 (six) hours as needed.      . [DISCONTINUED] diphenhydramine-acetaminophen (TYLENOL PM) 25-500 MG TABS Take 1 tablet by mouth at bedtime as needed. For pain       No current facility-administered medications on file prior to visit.   Review of Systems  Constitutional: Negative for unexpected weight change, or unusual diaphoresis  HENT: Negative for tinnitus.   Eyes: Negative for photophobia and visual disturbance.  Respiratory: Negative for choking and stridor.   Gastrointestinal: Negative for vomiting and blood in stool.  Genitourinary: Negative for hematuria and decreased urine volume.  Musculoskeletal: Negative for acute joint swelling Skin: Negative for  color change and wound.  Neurological: Negative for tremors and numbness other than noted  Psychiatric/Behavioral: Negative for decreased concentration or  hyperactivity.       Objective:   Physical Exam BP 130/88  Pulse 90  Temp(Src) 98.3 F (36.8 C) (Oral)  Wt 311 lb 4 oz (141.182 kg)  SpO2 97% VS noted,  Constitutional: Pt appears well-developed and well-nourished.  HENT: Head: NCAT.  Right Ear: External ear normal.  Left Ear: External ear normal.  Eyes: Conjunctivae and EOM are normal. Pupils are equal, round, and reactive to light.  Neck: Normal range of motion. Neck supple.  Cardiovascular: Normal rate and regular rhythm.   Pulmonary/Chest: Effort normal and breath sounds normal.  Abd:  Soft, NT, non-distended, + BS Neurological: Pt is alert. Not confused  Skin: Skin is warm. No erythema.  Psychiatric: Pt behavior is normal. Thought content normal.  Has some tender at the right costal margin    Assessment & Plan:

## 2013-01-26 NOTE — Progress Notes (Signed)
Pre-visit discussion using our clinic review tool. No additional management support is needed unless otherwise documented below in the visit note.  

## 2013-01-26 NOTE — Patient Instructions (Signed)
Please take all new medication as prescribed - the robaxin as needed Please continue all other medications as before Please go to the XRAY Department in the Basement (go straight as you get off the elevator) for the x-ray testing tomorrow Please go to the LAB in the Basement (turn left off the elevator) for the tests to be done tomorrow You will be contacted regarding the referral for: abdomen ultrasound to check the gallbladder You will be contacted by phone if any changes need to be made immediately.  Otherwise, you will receive a letter about your results with an explanation, but please check with MyChart first.  Please remember to sign up for My Chart if you have not done so, as this will be important to you in the future with finding out test results, communicating by private email, and scheduling acute appointments online when needed.

## 2013-01-27 ENCOUNTER — Other Ambulatory Visit (INDEPENDENT_AMBULATORY_CARE_PROVIDER_SITE_OTHER): Payer: BC Managed Care – PPO

## 2013-01-27 ENCOUNTER — Ambulatory Visit (INDEPENDENT_AMBULATORY_CARE_PROVIDER_SITE_OTHER)
Admission: RE | Admit: 2013-01-27 | Discharge: 2013-01-27 | Disposition: A | Discharge status: Home / Self Care | Payer: BC Managed Care – PPO | Source: Ambulatory Visit | Attending: Internal Medicine | Admitting: Internal Medicine

## 2013-01-27 DIAGNOSIS — R1011 Right upper quadrant pain: Secondary | ICD-10-CM

## 2013-01-27 DIAGNOSIS — R079 Chest pain, unspecified: Secondary | ICD-10-CM

## 2013-01-27 LAB — HEPATIC FUNCTION PANEL
ALT: 19 U/L (ref 0–35)
Bilirubin, Direct: 0.1 mg/dL (ref 0.0–0.3)
Total Protein: 7.7 g/dL (ref 6.0–8.3)

## 2013-01-27 LAB — URINALYSIS, ROUTINE W REFLEX MICROSCOPIC
Leukocytes, UA: NEGATIVE
Specific Gravity, Urine: 1.025 (ref 1.000–1.030)
Urobilinogen, UA: 0.2 (ref 0.0–1.0)

## 2013-01-27 LAB — CBC WITH DIFFERENTIAL/PLATELET
Basophils Absolute: 0.1 10*3/uL (ref 0.0–0.1)
Basophils Relative: 0.6 % (ref 0.0–3.0)
Eosinophils Absolute: 0.3 10*3/uL (ref 0.0–0.7)
Lymphocytes Relative: 22.4 % (ref 12.0–46.0)
MCHC: 33.7 g/dL (ref 30.0–36.0)
Neutrophils Relative %: 68 % (ref 43.0–77.0)
Platelets: 309 10*3/uL (ref 150.0–400.0)
RBC: 4.59 Mil/uL (ref 3.87–5.11)
RDW: 14.4 % (ref 11.5–14.6)

## 2013-01-27 LAB — BASIC METABOLIC PANEL
CO2: 26 mEq/L (ref 19–32)
Chloride: 106 mEq/L (ref 96–112)
Creatinine, Ser: 0.6 mg/dL (ref 0.4–1.2)

## 2013-01-29 NOTE — Assessment & Plan Note (Signed)
?   MSK - for cxr r/o pulm, doubt PE or other CV, to f/u any worsening symptoms or concerns

## 2013-01-29 NOTE — Assessment & Plan Note (Signed)
Cant r/o GB - for u/s eval,  to f/u any worsening symptoms or concerns

## 2013-01-29 NOTE — Assessment & Plan Note (Signed)
stable overall by history and exam, recent data reviewed with pt, and pt to continue medical treatment as before,  to f/u any worsening symptoms or concerns BP Readings from Last 3 Encounters:  01/26/13 130/88  11/08/12 145/83  10/27/12 143/83

## 2013-02-01 ENCOUNTER — Ambulatory Visit
Admission: RE | Admit: 2013-02-01 | Discharge: 2013-02-01 | Disposition: A | Discharge status: Home / Self Care | Payer: BC Managed Care – PPO | Source: Ambulatory Visit | Attending: Internal Medicine | Admitting: Internal Medicine

## 2013-02-01 DIAGNOSIS — R079 Chest pain, unspecified: Secondary | ICD-10-CM

## 2013-02-01 DIAGNOSIS — R1011 Right upper quadrant pain: Secondary | ICD-10-CM

## 2013-03-16 LAB — HM PAP SMEAR: HM Pap smear: NORMAL

## 2013-04-18 IMAGING — US US ABDOMEN COMPLETE
1 series · 13 of 25 positions shown · non-contrast
Comparison: 07/06/2007 ultrasound

CLINICAL DATA: 46-year-old female with abdominal and epigastric
pain.

ABDOMINAL ULTRASOUND COMPLETE

[Series 1: us abdomen complete · 0.32mm/px · 13 of 72 slices shown]
[im 1/72]
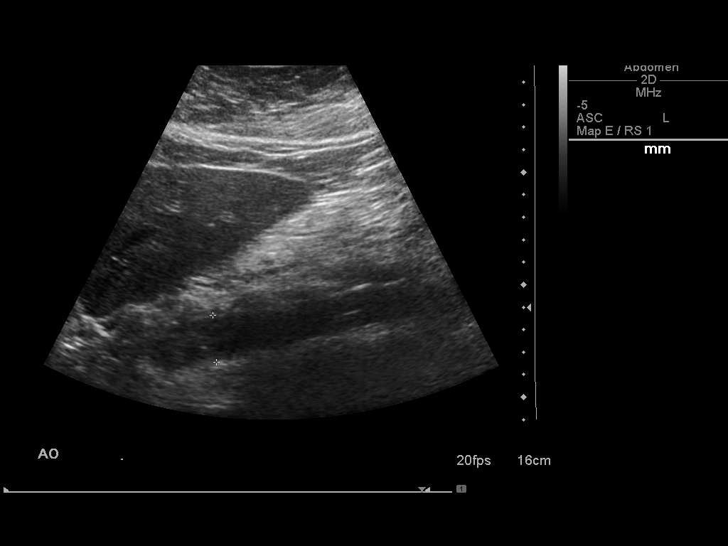
[im 6/72]
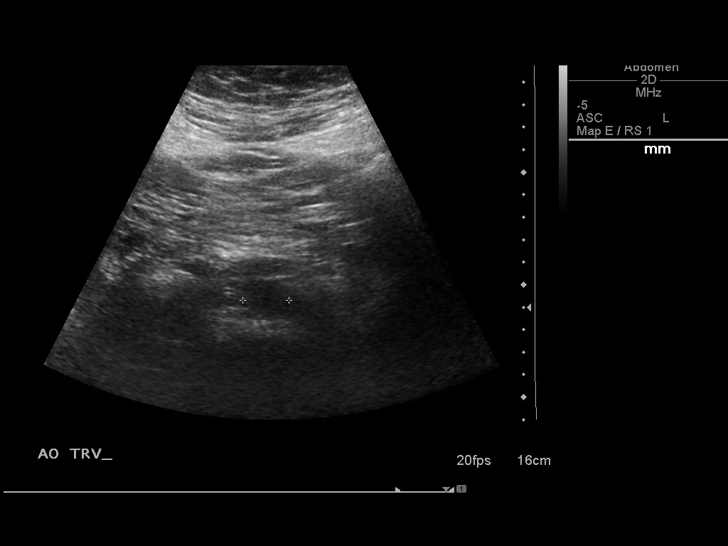
[im 12/72]
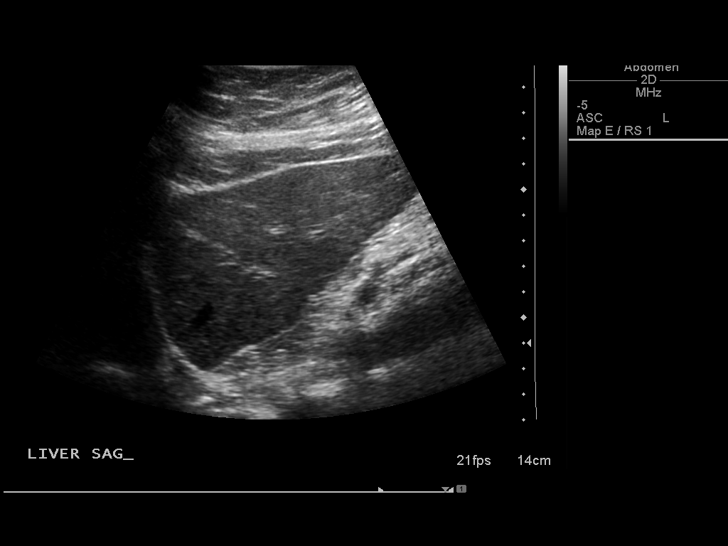
[im 18/72]
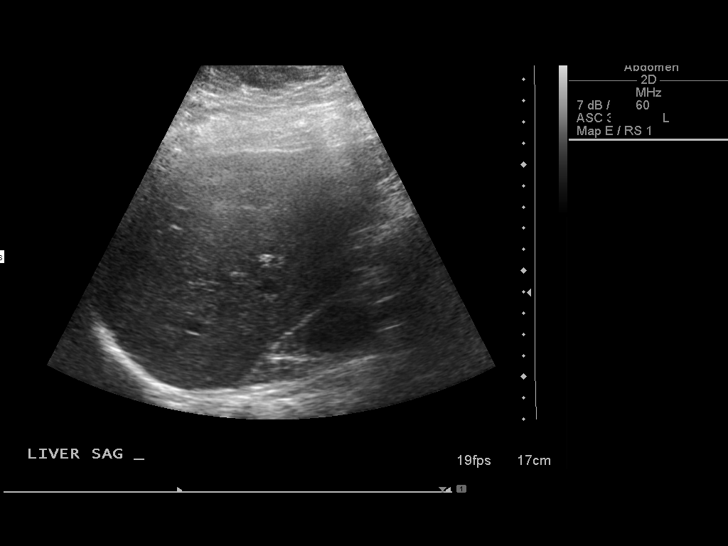
[im 24/72]
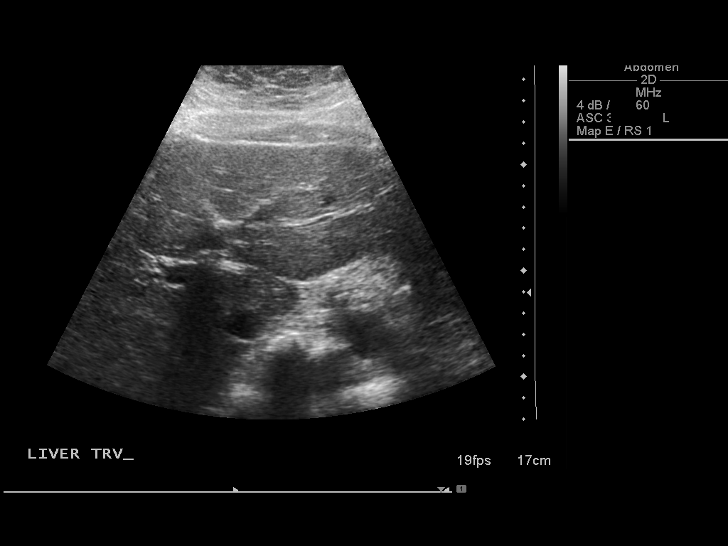
[im 30/72]
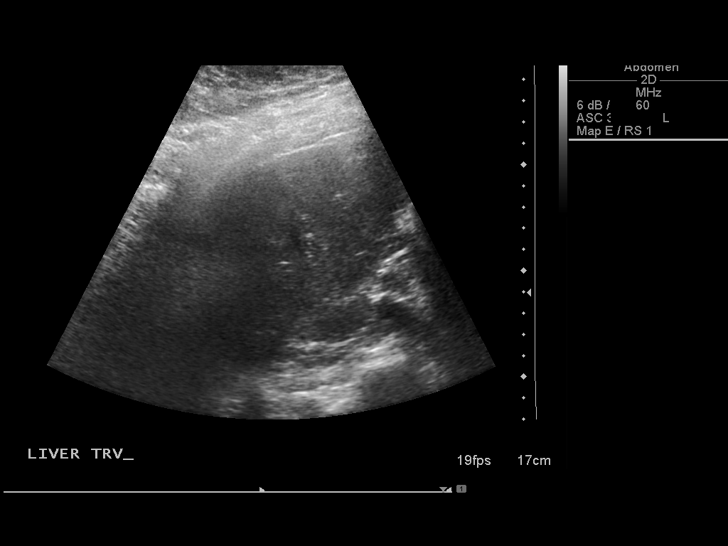
[im 36/72]
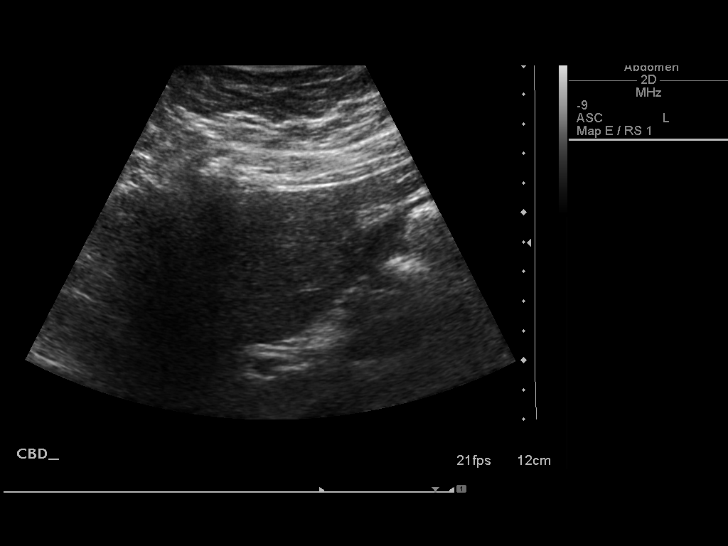
[im 42/72]
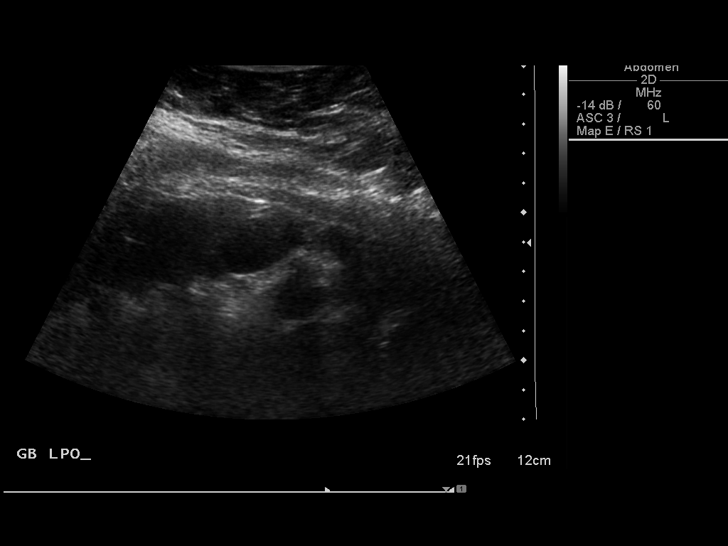
[im 48/72]
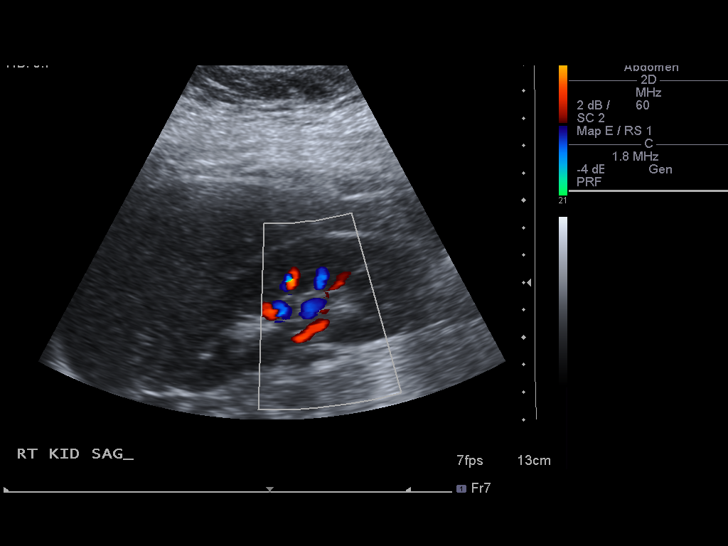
[im 54/72]
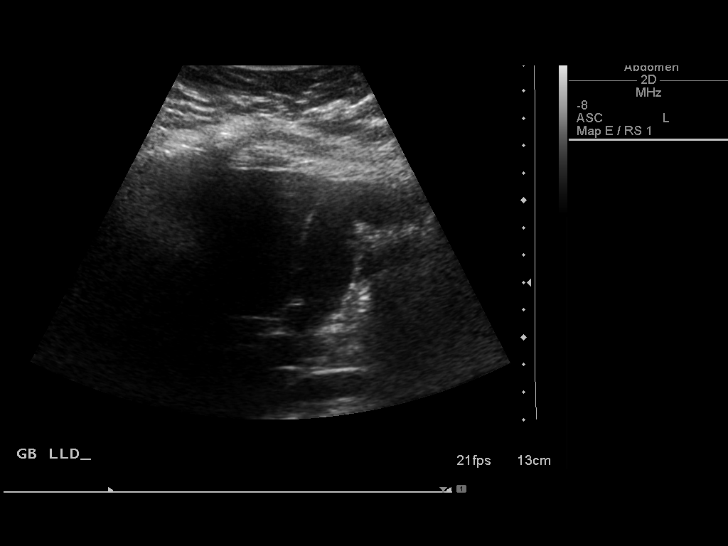
[im 60/72]
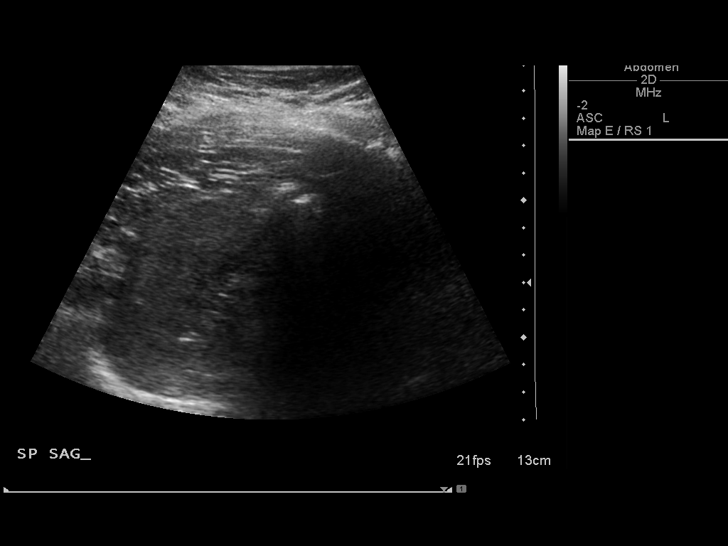
[im 66/72]
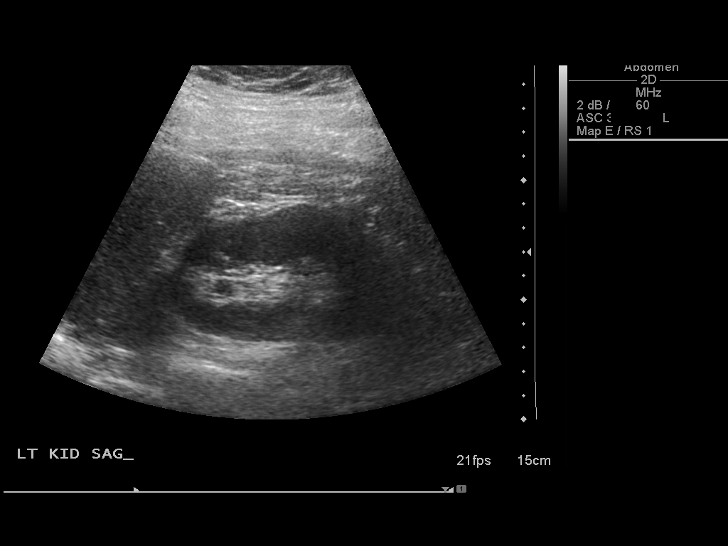
[im 72/72]
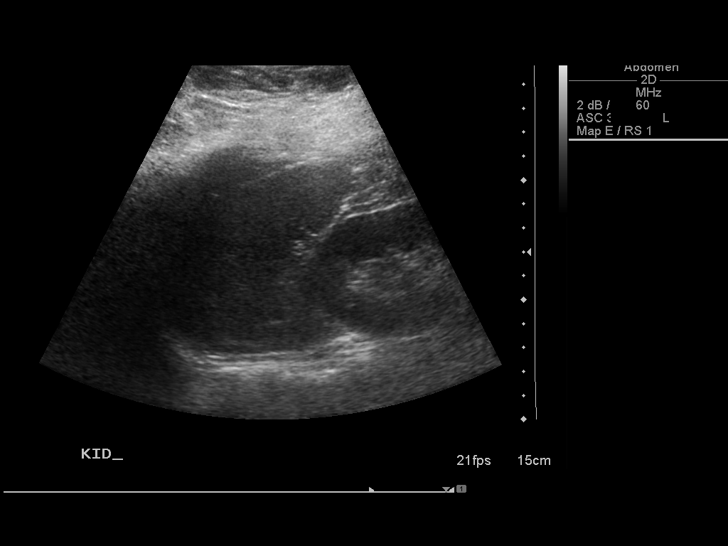

[13 of 25 positions shown; findings below may reference images not displayed]

FINDINGS: Gallbladder:  The gallbladder is unremarkable. There is no evidence
of gallstones, gallbladder wall thickening, or pericholecystic
fluid.

Common Bile Duct:  There is no evidence of intrahepatic or
extrahepatic biliary dilation. The CBD measures 3.7 mm in greatest
diameter.

Liver:  The liver is within normal limits in parenchymal
echogenicity. No focal abnormalities are identified.

IVC:  Appears normal.

Pancreas:  Although the pancreas is difficult to visualize in its
entirety, no focal pancreatic abnormality is identified.

Spleen:  Within normal limits in size and echotexture.

Right kidney:  The right kidney is normal in size and parenchymal
echogenicity.  There is no evidence of solid mass, hydronephrosis
or definite renal calculi.  The right kidney measures 10.8 cm.

Left kidney:  The left kidney is normal in size and parenchymal
echogenicity.  There is no evidence of solid mass, hydronephrosis
or definite renal calculi.   The left kidney measures 10.3 cm.

Abdominal Aorta:  No abdominal aortic aneurysm identified.

There is no evidence of ascites.
IMPRESSION: Negative abdominal ultrasound.

## 2013-05-05 ENCOUNTER — Other Ambulatory Visit: Payer: Self-pay

## 2013-05-05 DIAGNOSIS — I10 Essential (primary) hypertension: Secondary | ICD-10-CM

## 2013-05-05 MED ORDER — METOPROLOL SUCCINATE ER 50 MG PO TB24: 50.0000 mg | ORAL_TABLET | Freq: Every day | ORAL | Status: DC

## 2013-05-25 ENCOUNTER — Ambulatory Visit (INDEPENDENT_AMBULATORY_CARE_PROVIDER_SITE_OTHER): Payer: BC Managed Care – PPO | Admitting: Internal Medicine

## 2013-05-25 ENCOUNTER — Encounter: Payer: Self-pay | Admitting: Internal Medicine

## 2013-05-25 VITALS — BP 150/90 | HR 68 | Temp 98.1°F | Resp 14 | Wt 318.0 lb

## 2013-05-25 DIAGNOSIS — R059 Cough, unspecified: Secondary | ICD-10-CM

## 2013-05-25 DIAGNOSIS — K219 Gastro-esophageal reflux disease without esophagitis: Secondary | ICD-10-CM

## 2013-05-25 DIAGNOSIS — I10 Essential (primary) hypertension: Secondary | ICD-10-CM

## 2013-05-25 DIAGNOSIS — R05 Cough: Secondary | ICD-10-CM

## 2013-05-25 MED ORDER — BENZONATATE 200 MG PO CAPS: 200.0000 mg | ORAL_CAPSULE | Freq: Three times a day (TID) | ORAL | Status: DC | PRN

## 2013-05-25 MED ORDER — HYDROCHLOROTHIAZIDE 12.5 MG PO CAPS: 12.5000 mg | ORAL_CAPSULE | Freq: Every day | ORAL | Status: DC

## 2013-05-25 MED ORDER — AZITHROMYCIN 250 MG PO TABS: ORAL_TABLET | ORAL | Status: DC

## 2013-05-25 NOTE — Patient Instructions (Signed)
Reflux of gastric acid may be asymptomatic as this may occur mainly during sleep.The triggers for reflux  include stress; the "aspirin family" ; alcohol; peppermint; and caffeine (coffee, tea, cola, and chocolate). The aspirin family would include aspirin and the nonsteroidal agents such as ibuprofen &  Naproxen. Tylenol would not cause reflux. If having symptoms ; food & drink should be avoided for @ least 2 hours before going to bed.   Until the cough resolves; take the protein pump inhibitor 30 minutes before breakfast and 30 minutes before the evening meal. Once the cough has been resolved for at least 72 hours; go back to once a day  30 minutes before breakfast.   Minimal Blood Pressure Goal= AVERAGE < 140/90;  Ideal is an AVERAGE < 135/85. This AVERAGE should be calculated from @ least 5-7 BP readings taken @ different times of day on different days of week. You should not respond to isolated BP readings , but rather the AVERAGE for that week .Please bring your  blood pressure cuff to office visits to verify that it is reliable.It  can also be checked against the blood pressure device at the pharmacy. Finger or wrist cuffs are not dependable; an arm cuff is.Fill the  prescription for the BP medication if BP NOT @ goal based on  7 to 14 day average.

## 2013-05-25 NOTE — Progress Notes (Signed)
Pre visit review using our clinic review tool, if applicable. No additional management support is needed unless otherwise documented below in the visit note. 

## 2013-05-25 NOTE — Progress Notes (Signed)
   Subjective:    Patient ID: [name], female    DOB: 01/21/1966, 48 y.o.   MRN: [id number]  HPI  Her symptoms began 05/15/13 as chest congestion and nonproductive cough after some GERD symptoms. She has been exposed to nursing home residents who have been coughing.  She's been using Advil, Tylenol, and Robitussin-DM with minimal response.  The cough is worse at night.  The cough is not associated with wheezing or shortness of breath.  She's never smoked and has no history of asthma. She has GERD  Review of Systems  She specifically denies fever, chills, sweats, frontal headache, facial pain, dental pain, sore throat, nasal purulence, otic pain, or otic discharge. She also has had no extrinsic symptoms of itchy, watery eyes, or sneezing.     Objective:   Physical Exam General appearance: Morbidly obese ;well nourished; no acute distress or increased work of breathing is present.  No  lymphadenopathy about the head, neck, or axilla noted.   Eyes: No conjunctival inflammation or lid edema is present. There is no scleral icterus.  Ears:  External ear exam shows no significant lesions or deformities.  Otoscopic examination reveals clear canals, tympanic membranes are intact bilaterally without bulging, retraction, inflammation or discharge.  Nose:  External nasal examination shows no deformity or inflammation. Nasal mucosa are pink and moist without lesions or exudates. No septal dislocation or deviation.No obstruction to airflow.   Oral exam: Dental hygiene is good; lips and gums are healthy appearing.There is no oropharyngeal erythema or exudate noted. Very crowded oropharynx  Neck:  No deformities, thyromegaly, masses, or tenderness noted.      Heart:  Normal rate and regular rhythm. S1 and S2 normal without gallop, murmur, click, rub or other extra sounds.   Lungs:Chest clear to auscultation; no wheezes, rhonchi,rales ,or rubs present.No increased work of breathing.     Extremities:  No cyanosis, edema, or clubbing  noted    Skin: Warm & dry .         Assessment & Plan:  #1 cough , ? Due to GERD #2 HTN See AVS & orders

## 2013-06-28 ENCOUNTER — Other Ambulatory Visit (INDEPENDENT_AMBULATORY_CARE_PROVIDER_SITE_OTHER): Payer: BC Managed Care – PPO

## 2013-06-28 ENCOUNTER — Ambulatory Visit (INDEPENDENT_AMBULATORY_CARE_PROVIDER_SITE_OTHER): Payer: BC Managed Care – PPO | Admitting: Internal Medicine

## 2013-06-28 ENCOUNTER — Encounter: Payer: Self-pay | Admitting: Internal Medicine

## 2013-06-28 VITALS — BP 134/86 | HR 64 | Ht 65.0 in | Wt 315.6 lb

## 2013-06-28 VITALS — BP 136/90 | HR 72 | Temp 98.2°F | Resp 16 | Ht 65.0 in | Wt 316.0 lb

## 2013-06-28 DIAGNOSIS — M79604 Pain in right leg: Secondary | ICD-10-CM

## 2013-06-28 DIAGNOSIS — M25559 Pain in unspecified hip: Secondary | ICD-10-CM

## 2013-06-28 DIAGNOSIS — K219 Gastro-esophageal reflux disease without esophagitis: Secondary | ICD-10-CM

## 2013-06-28 DIAGNOSIS — R7309 Other abnormal glucose: Secondary | ICD-10-CM

## 2013-06-28 DIAGNOSIS — R7303 Prediabetes: Secondary | ICD-10-CM | POA: Insufficient documentation

## 2013-06-28 DIAGNOSIS — D72829 Elevated white blood cell count, unspecified: Secondary | ICD-10-CM

## 2013-06-28 DIAGNOSIS — I10 Essential (primary) hypertension: Secondary | ICD-10-CM

## 2013-06-28 DIAGNOSIS — M25551 Pain in right hip: Secondary | ICD-10-CM | POA: Insufficient documentation

## 2013-06-28 DIAGNOSIS — M545 Low back pain, unspecified: Secondary | ICD-10-CM

## 2013-06-28 DIAGNOSIS — M25552 Pain in left hip: Secondary | ICD-10-CM

## 2013-06-28 DIAGNOSIS — M79605 Pain in left leg: Secondary | ICD-10-CM | POA: Insufficient documentation

## 2013-06-28 DIAGNOSIS — K224 Dyskinesia of esophagus: Secondary | ICD-10-CM

## 2013-06-28 DIAGNOSIS — Z6841 Body Mass Index (BMI) 40.0 and over, adult: Secondary | ICD-10-CM

## 2013-06-28 LAB — CBC WITH DIFFERENTIAL/PLATELET
BASOS PCT: 0.9 % (ref 0.0–3.0)
Basophils Absolute: 0.1 10*3/uL (ref 0.0–0.1)
EOS PCT: 3 % (ref 0.0–5.0)
Eosinophils Absolute: 0.4 10*3/uL (ref 0.0–0.7)
HCT: 38.8 % (ref 36.0–46.0)
HEMOGLOBIN: 12.9 g/dL (ref 12.0–15.0)
Lymphocytes Relative: 30.6 % (ref 12.0–46.0)
Lymphs Abs: 4.3 10*3/uL — ABNORMAL HIGH (ref 0.7–4.0)
MCHC: 33.4 g/dL (ref 30.0–36.0)
MCV: 83.8 fl (ref 78.0–100.0)
MONO ABS: 1 10*3/uL (ref 0.1–1.0)
Monocytes Relative: 7.2 % (ref 3.0–12.0)
NEUTROS ABS: 8.1 10*3/uL — AB (ref 1.4–7.7)
NEUTROS PCT: 58.3 % (ref 43.0–77.0)
Platelets: 306 10*3/uL (ref 150.0–400.0)
RBC: 4.63 Mil/uL (ref 3.87–5.11)
RDW: 14.5 % (ref 11.5–14.6)
WBC: 13.9 10*3/uL — AB (ref 4.5–10.5)

## 2013-06-28 LAB — HEMOGLOBIN A1C: Hgb A1c MFr Bld: 5.9 % (ref 4.6–6.5)

## 2013-06-28 LAB — BASIC METABOLIC PANEL
BUN: 14 mg/dL (ref 6–23)
CHLORIDE: 104 meq/L (ref 96–112)
CO2: 28 meq/L (ref 19–32)
CREATININE: 0.7 mg/dL (ref 0.4–1.2)
Calcium: 9.5 mg/dL (ref 8.4–10.5)
GFR: 118.29 mL/min (ref >60.00)
Glucose, Bld: 92 mg/dL (ref 70–99)
POTASSIUM: 4 meq/L (ref 3.5–5.1)
SODIUM: 140 meq/L (ref 135–145)

## 2013-06-28 MED ORDER — TRAMADOL HCL 50 MG PO TABS: 50.0000 mg | ORAL_TABLET | Freq: Four times a day (QID) | ORAL | Status: DC | PRN

## 2013-06-28 MED ORDER — PANTOPRAZOLE SODIUM 40 MG PO TBEC: 40.0000 mg | DELAYED_RELEASE_TABLET | Freq: Two times a day (BID) | ORAL | Status: DC

## 2013-06-28 NOTE — Patient Instructions (Signed)
Back Pain, Adult Low back pain is very common. About 1 in 5 people have back pain.The cause of low back pain is rarely dangerous. The pain often gets better over time.About half of people with a sudden onset of back pain feel better in just 2 weeks. About 8 in 10 people feel better by 6 weeks.  CAUSES Some common causes of back pain include:  Strain of the muscles or ligaments supporting the spine.  Wear and tear (degeneration) of the spinal discs.  Arthritis.  Direct injury to the back. DIAGNOSIS Most of the time, the direct cause of low back pain is not known.However, back pain can be treated effectively even when the exact cause of the pain is unknown.Answering your caregiver's questions about your overall health and symptoms is one of the most accurate ways to make sure the cause of your pain is not dangerous. If your caregiver needs more information, he or she may order lab work or imaging tests (X-rays or MRIs).However, even if imaging tests show changes in your back, this usually does not require surgery. HOME CARE INSTRUCTIONS For many people, back pain returns.Since low back pain is rarely dangerous, it is often a condition that people can learn to manageon their own.   Remain active. It is stressful on the back to sit or stand in one place. Do not sit, drive, or stand in one place for more than 30 minutes at a time. Take short walks on level surfaces as soon as pain allows.Try to increase the length of time you walk each day.  Do not stay in bed.Resting more than 1 or 2 days can delay your recovery.  Do not avoid exercise or work.Your body is made to move.It is not dangerous to be active, even though your back may hurt.Your back will likely heal faster if you return to being active before your pain is gone.  Pay attention to your body when you bend and lift. Many people have less discomfortwhen lifting if they bend their knees, keep the load close to their bodies,and  avoid twisting. Often, the most comfortable positions are those that put less stress on your recovering back.  Find a comfortable position to sleep. Use a firm mattress and lie on your side with your knees slightly bent. If you lie on your back, put a pillow under your knees.  Only take over-the-counter or prescription medicines as directed by your caregiver. Over-the-counter medicines to reduce pain and inflammation are often the most helpful.Your caregiver may prescribe muscle relaxant drugs.These medicines help dull your pain so you can more quickly return to your normal activities and healthy exercise.  Put ice on the injured area.  Put ice in a plastic bag.  Place a towel between your skin and the bag.  Leave the ice on for 15-20 minutes, 03-04 times a day for the first 2 to 3 days. After that, ice and heat may be alternated to reduce pain and spasms.  Ask your caregiver about trying back exercises and gentle massage. This may be of some benefit.  Avoid feeling anxious or stressed.Stress increases muscle tension and can worsen back pain.It is important to recognize when you are anxious or stressed and learn ways to manage it.Exercise is a great option. SEEK MEDICAL CARE IF:  You have pain that is not relieved with rest or medicine.  You have pain that does not improve in 1 week.  You have new symptoms.  You are generally not feeling well. SEEK   IMMEDIATE MEDICAL CARE IF:   You have pain that radiates from your back into your legs.  You develop new bowel or bladder control problems.  You have unusual weakness or numbness in your arms or legs.  You develop nausea or vomiting.  You develop abdominal pain.  You feel faint. Document Released: 02/23/2005 Document Revised: 08/25/2011 Document Reviewed: 07/14/2010 [employer] Patient Information 2014 [employer].  

## 2013-06-28 NOTE — Assessment & Plan Note (Signed)
I think she has DJD, will check plain films She will cont the nsaids for pain and I have asker her to try tramadol as well

## 2013-06-28 NOTE — Progress Notes (Signed)
Subjective:    Patient ID: [name], female    DOB: 06/03/1965, 48 y.o.   MRN: [id number]  Back Pain This is a chronic problem. The current episode started more than 1 year ago. The problem occurs constantly. The problem has been gradually worsening since onset. The pain is present in the lumbar spine. Quality: "pressure and aching" The pain radiates to the left thigh and right thigh. The pain is at a severity of 3/10. The pain is mild. The pain is worse during the day. The symptoms are aggravated by bending and position. Stiffness is present in the morning. Associated symptoms include paresthesias (tingling in both thighs). Pertinent negatives include no abdominal pain, bladder incontinence, bowel incontinence, chest pain, dysuria, fever, headaches, leg pain, paresis, pelvic pain, perianal numbness, tingling, weakness or weight loss. Risk factors include obesity and lack of exercise. She has tried NSAIDs and muscle relaxant (she has not tried tramadol yet) for the symptoms. The treatment provided mild relief.      Review of Systems  Constitutional: Negative.  Negative for fever, chills, weight loss, diaphoresis, appetite change and fatigue.  HENT: Negative.   Eyes: Negative.   Respiratory: Negative.  Negative for cough, choking, chest tightness, shortness of breath, wheezing and stridor.   Cardiovascular: Negative.  Negative for chest pain, palpitations and leg swelling.  Gastrointestinal: Negative.  Negative for nausea, vomiting, abdominal pain, diarrhea, constipation, blood in stool, abdominal distention and bowel incontinence.  Endocrine: Negative.   Genitourinary: Negative.  Negative for bladder incontinence, dysuria and pelvic pain.  Musculoskeletal: Positive for arthralgias (aching pain in both hips) and back pain. Negative for gait problem, joint swelling, myalgias, neck pain and neck stiffness.  Skin: Negative.   Allergic/Immunologic: Negative.   Neurological: Positive for  paresthesias (tingling in both thighs). Negative for tingling, weakness and headaches.  Hematological: Negative.  Negative for adenopathy. Does not bruise/bleed easily.  Psychiatric/Behavioral: Negative.        Objective:   Physical Exam  Vitals reviewed. Constitutional: She is oriented to person, place, and time. She appears well-developed and well-nourished. No distress.  HENT:  Head: Normocephalic and atraumatic.  Mouth/Throat: Oropharynx is clear and moist. No oropharyngeal exudate.  Eyes: Conjunctivae are normal. Right eye exhibits no discharge. Left eye exhibits no discharge. No scleral icterus.  Neck: Normal range of motion. Neck supple. No JVD present. No tracheal deviation present. No thyromegaly present.  Cardiovascular: Normal rate, regular rhythm, normal heart sounds and intact distal pulses.  Exam reveals no gallop and no friction rub.   No murmur heard. Pulmonary/Chest: Effort normal and breath sounds normal. No stridor. No respiratory distress. She has no wheezes. She has no rales. She exhibits no tenderness.  Abdominal: Soft. Bowel sounds are normal. She exhibits no distension and no mass. There is no tenderness. There is no rebound and no guarding.  Musculoskeletal: Normal range of motion. She exhibits no edema and no tenderness.       Right hip: Normal.       Left hip: Normal.       Lumbar back: Normal. She exhibits normal range of motion, no tenderness, no bony tenderness, no swelling, no edema, no deformity, no laceration, no pain, no spasm and normal pulse.  Lymphadenopathy:    She has no cervical adenopathy.  Neurological: She is alert and oriented to person, place, and time. She has normal strength. She displays no atrophy, no tremor and normal reflexes. No cranial nerve deficit or sensory deficit. She exhibits normal muscle  tone. She displays a negative Romberg sign. She displays no seizure activity. Coordination and gait normal.  Reflex Scores:      Tricep reflexes  are 1+ on the right side and 1+ on the left side.      Bicep reflexes are 1+ on the right side and 1+ on the left side.      Brachioradialis reflexes are 1+ on the right side and 1+ on the left side.      Patellar reflexes are 0 on the right side and 0 on the left side.      Achilles reflexes are 0 on the right side and 0 on the left side. Neg SLR in BLE  Skin: Skin is warm and dry. No rash noted. She is not diaphoretic. No erythema. No pallor.  Psychiatric: She has a normal mood and affect. Her behavior is normal. Judgment and thought content normal.    Lab Results  Component Value Date   WBC 14.6* 01/27/2013   HGB 12.6 01/27/2013   HCT 37.4 01/27/2013   PLT 309.0 01/27/2013   GLUCOSE 109* 01/27/2013   CHOL 188 11/04/2011   TRIG 89.0 11/04/2011   HDL 51.40 11/04/2011   LDLCALC 119* 11/04/2011   ALT 19 01/27/2013   AST 22 01/27/2013   NA 139 01/27/2013   K 3.6 01/27/2013   CL 106 01/27/2013   CREATININE 0.6 01/27/2013   BUN 10 01/27/2013   CO2 26 01/27/2013   TSH 1.63 06/30/2012   INR 1.0 07/06/2007   HGBA1C  Value: 5.7 (NOTE)   The ADA recommends the following therapeutic goals for glycemic   control related to Hgb A1C measurement:   Goal of Therapy:   < 7.0% Hgb A1C   Action Suggested:  > 8.0% Hgb A1C   Ref:  Diabetes Care, 22, Suppl. 1, 1999 07/06/2007        Assessment & Plan:

## 2013-06-28 NOTE — Assessment & Plan Note (Signed)
I will check her A1C to see if she has developed DM2 

## 2013-06-28 NOTE — Patient Instructions (Signed)
Please call [facility] at [phone] to get set up for the bariatric seminar .  Don't stop your PPI, we have refilled it for a year.  You can get future refills thru your PCP if you like.   I appreciate the opportunity to care for you.

## 2013-06-28 NOTE — Assessment & Plan Note (Signed)
Not realized at time of visit as other documentation reported manometry as normal and also Ba swallow as normal Need to investigate this further - could be source of sxs and also make her less or not a candidate for bariatric surgery

## 2013-06-28 NOTE — Assessment & Plan Note (Signed)
She needs to continue her PPI chronically at this point. Weight loss certainly could help this problem.

## 2013-06-28 NOTE — Progress Notes (Signed)
Pre visit review using our clinic review tool, if applicable. No additional management support is needed unless otherwise documented below in the visit note. 

## 2013-06-28 NOTE — Assessment & Plan Note (Signed)
This appears to be benign I will recheck her CBC today

## 2013-06-28 NOTE — Progress Notes (Signed)
         Subjective:    Patient ID: Oneita HurtSamantha Kuhlman, female    DOB: 07/03/1965, 48 y.o.   MRN: [id number]  HPI Is a very nice African American woman previously followed by Dr. Jarold MottoPatterson for GERD. She's had an extensive workup with EGD, esophageal biopsy showed reflux, gastric emptying study which was normal, and an esophageal manometry which is listed as being normal in the body of previous office notes but had incomplete bolus clearance with high lower esophageal sphincter pressure and a nonspecific esophageal motility disorder. 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 I did not fully appreciate this when she was in the office.  She was doing well on PPI therapy but ran out of it started to have coughing and regurgitation and reflux. She saw Dr. hopper as an urgent patient in primary care recently and he told her to go back on her PPI. She needs a refill.  Wt Readings from Last 3 Encounters:  06/28/13 316 lb (143.337 kg)  06/28/13 315 lb 9.6 oz (143.155 kg)  05/25/13 318 lb (144.244 kg)     Review of Systems As above, has pain in her joints especially her knees and low back pain    Objective:   Physical Exam Morbidly obese pleasant black woman in no acute distress    Assessment & Plan:  Morbid obesity with BMI of 50.0-59.9, adult Recommended she investigate bariatric surgery. Depending upon the accuracy of her esophageal dysmotility diagnosis, this may not be an option. I did not realize this when she was in the office but I will explain to her. I still think it is worthwhile that she go to the seminar. She does plan to go to work out I explained that reduction and caloric intake is absolutely necessary for significant prolonged weight loss.  Esophageal dysmotility Not realized at time of visit as other documentation reported manometry as normal and also Ba swallow as normal Need to investigate this further - could be source of sxs and also make her less or not a  candidate for bariatric surgery. I think repeating a barium swallow is reasonable. We will call her.  GERD (gastroesophageal reflux disease) She needs to continue her PPI chronically at this point. Weight loss certainly could help this problem.

## 2013-06-28 NOTE — Assessment & Plan Note (Signed)
I will check her plain films today to see how severe the DD is For now, she will cont on the nsaids and muscle relaxants I have also asked her to try tramadol for pain

## 2013-06-28 NOTE — Assessment & Plan Note (Signed)
Her BP is well controlled I will check her lytes and renal function today 

## 2013-06-28 NOTE — Assessment & Plan Note (Addendum)
Recommended she investigate bariatric surgery. Depending upon the accuracy of her esophageal dysmotility diagnosis, this may not be an option. I did not realize this when she was in the office but I will explain to her. I still think it is worthwhile that she go to the seminar. She does plan to go to work out I explained that reduction and caloric intake is absolutely necessary for significant prolonged weight loss.

## 2013-06-29 ENCOUNTER — Other Ambulatory Visit: Payer: Self-pay

## 2013-06-29 DIAGNOSIS — K224 Dyskinesia of esophagus: Secondary | ICD-10-CM

## 2013-06-29 NOTE — Progress Notes (Signed)
Spoke with Lyla Sonarrie at [facility] and set up the Barium Sallow test for 06/30/13 at 10 AM, arrive at 9:45 AM, NPO 3 hours.   Pt informed of date and time.

## 2013-06-30 ENCOUNTER — Ambulatory Visit (HOSPITAL_COMMUNITY)
Admission: RE | Admit: 2013-06-30 | Discharge: 2013-06-30 | Disposition: A | Discharge status: Home / Self Care | Payer: BC Managed Care – PPO | Source: Ambulatory Visit | Attending: Internal Medicine | Admitting: Internal Medicine

## 2013-06-30 ENCOUNTER — Encounter: Payer: Self-pay | Admitting: Internal Medicine

## 2013-06-30 DIAGNOSIS — M545 Low back pain, unspecified: Secondary | ICD-10-CM | POA: Insufficient documentation

## 2013-06-30 DIAGNOSIS — M25559 Pain in unspecified hip: Secondary | ICD-10-CM | POA: Insufficient documentation

## 2013-06-30 DIAGNOSIS — M25552 Pain in left hip: Secondary | ICD-10-CM

## 2013-06-30 DIAGNOSIS — M25551 Pain in right hip: Secondary | ICD-10-CM

## 2013-06-30 DIAGNOSIS — K224 Dyskinesia of esophagus: Secondary | ICD-10-CM

## 2013-06-30 DIAGNOSIS — M79604 Pain in right leg: Secondary | ICD-10-CM

## 2013-07-03 NOTE — Progress Notes (Signed)
Quick Note:  Will notify ny My Chart ______

## 2013-08-15 ENCOUNTER — Emergency Department (HOSPITAL_COMMUNITY): Payer: BC Managed Care – PPO

## 2013-08-15 ENCOUNTER — Encounter (HOSPITAL_COMMUNITY): Payer: Self-pay | Admitting: Emergency Medicine

## 2013-08-15 ENCOUNTER — Emergency Department (HOSPITAL_COMMUNITY)
Admission: EM | Admit: 2013-08-15 | Discharge: 2013-08-15 | Disposition: A | Discharge status: Home / Self Care | Payer: BC Managed Care – PPO | Attending: Emergency Medicine | Admitting: Emergency Medicine

## 2013-08-15 DIAGNOSIS — R1310 Dysphagia, unspecified: Secondary | ICD-10-CM

## 2013-08-15 DIAGNOSIS — Z79899 Other long term (current) drug therapy: Secondary | ICD-10-CM | POA: Insufficient documentation

## 2013-08-15 DIAGNOSIS — R110 Nausea: Secondary | ICD-10-CM | POA: Insufficient documentation

## 2013-08-15 DIAGNOSIS — G4730 Sleep apnea, unspecified: Secondary | ICD-10-CM

## 2013-08-15 DIAGNOSIS — K219 Gastro-esophageal reflux disease without esophagitis: Secondary | ICD-10-CM | POA: Insufficient documentation

## 2013-08-15 DIAGNOSIS — I10 Essential (primary) hypertension: Secondary | ICD-10-CM | POA: Insufficient documentation

## 2013-08-15 DIAGNOSIS — R0602 Shortness of breath: Secondary | ICD-10-CM | POA: Insufficient documentation

## 2013-08-15 DIAGNOSIS — M129 Arthropathy, unspecified: Secondary | ICD-10-CM | POA: Insufficient documentation

## 2013-08-15 DIAGNOSIS — J029 Acute pharyngitis, unspecified: Secondary | ICD-10-CM | POA: Insufficient documentation

## 2013-08-15 MED ORDER — ONDANSETRON 4 MG PO TBDP
4.0000 mg | ORAL_TABLET | Freq: Once | ORAL | Status: AC
  Administered 2013-08-15: 4 mg via ORAL
  Filled 2013-08-15: qty 1

## 2013-08-15 MED ORDER — ONDANSETRON HCL 4 MG PO TABS: 4.0000 mg | ORAL_TABLET | Freq: Four times a day (QID) | ORAL | Status: DC

## 2013-08-15 MED ORDER — GI COCKTAIL ~~LOC~~
30.0000 mL | Freq: Once | ORAL | Status: AC
  Administered 2013-08-15: 30 mL via ORAL
  Filled 2013-08-15: qty 30

## 2013-08-15 MED ORDER — DEXAMETHASONE 10 MG/ML FOR PEDIATRIC ORAL USE
8.0000 mg | Freq: Once | INTRAMUSCULAR | Status: AC
  Administered 2013-08-15: 8 mg via ORAL
  Filled 2013-08-15: qty 1

## 2013-08-15 NOTE — ED Notes (Signed)
Patient is in no distress. She denies eating, drinking, or taking any new medications that may have caused this to happen.

## 2013-08-15 NOTE — ED Notes (Signed)
Patient is alert and oriented x3.  She is complaining of difficulty swallowing and states she  Feels that her throat is closing in.  She states that she took 2 benadryl with little relief.

## 2013-08-15 NOTE — ED Provider Notes (Signed)
CSN: [id number]     Arrival date & time 08/15/13  0009 History   First MD Initiated Contact with Patient 08/15/13 0132     Chief Complaint  Patient presents with  . Oral Swelling    throat issues     (Consider location/radiation/quality/duration/timing/severity/associated sxs/prior Treatment) HPI Pt is a 48yo female presenting to ED c/o throat swelling associated with difficulty swelling and states it feels like her throat is closing in.  Pt states symptoms started 2 days ago.  Tonight, she became concerned as she believed the outside of her neck was swelling and closing in.  Reports trying to lie down to sleep but suddenly awakening gasping for breath.  Pt does report being dx with sleep apnea 6 years ago but states she does not use CPAP machine because she is clostraphobic.  Pt also reports hx of GERD, she takes protonix daily. States symptoms do not feel same as GERD. Pt also c/o nausea but denies fever, vomiting or diarrhea. Denies difficulty breathing at this time. Reports taking benadryl with little relief.  Denies known allergies. Denies new food, soaps, lotions, or medications.  Past Medical History  Diagnosis Date  . Hypertension   . Sleep apnea     does not wear CPAP  . GERD (gastroesophageal reflux disease)   . Arthritis   . Morbid obesity   . Hiatal hernia    Past Surgical History  Procedure Laterality Date  . Abdominal hysterectomy    . Cardiovascular stress test  07/25/2007    Negative stress echo; ef 55-60%  . Knee arthroscopy  01/2011    left  . Esophageal manometry  03/07/2012    Procedure: ESOPHAGEAL MANOMETRY (EM);  Surgeon: Mardella Layman, MD;  Location: [facility];  Service: Endoscopy;  Laterality: N/A;  . Knee surgery     Family History  Problem Relation Age of Onset  . Cervical cancer Mother   . Hypertension Sister   . Diabetes Sister   . Heart disease Neg Hx   . Cancer Neg Hx   . Alcohol abuse Neg Hx   . Early death Neg Hx   . Hearing loss Neg Hx    . Hyperlipidemia Neg Hx   . Kidney disease Neg Hx   . Stroke Neg Hx    History  Substance Use Topics  . Smoking status: Never Smoker   . Smokeless tobacco: Never Used  . Alcohol Use: No   OB History   Grav Para Term Preterm Abortions TAB SAB Ect Mult Living                 Review of Systems  Constitutional: Negative for fever and chills.  HENT: Positive for sore throat. Negative for congestion, trouble swallowing and voice change.   Respiratory: Positive for shortness of breath. Negative for cough, choking, chest tightness, wheezing and stridor.   Cardiovascular: Negative for chest pain and palpitations.  Gastrointestinal: Positive for nausea. Negative for vomiting, abdominal pain, diarrhea and constipation.  Musculoskeletal: Negative for neck pain and neck stiffness.  All other systems reviewed and are negative.     Allergies  Oxycodone  Home Medications   Prior to Admission medications   Medication Sig Start Date End Date Taking? Authorizing Provider  acetaminophen (TYLENOL) 500 MG tablet Take 1,000 mg by mouth every 6 (six) hours as needed. For pain    Historical Provider, MD  Calcium Carbonate-Vitamin D (CALTRATE 600+D) 600-400 MG-UNIT per chew tablet Chew 1 tablet by mouth 2 (two) times  daily.     Historical Provider, MD  hydrochlorothiazide (MICROZIDE) 12.5 MG capsule Take 1 capsule (12.5 mg total) by mouth daily. 05/25/13   Pecola Lawless, MD  ibuprofen (ADVIL,MOTRIN) 600 MG tablet Take 600 mg by mouth every 6 (six) hours as needed for pain.    Historical Provider, MD  methocarbamol (ROBAXIN) 500 MG tablet Take 1 tablet (500 mg total) by mouth 4 (four) times daily. 01/26/13   Corwin Levins, MD  metoprolol succinate (TOPROL-XL) 50 MG 24 hr tablet Take 1 tablet (50 mg total) by mouth daily. 05/05/13   Vesta Mixer, MD  ondansetron (ZOFRAN) 4 MG tablet Take 1 tablet (4 mg total) by mouth every 6 (six) hours. 08/15/13   Junius Finner, PA-C  pantoprazole (PROTONIX) 40 MG  tablet Take 1 tablet (40 mg total) by mouth 2 (two) times daily before a meal. 30 mins before breakfast/supper 06/28/13 10/19/14  Iva Boop, MD  traMADol (ULTRAM) 50 MG tablet Take 1 tablet (50 mg total) by mouth every 6 (six) hours as needed. 06/28/13   [name], MD   BP 130/84  Pulse 73  Temp(Src) 97.6 F (36.4 C)  Resp 18  SpO2 96% Physical Exam  Nursing note and vitals reviewed. Constitutional: She appears well-developed and well-nourished. No distress.  Morbidly obese female sitting in exam room, NAD.   HENT:  Head: Normocephalic and atraumatic.  Nose: Nose normal.  Mouth/Throat: Uvula is midline, oropharynx is clear and moist and mucous membranes are normal. She does not have dentures. No oral lesions. No trismus in the jaw. Normal dentition. No dental abscesses, uvula swelling, lacerations or dental caries. No oropharyngeal exudate, posterior oropharyngeal edema, posterior oropharyngeal erythema or tonsillar abscesses.  Oropharynx-clear, no tonsillar erythema, edema, or exudates  Eyes: Conjunctivae are normal. No scleral icterus.  Neck: Normal range of motion. Neck supple. No JVD present. No tracheal deviation present. No thyromegaly present.  No neck swelling appreciated. No cervical adenopathy.   Cardiovascular: Normal rate, regular rhythm and normal heart sounds.   Pulmonary/Chest: Effort normal and breath sounds normal. No stridor. No respiratory distress. She has no wheezes. She has no rales. She exhibits no tenderness.  No respiratory distress, able to speak in full sentences w/o difficulty. Lungs: CTAB  Abdominal: Soft. Bowel sounds are normal. She exhibits no distension and no mass. There is no tenderness. There is no rebound and no guarding.  Musculoskeletal: Normal range of motion.  Lymphadenopathy:    She has no cervical adenopathy.  Neurological: She is alert.  Skin: Skin is warm and dry. She is not diaphoretic.    ED Course  Procedures (including critical  care time) Labs Review Labs Reviewed - No data to display  Imaging Review Dg Neck Soft Tissue  08/15/2013   CLINICAL DATA:  Throat discomfort.  Difficulty swallowing.  EXAM: NECK SOFT TISSUES - 1+ VIEW  COMPARISON:  None.  FINDINGS: There is no evidence of retropharyngeal soft tissue swelling or epiglottic enlargement. The cervical airway is unremarkable and no radio-opaque foreign body identified. Mild spondylosis.  IMPRESSION: Negative.   Electronically Signed   By: Davonna Belling M.D.   On: 08/15/2013 01:08     EKG Interpretation None      MDM   Final diagnoses:  Dysphagia  GERD (gastroesophageal reflux disease)  Sleep apnea    Pt c/o oral swelling and throat swelling, hx of GERD, sleep apnea and esophageal dysmotility.  On exam, pt appears well, NAD. No respiratory distress. Able  to keep down fluids. Oropharynx-clear, no perioral or tongue swelling.  Do not believe pt is having angioedema at this time. Not concerned for emergent process taking place. Reassured pt. Symptoms likely related to GERD and/or sleep apnea as pt states symptoms worse when lying down. Encouraged pt to use her CPAP machine. O2 in ED WNL- 96-100% on RA.  Advised to f/u with her PCP. Return precautions provided. Pt verbalized understanding and agreement with tx plan.     Junius FinnerErin O'Malley, PA-C 08/15/13 2343

## 2013-08-15 NOTE — ED Notes (Signed)
Family at bedside. 

## 2013-08-16 NOTE — ED Provider Notes (Signed)
Medical screening examination/treatment/procedure(s) were conducted as a shared visit with non-physician practitioner(s) or resident  and myself.  I personally evaluated the patient during the encounter and agree with the findings and plan unless otherwise indicated.    I have personally reviewed any xrays and/ or EKG's with the provider and I agree with interpretation.   Patient with throat tightness sensation.patient denies ACE inhibitor medications or or new exposures. No known significant allergies. On exam patient well-appearing, no angioedema, no stridor, neck supple patient tolerating oral fluids and improvement in ER. Decadron ordered and close followup discussed. X-ray reviewed and no signs of acute swelling. Medications  gi cocktail (Maalox,Lidocaine,Donnatal) (30 mLs Oral Given 08/15/13 0236)  ondansetron (ZOFRAN-ODT) disintegrating tablet 4 mg (4 mg Oral Given 08/15/13 0236)  dexamethasone (DECADRON) 10 MG/ML injection for Pediatric ORAL use 8 mg (8 mg Oral Given 08/15/13 0236)    Dysphagia   Enid Skeens, MD 08/16/13 [phone]

## 2013-09-28 ENCOUNTER — Ambulatory Visit (INDEPENDENT_AMBULATORY_CARE_PROVIDER_SITE_OTHER): Payer: BC Managed Care – PPO | Admitting: Internal Medicine

## 2013-09-28 ENCOUNTER — Encounter: Payer: Self-pay | Admitting: Internal Medicine

## 2013-09-28 VITALS — BP 134/104 | HR 83 | Temp 98.0°F | Wt 317.8 lb

## 2013-09-28 DIAGNOSIS — Z6841 Body Mass Index (BMI) 40.0 and over, adult: Secondary | ICD-10-CM

## 2013-09-28 DIAGNOSIS — G4733 Obstructive sleep apnea (adult) (pediatric): Secondary | ICD-10-CM

## 2013-09-28 DIAGNOSIS — I10 Essential (primary) hypertension: Secondary | ICD-10-CM

## 2013-09-28 DIAGNOSIS — K219 Gastro-esophageal reflux disease without esophagitis: Secondary | ICD-10-CM

## 2013-09-28 NOTE — Patient Instructions (Signed)
Reflux of gastric acid may be asymptomatic as this may occur mainly during sleep.The triggers for reflux  include stress; the "aspirin family" ; alcohol; peppermint; and caffeine (coffee, tea, cola, and chocolate). The aspirin family would include aspirin and the nonsteroidal agents such as ibuprofen &  Naproxen. Tylenol would not cause reflux. If having symptoms ; food & drink should be avoided for @ least 2 hours before going to bed.  The Sleep Apnea  referral will be scheduled and you'll be notified of the time. Eat a low-fat diet with lots of fruits and vegetables, up to 7-9 servings per day. Consume less than 30  Grams (preferably ZERO) of sugar per day from foods & drinks with High Fructose Corn Syrup (HFCS) sugar as #1,2,3 or # 4 on label.Whole Foods, [employer] & [employer] do not carry products with HFCS. Follow a  low carb nutrition program such as [place] or The [employer] Sugar Busters  to prevent Diabetes progression . White carbohydrates (potatoes, rice, bread, and pasta) have a high spike of sugar and a high load of sugar. For example a  baked potato has a cup of sugar and a  french fry  2 teaspoons of sugar. Yams, wild  rice, whole grained bread &  wheat pasta have been much lower spike and load of  sugar. Portions should be the size of a deck of cards or your palm.

## 2013-09-28 NOTE — Progress Notes (Signed)
Pre visit review using our clinic review tool, if applicable. No additional management support is needed unless otherwise documented below in the visit note. 

## 2013-09-28 NOTE — Progress Notes (Signed)
   Subjective:    Patient ID: [name], female    DOB: 02/23/1966, 48 y.o.   MRN: [id number]  HPI   She describes intermittent swelling of the upper airway over the last 12 months. This was evaluated in emergency room 6/15; there was no airway compromise.  Isn't associated with swelling of the lips or tongue. She is not on ACE inhibitor.  She does have reflux and is on Protonix twice a day.  She has a diagnosis of sleep apnea which was made by [facility]. She has lost her CPAP machine  She works as a [employer] on third shift. At work her blood pressures range 90/58-119/64. She describes occasional palpitations.    Review of Systems  She denies chest pain or dyspnea.  She also has no hoarseness or swallowing difficulties. She did have a swallowing study which was negative in May of this year.  She denies any signs of rhinosinusitis.     Objective:   Physical Exam   She is a very polite and gentile lady. She is morbidly obese. Small bilateral pterygia There is some decrease in diameter of the right nare. The oral pharynx is markedly crowded due to tonsillar enlargement and mucosal tissues.   Eyes: No conjunctival inflammation or scleral icterus is present.  Oral exam: Dental hygiene is good; lips and gums are healthy appearing.There is no oropharyngeal erythema or exudate noted.   Heart:  Normal rate and regular rhythm. S1 and S2 normal without gallop, murmur, click, rub or other extra sounds     Lungs:Chest clear to auscultation; no wheezes, rhonchi,rales ,or rubs present.No increased work of breathing.   Abdomen:protuberant  Skin:Warm & dry.  Intact without suspicious lesions or rashes ; no jaundice or tenting  Lymphatic: No lymphadenopathy is noted about the head, neck, axilla.                  Assessment & Plan:  #1 upper airway "swelling" which is most likely related to #2 , #3, and #4 and tonsillar hypertrophy  #2 GERD  suboptimally controlled despite PPI twice a day  #3 sleep apnea, untreated  #4 morbid obesity  #4 hypertension;readings by the nurses at work indicates good control. There may be a component of white coat syndrome.  Plan: Pathophysiology of untreated sleep apnea was discussed. The rols of the various factors in her upper airway symptoms were also discussed. She will not consider bariatric surgery.  She states she will consider Belviq; I defer this to her PCP. Dietary interventions discussed.  Oropharyngeal surgery was discussed including its potential complications

## 2013-09-29 ENCOUNTER — Telehealth: Payer: Self-pay | Admitting: Internal Medicine

## 2013-09-29 NOTE — Telephone Encounter (Signed)
Relevant patient education assigned to patient using Emmi. ° °

## 2013-10-12 IMAGING — CR DG CHEST 2V
2 series · 2 of 2 positions shown · non-contrast
Comparison: 03/31/2012

CLINICAL DATA: Chest pain

CHEST - 2 VIEW

[w chest pa]
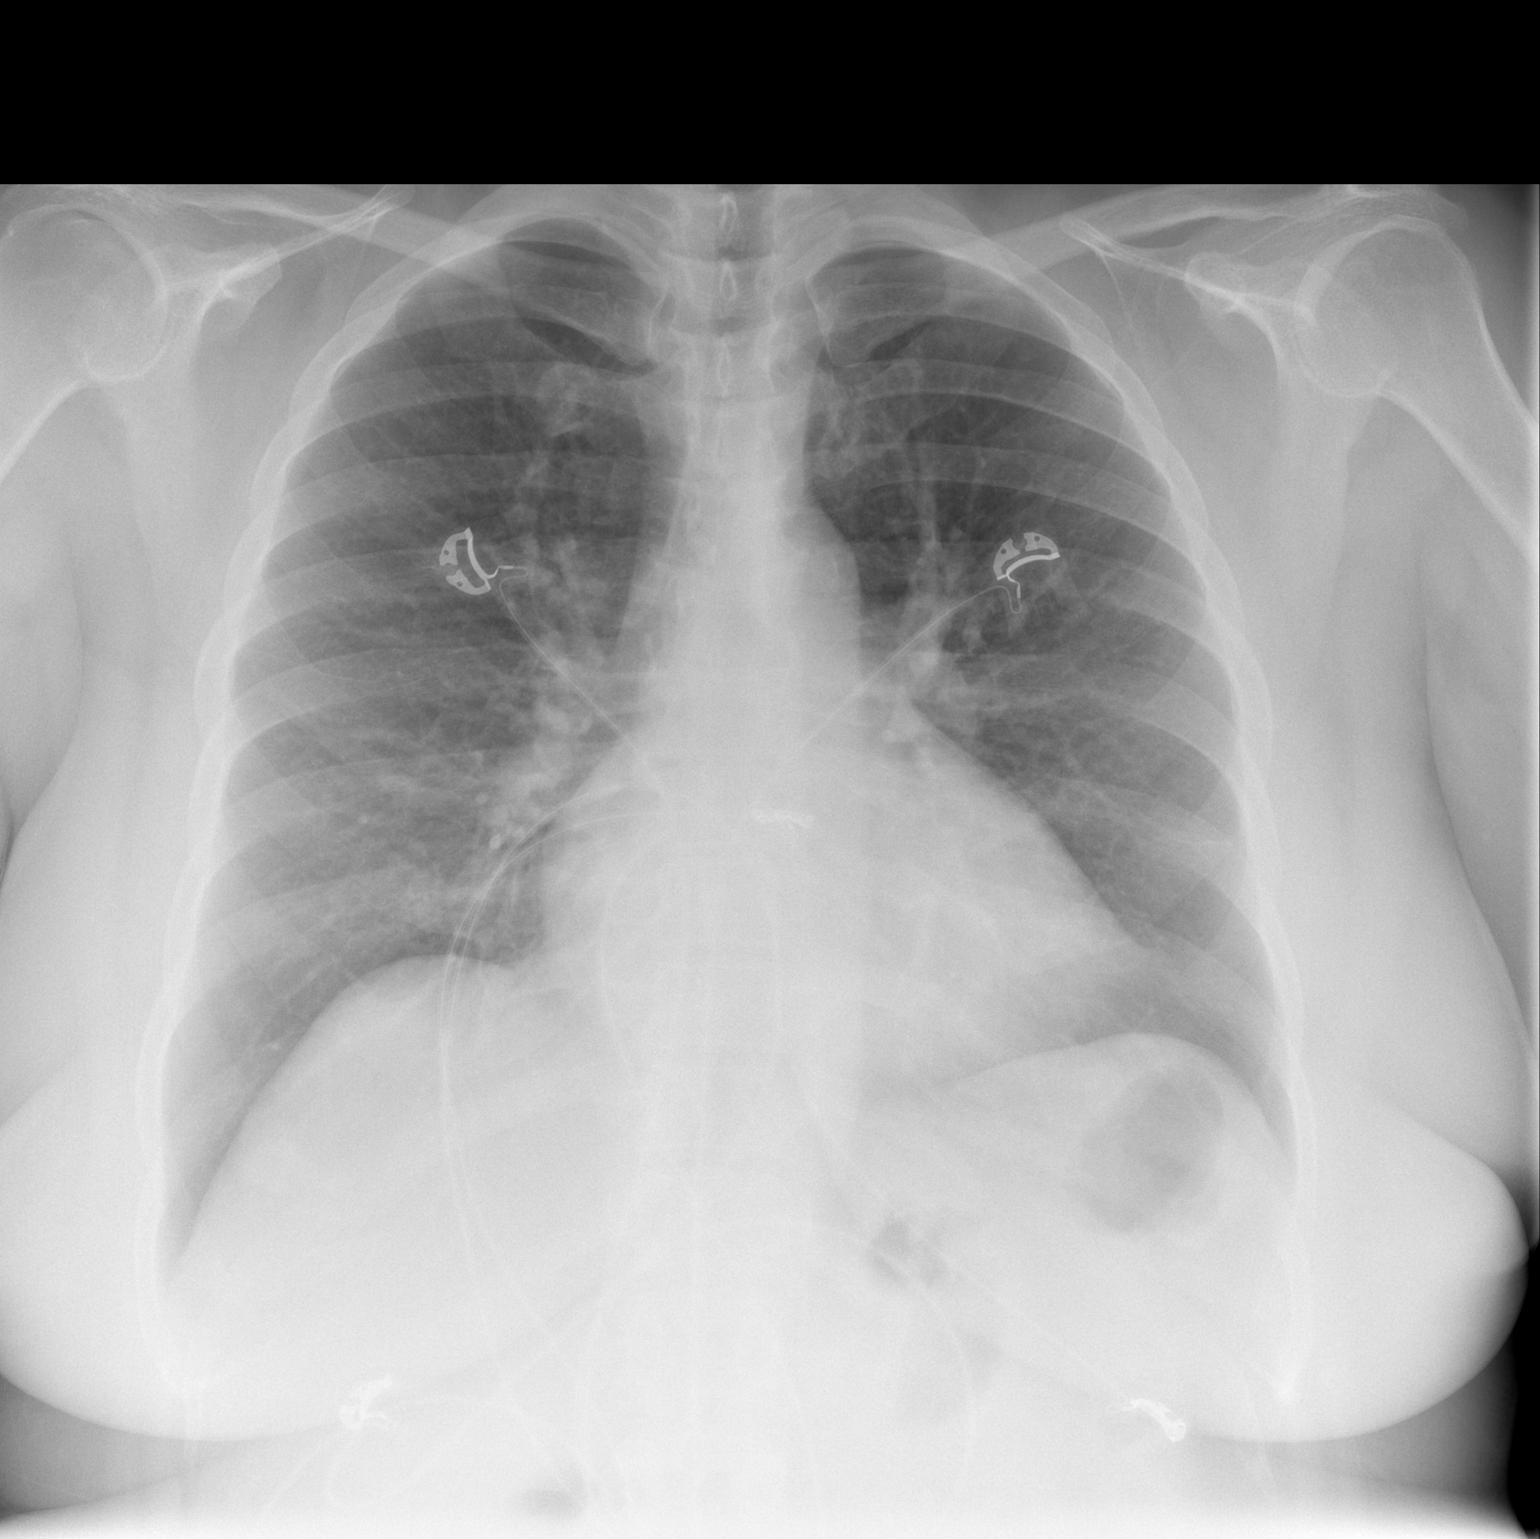

[w chest lat]
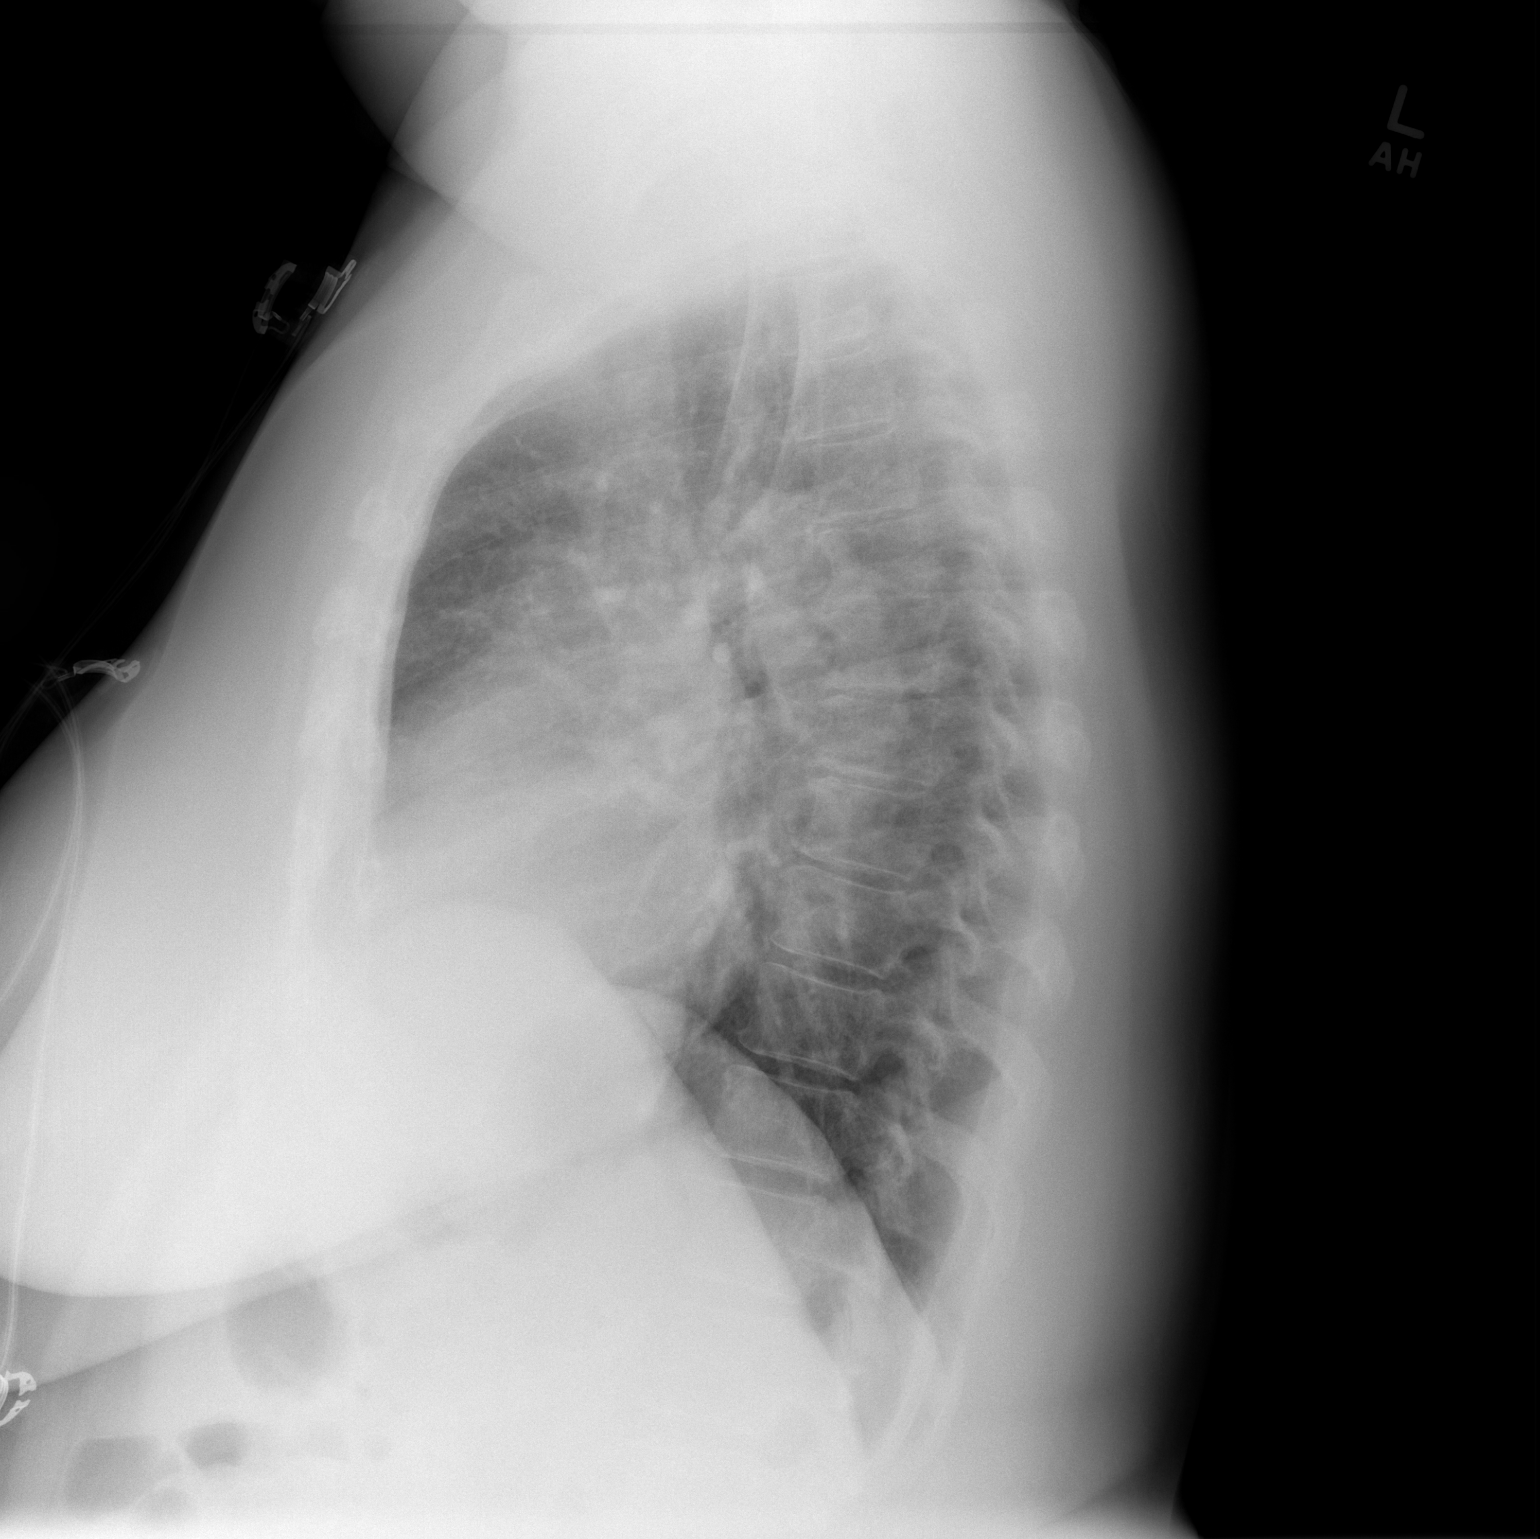

[2 of 2 positions shown; findings below may reference images not displayed]

FINDINGS: Borderline cardiomegaly.  Lungs are clear.  No pleural
effusion.  No pneumothorax.
IMPRESSION: No acute cardiopulmonary disease.

## 2013-10-22 DIAGNOSIS — W57XXXA Bitten or stung by nonvenomous insect and other nonvenomous arthropods, initial encounter: Secondary | ICD-10-CM | POA: Insufficient documentation

## 2013-11-07 ENCOUNTER — Emergency Department (HOSPITAL_BASED_OUTPATIENT_CLINIC_OR_DEPARTMENT_OTHER)
Admission: EM | Admit: 2013-11-07 | Discharge: 2013-11-07 | Discharge status: Against Medical Advice | Payer: BC Managed Care – PPO | Source: Home / Self Care

## 2013-11-07 ENCOUNTER — Encounter (HOSPITAL_BASED_OUTPATIENT_CLINIC_OR_DEPARTMENT_OTHER): Payer: Self-pay | Admitting: Emergency Medicine

## 2013-11-07 ENCOUNTER — Encounter (HOSPITAL_COMMUNITY): Payer: Self-pay | Admitting: Emergency Medicine

## 2013-11-07 DIAGNOSIS — L02219 Cutaneous abscess of trunk, unspecified: Secondary | ICD-10-CM

## 2013-11-07 DIAGNOSIS — I10 Essential (primary) hypertension: Secondary | ICD-10-CM | POA: Insufficient documentation

## 2013-11-07 DIAGNOSIS — Z90710 Acquired absence of both cervix and uterus: Secondary | ICD-10-CM | POA: Insufficient documentation

## 2013-11-07 DIAGNOSIS — R509 Fever, unspecified: Secondary | ICD-10-CM | POA: Insufficient documentation

## 2013-11-07 DIAGNOSIS — Z79899 Other long term (current) drug therapy: Secondary | ICD-10-CM | POA: Insufficient documentation

## 2013-11-07 DIAGNOSIS — R110 Nausea: Secondary | ICD-10-CM | POA: Diagnosis not present

## 2013-11-07 DIAGNOSIS — Z4800 Encounter for change or removal of nonsurgical wound dressing: Secondary | ICD-10-CM | POA: Diagnosis not present

## 2013-11-07 DIAGNOSIS — K219 Gastro-esophageal reflux disease without esophagitis: Secondary | ICD-10-CM | POA: Insufficient documentation

## 2013-11-07 DIAGNOSIS — L03319 Cellulitis of trunk, unspecified: Secondary | ICD-10-CM

## 2013-11-07 DIAGNOSIS — M129 Arthropathy, unspecified: Secondary | ICD-10-CM | POA: Diagnosis not present

## 2013-11-07 LAB — BASIC METABOLIC PANEL
Anion gap: 13 (ref 5–15)
BUN: 12 mg/dL (ref 6–23)
CHLORIDE: 101 meq/L (ref 96–112)
CO2: 24 meq/L (ref 19–32)
CREATININE: 0.66 mg/dL (ref 0.50–1.10)
Calcium: 9.1 mg/dL (ref 8.4–10.5)
GFR calc Af Amer: >90 mL/min (ref >90)
GFR calc non Af Amer: >90 mL/min (ref >90)
Glucose, Bld: 104 mg/dL — ABNORMAL HIGH (ref 70–99)
Potassium: 3.9 mEq/L (ref 3.7–5.3)
Sodium: 138 mEq/L (ref 137–147)

## 2013-11-07 LAB — CBC
HEMATOCRIT: 39.6 % (ref 36.0–46.0)
Hemoglobin: 12.5 g/dL (ref 12.0–15.0)
MCH: 27.4 pg (ref 26.0–34.0)
MCHC: 31.6 g/dL (ref 30.0–36.0)
MCV: 86.7 fL (ref 78.0–100.0)
Platelets: 276 10*3/uL (ref 150–400)
RBC: 4.57 MIL/uL (ref 3.87–5.11)
RDW: 14.2 % (ref 11.5–15.5)
WBC: 12.9 10*3/uL — AB (ref 4.0–10.5)

## 2013-11-07 NOTE — ED Notes (Signed)
Pt very pleasant but states can't wait any longer.  Works 3rd shift. LWBS after triage

## 2013-11-07 NOTE — ED Notes (Signed)
Pt reports being treated for a spider bite to lower abdomen 3 weeks ago at [facility] - pt prescribed Septra and Prednisone.  Pt has been having fever, chills, and weakness onset yesterday.  Pt reports taking not taking 2 pills of Septra but finishing everything else.

## 2013-11-07 NOTE — ED Notes (Signed)
Pt c/o abscess x 2 areas  to abd x 2 weeks ? Insect bite

## 2013-11-08 ENCOUNTER — Emergency Department (HOSPITAL_COMMUNITY)
Admission: EM | Admit: 2013-11-08 | Discharge: 2013-11-08 | Disposition: A | Discharge status: Home / Self Care | Payer: BC Managed Care – PPO | Attending: Emergency Medicine | Admitting: Emergency Medicine

## 2013-11-08 ENCOUNTER — Emergency Department (HOSPITAL_COMMUNITY): Payer: BC Managed Care – PPO

## 2013-11-08 ENCOUNTER — Encounter (HOSPITAL_COMMUNITY): Payer: Self-pay

## 2013-11-08 DIAGNOSIS — S30861D Insect bite (nonvenomous) of abdominal wall, subsequent encounter: Secondary | ICD-10-CM

## 2013-11-08 DIAGNOSIS — W57XXXD Bitten or stung by nonvenomous insect and other nonvenomous arthropods, subsequent encounter: Secondary | ICD-10-CM

## 2013-11-08 MED ORDER — IOHEXOL 300 MG/ML  SOLN
100.0000 mL | Freq: Once | INTRAMUSCULAR | Status: AC | PRN
  Administered 2013-11-08: 100 mL via INTRAVENOUS

## 2013-11-08 MED ORDER — CLINDAMYCIN HCL 300 MG PO CAPS
300.0000 mg | ORAL_CAPSULE | Freq: Once | ORAL | Status: AC
  Administered 2013-11-08: 300 mg via ORAL
  Filled 2013-11-08: qty 1

## 2013-11-08 MED ORDER — ONDANSETRON 4 MG PO TBDP: ORAL_TABLET | ORAL | Status: DC

## 2013-11-08 MED ORDER — CLINDAMYCIN HCL 300 MG PO CAPS
300.0000 mg | ORAL_CAPSULE | Freq: Four times a day (QID) | ORAL | Status: DC
Start: 2013-11-08 — End: 2013-11-22

## 2013-11-08 NOTE — ED Provider Notes (Signed)
Medical screening examination/treatment/procedure(s) were performed by non-physician practitioner and as supervising physician I was immediately available for consultation/collaboration.   EKG Interpretation None       Rebie Peale, MD 11/08/13 0155 

## 2013-11-08 NOTE — ED Notes (Signed)
Pt back from CT

## 2013-11-08 NOTE — ED Notes (Signed)
CT notified of pt finishing up CT contrast. 

## 2013-11-08 NOTE — ED Notes (Signed)
Pt ambulated to restroom. 

## 2013-11-08 NOTE — ED Provider Notes (Signed)
CSN: [id number]     Arrival date & time 11/07/13  2304 History   First MD Initiated Contact with Patient 11/08/13 0004     Chief Complaint  Patient presents with  . Insect Bite     (Consider location/radiation/quality/duration/timing/severity/associated sxs/prior Treatment) HPI Pt is a 48yo morbidly obese female presenting to ED with c/o abscess on her abdomen x2-3 weeks.  Reports being evaluated at [facility] and prescribed septra and prednisone, states she has completed all of her antibiotics except for 2 pills of Septra because she thought the medicine was causing muscle cramps.  States she thought she was doing better until she started having subjective fever, chills and weakness that started yesterday.  Pt also c/o RLQ pain that is constant, aching and sore, 8/10 at worse. Nothing makes better or worse. Denies urinary or vaginal symptoms. Does report having a nurse at her work clean and remove a scab on on of the abscess on her abdomen and noticed yellow discharge.  The nurse advised her to have the wound evaluated further.     Past Medical History  Diagnosis Date  . Hypertension   . Sleep apnea     does not wear CPAP  . GERD (gastroesophageal reflux disease)   . Arthritis   . Morbid obesity   . Hiatal hernia    Past Surgical History  Procedure Laterality Date  . Abdominal hysterectomy    . Cardiovascular stress test  07/25/2007    Negative stress echo; ef 55-60%  . Knee arthroscopy  01/2011    left  . Esophageal manometry  03/07/2012    Procedure: ESOPHAGEAL MANOMETRY (EM);  Surgeon: Mardella Layman, MD;  Location: [facility];  Service: Endoscopy;  Laterality: N/A;  . Knee surgery     Family History  Problem Relation Age of Onset  . Cervical cancer Mother   . Hypertension Sister   . Diabetes Sister   . Heart disease Neg Hx   . Cancer Neg Hx   . Alcohol abuse Neg Hx   . Early death Neg Hx   . Hearing loss Neg Hx   . Hyperlipidemia Neg Hx   . Kidney disease Neg Hx     . Stroke Neg Hx    History  Substance Use Topics  . Smoking status: Never Smoker   . Smokeless tobacco: Never Used  . Alcohol Use: No   OB History   Grav Para Term Preterm Abortions TAB SAB Ect Mult Living                 Review of Systems  Constitutional: Positive for fever ( subjective ) and chills.  Gastrointestinal: Positive for nausea and abdominal pain. Negative for vomiting and diarrhea.  All other systems reviewed and are negative.     Allergies  Oxycodone  Home Medications   Prior to Admission medications   Medication Sig Start Date End Date Taking? Authorizing Provider  acetaminophen (TYLENOL) 500 MG tablet Take 1,000 mg by mouth every 6 (six) hours as needed. For pain    Historical Provider, MD  Calcium Carbonate-Vitamin D (CALTRATE 600+D) 600-400 MG-UNIT per chew tablet Chew 1 tablet by mouth 2 (two) times daily.     Historical Provider, MD  hydrochlorothiazide (MICROZIDE) 12.5 MG capsule Take 1 capsule (12.5 mg total) by mouth daily. 05/25/13   Pecola Lawless, MD  ibuprofen (ADVIL,MOTRIN) 600 MG tablet Take 600 mg by mouth every 6 (six) hours as needed for pain.    Historical Provider,  MD  methocarbamol (ROBAXIN) 500 MG tablet Take 1 tablet (500 mg total) by mouth 4 (four) times daily. 01/26/13   Corwin Levins, MD  metoprolol succinate (TOPROL-XL) 50 MG 24 hr tablet Take 1 tablet (50 mg total) by mouth daily. 05/05/13   Vesta Mixer, MD  ondansetron (ZOFRAN) 4 MG tablet Take 1 tablet (4 mg total) by mouth every 6 (six) hours. 08/15/13   Junius Finner, PA-C  pantoprazole (PROTONIX) 40 MG tablet Take 1 tablet (40 mg total) by mouth 2 (two) times daily before a meal. 30 mins before breakfast/supper 06/28/13 10/19/14  Iva Boop, MD  traMADol (ULTRAM) 50 MG tablet Take 1 tablet (50 mg total) by mouth every 6 (six) hours as needed. 06/28/13   [name], MD   BP 160/95  Pulse 82  Temp(Src) 99 F (37.2 C) (Oral)  Resp 16  Ht  (1.651 m)  Wt 310 lb  (140.615 kg)  BMI 51.59 kg/m2  SpO2 95% Physical Exam  Nursing note and vitals reviewed. Constitutional: She appears well-developed and well-nourished. No distress.  Morbidly obese female lying comfortably on exam bed, NAD.   HENT:  Head: Normocephalic and atraumatic.  Eyes: Conjunctivae are normal. No scleral icterus.  Neck: Normal range of motion.  Cardiovascular: Normal rate, regular rhythm and normal heart sounds.   Pulmonary/Chest: Effort normal and breath sounds normal. No respiratory distress. She has no wheezes. She has no rales. She exhibits no tenderness.  Abdominal: Soft. Bowel sounds are normal. She exhibits no distension and no mass. There is tenderness ( RLQ). There is no rebound and no guarding.    Obese abdomen, soft, tenderness in RLQ. No rebound or guarding.   1cm erythematous well healing abscess in lower center of abdomen with thin clear layer of overlying skin, non-tender. No induration or fluctuance.   2cm well healing ulceration to left of initial abscess, granulation tissue present with surrounding erythematous borders.  Mild tenderness. No fluctuance or induration.   Musculoskeletal: Normal range of motion.  Neurological: She is alert.  Skin: Skin is warm and dry. She is not diaphoretic.    ED Course  Procedures (including critical care time) Labs Review Labs Reviewed  CBC - Abnormal; Notable for the following:    WBC 12.9 (*)    All other components within normal limits  BASIC METABOLIC PANEL - Abnormal; Notable for the following:    Glucose, Bld 104 (*)    All other components within normal limits    Imaging Review No results found.   EKG Interpretation None      MDM   Final diagnoses:  None    Pt is a 48yo morbidly obese female presenting to ED with c/o RLQ abdominal pain as well as nausea, subjective fever and chills that started yesterday.  Pt was tx with Septra and prednisone 3 weeks ago for presumed spider bites on abdomen.  Abscesses  appear to be healing well with granulation tissue present.   Will tx with keflex as minimal erythema still present. No induration, fluctuance or tenderness or skin lesions.  Pt does have RLQ tenderness with no overlying skin changes and no rebound, guarding or masses. No obvious source of pt's RLQ pain and tenderness. Due to tenderness on exam as well as reported subjective fever, temp of 99 in ED, will get CT abd to r/o appendicitis.  Pt has declined pain and nausea medication while in ED.   1:07 AM Pt signed out to Dr. Ranae Palms  at shift change. Plan is to f/u on CT scan of abdomen.  If unremarkable, pt may be discharged home with tx symptomatically.   Junius Finner, PA-C 11/08/13 0109

## 2013-11-08 NOTE — ED Notes (Signed)
Pt reports RLQ pain since Monday. Also has two bug bites to central abdomen, one is pink with granulation tissue surrounding. One is scabbed. 8/10 pain to RLQ

## 2013-11-08 NOTE — Discharge Instructions (Signed)
Call and make an appointment to follow up with your doctor to re-evaluate your wounds. Return immediately for worsening redness, fever, pain, vomiting or any concerns   Insect Bite Mosquitoes, flies, fleas, bedbugs, and many other insects can bite. Insect bites are different from insect stings. A sting is when venom is injected into the skin. Some insect bites can transmit infectious diseases. SYMPTOMS  Insect bites usually turn red, swell, and itch for 2 to 4 days. They often go away on their own. TREATMENT  Your caregiver may prescribe antibiotic medicines if a bacterial infection develops in the bite. HOME CARE INSTRUCTIONS  Do not scratch the bite area.  Keep the bite area clean and dry. Wash the bite area thoroughly with soap and water.  Put ice or cool compresses on the bite area.  Put ice in a plastic bag.  Place a towel between your skin and the bag.  Leave the ice on for 20 minutes, 4 times a day for the first 2 to 3 days, or as directed.  You may apply a baking soda paste, cortisone cream, or calamine lotion to the bite area as directed by your caregiver. This can help reduce itching and swelling.  Only take over-the-counter or prescription medicines as directed by your caregiver.  If you are given antibiotics, take them as directed. Finish them even if you start to feel better. You may need a tetanus shot if:  You cannot remember when you had your last tetanus shot.  You have never had a tetanus shot.  The injury broke your skin. If you get a tetanus shot, your arm may swell, get red, and feel warm to the touch. This is common and not a problem. If you need a tetanus shot and you choose not to have one, there is a rare chance of getting tetanus. Sickness from tetanus can be serious. SEEK IMMEDIATE MEDICAL CARE IF:   You have increased pain, redness, or swelling in the bite area.  You see a red line on the skin coming from the bite.  You have a fever.  You have  joint pain.  You have a headache or neck pain.  You have unusual weakness.  You have a rash.  You have chest pain or shortness of breath.  You have abdominal pain, nausea, or vomiting.  You feel unusually tired or sleepy. MAKE SURE YOU:   Understand these instructions.  Will watch your condition.  Will get help right away if you are not doing well or get worse. Document Released: 04/02/2004 Document Revised: 05/18/2011 Document Reviewed: 09/24/2010 [facility] Patient Information 2015 [place], [place]. This information is not intended to replace advice given to you by your health care provider. Make sure you discuss any questions you have with your health care provider.

## 2013-11-15 ENCOUNTER — Institutional Professional Consult (permissible substitution): Payer: BC Managed Care – PPO | Admitting: Pulmonary Disease

## 2013-11-16 ENCOUNTER — Other Ambulatory Visit: Payer: Self-pay | Admitting: Internal Medicine

## 2013-11-22 ENCOUNTER — Ambulatory Visit (INDEPENDENT_AMBULATORY_CARE_PROVIDER_SITE_OTHER): Payer: BC Managed Care – PPO | Admitting: Pulmonary Disease

## 2013-11-22 ENCOUNTER — Encounter: Payer: Self-pay | Admitting: Pulmonary Disease

## 2013-11-22 VITALS — BP 122/84 | HR 75 | Ht 65.0 in | Wt 314.8 lb

## 2013-11-22 DIAGNOSIS — G4733 Obstructive sleep apnea (adult) (pediatric): Secondary | ICD-10-CM

## 2013-11-22 NOTE — Progress Notes (Deleted)
   Subjective:    Patient ID: [name], female    DOB: [birth year], 48 y.o.   MRN: [id number]  HPI    Review of Systems  Constitutional: Negative for fever and unexpected weight change.  HENT: Negative for congestion, dental problem, ear pain, nosebleeds, postnasal drip, rhinorrhea, sinus pressure, sneezing, sore throat and trouble swallowing.   Eyes: Negative for redness and itching.  Respiratory: Negative for cough, chest tightness, shortness of breath and wheezing.   Cardiovascular: Negative for palpitations and leg swelling.  Gastrointestinal: Negative for nausea and vomiting.  Genitourinary: Negative for dysuria.  Musculoskeletal: Negative for joint swelling.  Skin: Negative for rash.  Neurological: Positive for headaches.  Hematological: Does not bruise/bleed easily.  Psychiatric/Behavioral: Negative for dysphoric mood. The patient is not nervous/anxious.        Objective:   Physical Exam        Assessment & Plan:

## 2013-11-22 NOTE — Patient Instructions (Signed)
Will arrange for home sleep study Will call to arrange for follow up after sleep study reviewed  

## 2013-11-22 NOTE — Assessment & Plan Note (Signed)
She has snoring, sleep disruption, witnessed apnea, and daytime sleepiness.  She has BMI > 35.  She has prior history of sleep apnea and has history of HTN.  I suspect she still has sleep apnea.  We discussed how sleep apnea can affect various health problems including risks for hypertension, cardiovascular disease, and diabetes.  We also discussed how sleep disruption can increase risks for accident, such as while driving.  Weight loss as a means of improving sleep apnea was also reviewed.  Additional treatment options discussed were CPAP therapy, oral appliance, and surgical intervention.  To further assess will arrange for home sleep study pending insurance approval.

## 2013-11-22 NOTE — Progress Notes (Signed)
Chief Complaint  Patient presents with  . SLEEP CONSULT    Referred by Dr Alwyn Ren. Sleep Study 10+years ago. Epworth Score: 10    History of Present Illness: [name] is a 48 y.o. female for evaluation of sleep problems.  She had a sleep study about 10 years ago, and was found to have sleep apnea.  She was previously on CPAP and this helped.  She moved, and during the move lost her CPAP machine.  She has noticed her sleep has gotten worse, and decide to pursue issues with her sleep apnea more.  She snores, and will wake up feeling like she can't breath.  She has been told that she stops breathing while asleep.  She has trouble sleeping on her back, and her mouth gets dry at night.  She works third shift.  She goes to sleep at 9 am.  She falls asleep quickly, but then wakes up frequently because she can't breath.  She wakes up sometimes to use the bathroom also.  She gets out of bed at 1 pm.  She will then go back to sleep between 8 pm and 10 pm.  She feels tired during her work shift.  She denies morning headache.  She does not use anything to help her fall sleep or stay awake.  She denies sleep walking, sleep talking, bruxism, or nightmares.  There is no history of restless legs.  She denies sleep hallucinations, sleep paralysis, or cataplexy.  The Epworth score is 14 out of 24.  [name]  has a past medical history of Hypertension; Sleep apnea; GERD (gastroesophageal reflux disease); Arthritis; Morbid obesity; Hiatal hernia; and Migraines.  [name]  has past surgical history that includes Abdominal hysterectomy; Cardiovascular stress test (07/25/2007); Knee arthroscopy (01/2011); Esophageal manometry (03/07/2012); Knee surgery; and Abdominal hysterectomy (2003).  Prior to Admission medications   Medication Sig Start Date End Date Taking? Authorizing Provider  acetaminophen (TYLENOL) 500 MG tablet Take 1,000 mg by mouth every 6 (six) hours as needed. For pain    Yes Historical Provider, MD  Calcium Carbonate-Vitamin D (CALTRATE 600+D) 600-400 MG-UNIT per chew tablet Chew 1 tablet by mouth as needed.    Yes Historical Provider, MD  ibuprofen (ADVIL,MOTRIN) 600 MG tablet Take 600 mg by mouth every 6 (six) hours as needed for pain.   Yes Historical Provider, MD  metoprolol succinate (TOPROL-XL) 50 MG 24 hr tablet Take 1 tablet (50 mg total) by mouth daily. 05/05/13  Yes Vesta Mixer, MD  ondansetron (ZOFRAN ODT) 4 MG disintegrating tablet  ODT q4 hours prn nausea/vomit 11/08/13  Yes Loren Racer, MD  pantoprazole (PROTONIX) 40 MG tablet Take 1 tablet (40 mg total) by mouth 2 (two) times daily before a meal. 30 mins before breakfast/supper 06/28/13 10/19/14 Yes Iva Boop, MD    Allergies  Allergen Reactions  . Oxycodone Shortness Of Breath    Her family history includes Cervical cancer in her mother; Diabetes in her sister; Hypertension in her sister. There is no history of Heart disease, Cancer, Alcohol abuse, Early death, Hearing loss, Hyperlipidemia, Kidney disease, or Stroke.  She  reports that she has never smoked. She has never used smokeless tobacco. She reports that she does not drink alcohol or use illicit drugs.  Review of Systems  Constitutional: Negative for fever and unexpected weight change.  HENT: Negative for congestion, dental problem, ear pain, nosebleeds, postnasal drip, rhinorrhea, sinus pressure, sneezing, sore throat and trouble swallowing.   Eyes: Negative for redness  and itching.  Respiratory: Negative for cough, chest tightness, shortness of breath and wheezing.   Cardiovascular: Negative for palpitations and leg swelling.  Gastrointestinal: Negative for nausea and vomiting.  Genitourinary: Negative for dysuria.  Musculoskeletal: Negative for joint swelling.  Skin: Negative for rash.  Neurological: Positive for headaches.  Hematological: Does not bruise/bleed easily.  Psychiatric/Behavioral: Negative for dysphoric  mood. The patient is not nervous/anxious.    Physical Exam:  General - No distress ENT - No sinus tenderness, no oral exudate, no LAN, no thyromegaly, TM clear, pupils equal/reactive, MP 3, elongated uvula, enlarged tongue, 2+ tonsils Cardiac - s1s2 regular, no murmur, pulses symmetric Chest - No wheeze/rales/dullness, good air entry, normal respiratory excursion Back - No focal tenderness Abd - Soft, non-tender, no organomegaly, + bowel sounds Ext - No edema Neuro - Normal strength, cranial nerves intact Skin - No rashes Psych - Normal mood, and behavior  Assessment/plan:  Coralyn Helling, M.D. Pager [phone]

## 2013-11-28 ENCOUNTER — Other Ambulatory Visit: Payer: Self-pay | Admitting: Internal Medicine

## 2013-11-29 LAB — HM MAMMOGRAPHY: HM MAMMO: NORMAL

## 2013-12-19 ENCOUNTER — Other Ambulatory Visit: Payer: Self-pay

## 2013-12-19 DIAGNOSIS — Z1231 Encounter for screening mammogram for malignant neoplasm of breast: Secondary | ICD-10-CM

## 2013-12-22 ENCOUNTER — Other Ambulatory Visit: Payer: Self-pay

## 2013-12-27 ENCOUNTER — Telehealth: Payer: Self-pay | Admitting: Pulmonary Disease

## 2013-12-27 NOTE — Telephone Encounter (Signed)
Spoke with pt - she would like to know status of home sleep study.  PCCs, pls advise.  Thank you.

## 2013-12-28 NOTE — Telephone Encounter (Signed)
Called and left msg for patient to set up time for her to come pick up sleep machine. Kandice Hams.Marqus Macphee

## 2014-01-01 DIAGNOSIS — G4730 Sleep apnea, unspecified: Secondary | ICD-10-CM

## 2014-01-03 ENCOUNTER — Ambulatory Visit
Admission: RE | Admit: 2014-01-03 | Discharge: 2014-01-03 | Disposition: A | Discharge status: Home / Self Care | Payer: BC Managed Care – PPO | Source: Ambulatory Visit

## 2014-01-03 DIAGNOSIS — Z1231 Encounter for screening mammogram for malignant neoplasm of breast: Secondary | ICD-10-CM

## 2014-01-04 ENCOUNTER — Telehealth: Payer: Self-pay | Admitting: Pulmonary Disease

## 2014-01-04 DIAGNOSIS — G4730 Sleep apnea, unspecified: Secondary | ICD-10-CM

## 2014-01-04 NOTE — Telephone Encounter (Signed)
HST 01/01/14 >> AHI 6.8, SaO2 low 72%  Will have my nurse inform pt that sleep study shows mild sleep apnea.  She will need ROV to discuss treatment options in more detail >> can schedule ROV with Tammy Parrett if I do not have anything available soon.

## 2014-01-05 ENCOUNTER — Encounter: Payer: Self-pay | Admitting: Pulmonary Disease

## 2014-01-11 NOTE — Telephone Encounter (Signed)
Patient returning call.  [phone]

## 2014-01-11 NOTE — Telephone Encounter (Signed)
Results have been explained to patient, pt expressed understanding. Pt scheduled for OV with VS 01/22/14 at 2pm.  Nothing further needed.

## 2014-01-11 NOTE — Telephone Encounter (Signed)
LMTC x 1  

## 2014-01-22 ENCOUNTER — Ambulatory Visit: Payer: BC Managed Care – PPO | Admitting: Pulmonary Disease

## 2014-03-14 ENCOUNTER — Other Ambulatory Visit (INDEPENDENT_AMBULATORY_CARE_PROVIDER_SITE_OTHER): Payer: BLUE CROSS/BLUE SHIELD

## 2014-03-14 ENCOUNTER — Ambulatory Visit (INDEPENDENT_AMBULATORY_CARE_PROVIDER_SITE_OTHER): Payer: BLUE CROSS/BLUE SHIELD | Admitting: Internal Medicine

## 2014-03-14 VITALS — BP 140/90 | HR 69 | Temp 98.7°F | Resp 16 | Ht 65.0 in | Wt 309.0 lb

## 2014-03-14 DIAGNOSIS — D72829 Elevated white blood cell count, unspecified: Secondary | ICD-10-CM

## 2014-03-14 DIAGNOSIS — Z0000 Encounter for general adult medical examination without abnormal findings: Secondary | ICD-10-CM

## 2014-03-14 DIAGNOSIS — K219 Gastro-esophageal reflux disease without esophagitis: Secondary | ICD-10-CM

## 2014-03-14 DIAGNOSIS — I10 Essential (primary) hypertension: Secondary | ICD-10-CM

## 2014-03-14 LAB — LIPID PANEL
CHOL/HDL RATIO: 4
Cholesterol: 205 mg/dL — ABNORMAL HIGH (ref 0–200)
HDL: 47.3 mg/dL (ref >39.00)
LDL Cholesterol: 136 mg/dL — ABNORMAL HIGH (ref 0–99)
NonHDL: 157.7
TRIGLYCERIDES: 108 mg/dL (ref 0.0–149.0)
VLDL: 21.6 mg/dL (ref 0.0–40.0)

## 2014-03-14 LAB — COMPREHENSIVE METABOLIC PANEL
ALBUMIN: 3.7 g/dL (ref 3.5–5.2)
ALT: 13 U/L (ref 0–35)
AST: 18 U/L (ref 0–37)
Alkaline Phosphatase: 91 U/L (ref 39–117)
BUN: 13 mg/dL (ref 6–23)
CHLORIDE: 106 meq/L (ref 96–112)
CO2: 26 mEq/L (ref 19–32)
Calcium: 9 mg/dL (ref 8.4–10.5)
Creatinine, Ser: 0.7 mg/dL (ref 0.4–1.2)
GFR: 115.98 mL/min (ref >60.00)
Glucose, Bld: 87 mg/dL (ref 70–99)
Potassium: 4.1 mEq/L (ref 3.5–5.1)
SODIUM: 140 meq/L (ref 135–145)
Total Bilirubin: 0.5 mg/dL (ref 0.2–1.2)
Total Protein: 7.7 g/dL (ref 6.0–8.3)

## 2014-03-14 LAB — CBC WITH DIFFERENTIAL/PLATELET
BASOS ABS: 0.1 10*3/uL (ref 0.0–0.1)
Basophils Relative: 0.7 % (ref 0.0–3.0)
Eosinophils Absolute: 0.4 10*3/uL (ref 0.0–0.7)
Eosinophils Relative: 3.2 % (ref 0.0–5.0)
HCT: 39.7 % (ref 36.0–46.0)
HEMOGLOBIN: 12.7 g/dL (ref 12.0–15.0)
Lymphocytes Relative: 31 % (ref 12.0–46.0)
Lymphs Abs: 4 10*3/uL (ref 0.7–4.0)
MCHC: 31.9 g/dL (ref 30.0–36.0)
MCV: 84.3 fl (ref 78.0–100.0)
MONO ABS: 1.2 10*3/uL — AB (ref 0.1–1.0)
Monocytes Relative: 9.5 % (ref 3.0–12.0)
NEUTROS PCT: 55.6 % (ref 43.0–77.0)
Neutro Abs: 7.1 10*3/uL (ref 1.4–7.7)
Platelets: 280 10*3/uL (ref 150.0–400.0)
RBC: 4.71 Mil/uL (ref 3.87–5.11)
RDW: 14.5 % (ref 11.5–15.5)
WBC: 12.8 10*3/uL — ABNORMAL HIGH (ref 4.0–10.5)

## 2014-03-14 LAB — TSH: TSH: 1.71 u[IU]/mL (ref 0.35–4.50)

## 2014-03-14 MED ORDER — METOPROLOL SUCCINATE ER 50 MG PO TB24: 50.0000 mg | ORAL_TABLET | Freq: Every day | ORAL | Status: DC

## 2014-03-14 MED ORDER — PANTOPRAZOLE SODIUM 40 MG PO TBEC: 40.0000 mg | DELAYED_RELEASE_TABLET | Freq: Two times a day (BID) | ORAL | Status: DC

## 2014-03-14 NOTE — Patient Instructions (Signed)
Preventive Care for Adults A healthy lifestyle and preventive care can promote health and wellness. Preventive health guidelines for women include the following key practices.  A routine yearly physical is a good way to check with your health care provider about your health and preventive screening. It is a chance to share any concerns and updates on your health and to receive a thorough exam.  Visit your dentist for a routine exam and preventive care every 6 months. Brush your teeth twice a day and floss once a day. Good oral hygiene prevents tooth decay and gum disease.  The frequency of eye exams is based on your age, health, family medical history, use of contact lenses, and other factors. Follow your health care provider's recommendations for frequency of eye exams.  Eat a healthy diet. Foods like vegetables, fruits, whole grains, low-fat dairy products, and lean protein foods contain the nutrients you need without too many calories. Decrease your intake of foods high in solid fats, added sugars, and salt. Eat the right amount of calories for you.Get information about a proper diet from your health care provider, if necessary.  Regular physical exercise is one of the most important things you can do for your health. Most adults should get at least 150 minutes of moderate-intensity exercise (any activity that increases your heart rate and causes you to sweat) each week. In addition, most adults need muscle-strengthening exercises on 2 or more days a week.  Maintain a healthy weight. The body mass index (BMI) is a screening tool to identify possible weight problems. It provides an estimate of body fat based on height and weight. Your health care provider can find your BMI and can help you achieve or maintain a healthy weight.For adults 20 years and older:  A BMI below 18.5 is considered underweight.  A BMI of 18.5 to 24.9 is normal.  A BMI of 25 to 29.9 is considered overweight.  A BMI of  30 and above is considered obese.  Maintain normal blood lipids and cholesterol levels by exercising and minimizing your intake of saturated fat. Eat a balanced diet with plenty of fruit and vegetables. Blood tests for lipids and cholesterol should begin at age 76 and be repeated every 5 years. If your lipid or cholesterol levels are high, you are over 50, or you are at high risk for heart disease, you may need your cholesterol levels checked more frequently.Ongoing high lipid and cholesterol levels should be treated with medicines if diet and exercise are not working.  If you smoke, find out from your health care provider how to quit. If you do not use tobacco, do not start.  Lung cancer screening is recommended for adults aged 22-80 years who are at high risk for developing lung cancer because of a history of smoking. A yearly low-dose CT scan of the lungs is recommended for people who have at least a 30-pack-year history of smoking and are a current smoker or have quit within the past 15 years. A pack year of smoking is smoking an average of 1 pack of cigarettes a day for 1 year (for example: 1 pack a day for 30 years or 2 packs a day for 15 years). Yearly screening should continue until the smoker has stopped smoking for at least 15 years. Yearly screening should be stopped for people who develop a health problem that would prevent them from having lung cancer treatment.  If you are pregnant, do not drink alcohol. If you are breastfeeding,  be very cautious about drinking alcohol. If you are not pregnant and choose to drink alcohol, do not have more than 1 drink per day. One drink is considered to be 12 ounces (355 mL) of beer, 5 ounces (148 mL) of wine, or 1.5 ounces (44 mL) of liquor.  Avoid use of street drugs. Do not share needles with anyone. Ask for help if you need support or instructions about stopping the use of drugs.  High blood pressure causes heart disease and increases the risk of  stroke. Your blood pressure should be checked at least every 1 to 2 years. Ongoing high blood pressure should be treated with medicines if weight loss and exercise do not work.  If you are 3-86 years old, ask your health care provider if you should take aspirin to prevent strokes.  Diabetes screening involves taking a blood sample to check your fasting blood sugar level. This should be done once every 3 years, after age 67, if you are within normal weight and without risk factors for diabetes. Testing should be considered at a younger age or be carried out more frequently if you are overweight and have at least 1 risk factor for diabetes.  Breast cancer screening is essential preventive care for women. You should practice "breast self-awareness." This means understanding the normal appearance and feel of your breasts and may include breast self-examination. Any changes detected, no matter how small, should be reported to a health care provider. Women in their 8s and 30s should have a clinical breast exam (CBE) by a health care provider as part of a regular health exam every 1 to 3 years. After age 70, women should have a CBE every year. Starting at age 25, women should consider having a mammogram (breast X-ray test) every year. Women who have a family history of breast cancer should talk to their health care provider about genetic screening. Women at a high risk of breast cancer should talk to their health care providers about having an MRI and a mammogram every year.  Breast cancer gene (BRCA)-related cancer risk assessment is recommended for women who have family members with BRCA-related cancers. BRCA-related cancers include breast, ovarian, tubal, and peritoneal cancers. Having family members with these cancers may be associated with an increased risk for harmful changes (mutations) in the breast cancer genes BRCA1 and BRCA2. Results of the assessment will determine the need for genetic counseling and  BRCA1 and BRCA2 testing.  Routine pelvic exams to screen for cancer are no longer recommended for nonpregnant women who are considered low risk for cancer of the pelvic organs (ovaries, uterus, and vagina) and who do not have symptoms. Ask your health care provider if a screening pelvic exam is right for you.  If you have had past treatment for cervical cancer or a condition that could lead to cancer, you need Pap tests and screening for cancer for at least 20 years after your treatment. If Pap tests have been discontinued, your risk factors (such as having a new sexual partner) need to be reassessed to determine if screening should be resumed. Some women have medical problems that increase the chance of getting cervical cancer. In these cases, your health care provider may recommend more frequent screening and Pap tests.  The HPV test is an additional test that may be used for cervical cancer screening. The HPV test looks for the virus that can cause the cell changes on the cervix. The cells collected during the Pap test can be  tested for HPV. The HPV test could be used to screen women aged 30 years and older, and should be used in women of any age who have unclear Pap test results. After the age of 30, women should have HPV testing at the same frequency as a Pap test.  Colorectal cancer can be detected and often prevented. Most routine colorectal cancer screening begins at the age of 50 years and continues through age 75 years. However, your health care provider may recommend screening at an earlier age if you have risk factors for colon cancer. On a yearly basis, your health care provider may provide home test kits to check for hidden blood in the stool. Use of a small camera at the end of a tube, to directly examine the colon (sigmoidoscopy or colonoscopy), can detect the earliest forms of colorectal cancer. Talk to your health care provider about this at age 50, when routine screening begins. Direct  exam of the colon should be repeated every 5-10 years through age 75 years, unless early forms of pre-cancerous polyps or small growths are found.  People who are at an increased risk for hepatitis B should be screened for this virus. You are considered at high risk for hepatitis B if:  You were born in a country where hepatitis B occurs often. Talk with your health care provider about which countries are considered high risk.  Your parents were born in a high-risk country and you have not received a shot to protect against hepatitis B (hepatitis B vaccine).  You have HIV or AIDS.  You use needles to inject street drugs.  You live with, or have sex with, someone who has hepatitis B.  You get hemodialysis treatment.  You take certain medicines for conditions like cancer, organ transplantation, and autoimmune conditions.  Hepatitis C blood testing is recommended for all people born from 1945 through 1965 and any individual with known risks for hepatitis C.  Practice safe sex. Use condoms and avoid high-risk sexual practices to reduce the spread of sexually transmitted infections (STIs). STIs include gonorrhea, chlamydia, syphilis, trichomonas, herpes, HPV, and human immunodeficiency virus (HIV). Herpes, HIV, and HPV are viral illnesses that have no cure. They can result in disability, cancer, and death.  You should be screened for sexually transmitted illnesses (STIs) including gonorrhea and chlamydia if:  You are sexually active and are younger than 24 years.  You are older than 24 years and your health care provider tells you that you are at risk for this type of infection.  Your sexual activity has changed since you were last screened and you are at an increased risk for chlamydia or gonorrhea. Ask your health care provider if you are at risk.  If you are at risk of being infected with HIV, it is recommended that you take a prescription medicine daily to prevent HIV infection. This is  called preexposure prophylaxis (PrEP). You are considered at risk if:  You are a heterosexual woman, are sexually active, and are at increased risk for HIV infection.  You take drugs by injection.  You are sexually active with a partner who has HIV.  Talk with your health care provider about whether you are at high risk of being infected with HIV. If you choose to begin PrEP, you should first be tested for HIV. You should then be tested every 3 months for as long as you are taking PrEP.  Osteoporosis is a disease in which the bones lose minerals and strength   with aging. This can result in serious bone fractures or breaks. The risk of osteoporosis can be identified using a bone density scan. Women ages 65 years and over and women at risk for fractures or osteoporosis should discuss screening with their health care providers. Ask your health care provider whether you should take a calcium supplement or vitamin D to reduce the rate of osteoporosis.  Menopause can be associated with physical symptoms and risks. Hormone replacement therapy is available to decrease symptoms and risks. You should talk to your health care provider about whether hormone replacement therapy is right for you.  Use sunscreen. Apply sunscreen liberally and repeatedly throughout the day. You should seek shade when your shadow is shorter than you. Protect yourself by wearing long sleeves, pants, a wide-brimmed hat, and sunglasses year round, whenever you are outdoors.  Once a month, do a whole body skin exam, using a mirror to look at the skin on your back. Tell your health care provider of new moles, moles that have irregular borders, moles that are larger than a pencil eraser, or moles that have changed in shape or color.  Stay current with required vaccines (immunizations).  Influenza vaccine. All adults should be immunized every year.  Tetanus, diphtheria, and acellular pertussis (Td, Tdap) vaccine. Pregnant women should  receive 1 dose of Tdap vaccine during each pregnancy. The dose should be obtained regardless of the length of time since the last dose. Immunization is preferred during the 27th-36th week of gestation. An adult who has not previously received Tdap or who does not know her vaccine status should receive 1 dose of Tdap. This initial dose should be followed by tetanus and diphtheria toxoids (Td) booster doses every 10 years. Adults with an unknown or incomplete history of completing a 3-dose immunization series with Td-containing vaccines should begin or complete a primary immunization series including a Tdap dose. Adults should receive a Td booster every 10 years.  Varicella vaccine. An adult without evidence of immunity to varicella should receive 2 doses or a second dose if she has previously received 1 dose. Pregnant females who do not have evidence of immunity should receive the first dose after pregnancy. This first dose should be obtained before leaving the health care facility. The second dose should be obtained 4-8 weeks after the first dose.  Human papillomavirus (HPV) vaccine. Females aged 13-26 years who have not received the vaccine previously should obtain the 3-dose series. The vaccine is not recommended for use in pregnant females. However, pregnancy testing is not needed before receiving a dose. If a female is found to be pregnant after receiving a dose, no treatment is needed. In that case, the remaining doses should be delayed until after the pregnancy. Immunization is recommended for any person with an immunocompromised condition through the age of 26 years if she did not get any or all doses earlier. During the 3-dose series, the second dose should be obtained 4-8 weeks after the first dose. The third dose should be obtained 24 weeks after the first dose and 16 weeks after the second dose.  Zoster vaccine. One dose is recommended for adults aged 60 years or older unless certain conditions are  present.  Measles, mumps, and rubella (MMR) vaccine. Adults born before 1957 generally are considered immune to measles and mumps. Adults born in 1957 or later should have 1 or more doses of MMR vaccine unless there is a contraindication to the vaccine or there is laboratory evidence of immunity to   each of the three diseases. A routine second dose of MMR vaccine should be obtained at least 28 days after the first dose for students attending postsecondary schools, health care workers, or international travelers. People who received inactivated measles vaccine or an unknown type of measles vaccine during 1963-1967 should receive 2 doses of MMR vaccine. People who received inactivated mumps vaccine or an unknown type of mumps vaccine before 1979 and are at high risk for mumps infection should consider immunization with 2 doses of MMR vaccine. For females of childbearing age, rubella immunity should be determined. If there is no evidence of immunity, females who are not pregnant should be vaccinated. If there is no evidence of immunity, females who are pregnant should delay immunization until after pregnancy. Unvaccinated health care workers born before 1957 who lack laboratory evidence of measles, mumps, or rubella immunity or laboratory confirmation of disease should consider measles and mumps immunization with 2 doses of MMR vaccine or rubella immunization with 1 dose of MMR vaccine.  Pneumococcal 13-valent conjugate (PCV13) vaccine. When indicated, a person who is uncertain of her immunization history and has no record of immunization should receive the PCV13 vaccine. An adult aged 19 years or older who has certain medical conditions and has not been previously immunized should receive 1 dose of PCV13 vaccine. This PCV13 should be followed with a dose of pneumococcal polysaccharide (PPSV23) vaccine. The PPSV23 vaccine dose should be obtained at least 8 weeks after the dose of PCV13 vaccine. An adult aged 19  years or older who has certain medical conditions and previously received 1 or more doses of PPSV23 vaccine should receive 1 dose of PCV13. The PCV13 vaccine dose should be obtained 1 or more years after the last PPSV23 vaccine dose.  Pneumococcal polysaccharide (PPSV23) vaccine. When PCV13 is also indicated, PCV13 should be obtained first. All adults aged 65 years and older should be immunized. An adult younger than age 65 years who has certain medical conditions should be immunized. Any person who resides in a nursing home or long-term care facility should be immunized. An adult smoker should be immunized. People with an immunocompromised condition and certain other conditions should receive both PCV13 and PPSV23 vaccines. People with human immunodeficiency virus (HIV) infection should be immunized as soon as possible after diagnosis. Immunization during chemotherapy or radiation therapy should be avoided. Routine use of PPSV23 vaccine is not recommended for American Indians, [place] Natives, or people younger than 65 years unless there are medical conditions that require PPSV23 vaccine. When indicated, people who have unknown immunization and have no record of immunization should receive PPSV23 vaccine. One-time revaccination 5 years after the first dose of PPSV23 is recommended for people aged 19-64 years who have chronic kidney failure, nephrotic syndrome, asplenia, or immunocompromised conditions. People who received 1-2 doses of PPSV23 before age 65 years should receive another dose of PPSV23 vaccine at age 65 years or later if at least 5 years have passed since the previous dose. Doses of PPSV23 are not needed for people immunized with PPSV23 at or after age 65 years.  Meningococcal vaccine. Adults with asplenia or persistent complement component deficiencies should receive 2 doses of quadrivalent meningococcal conjugate (MenACWY-D) vaccine. The doses should be obtained at least 2 months apart.  Microbiologists working with certain meningococcal bacteria, military recruits, people at risk during an outbreak, and people who travel to or live in countries with a high rate of meningitis should be immunized. A first-year college student up through age   21 years who is living in a residence hall should receive a dose if she did not receive a dose on or after her 16th birthday. Adults who have certain high-risk conditions should receive one or more doses of vaccine.  Hepatitis A vaccine. Adults who wish to be protected from this disease, have certain high-risk conditions, work with hepatitis A-infected animals, work in hepatitis A research labs, or travel to or work in countries with a high rate of hepatitis A should be immunized. Adults who were previously unvaccinated and who anticipate close contact with an international adoptee during the first 60 days after arrival in the [place] from a country with a high rate of hepatitis A should be immunized.  Hepatitis B vaccine. Adults who wish to be protected from this disease, have certain high-risk conditions, may be exposed to blood or other infectious body fluids, are household contacts or sex partners of hepatitis B positive people, are clients or workers in certain care facilities, or travel to or work in countries with a high rate of hepatitis B should be immunized.  Haemophilus influenzae type b (Hib) vaccine. A previously unvaccinated person with asplenia or sickle cell disease or having a scheduled splenectomy should receive 1 dose of Hib vaccine. Regardless of previous immunization, a recipient of a hematopoietic stem cell transplant should receive a 3-dose series 6-12 months after her successful transplant. Hib vaccine is not recommended for adults with HIV infection. Preventive Services / Frequency Ages 64 to 68 years  Blood pressure check.** / Every 1 to 2 years.  Lipid and cholesterol check.** / Every 5 years beginning at age  22.  Clinical breast exam.** / Every 3 years for women in their 88s and 53s.  BRCA-related cancer risk assessment.** / For women who have family members with a BRCA-related cancer (breast, ovarian, tubal, or peritoneal cancers).  Pap test.** / Every 2 years from ages 90 through 51. Every 3 years starting at age 21 through age 56 or 3 with a history of 3 consecutive normal Pap tests.  HPV screening.** / Every 3 years from ages 24 through ages 1 to 46 with a history of 3 consecutive normal Pap tests.  Hepatitis C blood test.** / For any individual with known risks for hepatitis C.  Skin self-exam. / Monthly.  Influenza vaccine. / Every year.  Tetanus, diphtheria, and acellular pertussis (Tdap, Td) vaccine.** / Consult your health care provider. Pregnant women should receive 1 dose of Tdap vaccine during each pregnancy. 1 dose of Td every 10 years.  Varicella vaccine.** / Consult your health care provider. Pregnant females who do not have evidence of immunity should receive the first dose after pregnancy.  HPV vaccine. / 3 doses over 6 months, if 72 and younger. The vaccine is not recommended for use in pregnant females. However, pregnancy testing is not needed before receiving a dose.  Measles, mumps, rubella (MMR) vaccine.** / You need at least 1 dose of MMR if you were born in 1957 or later. You may also need a 2nd dose. For females of childbearing age, rubella immunity should be determined. If there is no evidence of immunity, females who are not pregnant should be vaccinated. If there is no evidence of immunity, females who are pregnant should delay immunization until after pregnancy.  Pneumococcal 13-valent conjugate (PCV13) vaccine.** / Consult your health care provider.  Pneumococcal polysaccharide (PPSV23) vaccine.** / 1 to 2 doses if you smoke cigarettes or if you have certain conditions.  Meningococcal vaccine.** /  1 dose if you are age 19 to 21 years and a first-year college  student living in a residence hall, or have one of several medical conditions, you need to get vaccinated against meningococcal disease. You may also need additional booster doses.  Hepatitis A vaccine.** / Consult your health care provider.  Hepatitis B vaccine.** / Consult your health care provider.  Haemophilus influenzae type b (Hib) vaccine.** / Consult your health care provider. Ages 40 to 64 years  Blood pressure check.** / Every 1 to 2 years.  Lipid and cholesterol check.** / Every 5 years beginning at age 20 years.  Lung cancer screening. / Every year if you are aged 55-80 years and have a 30-pack-year history of smoking and currently smoke or have quit within the past 15 years. Yearly screening is stopped once you have quit smoking for at least 15 years or develop a health problem that would prevent you from having lung cancer treatment.  Clinical breast exam.** / Every year after age 40 years.  BRCA-related cancer risk assessment.** / For women who have family members with a BRCA-related cancer (breast, ovarian, tubal, or peritoneal cancers).  Mammogram.** / Every year beginning at age 40 years and continuing for as long as you are in good health. Consult with your health care provider.  Pap test.** / Every 3 years starting at age 30 years through age 65 or 70 years with a history of 3 consecutive normal Pap tests.  HPV screening.** / Every 3 years from ages 30 years through ages 65 to 70 years with a history of 3 consecutive normal Pap tests.  Fecal occult blood test (FOBT) of stool. / Every year beginning at age 50 years and continuing until age 75 years. You may not need to do this test if you get a colonoscopy every 10 years.  Flexible sigmoidoscopy or colonoscopy.** / Every 5 years for a flexible sigmoidoscopy or every 10 years for a colonoscopy beginning at age 50 years and continuing until age 75 years.  Hepatitis C blood test.** / For all people born from 1945 through  1965 and any individual with known risks for hepatitis C.  Skin self-exam. / Monthly.  Influenza vaccine. / Every year.  Tetanus, diphtheria, and acellular pertussis (Tdap/Td) vaccine.** / Consult your health care provider. Pregnant women should receive 1 dose of Tdap vaccine during each pregnancy. 1 dose of Td every 10 years.  Varicella vaccine.** / Consult your health care provider. Pregnant females who do not have evidence of immunity should receive the first dose after pregnancy.  Zoster vaccine.** / 1 dose for adults aged 60 years or older.  Measles, mumps, rubella (MMR) vaccine.** / You need at least 1 dose of MMR if you were born in 1957 or later. You may also need a 2nd dose. For females of childbearing age, rubella immunity should be determined. If there is no evidence of immunity, females who are not pregnant should be vaccinated. If there is no evidence of immunity, females who are pregnant should delay immunization until after pregnancy.  Pneumococcal 13-valent conjugate (PCV13) vaccine.** / Consult your health care provider.  Pneumococcal polysaccharide (PPSV23) vaccine.** / 1 to 2 doses if you smoke cigarettes or if you have certain conditions.  Meningococcal vaccine.** / Consult your health care provider.  Hepatitis A vaccine.** / Consult your health care provider.  Hepatitis B vaccine.** / Consult your health care provider.  Haemophilus influenzae type b (Hib) vaccine.** / Consult your health care provider. Ages 65   years and over  Blood pressure check.** / Every 1 to 2 years.  Lipid and cholesterol check.** / Every 5 years beginning at age 43 years.  Lung cancer screening. / Every year if you are aged 35-80 years and have a 30-pack-year history of smoking and currently smoke or have quit within the past 15 years. Yearly screening is stopped once you have quit smoking for at least 15 years or develop a health problem that would prevent you from having lung cancer  treatment.  Clinical breast exam.** / Every year after age 46 years.  BRCA-related cancer risk assessment.** / For women who have family members with a BRCA-related cancer (breast, ovarian, tubal, or peritoneal cancers).  Mammogram.** / Every year beginning at age 11 years and continuing for as long as you are in good health. Consult with your health care provider.  Pap test.** / Every 3 years starting at age 16 years through age 46 or 64 years with 3 consecutive normal Pap tests. Testing can be stopped between 65 and 70 years with 3 consecutive normal Pap tests and no abnormal Pap or HPV tests in the past 10 years.  HPV screening.** / Every 3 years from ages 50 years through ages 87 or 38 years with a history of 3 consecutive normal Pap tests. Testing can be stopped between 65 and 70 years with 3 consecutive normal Pap tests and no abnormal Pap or HPV tests in the past 10 years.  Fecal occult blood test (FOBT) of stool. / Every year beginning at age 26 years and continuing until age 76 years. You may not need to do this test if you get a colonoscopy every 10 years.  Flexible sigmoidoscopy or colonoscopy.** / Every 5 years for a flexible sigmoidoscopy or every 10 years for a colonoscopy beginning at age 64 years and continuing until age 58 years.  Hepatitis C blood test.** / For all people born from [birth year] through 1966 and any individual with known risks for hepatitis C.  Osteoporosis screening.** / A one-time screening for women ages 25 years and over and women at risk for fractures or osteoporosis.  Skin self-exam. / Monthly.  Influenza vaccine. / Every year.  Tetanus, diphtheria, and acellular pertussis (Tdap/Td) vaccine.** / 1 dose of Td every 10 years.  Varicella vaccine.** / Consult your health care provider.  Zoster vaccine.** / 1 dose for adults aged 62 years or older.  Pneumococcal 13-valent conjugate (PCV13) vaccine.** / Consult your health care provider.  Pneumococcal  polysaccharide (PPSV23) vaccine.** / 1 dose for all adults aged 45 years and older.  Meningococcal vaccine.** / Consult your health care provider.  Hepatitis A vaccine.** / Consult your health care provider.  Hepatitis B vaccine.** / Consult your health care provider.  Haemophilus influenzae type b (Hib) vaccine.** / Consult your health care provider. ** Family history and personal history of risk and conditions may change your health care provider's recommendations. Document Released: 04/21/2001 Document Revised: 07/10/2013 Document Reviewed: 07/21/2010 [facility] Patient Information 2015 [place], [place]. This information is not intended to replace advice given to you by your health care provider. Make sure you discuss any questions you have with your health care provider. Hypertension Hypertension, commonly called high blood pressure, is when the force of blood pumping through your arteries is too strong. Your arteries are the blood vessels that carry blood from your heart throughout your body. A blood pressure reading consists of a higher number over a lower number, such as 110/72. The higher number (  systolic) is the pressure inside your arteries when your heart pumps. The lower number (diastolic) is the pressure inside your arteries when your heart relaxes. Ideally you want your blood pressure below 120/80. Hypertension forces your heart to work harder to pump blood. Your arteries may become narrow or stiff. Having hypertension puts you at risk for heart disease, stroke, and other problems.  RISK FACTORS Some risk factors for high blood pressure are controllable. Others are not.  Risk factors you cannot control include:   Race. You may be at higher risk if you are African American.  Age. Risk increases with age.  Gender. Men are at higher risk than women before age 35 years. After age 25, women are at higher risk than men. Risk factors you can control include:  Not getting enough exercise  or physical activity.  Being overweight.  Getting too much fat, sugar, calories, or salt in your diet.  Drinking too much alcohol. SIGNS AND SYMPTOMS Hypertension does not usually cause signs or symptoms. Extremely high blood pressure (hypertensive crisis) may cause headache, anxiety, shortness of breath, and nosebleed. DIAGNOSIS  To check if you have hypertension, your health care provider will measure your blood pressure while you are seated, with your arm held at the level of your heart. It should be measured at least twice using the same arm. Certain conditions can cause a difference in blood pressure between your right and left arms. A blood pressure reading that is higher than normal on one occasion does not mean that you need treatment. If one blood pressure reading is high, ask your health care provider about having it checked again. TREATMENT  Treating high blood pressure includes making lifestyle changes and possibly taking medicine. Living a healthy lifestyle can help lower high blood pressure. You may need to change some of your habits. Lifestyle changes may include:  Following the DASH diet. This diet is high in fruits, vegetables, and whole grains. It is low in salt, red meat, and added sugars.  Getting at least 2 hours of brisk physical activity every week.  Losing weight if necessary.  Not smoking.  Limiting alcoholic beverages.  Learning ways to reduce stress. If lifestyle changes are not enough to get your blood pressure under control, your health care provider may prescribe medicine. You may need to take more than one. Work closely with your health care provider to understand the risks and benefits. HOME CARE INSTRUCTIONS  Have your blood pressure rechecked as directed by your health care provider.   Take medicines only as directed by your health care provider. Follow the directions carefully. Blood pressure medicines must be taken as prescribed. The medicine does  not work as well when you skip doses. Skipping doses also puts you at risk for problems.   Do not smoke.   Monitor your blood pressure at home as directed by your health care provider. SEEK MEDICAL CARE IF:   You think you are having a reaction to medicines taken.  You have recurrent headaches or feel dizzy.  You have swelling in your ankles.  You have trouble with your vision. SEEK IMMEDIATE MEDICAL CARE IF:  You develop a severe headache or confusion.  You have unusual weakness, numbness, or feel faint.  You have severe chest or abdominal pain.  You vomit repeatedly.  You have trouble breathing. MAKE SURE YOU:   Understand these instructions.  Will watch your condition.  Will get help right away if you are not doing well or get  worse. Document Released: 02/23/2005 Document Revised: 07/10/2013 Document Reviewed: 12/16/2012 [facility] Patient Information 2015 [place], [place]. This information is not intended to replace advice given to you by your health care provider. Make sure you discuss any questions you have with your health care provider.

## 2014-03-14 NOTE — Progress Notes (Signed)
   Subjective:    Patient ID: [name], female    DOB: 09/04/1965, 49 y.o.   MRN: [id number]  Hypertension This is a chronic problem. The current episode started more than 1 year ago. The problem is unchanged. The problem is controlled. Pertinent negatives include no anxiety, blurred vision, chest pain, headaches, malaise/fatigue, neck pain, orthopnea, palpitations, peripheral edema, PND, shortness of breath or sweats. Risk factors for coronary artery disease include obesity. Past treatments include beta blockers. The current treatment provides moderate improvement. Compliance problems include diet and exercise.       Review of Systems  Constitutional: Negative.  Negative for fever, chills, malaise/fatigue, diaphoresis, appetite change and fatigue.  HENT: Negative.   Eyes: Negative.  Negative for blurred vision.  Respiratory: Negative.  Negative for cough, choking, chest tightness, shortness of breath and stridor.   Cardiovascular: Negative.  Negative for chest pain, palpitations, orthopnea, leg swelling and PND.  Gastrointestinal: Negative.  Negative for nausea, abdominal pain, diarrhea, constipation and blood in stool.  Endocrine: Negative.   Genitourinary: Negative.   Musculoskeletal: Negative.  Negative for myalgias, back pain, arthralgias and neck pain.  Skin: Negative.   Allergic/Immunologic: Negative.   Neurological: Negative.  Negative for dizziness, tremors, weakness, light-headedness, numbness and headaches.  Hematological: Negative.  Negative for adenopathy. Does not bruise/bleed easily.  Psychiatric/Behavioral: Negative.        Objective:   Physical Exam  Constitutional: She is oriented to person, place, and time. She appears well-developed and well-nourished. No distress.  HENT:  Head: Normocephalic and atraumatic.  Mouth/Throat: Oropharynx is clear and moist. No oropharyngeal exudate.  Eyes: Conjunctivae are normal. Right eye exhibits no discharge. Left eye  exhibits no discharge. No scleral icterus.  Neck: Normal range of motion. Neck supple. No JVD present. No tracheal deviation present. No thyromegaly present.  Cardiovascular: Normal rate, regular rhythm, normal heart sounds and intact distal pulses.  Exam reveals no gallop and no friction rub.   No murmur heard. Pulmonary/Chest: Effort normal and breath sounds normal. No stridor. No respiratory distress. She has no wheezes. She has no rales. She exhibits no tenderness.  Abdominal: Soft. Bowel sounds are normal. She exhibits no distension and no mass. There is no tenderness. There is no rebound and no guarding.  Musculoskeletal: Normal range of motion. She exhibits no edema or tenderness.  Lymphadenopathy:    She has no cervical adenopathy.  Neurological: She is oriented to person, place, and time.  Skin: Skin is warm and dry. No rash noted. She is not diaphoretic. No erythema. No pallor.  Psychiatric: She has a normal mood and affect. Her behavior is normal. Judgment and thought content normal.  Vitals reviewed.     Lab Results  Component Value Date   WBC 12.9* 11/07/2013   HGB 12.5 11/07/2013   HCT 39.6 11/07/2013   PLT 276 11/07/2013   GLUCOSE 104* 11/07/2013   CHOL 188 11/04/2011   TRIG 89.0 11/04/2011   HDL 51.40 11/04/2011   LDLCALC 119* 11/04/2011   ALT 19 01/27/2013   AST 22 01/27/2013   NA 138 11/07/2013   K 3.9 11/07/2013   CL 101 11/07/2013   CREATININE 0.66 11/07/2013   BUN 12 11/07/2013   CO2 24 11/07/2013   TSH 1.63 06/30/2012   INR 1.0 07/06/2007   HGBA1C 5.9 06/28/2013      Assessment & Plan:

## 2014-03-15 ENCOUNTER — Encounter: Payer: Self-pay | Admitting: Internal Medicine

## 2014-03-15 ENCOUNTER — Telehealth: Payer: Self-pay | Admitting: Internal Medicine

## 2014-03-15 MED ORDER — PROMETHAZINE-DM 6.25-15 MG/5ML PO SYRP: 5.0000 mL | ORAL_SOLUTION | Freq: Four times a day (QID) | ORAL | Status: DC | PRN

## 2014-03-15 NOTE — Telephone Encounter (Signed)
Try phenergan-dm

## 2014-03-15 NOTE — Assessment & Plan Note (Signed)
Her WBC has been slightly elevated but stable for over a year now This is a benign issue, will follow

## 2014-03-15 NOTE — Assessment & Plan Note (Signed)
Vaccines were reviewed and updated Exam done PAP and Mammo are up-to-date Labs ordered Pt ed material was given.

## 2014-03-15 NOTE — Telephone Encounter (Signed)
Pt stated both if you would.   Sx: cough - deep and non productive, fever, nasal congestion and runny nose.

## 2014-03-15 NOTE — Telephone Encounter (Signed)
Does she want an antibiotic or a symptoms reliever, or both?

## 2014-03-15 NOTE — Assessment & Plan Note (Signed)
Her BP is well controlled 

## 2014-03-15 NOTE — Telephone Encounter (Signed)
Pt informed rx sent to pharmacy.

## 2014-03-15 NOTE — Telephone Encounter (Signed)
Is requesting script for upper respiratory infection.  States seen Dr. Yetta BarreJones yesterday.  Patient uses [facility] on [address]

## 2014-03-15 NOTE — Assessment & Plan Note (Signed)
She is during well on the PPI

## 2014-03-21 ENCOUNTER — Ambulatory Visit: Payer: BC Managed Care – PPO | Admitting: Pulmonary Disease

## 2014-04-20 ENCOUNTER — Telehealth: Payer: Self-pay | Admitting: Internal Medicine

## 2014-04-20 NOTE — Telephone Encounter (Signed)
Not on current med list and PCP out of office, will need appointment

## 2014-04-20 NOTE — Telephone Encounter (Signed)
robaxin 

## 2014-04-20 NOTE — Telephone Encounter (Signed)
Is requesting script for robax for her back.

## 2014-04-21 ENCOUNTER — Encounter (HOSPITAL_COMMUNITY): Payer: Self-pay

## 2014-04-21 ENCOUNTER — Emergency Department (HOSPITAL_COMMUNITY)
Admission: EM | Admit: 2014-04-21 | Discharge: 2014-04-21 | Disposition: A | Discharge status: Home / Self Care | Payer: BLUE CROSS/BLUE SHIELD | Source: Home / Self Care | Attending: Family Medicine | Admitting: Family Medicine

## 2014-04-21 DIAGNOSIS — S39012A Strain of muscle, fascia and tendon of lower back, initial encounter: Secondary | ICD-10-CM

## 2014-04-21 MED ORDER — CYCLOBENZAPRINE HCL 5 MG PO TABS: 5.0000 mg | ORAL_TABLET | Freq: Three times a day (TID) | ORAL | Status: DC | PRN

## 2014-04-21 MED ORDER — KETOROLAC TROMETHAMINE 60 MG/2ML IM SOLN
60.0000 mg | Freq: Once | INTRAMUSCULAR | Status: AC
  Administered 2014-04-21: 60 mg via INTRAMUSCULAR

## 2014-04-21 MED ORDER — TRAMADOL HCL 50 MG PO TABS: 50.0000 mg | ORAL_TABLET | Freq: Four times a day (QID) | ORAL | Status: DC | PRN

## 2014-04-21 MED ORDER — KETOROLAC TROMETHAMINE 60 MG/2ML IM SOLN
INTRAMUSCULAR | Status: AC
  Filled 2014-04-21: qty 2

## 2014-04-21 NOTE — ED Notes (Signed)
C/o onset yesterday of pain in back and into right leg after she tripped on bed pillow thrown onto floor

## 2014-04-21 NOTE — ED Provider Notes (Signed)
CSN: [id number]     Arrival date & time 04/21/14  1145 History   First MD Initiated Contact with Patient 04/21/14 1312     Chief Complaint  Patient presents with  . Back Pain   (Consider location/radiation/quality/duration/timing/severity/associated sxs/prior Treatment) Patient is a 49 y.o. female presenting with back pain. The history is provided by the patient.  Back Pain Location:  Lumbar spine Quality:  Shooting Radiates to:  R posterior upper leg Pain severity:  Mild Onset quality:  Sudden Duration:  1 day Progression:  Unchanged Chronicity:  New Context: falling   Context comment:  Tripped over pillows when changing bed at home and fell to floor. Relieved by:  None tried Worsened by:  Sitting Associated symptoms: no abdominal pain, no abdominal swelling, no bladder incontinence, no bowel incontinence, no dysuria, no numbness, no paresthesias, no perianal numbness, no tingling and no weakness   Risk factors: obesity     Past Medical History  Diagnosis Date  . Hypertension   . Sleep apnea     does not wear CPAP  . GERD (gastroesophageal reflux disease)   . Arthritis   . Morbid obesity   . Hiatal hernia   . Migraines    Past Surgical History  Procedure Laterality Date  . Abdominal hysterectomy    . Cardiovascular stress test  07/25/2007    Negative stress echo; ef 55-60%  . Knee arthroscopy  01/2011    left  . Esophageal manometry  03/07/2012    Procedure: ESOPHAGEAL MANOMETRY (EM);  Surgeon: Mardella Laymanavid R Patterson, MD;  Location: [facility];  Service: Endoscopy;  Laterality: N/A;  . Knee surgery    . Abdominal hysterectomy  2003   Family History  Problem Relation Age of Onset  . Cervical cancer Mother   . Hypertension Sister   . Diabetes Sister   . Heart disease Neg Hx   . Cancer Neg Hx   . Alcohol abuse Neg Hx   . Early death Neg Hx   . Hearing loss Neg Hx   . Hyperlipidemia Neg Hx   . Kidney disease Neg Hx   . Stroke Neg Hx    History  Substance Use  Topics  . Smoking status: Never Smoker   . Smokeless tobacco: Never Used  . Alcohol Use: No   OB History    No data available     Review of Systems  Constitutional: Negative.   Gastrointestinal: Negative.  Negative for abdominal pain and bowel incontinence.  Genitourinary: Negative for bladder incontinence, dysuria and flank pain.  Musculoskeletal: Positive for back pain and gait problem. Negative for myalgias, joint swelling and neck pain.  Skin: Negative.   Neurological: Negative for tingling, weakness, numbness and paresthesias.    Allergies  Oxycodone  Home Medications   Prior to Admission medications   Medication Sig Start Date End Date Taking? Authorizing Provider  acetaminophen (TYLENOL) 500 MG tablet Take 1,000 mg by mouth every 6 (six) hours as needed. For pain    Historical Provider, MD  Calcium Carbonate-Vitamin D (CALTRATE 600+D) 600-400 MG-UNIT per chew tablet Chew 1 tablet by mouth as needed.     Historical Provider, MD  cyclobenzaprine (FLEXERIL) 5 MG tablet Take 1 tablet (5 mg total) by mouth 3 (three) times daily as needed for muscle spasms. 04/21/14   Linna HoffJames D Kleo Dungee, MD  metoprolol succinate (TOPROL-XL) 50 MG 24 hr tablet Take 1 tablet (50 mg total) by mouth daily. 03/14/14   Etta Grandchildhomas L Jones, MD  pantoprazole (PROTONIX)  40 MG tablet Take 1 tablet (40 mg total) by mouth 2 (two) times daily before a meal. 30 mins before breakfast/supper 03/14/14 07/05/15  [name], MD  promethazine-dextromethorphan (PROMETHAZINE-DM) 6.25-15 MG/5ML syrup Take 5 mLs by mouth 4 (four) times daily as needed for cough. 03/15/14   [name], MD  traMADol (ULTRAM) 50 MG tablet Take 1 tablet (50 mg total) by mouth every 6 (six) hours as needed. 04/21/14   Linna Hoff, MD   BP 142/90 mmHg  Pulse 65  Temp(Src) 97.8 F (36.6 C) (Oral)  Resp 16  SpO2 98% Physical Exam  Constitutional: She is oriented to person, place, and time. She appears well-developed and well-nourished.  Abdominal:  Soft. Bowel sounds are normal.  Musculoskeletal: She exhibits tenderness.       Lumbar back: She exhibits tenderness, pain and spasm. She exhibits no bony tenderness, no swelling and normal pulse.       Back:  Neurological: She is alert and oriented to person, place, and time.  Skin: Skin is warm and dry.  Nursing note and vitals reviewed.   ED Course  Procedures (including critical care time) Labs Review Labs Reviewed - No data to display  Imaging Review No results found.   MDM   1. Low back strain, initial encounter       Linna Hoff, MD 04/21/14 1336

## 2014-04-25 NOTE — Telephone Encounter (Signed)
Spoke with patient in regards. 

## 2014-05-01 ENCOUNTER — Encounter: Payer: Self-pay | Admitting: Internal Medicine

## 2014-05-01 ENCOUNTER — Ambulatory Visit (INDEPENDENT_AMBULATORY_CARE_PROVIDER_SITE_OTHER): Payer: BLUE CROSS/BLUE SHIELD | Admitting: Internal Medicine

## 2014-05-01 VITALS — BP 140/88 | HR 72 | Temp 98.1°F | Resp 18 | Ht 65.0 in | Wt 313.1 lb

## 2014-05-01 DIAGNOSIS — M79604 Pain in right leg: Secondary | ICD-10-CM

## 2014-05-01 DIAGNOSIS — M545 Low back pain: Secondary | ICD-10-CM

## 2014-05-01 DIAGNOSIS — I10 Essential (primary) hypertension: Secondary | ICD-10-CM

## 2014-05-01 DIAGNOSIS — M79605 Pain in left leg: Secondary | ICD-10-CM

## 2014-05-01 MED ORDER — METHOCARBAMOL 750 MG PO TABS: 750.0000 mg | ORAL_TABLET | Freq: Three times a day (TID) | ORAL | Status: DC | PRN

## 2014-05-01 NOTE — Assessment & Plan Note (Signed)
She has LBP that radiates only into her hips but there is no N/W/T in her legs and there are no warning or danger signs, will not xray for now since xrays not likely to be helpful She does not like the was flexeril makes her feels and she wants to try robaxin Will cont tramadol as needed

## 2014-05-01 NOTE — Assessment & Plan Note (Signed)
Her BP is well controlled 

## 2014-05-01 NOTE — Progress Notes (Signed)
Subjective:    Patient ID: [name], female    DOB: 02/04/1966, 49 y.o.   MRN: [id number]  Back Pain This is a recurrent problem. The current episode started 1 to 4 weeks ago. The problem occurs intermittently. The problem is unchanged. The pain is present in the lumbar spine. The quality of the pain is described as shooting. The pain does not radiate. The pain is at a severity of 4/10. The pain is moderate. The pain is worse during the day. The symptoms are aggravated by bending and position. Pertinent negatives include no abdominal pain, bladder incontinence, bowel incontinence, chest pain, dysuria, fever, headaches, leg pain, numbness, paresis, paresthesias, pelvic pain, perianal numbness, tingling, weakness or weight loss. Risk factors include obesity and lack of exercise. She has tried NSAIDs, muscle relaxant and analgesics for the symptoms. The treatment provided moderate relief.      Review of Systems  Constitutional: Negative.  Negative for fever, chills, weight loss, diaphoresis, appetite change and fatigue.  HENT: Negative.   Eyes: Negative.   Respiratory: Negative.  Negative for cough, choking, chest tightness, shortness of breath and stridor.   Cardiovascular: Negative.  Negative for chest pain, palpitations and leg swelling.  Gastrointestinal: Negative.  Negative for nausea, vomiting, abdominal pain, diarrhea, constipation, blood in stool and bowel incontinence.  Endocrine: Negative.   Genitourinary: Negative.  Negative for bladder incontinence, dysuria and pelvic pain.  Musculoskeletal: Positive for back pain. Negative for myalgias, joint swelling, arthralgias, gait problem, neck pain and neck stiffness.  Skin: Negative.   Allergic/Immunologic: Negative.   Neurological: Negative.  Negative for tingling, weakness, numbness, headaches and paresthesias.  Hematological: Negative.  Negative for adenopathy.  Psychiatric/Behavioral: Negative.        Objective:   Physical  Exam  Constitutional: She appears well-developed and well-nourished. No distress.  HENT:  Head: Normocephalic and atraumatic.  Mouth/Throat: Oropharynx is clear and moist. No oropharyngeal exudate.  Eyes: Conjunctivae are normal. Right eye exhibits no discharge. Left eye exhibits no discharge. No scleral icterus.  Neck: Normal range of motion. Neck supple. No JVD present. No tracheal deviation present. No thyromegaly present.  Cardiovascular: Normal rate, regular rhythm, normal heart sounds and intact distal pulses.  Exam reveals no gallop and no friction rub.   No murmur heard. Pulmonary/Chest: Effort normal and breath sounds normal. No stridor. No respiratory distress. She has no wheezes. She has no rales. She exhibits no tenderness.  Abdominal: Soft. Bowel sounds are normal. She exhibits no distension. There is no tenderness. There is no rebound and no guarding.  Musculoskeletal: Normal range of motion. She exhibits no edema or tenderness.       Lumbar back: Normal. She exhibits normal range of motion, no tenderness, no bony tenderness, no swelling, no edema, no deformity, no laceration, no pain, no spasm and normal pulse.  Lymphadenopathy:    She has no cervical adenopathy.  Neurological: She is alert. She has normal strength. She displays no atrophy, no tremor and normal reflexes. No cranial nerve deficit or sensory deficit. She exhibits normal muscle tone. She displays a negative Romberg sign. She displays no seizure activity. Coordination and gait normal.  Reflex Scores:      Tricep reflexes are 0 on the right side and 0 on the left side.      Bicep reflexes are 0 on the right side and 0 on the left side.      Brachioradialis reflexes are 0 on the right side and 0 on the left side.  Patellar reflexes are 0 on the right side and 0 on the left side.      Achilles reflexes are 0 on the right side and 0 on the left side. Neg SLR in BLE  Skin: Skin is warm and dry. No rash noted. She is  not diaphoretic. No erythema. No pallor.  Vitals reviewed.         Assessment & Plan:

## 2014-05-01 NOTE — Progress Notes (Signed)
Pre visit review using our clinic review tool, if applicable. No additional management support is needed unless otherwise documented below in the visit note. 

## 2014-05-01 NOTE — Patient Instructions (Signed)
Back Pain, Adult Low back pain is very common. About 1 in 5 people have back pain.The cause of low back pain is rarely dangerous. The pain often gets better over time.About half of people with a sudden onset of back pain feel better in just 2 weeks. About 8 in 10 people feel better by 6 weeks.  CAUSES Some common causes of back pain include:  Strain of the muscles or ligaments supporting the spine.  Wear and tear (degeneration) of the spinal discs.  Arthritis.  Direct injury to the back. DIAGNOSIS Most of the time, the direct cause of low back pain is not known.However, back pain can be treated effectively even when the exact cause of the pain is unknown.Answering your caregiver's questions about your overall health and symptoms is one of the most accurate ways to make sure the cause of your pain is not dangerous. If your caregiver needs more information, he or she may order lab work or imaging tests (X-rays or MRIs).However, even if imaging tests show changes in your back, this usually does not require surgery. HOME CARE INSTRUCTIONS For many people, back pain returns.Since low back pain is rarely dangerous, it is often a condition that people can learn to manageon their own.   Remain active. It is stressful on the back to sit or stand in one place. Do not sit, drive, or stand in one place for more than 30 minutes at a time. Take short walks on level surfaces as soon as pain allows.Try to increase the length of time you walk each day.  Do not stay in bed.Resting more than 1 or 2 days can delay your recovery.  Do not avoid exercise or work.Your body is made to move.It is not dangerous to be active, even though your back may hurt.Your back will likely heal faster if you return to being active before your pain is gone.  Pay attention to your body when you bend and lift. Many people have less discomfortwhen lifting if they bend their knees, keep the load close to their bodies,and  avoid twisting. Often, the most comfortable positions are those that put less stress on your recovering back.  Find a comfortable position to sleep. Use a firm mattress and lie on your side with your knees slightly bent. If you lie on your back, put a pillow under your knees.  Only take over-the-counter or prescription medicines as directed by your caregiver. Over-the-counter medicines to reduce pain and inflammation are often the most helpful.Your caregiver may prescribe muscle relaxant drugs.These medicines help dull your pain so you can more quickly return to your normal activities and healthy exercise.  Put ice on the injured area.  Put ice in a plastic bag.  Place a towel between your skin and the bag.  Leave the ice on for 15-20 minutes, 03-04 times a day for the first 2 to 3 days. After that, ice and heat may be alternated to reduce pain and spasms.  Ask your caregiver about trying back exercises and gentle massage. This may be of some benefit.  Avoid feeling anxious or stressed.Stress increases muscle tension and can worsen back pain.It is important to recognize when you are anxious or stressed and learn ways to manage it.Exercise is a great option. SEEK MEDICAL CARE IF:  You have pain that is not relieved with rest or medicine.  You have pain that does not improve in 1 week.  You have new symptoms.  You are generally not feeling well. SEEK   IMMEDIATE MEDICAL CARE IF:   You have pain that radiates from your back into your legs.  You develop new bowel or bladder control problems.  You have unusual weakness or numbness in your arms or legs.  You develop nausea or vomiting.  You develop abdominal pain.  You feel faint. Document Released: 02/23/2005 Document Revised: 08/25/2011 Document Reviewed: 06/27/2013 [employer] Patient Information 2015 [employer]. This information is not intended to replace advice given to you by your health care provider. Make sure you  discuss any questions you have with your health care provider.  

## 2014-05-03 ENCOUNTER — Ambulatory Visit (INDEPENDENT_AMBULATORY_CARE_PROVIDER_SITE_OTHER): Payer: BLUE CROSS/BLUE SHIELD | Admitting: Pulmonary Disease

## 2014-05-03 ENCOUNTER — Encounter: Payer: Self-pay | Admitting: Pulmonary Disease

## 2014-05-03 VITALS — BP 140/82 | HR 69 | Temp 98.4°F | Ht 65.0 in | Wt 314.0 lb

## 2014-05-03 DIAGNOSIS — G4733 Obstructive sleep apnea (adult) (pediatric): Secondary | ICD-10-CM

## 2014-05-03 NOTE — Progress Notes (Signed)
Chief Complaint  Patient presents with  . Follow-up    Review sleep study.     History of Present Illness: [name] is a 49 y.o. female with OSA.  She is here to review her sleep study.  This showed mild sleep apnea.  TESTS: HST 01/01/14 >> AHI 6.8, SaO2 low 72%  Past medical hx >> HTN, GERD, HH, Migraine HA  Past surgical hx, Medications, Allergies, Family hx, Social hx all reviewed.   Physical Exam: Blood pressure 140/82, pulse 69, temperature 98.4 F (36.9 C), temperature source Oral, height 5\' 5"  (1.651 m), weight 314 lb (142.429 kg), SpO2 98 %. Body mass index is 52.25 kg/(m^2).  General - No distress ENT - No sinus tenderness, no oral exudate, no LAN, MP3, elongated uvula, enlarged tongue, 2+ tonsils Cardiac - s1s2 regular, no murmur Chest - No wheeze/rales/dullness Back - No focal tenderness Abd - Soft, non-tender Ext - No edema Neuro - Normal strength Skin - No rashes Psych - normal mood, and behavior   Assessment/Plan:  Obstructive sleep apnea. I have reviewed the recent sleep study results with the patient.  We discussed how sleep apnea can affect various health problems including risks for hypertension, cardiovascular disease, and diabetes.  We also discussed how sleep disruption can increase risks for accident, such as while driving.  Weight loss as a means of improving sleep apnea was also reviewed.  Additional treatment options discussed were CPAP therapy, oral appliance, and surgical intervention. Plan: - will arrange for auto CPAP set up  Shift work syndrome. Plan: - discussed options to improve her sleep quality  Obesity. Plan: - discussed options to assist with weight loss   Coralyn HellingVineet Mohab Ashby, MD [facility] Pager:  [phone]

## 2014-05-03 NOTE — Patient Instructions (Signed)
Will arrange for CPAP set up at home Follow up in 2 months after CPAP set up 

## 2014-05-21 ENCOUNTER — Other Ambulatory Visit: Payer: Self-pay | Admitting: Internal Medicine

## 2014-07-03 ENCOUNTER — Ambulatory Visit (INDEPENDENT_AMBULATORY_CARE_PROVIDER_SITE_OTHER): Payer: BLUE CROSS/BLUE SHIELD | Admitting: Pulmonary Disease

## 2014-07-03 DIAGNOSIS — G4733 Obstructive sleep apnea (adult) (pediatric): Secondary | ICD-10-CM

## 2014-07-05 NOTE — Progress Notes (Signed)
Note created in error.

## 2014-08-10 ENCOUNTER — Ambulatory Visit (INDEPENDENT_AMBULATORY_CARE_PROVIDER_SITE_OTHER): Payer: BLUE CROSS/BLUE SHIELD | Admitting: Family

## 2014-08-10 ENCOUNTER — Encounter: Payer: Self-pay | Admitting: Family

## 2014-08-10 VITALS — BP 138/88 | HR 68 | Temp 98.2°F | Resp 18 | Ht 65.0 in | Wt 308.0 lb

## 2014-08-10 DIAGNOSIS — J0100 Acute maxillary sinusitis, unspecified: Secondary | ICD-10-CM | POA: Diagnosis not present

## 2014-08-10 DIAGNOSIS — J320 Chronic maxillary sinusitis: Secondary | ICD-10-CM | POA: Insufficient documentation

## 2014-08-10 MED ORDER — PROMETHAZINE-DM 6.25-15 MG/5ML PO SYRP: 5.0000 mL | ORAL_SOLUTION | Freq: Four times a day (QID) | ORAL | Status: DC | PRN

## 2014-08-10 MED ORDER — AMOXICILLIN-POT CLAVULANATE 875-125 MG PO TABS: 1.0000 | ORAL_TABLET | Freq: Two times a day (BID) | ORAL | Status: DC

## 2014-08-10 NOTE — Progress Notes (Signed)
Subjective:    Patient ID: [name], female    DOB: Dec 15, 1965, 49 y.o.   MRN: [id number]  Chief Complaint  Patient presents with  . Sinus issues    puffy watery eyes, congestion, little productive cough, sinus headache, x1 week    HPI:  [name] is a 49 y.o. female with a PMH of hypertension, sleep apnea, GERD, and leukocytosis who presents today for an acute office visit.  Associated symptoms of puffy watery eyes, congestion, productive cough, and sinus headache going on for approximately one week. Modifying factors include benedryl, claritin, and tylenol sinus which have not helped. Indicates that she originally got better and then got worse over the course of the week. Denies any recent antibiotic use. Severity of the cough is enough to keep her up at night. Timing of symptoms is worse at night.    Allergies  Allergen Reactions  . Oxycodone Shortness Of Breath    Current Outpatient Prescriptions on File Prior to Visit  Medication Sig Dispense Refill  . acetaminophen (TYLENOL) 500 MG tablet Take 1,000 mg by mouth every 6 (six) hours as needed. For pain    . Calcium Carbonate-Vitamin D (CALTRATE 600+D) 600-400 MG-UNIT per chew tablet Chew 1 tablet by mouth as needed.     . methocarbamol (ROBAXIN-750) 750 MG tablet Take 1 tablet (750 mg total) by mouth every 8 (eight) hours as needed for muscle spasms. 90 tablet 2  . metoprolol succinate (TOPROL-XL) 50 MG 24 hr tablet Take 1 tablet (50 mg total) by mouth daily. 90 tablet 2  . pantoprazole (PROTONIX) 40 MG tablet Take 1 tablet (40 mg total) by mouth 2 (two) times daily before a meal. 30 mins before breakfast/supper 90 tablet 3  . traMADol (ULTRAM) 50 MG tablet TAKE 1 TABLET BY MOUTH EVERY 6 HOURS AS NEEDED 65 tablet 3  . [DISCONTINUED] dexlansoprazole (DEXILANT) 60 MG capsule Take 1 capsule (60 mg total) by mouth daily. 30 capsule 11  . [DISCONTINUED] diphenhydrAMINE (BENADRYL) 50 MG capsule Take 50 mg by mouth every  6 (six) hours as needed.    . [DISCONTINUED] diphenhydramine-acetaminophen (TYLENOL PM) 25-500 MG TABS Take 1 tablet by mouth at bedtime as needed. For pain     No current facility-administered medications on file prior to visit.    Review of Systems  Constitutional: Positive for fever. Negative for chills.  HENT: Positive for congestion, sinus pressure and sore throat. Negative for ear pain.   Respiratory: Positive for cough. Negative for chest tightness and shortness of breath.   Neurological: Positive for headaches.      Objective:    BP 138/88 mmHg  Pulse 68  Temp(Src) 98.2 F (36.8 C) (Oral)  Resp 18  Ht  (1.651 m)  Wt 308 lb (139.708 kg)  BMI 51.25 kg/m2  SpO2 95% Nursing note and vital signs reviewed.  Physical Exam  Constitutional: She is oriented to person, place, and time. She appears well-developed and well-nourished. No distress.  HENT:  Right Ear: Hearing, tympanic membrane, external ear and ear canal normal.  Left Ear: Hearing, tympanic membrane, external ear and ear canal normal.  Nose: Right sinus exhibits maxillary sinus tenderness. Right sinus exhibits no frontal sinus tenderness. Left sinus exhibits maxillary sinus tenderness. Left sinus exhibits no frontal sinus tenderness.  Mouth/Throat: Uvula is midline and oropharynx is clear and moist.  Left nostril with pale and inflamed turbinate.   Cardiovascular: Normal rate, regular rhythm, normal heart sounds and intact distal pulses.  Pulmonary/Chest: Effort normal and breath sounds normal.  Neurological: She is alert and oriented to person, place, and time.  Skin: Skin is warm and dry.  Psychiatric: She has a normal mood and affect. Her behavior is normal. Judgment and thought content normal.       Assessment & Plan:    Problem List Items Addressed This Visit      Respiratory   Maxillary sinusitis - Primary    Symptoms and exam consistent with maxillary sinusitis. Start Augmentin. Start Phenergan  DM as needed for cough and sleep. Continue over-the-counter medications as needed for symptom relief and supportive care. Follow up if symptoms worsen or fail to improve.      Relevant Medications   amoxicillin-clavulanate (AUGMENTIN) 875-125 MG per tablet   promethazine-dextromethorphan (PROMETHAZINE-DM) 6.25-15 MG/5ML syrup

## 2014-08-10 NOTE — Assessment & Plan Note (Signed)
Symptoms and exam consistent with maxillary sinusitis. Start Augmentin. Start Phenergan DM as needed for cough and sleep. Continue over-the-counter medications as needed for symptom relief and supportive care. Follow up if symptoms worsen or fail to improve.

## 2014-08-10 NOTE — Progress Notes (Signed)
Pre visit review using our clinic review tool, if applicable. No additional management support is needed unless otherwise documented below in the visit note. 

## 2014-08-10 NOTE — Patient Instructions (Signed)
Thank you for choosing [facility].  Summary/Instructions:  Your prescription(s) have been submitted to your pharmacy or been printed and provided for you. Please take as directed and contact our office if you believe you are having problem(s) with the medication(s) or have any questions.  Please stop by the lab on the basement level of the building for your blood work. Your results will be released to MyChart (or called to you) after review, usually within 72 hours after test completion. If any changes need to be made, you will be notified at that same time.  Please stop by radiology on the basement level of the building for your x-rays. Your results will be released to MyChart (or called to you) after review, usually within 72 hours after test completion. If any treatments or changes are necessary, you will be notified at that same time.  Referrals have been made during this visit. You should expect to hear back from our schedulers in about 7-10 days in regards to establishing an appointment with the specialists we discussed.   If your symptoms worsen or fail to improve, please contact our office for further instruction, or in case of emergency go directly to the emergency room at the closest medical facility.   General Recommendations:    Please drink plenty of fluids.  Get plenty of rest   Sleep in humidified air  Use saline nasal sprays  Netti pot   OTC Medications:  Decongestants - helps relieve congestion   Flonase (generic fluticasone) or Nasacort (generic triamcinolone) - please make sure to use the "cross-over" technique at a 45 degree angle towards the opposite eye as opposed to straight up the nasal passageway.   If you have HIGH BLOOD PRESSURE - Coricidin HBP; AVOID any product that is -D as this contains pseudoephedrine which may increase your blood pressure.  Afrin (oxymetazoline) every 6-8 hours for up to 3 days.   Allergies - helps relieve runny nose,  itchy eyes and sneezing   Claritin (generic loratidine), Allegra (fexofenidine), or Zyrtec (generic cyrterizine) for runny nose. These medications should not cause drowsiness.  Note - Benadryl (generic diphenhydramine) may be used however may cause drowsiness  Cough -   Delsym or Robitussin (generic dextromethorphan)  Expectorants - helps loosen mucus to ease removal   Mucinex (generic guaifenesin) as directed on the package.  Headaches / General Aches   Tylenol (generic acetaminophen) - DO NOT EXCEED 3 grams (3,000 mg) in a 24 hour time period  Advil/Motrin (generic ibuprofen)   Sore Throat -   Salt water gargle   Chloraseptic (generic benzocaine) spray or lozenges / Sucrets (generic dyclonine)    Sinusitis Sinusitis is redness, soreness, and inflammation of the paranasal sinuses. Paranasal sinuses are air pockets within the bones of your face (beneath the eyes, the middle of the forehead, or above the eyes). In healthy paranasal sinuses, mucus is able to drain out, and air is able to circulate through them by way of your nose. However, when your paranasal sinuses are inflamed, mucus and air can become trapped. This can allow bacteria and other germs to grow and cause infection. Sinusitis can develop quickly and last only a short time (acute) or continue over a long period (chronic). Sinusitis that lasts for more than 12 weeks is considered chronic.  CAUSES  Causes of sinusitis include:  Allergies.  Structural abnormalities, such as displacement of the cartilage that separates your nostrils (deviated septum), which can decrease the air flow through your nose and  sinuses and affect sinus drainage.  Functional abnormalities, such as when the small hairs (cilia) that line your sinuses and help remove mucus do not work properly or are not present. SIGNS AND SYMPTOMS  Symptoms of acute and chronic sinusitis are the same. The primary symptoms are pain and pressure around the  affected sinuses. Other symptoms include:  Upper toothache.  Earache.  Headache.  Bad breath.  Decreased sense of smell and taste.  A cough, which worsens when you are lying flat.  Fatigue.  Fever.  Thick drainage from your nose, which often is green and may contain pus (purulent).  Swelling and warmth over the affected sinuses. DIAGNOSIS  Your health care provider will perform a physical exam. During the exam, your health care provider may:  Look in your nose for signs of abnormal growths in your nostrils (nasal polyps).  Tap over the affected sinus to check for signs of infection.  View the inside of your sinuses (endoscopy) using an imaging device that has a light attached (endoscope). If your health care provider suspects that you have chronic sinusitis, one or more of the following tests may be recommended:  Allergy tests.  Nasal culture. A sample of mucus is taken from your nose, sent to a lab, and screened for bacteria.  Nasal cytology. A sample of mucus is taken from your nose and examined by your health care provider to determine if your sinusitis is related to an allergy. TREATMENT  Most cases of acute sinusitis are related to a viral infection and will resolve on their own within 10 days. Sometimes medicines are prescribed to help relieve symptoms (pain medicine, decongestants, nasal steroid sprays, or saline sprays).  However, for sinusitis related to a bacterial infection, your health care provider will prescribe antibiotic medicines. These are medicines that will help kill the bacteria causing the infection.  Rarely, sinusitis is caused by a fungal infection. In theses cases, your health care provider will prescribe antifungal medicine. For some cases of chronic sinusitis, surgery is needed. Generally, these are cases in which sinusitis recurs more than 3 times per year, despite other treatments. HOME CARE INSTRUCTIONS   Drink plenty of water. Water helps thin  the mucus so your sinuses can drain more easily.  Use a humidifier.  Inhale steam 3 to 4 times a day (for example, sit in the bathroom with the shower running).  Apply a warm, moist washcloth to your face 3 to 4 times a day, or as directed by your health care provider.  Use saline nasal sprays to help moisten and clean your sinuses.  Take medicines only as directed by your health care provider.  If you were prescribed either an antibiotic or antifungal medicine, finish it all even if you start to feel better. SEEK IMMEDIATE MEDICAL CARE IF:  You have increasing pain or severe headaches.  You have nausea, vomiting, or drowsiness.  You have swelling around your face.  You have vision problems.  You have a stiff neck.  You have difficulty breathing. MAKE SURE YOU:   Understand these instructions.  Will watch your condition.  Will get help right away if you are not doing well or get worse. Document Released: 02/23/2005 Document Revised: 07/10/2013 Document Reviewed: 03/10/2011 [facility] Patient Information 2015 [place], [place]. This information is not intended to replace advice given to you by your health care provider. Make sure you discuss any questions you have with your health care provider.

## 2014-10-15 ENCOUNTER — Encounter (HOSPITAL_COMMUNITY): Payer: Self-pay | Admitting: Oncology

## 2014-10-15 ENCOUNTER — Emergency Department (HOSPITAL_COMMUNITY)
Admission: EM | Admit: 2014-10-15 | Discharge: 2014-10-15 | Disposition: A | Discharge status: Home / Self Care | Payer: BLUE CROSS/BLUE SHIELD | Attending: Emergency Medicine | Admitting: Emergency Medicine

## 2014-10-15 DIAGNOSIS — I10 Essential (primary) hypertension: Secondary | ICD-10-CM | POA: Insufficient documentation

## 2014-10-15 DIAGNOSIS — Z8739 Personal history of other diseases of the musculoskeletal system and connective tissue: Secondary | ICD-10-CM | POA: Diagnosis not present

## 2014-10-15 DIAGNOSIS — K088 Other specified disorders of teeth and supporting structures: Secondary | ICD-10-CM | POA: Diagnosis present

## 2014-10-15 DIAGNOSIS — Z8669 Personal history of other diseases of the nervous system and sense organs: Secondary | ICD-10-CM | POA: Diagnosis not present

## 2014-10-15 DIAGNOSIS — Z79899 Other long term (current) drug therapy: Secondary | ICD-10-CM | POA: Diagnosis not present

## 2014-10-15 DIAGNOSIS — K029 Dental caries, unspecified: Secondary | ICD-10-CM | POA: Insufficient documentation

## 2014-10-15 DIAGNOSIS — K219 Gastro-esophageal reflux disease without esophagitis: Secondary | ICD-10-CM | POA: Diagnosis not present

## 2014-10-15 DIAGNOSIS — G43909 Migraine, unspecified, not intractable, without status migrainosus: Secondary | ICD-10-CM | POA: Diagnosis not present

## 2014-10-15 MED ORDER — LIDOCAINE HCL (PF) 1 % IJ SOLN
5.0000 mL | Freq: Once | INTRAMUSCULAR | Status: AC
  Administered 2014-10-15: 5 mL
  Filled 2014-10-15: qty 5

## 2014-10-15 MED ORDER — PENICILLIN V POTASSIUM 500 MG PO TABS: 500.0000 mg | ORAL_TABLET | Freq: Four times a day (QID) | ORAL | Status: AC

## 2014-10-15 MED ORDER — BUPIVACAINE HCL (PF) 0.5 % IJ SOLN
50.0000 mL | Freq: Once | INTRAMUSCULAR | Status: AC
  Administered 2014-10-15: 50 mL
  Filled 2014-10-15: qty 60

## 2014-10-15 MED ORDER — PENICILLIN V POTASSIUM 500 MG PO TABS
500.0000 mg | ORAL_TABLET | Freq: Once | ORAL | Status: AC
  Administered 2014-10-15: 500 mg via ORAL
  Filled 2014-10-15: qty 1

## 2014-10-15 NOTE — ED Notes (Signed)
Per pt she has had left upper dental pain since Friday not relieved by OTC pain medication.

## 2014-10-15 NOTE — Discharge Instructions (Signed)
Dental Care and Dentist Visits °Dental care supports good overall health. Regular dental visits can also help you avoid dental pain, bleeding, infection, and other more serious health problems in the future. It is important to keep the mouth healthy because diseases in the teeth, gums, and other oral tissues can spread to other areas of the body. Some problems, such as diabetes, heart disease, and pre-term labor have been associated with poor oral health.  °See your dentist every 6 months. If you experience emergency problems such as a toothache or broken tooth, go to the dentist right away. If you see your dentist regularly, you may catch problems early. It is easier to be treated for problems in the early stages.  °WHAT TO EXPECT AT A DENTIST VISIT  °Your dentist will look for many common oral health problems and recommend proper treatment. At your regular dental visit, you can expect: °· Gentle cleaning of the teeth and gums. This includes scraping and polishing. This helps to remove the sticky substance around the teeth and gums (plaque). Plaque forms in the mouth shortly after eating. Over time, plaque hardens on the teeth as tartar. If tartar is not removed regularly, it can cause problems. Cleaning also helps remove stains. °· Periodic X-rays. These pictures of the teeth and supporting bone will help your dentist assess the health of your teeth. °· Periodic fluoride treatments. Fluoride is a natural mineral shown to help strengthen teeth. Fluoride treatment involves applying a fluoride gel or varnish to the teeth. It is most commonly done in children. °· Examination of the mouth, tongue, jaws, teeth, and gums to look for any oral health problems, such as: °· Cavities (dental caries). This is decay on the tooth caused by plaque, sugar, and acid in the mouth. It is best to catch a cavity when it is small. °· Inflammation of the gums caused by plaque buildup (gingivitis). °· Problems with the mouth or malformed  or misaligned teeth. °· Oral cancer or other diseases of the soft tissues or jaws.  °KEEP YOUR TEETH AND GUMS HEALTHY °For healthy teeth and gums, follow these general guidelines as well as your dentist's specific advice: °· Have your teeth professionally cleaned at the dentist every 6 months. °· Brush twice daily with a fluoride toothpaste. °· Floss your teeth daily.  °· Ask your dentist if you need fluoride supplements, treatments, or fluoride toothpaste. °· Eat a healthy diet. Reduce foods and drinks with added sugar. °· Avoid smoking. °TREATMENT FOR ORAL HEALTH PROBLEMS °If you have oral health problems, treatment varies depending on the conditions present in your teeth and gums. °· Your caregiver will most likely recommend good oral hygiene at each visit. °· For cavities, gingivitis, or other oral health disease, your caregiver will perform a procedure to treat the problem. This is typically done at a separate appointment. Sometimes your caregiver will refer you to another dental specialist for specific tooth problems or for surgery. °SEEK IMMEDIATE DENTAL CARE IF: °· You have pain, bleeding, or soreness in the gum, tooth, jaw, or mouth area. °· A permanent tooth becomes loose or separated from the gum socket. °· You experience a blow or injury to the mouth or jaw area. °Document Released: 11/05/2010 Document Revised: 05/18/2011 Document Reviewed: 11/05/2010 °ExitCare® Patient Information ©2015 [employer]. This information is not intended to replace advice given to you by your health care provider. Make sure you discuss any questions you have with your health care provider. °Dental Caries °Dental caries (also called tooth decay) is   the most common oral disease. It can occur at any age but is more common in children and young adults.  °HOW DENTAL CARIES DEVELOPS  °The process of decay begins when bacteria and foods (particularly sugars and starches) combine in your mouth to produce plaque. Plaque is a  substance that sticks to the hard, outer surface of a tooth (enamel). The bacteria in plaque produce acids that attack enamel. These acids may also attack the root surface of a tooth (cementum) if it is exposed. Repeated attacks dissolve these surfaces and create holes in the tooth (cavities). If left untreated, the acids destroy the other layers of the tooth.  °RISK FACTORS °· Frequent sipping of sugary beverages.   °· Frequent snacking on sugary and starchy foods, especially those that easily get stuck in the teeth.   °· Poor oral hygiene.   °· Dry mouth.   °· Substance abuse such as methamphetamine abuse.   °· Broken or poor-fitting dental restorations.   °· Eating disorders.   °· Gastroesophageal reflux disease (GERD).   °· Certain radiation treatments to the head and neck. °SYMPTOMS °In the early stages of dental caries, symptoms are seldom present. Sometimes white, chalky areas may be seen on the enamel or other tooth layers. In later stages, symptoms may include: °· Pits and holes on the enamel. °· Toothache after sweet, hot, or cold foods or drinks are consumed. °· Pain around the tooth. °· Swelling around the tooth. °DIAGNOSIS  °Most of the time, dental caries is detected during a regular dental checkup. A diagnosis is made after a thorough medical and dental history is taken and the surfaces of your teeth are checked for signs of dental caries. Sometimes special instruments, such as lasers, are used to check for dental caries. Dental X-ray exams may be taken so that areas not visible to the eye (such as between the contact areas of the teeth) can be checked for cavities.  °TREATMENT  °If dental caries is in its early stages, it may be reversed with a fluoride treatment or an application of a remineralizing agent at the dental office. Thorough brushing and flossing at home is needed to aid these treatments. If it is in its later stages, treatment depends on the location and extent of tooth destruction:   °· If a small area of the tooth has been destroyed, the destroyed area will be removed and cavities will be filled with a material such as gold, silver amalgam, or composite resin.   °· If a large area of the tooth has been destroyed, the destroyed area will be removed and a cap (crown) will be fitted over the remaining tooth structure.   °· If the center part of the tooth (pulp) is affected, a procedure called a root canal will be needed before a filling or crown can be placed.   °· If most of the tooth has been destroyed, the tooth may need to be pulled (extracted). °HOME CARE INSTRUCTIONS °You can prevent, stop, or reverse dental caries at home by practicing good oral hygiene. Good oral hygiene includes: °· Thoroughly cleaning your teeth at least twice a day with a toothbrush and dental floss.   °· Using a fluoride toothpaste. A fluoride mouth rinse may also be used if recommended by your dentist or health care provider.   °· Restricting the amount of sugary and starchy foods and sugary liquids you consume.   °· Avoiding frequent snacking on these foods and sipping of these liquids.   °· Keeping regular visits with a dentist for checkups and cleanings. °PREVENTION  °·   Practice good oral hygiene. °· Consider a dental sealant. A dental sealant is a coating material that is applied by your dentist to the pits and grooves of teeth. The sealant prevents food from being trapped in them. It may protect the teeth for several years. °· Ask about fluoride supplements if you live in a community without fluorinated water or with water that has a low fluoride content. Use fluoride supplements as directed by your dentist or health care provider. °· Allow fluoride varnish applications to teeth if directed by your dentist or health care provider. °Document Released: 11/15/2001 Document Revised: 07/10/2013 Document Reviewed: 02/26/2012 °ExitCare® Patient Information ©2015 [employer]. This information is not intended to  replace advice given to you by your health care provider. Make sure you discuss any questions you have with your health care provider. ° ° ° °Emergency Department Resource Guide °1) Find a Doctor and Pay Out of Pocket °Although you won't have to find out who is covered by your insurance plan, it is a good idea to ask around and get recommendations. You will then need to call the office and see if the doctor you have chosen will accept you as a new patient and what types of options they offer for patients who are self-pay. Some doctors offer discounts or will set up payment plans for their patients who do not have insurance, but you will need to ask so you aren't surprised when you get to your appointment. ° °2) Contact Your Local Health Department °Not all health departments have doctors that can see patients for sick visits, but many do, so it is worth a call to see if yours does. If you don't know where your local health department is, you can check in your phone book. The CDC also has a tool to help you locate your state's health department, and many state websites also have listings of all of their local health departments. ° °3) Find a Walk-in Clinic °If your illness is not likely to be very severe or complicated, you may want to try a walk in clinic. These are popping up all over the country in pharmacies, drugstores, and shopping centers. They're usually staffed by nurse practitioners or physician assistants that have been trained to treat common illnesses and complaints. They're usually fairly quick and inexpensive. However, if you have serious medical issues or chronic medical problems, these are probably not your best option. ° °No Primary Care Doctor: °- Call [facility] at  832-8000 - they can help you locate a primary care doctor that  accepts your insurance, provides certain services, etc. °- Physician Referral Service- 1-800-533-3463 ° °Chronic Pain Problems: °Organization         Address  Phone    Notes  ° [facility]  (336) 297-2271 Patients need to be referred by their primary care doctor.  ° °Medication Assistance: °Organization         Address  Phone   Notes  °[place] [facility] [address] °[place], [place] [address] (336) 641-8030 --Must be a resident of [place] °-- Must have NO insurance coverage whatsoever (no Medicaid/ Medicare, etc.) °-- The pt. MUST have a primary care doctor that directs their care regularly and follows them in the community °  °[facility]  (866) 331-1348   °[employer]  (888) 892-1162   ° °Agencies that provide inexpensive medical care: °Organization         Address  Phone     Notes  °[facility]  (336) 832-8035   °[facility]    (336) 832-7272   °[facility] [address] °[place], [place] [address] (336) 832-4777   °[facility] [address], °[place] (336) 271-4999   °[facility]    (336) 373-0678   °[facility]    (336) 272-1050   °[facility] ° [address], [place] Phone:  (336) 832-4444, Fax:  (336) 832-4440 Hours of Operation:  9 am - 6 pm, M-F.  Also accepts Medicaid/Medicare and self-pay.  °[facility] ° [address], [place] Phone: (336) 832-3150, Fax: (336) [phone]. Hours of Operation:  8:30 am - 5:30 pm, M-F.  Also accepts Medicaid and self-pay.  °[facility] [place] [address], [place] Phone: (336) 878-6027   °[facility] [address], [place], [place] (336)723-1848, Ext. 123 Mondays & Thursdays: 7-9 AM.  First 15 patients are seen on a first come, first serve basis. °  ° °Medicaid-accepting [place] Providers: ° °Organization         Address  Phone   Notes  °[facility] [address], [place] (336) 641-2100 Also accepts self-pay patients.  °[facility]  [address], [place] ° (336) 856-9996   °[facility] [address], [place] (336) 288-8857   °[facility] [address] [place] [address], [place] (336) 299-7000   °Veita Bland [address], [place]  ° (336) 373-1557 Only accepts [place] Access Medicaid patients after they have their name applied to their card.  ° °Self-Pay (no insurance) in [place]: ° °Organization         Address  Phone   Notes  °Sickle Cell Patients, [facility] [address], [place] (336) 832-1970   °[facility] [address], [place] (336) 832-4400   °[facility] ° [address], [place] (336) 992-4800   °[facility]/Dr. Osei-Bonsu ° [address] [place] [address], [place] or [address], [place] (336) [phone] Phone number for both [place] and [place] locations is the same.  °[facility] [address], [place] (336) 299-0000   °[facility] [address] [place] [address], [place] or [address] (336) 852-7530 °(336) 878-2260   °[facility] [address], [place] (336) 350-1642, phone; (336) 294-5005, fax Sees patients 1st and 3rd Saturday of every month.  Must not qualify for public or private insurance (i.e. Medicaid, Medicare, [place] Health Choice, Veterans' Benefits) • Household income should be no more than 200% of the poverty level •The clinic cannot treat you if you are pregnant or think you are pregnant • Sexually transmitted diseases are not treated at the clinic.  ° ° °Dental Care: °Organization         Address  Phone  Notes  °[place] [facility] [address],  (336) 641-6152 Accepts children up to age 21 who are enrolled in Medicaid or [place] Health Choice; pregnant women with a Medicaid card; and children who have  applied for Medicaid or [place] Health Choice, but were declined, whose parents can pay a reduced fee at time of service.  °[place]   [facility] [place]  [address], [place] (336) 641-7733 Accepts children up to age 21 who are enrolled in Medicaid or [place] Health Choice; pregnant women with a Medicaid card; and children who have applied for Medicaid or [place] Health Choice, but were declined, whose parents can pay a reduced fee at time of service.  °[facility] ° [address], [place] (336) 641-4533 Patients are seen by appointment only. Walk-ins are not accepted. [facility] will see patients 18 years of age and older. °Monday - Tuesday (8am-5pm) °Most Wednesdays (8:30-5pm) °$30 per visit, cash only  °[facility] ° [address], [place] (336) 641-4533 Patients are seen by appointment only. Walk-ins are not accepted. [facility] will see patients 18 years of age and older. °One Wednesday Evening (Monthly: Volunteer Based).  $30 per visit, cash only  °[facility]  (919) 537-3737 for adults; Children under age 4, call Graduate Pediatric Dentistry at ([phone]) [phone]. Children aged 4-14, please call ([phone]) [phone] to request a pediatric application. ° Dental services are provided in all areas of dental care including fillings, crowns and bridges, complete and partial dentures, implants, gum treatment, root canals, and extractions. Preventive care is also provided. Treatment is provided to both adults and children. °Patients are selected via a lottery and there is often a waiting list. °  °[facility] [address], °[place] ° (336) 763-8833 [email or web address] °  °[facility] [address], [place], [place] (336)723-1848, Ext. 123 Second and Fourth Thursday of each month, opens at 6:30 AM; Clinic ends at 9 AM.  Patients are seen on a first-come first-served basis, and a  limited number are seen during each clinic.  ° °[facility] ° [address], [place], [place] (336) 723-7904   Eligibility Requirements °You must have lived in [place], [place], or [place] counties for at least the last three months. °  You cannot be eligible for state or federal sponsored healthcare insurance, including Veterans Administration, Medicaid, or Medicare. °  You generally cannot be eligible for healthcare insurance through your employer.  °  How to apply: °Eligibility screenings are held every Tuesday and Wednesday afternoon from 1:00 pm until 4:00 pm. You do not need an appointment for the interview!  °[facility] [address], [place], [place] 336-631-2330   °[facility]  336-342-8273   °[facility]  336-703-3100   °[facility]  336-570-6415   ° °Behavioral Health Resources in the Community: °Intensive Outpatient Programs °Organization         Address  Phone  Notes  °[facility] [address], [place], [place] 336-878-6098   °[facility] [address], [place], [place] 336-832-9800   °[facility] [address], [place], [place] ° 336-882-2125   °[facility] [address],  °[place], [place] 1-800-853-5163 or 336-641-4981   °Substance Abuse Resources °Organization         Address  Phone  Notes  °[facility]  336-882-2125   °[facility]  336-784-9470   °[facility]  336-285-9073   °[facility]  336-845-3988   °Residential & Outpatient Substance Abuse Program  1-800-659-3381   °Psychological Services °Organization         Address  Phone  Notes  °[facility]  336- 832-9600   °[facility]  336- 378-7881   °[facility] [address], [place] 1-800-853-5163 or 336-641-4981   ° °Mobile Crisis Teams °Organization          Address  Phone  Notes  °[facility], Mobile Crisis Care Unit  1-877-626-1772   °Assertive °[facility] ° [address] [place], [place] 336-834-9664   °Sharon DeEsch [address] °[place] 336-554-5454   ° °Self-Help/Support Groups °Organization         Address  Phone             Notes  °[facility] - variety of support groups  336- 373-1402 Call for more information  °Narcotics Anonymous (NA), [facility] [address], °[place]  2 meetings at this location  ° °Residential Treatment Programs °Organization         Address  Phone  Notes  °[facility] [address],    °[place]  1-866-801-8205   °[facility] ° [address], [place], [place] 704-293-8524   °[facility] [address], [place] 336-845-3988 Admissions: 8am-3pm M-F  °[facility] [address]    °[place], [place] 336-841-1104   °[facility] [address], [place], [place] 336-379-7146   °[facility] [address]  °[place], [place] 336-285-9073   °[facility] - Intensive Outpatient [address], [place], [place] 336-852-3033   °[facility] [address]  °[place], [place] 1-877-615-2722 or 336-784-9470   °[facility] [address] [place], [place] 336-227-7417 Accepts Medicaid  °[facility] [address]  ° [place] 1-800-659-3381 Substance Abuse/Addiction Treatment  ° °[place] Behavioral Health Resources °Organization         Address  Phone  Notes  °[facility]  (888) 581-9988   °Julie Brannon, PhD [address] [place], [place]   (336) 349-5553 or (336) 951-0000   °[facility]   [address] °[place], [place] (336) 349-4454   °[facility] [address], [place], [place] (336) 342-8316 Insurance/Medicaid/sponsorship  through [facility]  °[facility] [address]                                    [place], [place] (336) 342-8316 Therapy/tele-psych/case  °[facility] [address]  ° [place], [place] (336) 349-2233    °Dr. Arfeen  (336) 349-4544   °[facility] [place]  [employer] [place] [facility] 1) [address],  °2) [address], [place] °3)  [address], [place] (336) 349-3220 °(336) 342-7768 ° °(336) 342-8140   °[place] Child Abuse Hotline (336) 342-1394 or (336) 342-3537 (After Hours)    ° ° ° °

## 2014-10-15 NOTE — ED Provider Notes (Signed)
TIME SEEN: 4:45 AM  CHIEF COMPLAINT: Dental pain  HPI: Pt is a 49 y.o. female with history of hypertension, migraines who presents to the emergency department with left upper dental pain for the past several days. No facial swelling, erythema or warmth. Is able to swallow her secretions. No fever. No history of injury. 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 Is taking tramadol at home with some relief. States she was concerned about infection and that's what brought her to the hospital.  ROS: See HPI Constitutional: no fever  Eyes: no drainage  ENT: no runny nose   Cardiovascular:  no chest pain  Resp: no SOB  GI: no vomiting GU: no dysuria Integumentary: no rash  Allergy: no hives  Musculoskeletal: no leg swelling  Neurological: no slurred speech ROS otherwise negative  PAST MEDICAL HISTORY/PAST SURGICAL HISTORY:  Past Medical History  Diagnosis Date  . Hypertension   . Sleep apnea        . GERD (gastroesophageal reflux disease)   . Arthritis   . Morbid obesity   . Hiatal hernia   . Migraines     MEDICATIONS:  Prior to Admission medications   Medication Sig Start Date End Date Taking? Authorizing Provider  acetaminophen (TYLENOL) 500 MG tablet Take 1,000 mg by mouth every 6 (six) hours as needed. For pain   Yes Historical Provider, MD  Calcium Carbonate-Vitamin D (CALTRATE 600+D) 600-400 MG-UNIT per chew tablet Chew 1 tablet by mouth as needed.    Yes Historical Provider, MD  metoprolol succinate (TOPROL-XL) 50 MG 24 hr tablet Take 1 tablet (50 mg total) by mouth daily. 03/14/14  Yes [name], MD  pantoprazole (PROTONIX) 40 MG tablet Take 1 tablet (40 mg total) by mouth 2 (two) times daily before a meal. 30 mins before breakfast/supper 03/14/14 07/05/15 Yes [name], MD  traMADol (ULTRAM) 50 MG tablet Take 50 mg by mouth every 6 (six) hours as needed for moderate pain.   Yes Historical Provider, MD  amoxicillin-clavulanate (AUGMENTIN)  875-125 MG per tablet Take 1 tablet by mouth 2 (two) times daily. Patient not taking: Reported on 10/15/2014 08/10/14   Veryl Speak, FNP  methocarbamol (ROBAXIN-750) 750 MG tablet Take 1 tablet (750 mg total) by mouth every 8 (eight) hours as needed for muscle spasms. 05/01/14   [name], MD  promethazine-dextromethorphan (PROMETHAZINE-DM) 6.25-15 MG/5ML syrup Take 5 mLs by mouth 4 (four) times daily as needed. Patient not taking: Reported on 10/15/2014 08/10/14   Veryl Speak, FNP  traMADol (ULTRAM) 50 MG tablet TAKE 1 TABLET BY MOUTH EVERY 6 HOURS AS NEEDED Patient not taking: Reported on 10/15/2014 05/22/14   [name], MD    ALLERGIES:  Allergies  Allergen Reactions  . Oxycodone Shortness Of Breath    SOCIAL HISTORY:  History  Substance Use Topics  . Smoking status: Never Smoker   . Smokeless tobacco: Never Used  . Alcohol Use: No    FAMILY HISTORY: Family History  Problem Relation Age of Onset  . Cervical cancer Mother   . Hypertension Sister   . Diabetes Sister   . Heart disease Neg Hx   . Cancer Neg Hx   . Alcohol abuse Neg Hx   . Early death Neg Hx   . Hearing loss Neg Hx   . Hyperlipidemia Neg Hx   . Kidney disease Neg Hx   . Stroke Neg Hx     EXAM: BP 168/91  mmHg  Pulse 66  Temp(Src) 98.3 F (36.8 C) (Oral)  Resp 20  Ht  (1.651 m)  Wt 310 lb (140.615 kg)  BMI 51.59 kg/m2  SpO2 98% CONSTITUTIONAL: Alert and oriented and responds appropriately to questions. Well-appearing; well-nourished HEAD: Normocephalic EYES: Conjunctivae clear, PERRL ENT: normal nose; no rhinorrhea; moist mucous membranes; pharynx without lesions noted, multiple dental caries with no obvious sign of abscess, no trismus or drooling, no tonsillar hypertrophy or exudate, no uvular deviation, no stridor, normal phonation, no Ludwig angina, tongue sits flat in the bottom of the mouth, no facial erythema or warmth or swelling NECK: Supple, no meningismus, no LAD; full range of  motion in the neck that is painless, trachea midline, no subcutaneous air CARD: RRR; S1 and S2 appreciated; no murmurs, no clicks, no rubs, no gallops RESP: Normal chest excursion without splinting or tachypnea; breath sounds clear and equal bilaterally; no wheezes, no rhonchi, no rales, no hypoxia or respiratory distress, speaking full sentences ABD/GI: Normal bowel sounds; non-distended; soft, non-tender, no rebound, no guarding, no peritoneal signs BACK:  The back appears normal and is non-tender to palpation, there is no CVA tenderness EXT: Normal ROM in all joints; non-tender to palpation; no edema; normal capillary refill; no cyanosis, no calf tenderness or swelling    SKIN: Normal color for age and race; warm NEURO: Moves all extremities equally, sensation to light touch intact diffusely, cranial nerves II through XII intact PSYCH: The patient's mood and manner are appropriate. Grooming and personal hygiene are appropriate.  MEDICAL DECISION MAKING: Patient here with dental caries without obvious sign of abscess. She has agreed to a dental block which has provided her significant pain relief. States she has tramadol at home and does not want any more pain medication. She will schedule an appointment with her dentist in the morning. Will discharge on penicillin for prophylaxis. No sign of Ludwig angina on exam. Discussed usual and customary return precautions. She verbalized understanding and is comfortable with this plan.     NERVE BLOCK Date/Time: 10/15/2014 5:05 AM Performed by: Raelyn Number Authorized by: Raelyn Number Consent: Verbal consent obtained. Risks and benefits: risks, benefits and alternatives were discussed Consent given by: patient Patient identity confirmed: verbally with patient Indications: pain relief Body area: face/mouth Nerve: posterior superior alveolar and greater palatine block  Laterality: left Patient sedated: no Needle gauge: 27 G Location  technique: anatomical landmarks Local anesthetic: bupivacaine 0.5% without epinephrine and lidocaine 1% without epinephrine Anesthetic total: 5 ml Outcome: pain improved Patient tolerance: Patient tolerated the procedure well with no immediate complications    Layla Maw Bana Borgmeyer, DO 10/15/14 [phone]

## 2014-12-11 ENCOUNTER — Emergency Department (HOSPITAL_COMMUNITY): Payer: BLUE CROSS/BLUE SHIELD

## 2014-12-11 ENCOUNTER — Encounter (HOSPITAL_COMMUNITY): Payer: Self-pay | Admitting: Emergency Medicine

## 2014-12-11 ENCOUNTER — Observation Stay (HOSPITAL_COMMUNITY)
Admission: EM | Admit: 2014-12-11 | Discharge: 2014-12-13 | Disposition: A | Discharge status: Home / Self Care | Payer: BLUE CROSS/BLUE SHIELD | Attending: Internal Medicine | Admitting: Internal Medicine

## 2014-12-11 ENCOUNTER — Observation Stay (HOSPITAL_COMMUNITY): Payer: BLUE CROSS/BLUE SHIELD

## 2014-12-11 DIAGNOSIS — Z6841 Body Mass Index (BMI) 40.0 and over, adult: Secondary | ICD-10-CM | POA: Diagnosis not present

## 2014-12-11 DIAGNOSIS — G4733 Obstructive sleep apnea (adult) (pediatric): Secondary | ICD-10-CM | POA: Insufficient documentation

## 2014-12-11 DIAGNOSIS — I10 Essential (primary) hypertension: Secondary | ICD-10-CM | POA: Diagnosis not present

## 2014-12-11 DIAGNOSIS — D72829 Elevated white blood cell count, unspecified: Secondary | ICD-10-CM | POA: Diagnosis not present

## 2014-12-11 DIAGNOSIS — K219 Gastro-esophageal reflux disease without esophagitis: Secondary | ICD-10-CM | POA: Diagnosis not present

## 2014-12-11 DIAGNOSIS — R1013 Epigastric pain: Secondary | ICD-10-CM | POA: Diagnosis not present

## 2014-12-11 DIAGNOSIS — Z90710 Acquired absence of both cervix and uterus: Secondary | ICD-10-CM | POA: Diagnosis not present

## 2014-12-11 DIAGNOSIS — K760 Fatty (change of) liver, not elsewhere classified: Secondary | ICD-10-CM

## 2014-12-11 LAB — CBC
HCT: 39.3 % (ref 36.0–46.0)
HEMOGLOBIN: 12.4 g/dL (ref 12.0–15.0)
MCH: 27.3 pg (ref 26.0–34.0)
MCHC: 31.6 g/dL (ref 30.0–36.0)
MCV: 86.4 fL (ref 78.0–100.0)
PLATELETS: 251 10*3/uL (ref 150–400)
RBC: 4.55 MIL/uL (ref 3.87–5.11)
RDW: 14.5 % (ref 11.5–15.5)
WBC: 13.2 10*3/uL — ABNORMAL HIGH (ref 4.0–10.5)

## 2014-12-11 LAB — BASIC METABOLIC PANEL
Anion gap: 9 (ref 5–15)
BUN: 13 mg/dL (ref 6–20)
CALCIUM: 8.8 mg/dL — AB (ref 8.9–10.3)
CO2: 24 mmol/L (ref 22–32)
CREATININE: 0.73 mg/dL (ref 0.44–1.00)
Chloride: 105 mmol/L (ref 101–111)
GFR calc Af Amer: >60 mL/min (ref >60)
GFR calc non Af Amer: >60 mL/min (ref >60)
GLUCOSE: 112 mg/dL — AB (ref 65–99)
Potassium: 3.5 mmol/L (ref 3.5–5.1)
Sodium: 138 mmol/L (ref 135–145)

## 2014-12-11 LAB — HEPATIC FUNCTION PANEL
ALT: 70 U/L — ABNORMAL HIGH (ref 14–54)
AST: 264 U/L — AB (ref 15–41)
Albumin: 3.2 g/dL — ABNORMAL LOW (ref 3.5–5.0)
Alkaline Phosphatase: 108 U/L (ref 38–126)
BILIRUBIN DIRECT: 0.2 mg/dL (ref 0.1–0.5)
BILIRUBIN TOTAL: 0.6 mg/dL (ref 0.3–1.2)
Indirect Bilirubin: 0.4 mg/dL (ref 0.3–0.9)
Total Protein: 6.7 g/dL (ref 6.5–8.1)

## 2014-12-11 LAB — TROPONIN I: Troponin I: <0.03 ng/mL (ref <0.031)

## 2014-12-11 LAB — I-STAT TROPONIN, ED: TROPONIN I, POC: 0 ng/mL (ref 0.00–0.08)

## 2014-12-11 LAB — LIPASE, BLOOD: Lipase: 15 U/L — ABNORMAL LOW (ref 22–51)

## 2014-12-11 MED ORDER — DOCUSATE SODIUM 100 MG PO CAPS
100.0000 mg | ORAL_CAPSULE | Freq: Two times a day (BID) | ORAL | Status: DC
  Administered 2014-12-11 – 2014-12-13 (×3): 100 mg via ORAL
  Filled 2014-12-11 (×3): qty 1

## 2014-12-11 MED ORDER — ENOXAPARIN SODIUM 80 MG/0.8ML ~~LOC~~ SOLN
0.5000 mg/kg | SUBCUTANEOUS | Status: DC
  Administered 2014-12-12 – 2014-12-13 (×2): 70 mg via SUBCUTANEOUS
  Filled 2014-12-11 (×2): qty 0.8

## 2014-12-11 MED ORDER — ONDANSETRON HCL 4 MG/2ML IJ SOLN
4.0000 mg | Freq: Four times a day (QID) | INTRAMUSCULAR | Status: DC | PRN
  Administered 2014-12-11: 4 mg via INTRAVENOUS
  Filled 2014-12-11: qty 2

## 2014-12-11 MED ORDER — PIPERACILLIN-TAZOBACTAM 3.375 G IVPB
3.3750 g | Freq: Three times a day (TID) | INTRAVENOUS | Status: DC
  Administered 2014-12-11 – 2014-12-13 (×5): 3.375 g via INTRAVENOUS
  Filled 2014-12-11 (×9): qty 50

## 2014-12-11 MED ORDER — HYDROMORPHONE HCL 1 MG/ML IJ SOLN
1.0000 mg | Freq: Once | INTRAMUSCULAR | Status: AC
  Administered 2014-12-11: 1 mg via INTRAVENOUS
  Filled 2014-12-11: qty 1

## 2014-12-11 MED ORDER — SODIUM CHLORIDE 0.9 % IJ SOLN
3.0000 mL | Freq: Two times a day (BID) | INTRAMUSCULAR | Status: DC
  Administered 2014-12-11: 3 mL via INTRAVENOUS

## 2014-12-11 MED ORDER — SODIUM CHLORIDE 0.9 % IV SOLN
INTRAVENOUS | Status: DC
  Administered 2014-12-11 – 2014-12-13 (×2): via INTRAVENOUS

## 2014-12-11 MED ORDER — METOPROLOL SUCCINATE ER 50 MG PO TB24
50.0000 mg | ORAL_TABLET | Freq: Every day | ORAL | Status: DC
  Administered 2014-12-13: 50 mg via ORAL
  Filled 2014-12-11: qty 1

## 2014-12-11 MED ORDER — ACETAMINOPHEN 650 MG RE SUPP: 650.0000 mg | Freq: Four times a day (QID) | RECTAL | Status: DC | PRN

## 2014-12-11 MED ORDER — ACETAMINOPHEN 500 MG PO TABS: 1000.0000 mg | ORAL_TABLET | ORAL | Status: DC | PRN

## 2014-12-11 MED ORDER — POLYETHYLENE GLYCOL 3350 17 G PO PACK
17.0000 g | PACK | Freq: Every day | ORAL | Status: DC | PRN
  Administered 2014-12-12: 17 g via ORAL
  Filled 2014-12-11: qty 1

## 2014-12-11 MED ORDER — ONDANSETRON HCL 4 MG PO TABS: 4.0000 mg | ORAL_TABLET | Freq: Four times a day (QID) | ORAL | Status: DC | PRN

## 2014-12-11 MED ORDER — FAMOTIDINE IN NACL 20-0.9 MG/50ML-% IV SOLN
20.0000 mg | Freq: Once | INTRAVENOUS | Status: AC
  Administered 2014-12-11: 20 mg via INTRAVENOUS
  Filled 2014-12-11: qty 50

## 2014-12-11 MED ORDER — ACETAMINOPHEN 325 MG PO TABS
650.0000 mg | ORAL_TABLET | Freq: Four times a day (QID) | ORAL | Status: DC | PRN
  Administered 2014-12-11: 650 mg via ORAL
  Filled 2014-12-11: qty 2

## 2014-12-11 MED ORDER — SINCALIDE 5 MCG IJ SOLR
INTRAMUSCULAR | Status: AC
  Administered 2014-12-11: 2.7 ug via INTRAVENOUS
  Filled 2014-12-11: qty 5

## 2014-12-11 MED ORDER — SINCALIDE 5 MCG IJ SOLR
0.0200 ug/kg | Freq: Once | INTRAMUSCULAR | Status: AC
  Administered 2014-12-11: 2.7 ug via INTRAVENOUS

## 2014-12-11 MED ORDER — TECHNETIUM TC 99M MEBROFENIN IV KIT
5.0000 | PACK | Freq: Once | INTRAVENOUS | Status: DC | PRN
  Administered 2014-12-11: 5 via INTRAVENOUS
  Filled 2014-12-11: qty 6

## 2014-12-11 MED ORDER — DEXTROSE-NACL 5-0.45 % IV SOLN: INTRAVENOUS | Status: DC

## 2014-12-11 MED ORDER — TRAMADOL HCL 50 MG PO TABS
50.0000 mg | ORAL_TABLET | Freq: Four times a day (QID) | ORAL | Status: DC | PRN
Start: 2014-12-11 — End: 2014-12-13
  Administered 2014-12-11 – 2014-12-12 (×2): 50 mg via ORAL
  Filled 2014-12-11 (×2): qty 1

## 2014-12-11 MED ORDER — STERILE WATER FOR INJECTION IJ SOLN
INTRAMUSCULAR | Status: AC
  Filled 2014-12-11: qty 10

## 2014-12-11 NOTE — H&P (Signed)
[facility] History and Physical  Lori Popowski BJY:[id number] DOB: 1965-11-21 DOA: 12/11/2014  Referring physician:    Emergency Department PCP: Sanda Linger, MD   Chief Complaint: abdominal pain   HPI: [name] is a 49 y.o. female who developed acute epigastric pain and nausea while at work around 11:00 last night. The epigastric pain radiated upward into her chest and then began radiating around both sides of her upper abdomen. Patient has never had this type of pain before. It came on suddenly, severe at onset and constant in nature. The pain eased off when given pain medications by EMS. Pain later recurred requiring more medication. Currently epigastric pain has subsided the patient did have one episode of nonbloody emesis a few minutes ago. No NSAID use at home. No history peptic ulcer disease. Patient is on a daily PPI. She does report a fever of 99.6 last night, no chills.   ED pertinent findings / treatment:   WBC , hgb 12.4. troponin 0.0, lipase normal, AST 264, ALT 70. CXR reveals vascular congestion.   U/S revealed a contracted gallbladder but otherwise grossly unremarkable. Epigastric pain better with Dilaudid but now she has a headache.    Review of Systems:  Constitutional:  No weight loss, night sweats,  chills, fatigue.  HEENT:  + headaches, difficulty swallowing, tooth/dental problems, sore throat,  sneezing, itching, ear ache, nasal congestion, post nasal drip Cardio-vascular:  No, Orthopnea, PND, swelling in lower extremities, anasarca, dizziness, palpitations  GI:  No heartburn, indigestion, diarrhea, change in bowel habits, loss of appetite  Resp:  No shortness of breath with exertion or at rest. No excess mucus, no productive cough, no non-productive cough, coughing up of blood. No change in color of mucus. No wheezing. No chest wall deformity  Skin:  no rash or lesions.  GU:  no dysuria, change in color of urine, urgency or frequency. No  flank pain.  Musculoskeletal:  No joint pain or swelling. No decreased range of motion. No back pain.  Psych:  No change in mood or affect. No depression or anxiety. No memory loss.   Past Medical History  Diagnosis Date  . Hypertension   . Sleep apnea        . GERD (gastroesophageal reflux disease)   . Arthritis   . Morbid obesity (H[facility])   . Hiatal hernia   . Migraines    Past Surgical History  Procedure Laterality Date  . Abdominal hysterectomy    . Cardiovascular stress test  07/25/2007    Negative stress echo; ef 55-60%  . Knee arthroscopy  01/2011    left  . Esophageal manometry  03/07/2012    Procedure: ESOPHAGEAL MANOMETRY (EM);  Surgeon: Mardella Layman, MD;  Location: [facility];  Service: Endoscopy;  Laterality: N/A;  . Knee surgery    . Abdominal hysterectomy  2003   Social History:  reports that she has never smoked. She has never used smokeless tobacco. She reports that she does not drink alcohol or use illicit drugs.  Lives:  At home With:  Husband Assistive Devices:  none  Allergies  Allergen Reactions  . Oxycodone Shortness Of Breath    Family History  Problem Relation Age of Onset  . Cervical cancer Mother   . Hypertension Sister   . Diabetes Sister   . Heart disease Neg Hx   . Cancer Neg Hx   . Alcohol abuse Neg Hx   . Early death Neg Hx   . Hearing loss Neg Hx   .  Hyperlipidemia Neg Hx   . Kidney disease Neg Hx   . Stroke Neg Hx    *  Prior to Admission medications   Medication Sig Start Date End Date Taking? Authorizing Provider  acetaminophen (TYLENOL) 500 MG tablet Take 1,000 mg by mouth every 6 (six) hours as needed. For pain   Yes Historical Provider, MD  metoprolol succinate (TOPROL-XL) 50 MG 24 hr tablet Take 1 tablet (50 mg total) by mouth daily. 03/14/14  Yes [name], MD   Physical Exam: Filed Vitals:   12/11/14 0453 12/11/14 0531 12/11/14 0630 12/11/14 0711  BP: 93/47 127/53 101/56 104/55  Pulse: 68 72 75 74  Temp:       TempSrc:      Resp: 18   18  Height:      Weight:      SpO2: 97% 96% 98% 96%    Wt Readings from Last 3 Encounters:  12/11/14 136.079 kg (300 lb)  10/15/14 140.615 kg (310 lb)  08/10/14 139.708 kg (308 lb)    General:  Pleasant obese black female. Appears calm and comfortable Eyes: PER, normal lids, irises & conjunctiva ENT: grossly normal hearing, lips & tongue Neck: no LAD, masses  Cardiovascular: RRR, no m/r/g. No LE edema. Respiratory: CTA bilaterally, no w/r/r. Normal respiratory effort. Abdomen: soft, obese, moderate epigastric tenderness.Bowel sounds present. No masses appreciated.  Skin: no rash or induration seen on limited exam Musculoskeletal: grossly normal tone BUE/BLE Psychiatric: grossly normal mood and affect, speech fluent and appropriate Neurologic: grossly non-focal.         Labs on Admission:  Basic Metabolic Panel:  Recent Labs Lab 12/11/14 0223  NA 138  K 3.5  CL 105  CO2 24  GLUCOSE 112*  BUN 13  CREATININE 0.73  CALCIUM 8.8*   Liver Function Tests:  Recent Labs Lab 12/11/14 0249  AST 264*  ALT 70*  ALKPHOS 108  BILITOT 0.6  PROT 6.7  ALBUMIN 3.2*    Recent Labs Lab 12/11/14 0249  LIPASE 15*    CBC:  Recent Labs Lab 12/11/14 0223  WBC 13.2*  HGB 12.4  HCT 39.3  MCV 86.4  PLT 251    Radiological Exams on Admission: Dg Chest 2 View  12/11/2014   CLINICAL DATA:  Acute onset of shortness of breath and epigastric abdominal pain. Nausea. Initial encounter.  EXAM: CHEST  2 VIEW  COMPARISON:  Chest radiograph performed 01/27/2013  FINDINGS: The lungs are well-aerated. Vascular congestion is noted. Mild bibasilar atelectasis is noted. There is no evidence of pleural effusion or pneumothorax.  The heart is borderline normal in size. No acute osseous abnormalities are seen.  IMPRESSION: Vascular congestion noted.  Mild bibasilar atelectasis seen.   Electronically Signed   By: Roanna Raider M.D.   On: 12/11/2014 02:48   US  Abdomen Limited  12/11/2014   CLINICAL DATA:  Acute onset of epigastric abdominal pain, radiating to the chest. Initial encounter.  EXAM: US ABDOMEN LIMITED - RIGHT UPPER QUADRANT  COMPARISON:  CT of the abdomen and pelvis performed 11/08/2013, and abdominal ultrasound performed 02/01/2013  FINDINGS: Gallbladder:  Contracted and difficult to fully assess. No pericholecystic fluid seen. No ultrasonographic Murphy's sign elicited. No definite stones identified.  Common bile duct:  Diameter: 0.6 cm, within normal limits in caliber.  Liver:  No focal lesion identified. Within normal limits in parenchymal echogenicity, though parenchymal echotexture is somewhat coarsened.  IMPRESSION: Gallbladder contracted and grossly unremarkable. No acute abnormality seen at the  right upper quadrant.   Electronically Signed   By: Roanna Raider M.D.   On: 12/11/2014 04:45    EKG:  Sinus rhythm Borderline T wave abnormalities No significant change since last tracing QT Interval:352 QTC Calculation:438   ASSESSMENT / PLAN:      Epigastric abdominal pain. Acute onset epigastric pain initially radiating upward chest then around both sides of upper abdomen. Transaminases elevated, no suggestion of acute cholecystitis on ultrasound. No cholelithiasis.   Admit to telemetry. Obtain troponins given that pain did radiate up into the chest, patient is obese with hypertension.  Given low-grade temperature, leukocytosis and upper abdominal pain still concerned about cholecystitis.   Will obtain HIDA scan.   surgical consult.   Leukocytosis. Low-grade temp at home, afebrile today.  Transaminitis. Etiology? No new home meds. No significant ETOH use. No cholelithiasis or biliary duct dilation on u/s. Rule out gallbladder disease. Obtain HIDA. Recheck am LFTs. Surgery consulted-spoke with Emina R.- P.A-C.   Essential hypertension, benign. BP stable. Continue home Metoprolol  Morbid obesity with BMI of 50.0-59.9. She  is trying to lose weight.   GERD. Stable.  Reflux esophagitis on EGD July 2013. Continue home PPI.    Code Status: full code DVT Prophylaxis: lovenox Family Communication: Patient alert, oriented and understands plan of care.  Disposition Plan:  Discharge to home in 24-48 hours.   Time spent: 60 minutes  Willette Cluster , NP [facility] Pager [phone]

## 2014-12-11 NOTE — ED Notes (Signed)
Report called to 5W patient transported via wheelchair

## 2014-12-11 NOTE — Consult Note (Signed)
Reason for Consult: abdominal pain Referring Physician: Dr. Reyne Dumas     HPI: [name] is a 49 year old female with a history of hypertension and obesity presenting with sudden onset epigastric abdominal pain. Started around 11:30PM after she got to her work.  Radiated up to her chest and across her upper abdomen.  Time pattern is constant.  Severe is severity. Characterized as sharp stabbing pain.  She thought she was dying and called EMS.  She recalls having mac n cheese and barbeque for dinner earlier that night.  She denies previous symptoms.  Associated with nausea and vomiting.  Denies fever, chills or sweats. Denies diaphoresis or shortness of breath.  Modifying factors include mylanta.  No aggravating factors.  Her pain was alleviated by pain meds which was given in the ambulance.  She reports a history of a gastric ulcer several years ago seen on endoscopy done by Dr. Sharlett Iles.  Denies significant NSAID use, take 1-2 ibuprofens once weekly.  Her work up shows elevated ast/alt, normal lipase WBC 13.2k.  Normal T-bilirubin.  Abdominal US showed a contracted gallbladder, no definitive stones, no pericholecystic fluid or wall thickening.   At present time, symptoms have significant improved.   Past Medical History  Diagnosis Date  . Hypertension   . Sleep apnea        . GERD (gastroesophageal reflux disease)   . Arthritis   . Morbid obesity ([place])   . Hiatal hernia   . Migraines     Past Surgical History  Procedure Laterality Date  . Abdominal hysterectomy    . Cardiovascular stress test  07/25/2007    Negative stress echo; ef 55-60%  . Knee arthroscopy  01/2011    left  . Esophageal manometry  03/07/2012    Procedure: ESOPHAGEAL MANOMETRY (EM);  Surgeon: Sable Feil, MD;  Location: [facility];  Service: Endoscopy;  Laterality: N/A;  . Knee surgery    . Abdominal hysterectomy  2003    Family History  Problem Relation Age of Onset  . Cervical cancer Mother    . Hypertension Sister   . Diabetes Sister   . Heart disease Neg Hx   . Cancer Neg Hx   . Alcohol abuse Neg Hx   . Early death Neg Hx   . Hearing loss Neg Hx   . Hyperlipidemia Neg Hx   . Kidney disease Neg Hx   . Stroke Neg Hx     Social History:  reports that she has never smoked. She has never used smokeless tobacco. She reports that she does not drink alcohol or use illicit drugs.  Allergies:  Allergies  Allergen Reactions  . Oxycodone Shortness Of Breath    Medications:  No current facility-administered medications on file prior to encounter.   Current Outpatient Prescriptions on File Prior to Encounter  Medication Sig Dispense Refill  . acetaminophen (TYLENOL) 500 MG tablet Take 1,000 mg by mouth every 6 (six) hours as needed. For pain    . metoprolol succinate (TOPROL-XL) 50 MG 24 hr tablet Take 1 tablet (50 mg total) by mouth daily. 90 tablet 2  . [DISCONTINUED] dexlansoprazole (DEXILANT) 60 MG capsule Take 1 capsule (60 mg total) by mouth daily. 30 capsule 11  . [DISCONTINUED] diphenhydrAMINE (BENADRYL) 50 MG capsule Take 50 mg by mouth every 6 (six) hours as needed.    . [DISCONTINUED] diphenhydramine-acetaminophen (TYLENOL PM) 25-500 MG TABS Take 1 tablet by mouth at bedtime as needed. For pain  Results for orders placed or performed during the hospital encounter of 12/11/14 (from the past 48 hour(s))  Basic metabolic panel     Status: Abnormal   Collection Time: 12/11/14  2:23 AM  Result Value Ref Range   Sodium 138 135 - 145 mmol/L   Potassium 3.5 3.5 - 5.1 mmol/L   Chloride 105 101 - 111 mmol/L   CO2 24 22 - 32 mmol/L   Glucose, Bld 112 (H) 65 - 99 mg/dL   BUN 13 6 - 20 mg/dL   Creatinine, Ser 0.73 0.44 - 1.00 mg/dL   Calcium 8.8 (L) 8.9 - 10.3 mg/dL   GFR calc non Af Amer >60 >60 mL/min   GFR calc Af Amer >60 >60 mL/min    Comment: (NOTE) The eGFR has been calculated using the CKD EPI equation. This calculation has not been validated in all  clinical situations. eGFR's persistently <60 mL/min signify possible Chronic Kidney Disease.    Anion gap 9 5 - 15  CBC     Status: Abnormal   Collection Time: 12/11/14  2:23 AM  Result Value Ref Range   WBC 13.2 (H) 4.0 - 10.5 K/uL   RBC 4.55 3.87 - 5.11 MIL/uL   Hemoglobin 12.4 12.0 - 15.0 g/dL   HCT 39.3 36.0 - 46.0 %   MCV 86.4 78.0 - 100.0 fL   MCH 27.3 26.0 - 34.0 pg   MCHC 31.6 30.0 - 36.0 g/dL   RDW 14.5 11.5 - 15.5 %   Platelets 251 150 - 400 K/uL  I-stat troponin, ED     Status: None   Collection Time: 12/11/14  2:26 AM  Result Value Ref Range   Troponin i, poc 0.00 0.00 - 0.08 ng/mL   Comment 3            Comment: Due to the release kinetics of cTnI, a negative result within the first hours of the onset of symptoms does not rule out myocardial infarction with certainty. If myocardial infarction is still suspected, repeat the test at appropriate intervals.   Hepatic function panel     Status: Abnormal   Collection Time: 12/11/14  2:49 AM  Result Value Ref Range   Total Protein 6.7 6.5 - 8.1 g/dL   Albumin 3.2 (L) 3.5 - 5.0 g/dL   AST 264 (H) 15 - 41 U/L   ALT 70 (H) 14 - 54 U/L   Alkaline Phosphatase 108 38 - 126 U/L   Total Bilirubin 0.6 0.3 - 1.2 mg/dL   Bilirubin, Direct 0.2 0.1 - 0.5 mg/dL   Indirect Bilirubin 0.4 0.3 - 0.9 mg/dL  Lipase, blood     Status: Abnormal   Collection Time: 12/11/14  2:49 AM  Result Value Ref Range   Lipase 15 (L) 22 - 51 U/L    Dg Chest 2 View  12/11/2014   CLINICAL DATA:  Acute onset of shortness of breath and epigastric abdominal pain. Nausea. Initial encounter.  EXAM: CHEST  2 VIEW  COMPARISON:  Chest radiograph performed 01/27/2013  FINDINGS: The lungs are well-aerated. Vascular congestion is noted. Mild bibasilar atelectasis is noted. There is no evidence of pleural effusion or pneumothorax.  The heart is borderline normal in size. No acute osseous abnormalities are seen.  IMPRESSION: Vascular congestion noted.  Mild  bibasilar atelectasis seen.   Electronically Signed   By: Garald Balding M.D.   On: 12/11/2014 02:48   US Abdomen Limited  12/11/2014   CLINICAL DATA:  Acute onset of  epigastric abdominal pain, radiating to the chest. Initial encounter.  EXAM: US ABDOMEN LIMITED - RIGHT UPPER QUADRANT  COMPARISON:  CT of the abdomen and pelvis performed 11/08/2013, and abdominal ultrasound performed 02/01/2013  FINDINGS: Gallbladder:  Contracted and difficult to fully assess. No pericholecystic fluid seen. No ultrasonographic Murphy's sign elicited. No definite stones identified.  Common bile duct:  Diameter: 0.6 cm, within normal limits in caliber.  Liver:  No focal lesion identified. Within normal limits in parenchymal echogenicity, though parenchymal echotexture is somewhat coarsened.  IMPRESSION: Gallbladder contracted and grossly unremarkable. No acute abnormality seen at the right upper quadrant.   Electronically Signed   By: Garald Balding M.D.   On: 12/11/2014 04:45    Review of Systems  All other systems reviewed and are negative.  Blood pressure 109/51, pulse 80, temperature 97.9 F (36.6 C), temperature source Oral, resp. rate 20, height 5' 5"  (1.651 m), weight 137.079 kg (302 lb 3.3 oz), SpO2 92 %. Physical Exam  Constitutional: She is oriented to person, place, and time. She appears well-developed and well-nourished. No distress.  Cardiovascular: Normal rate, regular rhythm, normal heart sounds and intact distal pulses.  Exam reveals no gallop and no friction rub.   No murmur heard. Respiratory: Effort normal and breath sounds normal. No respiratory distress. She has no wheezes. She has no rales. She exhibits no tenderness.  GI: Soft. Bowel sounds are normal. She exhibits no distension and no mass. There is no rebound and no guarding.  ttp epigastric region, RUQ.  Musculoskeletal: Normal range of motion. She exhibits no edema or tenderness.  Neurological: She is alert and oriented to person, place,  and time.  Skin: Skin is warm and dry. No rash noted. She is not diaphoretic. No erythema. No pallor.  Psychiatric: She has a normal mood and affect. Her behavior is normal. Judgment and thought content normal.    Assessment/Plan: Epigastric abdominal pain-US did not visualize stones, wall thickening and the gallbladder is contracted.  Agree with HIDA scan which is more definitive.  I suppose she can have chronic cholecystitis or biliary dyskinesia, however, she has not had any post prandial symptoms in the past.  Thank you for the consult, surgery will follow along.   Erby Pian ANP-BC Pager [phone] 12/11/2014, 10:17 AM

## 2014-12-11 NOTE — Care Management Note (Signed)
Case Management Note  Patient Details  Name: [name] MRN: [id number] Date of Birth: April 18, 1965  Subjective/Objective:                  Date-12-11-14 Tuesday Initial Assessment . Spoke with patient at the bedside.  Introduced self as Sports coach and explained role in discharge planning and how to be reached.  Verified patient lives at [address] [phone].  Verified patient anticipates to go home with  Family at time of discharge and will have  part-time supervision by family  at this time to best of their knowledge.  Patient has no DME. Expressed potential need for no other DME.  Patient denied   needing help with their medication.  Patient drivesto MD appointments.  Verified patient has PCP Dr Yetta Barre at [place].   Plan: CM will continue to follow for discharge planning and [facility] resources.   Lawerance Sabal RN BSN CM [phone]   Action/Plan:   Expected Discharge Date:                  Expected Discharge Plan:  Home/Self Care  In-House Referral:     Discharge planning Services  CM Consult  Post Acute Care Choice:    Choice offered to:     DME Arranged:    DME Agency:     HH Arranged:    HH Agency:     Status of Service:  In process, will continue to follow  Medicare Important Message Given:    Date Medicare IM Given:    Medicare IM give by:    Date Additional Medicare IM Given:    Additional Medicare Important Message give by:     If discussed at Long Length of Stay Meetings, dates discussed:    Additional Comments:  Lawerance Sabal, RN 12/11/2014, 11:20 AM

## 2014-12-11 NOTE — ED Notes (Signed)
Pt presents to ED via [facility] for epigastric pain that radiates to the chest with tachypnea and nausea; pt was given  ASA,  zofran, and fentanyl PTA; pt reports pain now 8/10; pt reports she was a work when the pain came on suddenly

## 2014-12-11 NOTE — Progress Notes (Signed)
Pt returned from [facility]. Zosyn not run while in [employer]. Notified pharm. Next dose due 0300

## 2014-12-11 NOTE — ED Provider Notes (Signed)
CSN: [id number]   Arrival date & time 12/11/14 0149  History  By signing my name below, I, Bethel Born, attest that this documentation has been prepared under the direction and in the presence of Derwood Kaplan, MD. Electronically Signed: Bethel Born, ED Scribe. 12/11/2014. 2:54 AM.  Chief Complaint  Patient presents with  . Abdominal Pain  . Chest Pain    HPI The history is provided by the patient. No language interpreter was used.   [name] is a 49 y.o. female with PMHx of GERD, HTN, and obesity who presents to the Emergency Department complaining of new and constant epigastric pain with sudden onset last night at work. The pain radiates bilaterally across the abdomen but not to the back. She describes the pain as aching/throbbing and rates it 8/10 in severity. Her pain improved after fentanyl by EMS.  Last bowel movement was yesterday and normal. Associated symptoms include nausea. Pt denies vomiting. No history of heavy alcohol use or pancreatitis. No history of COPD or asthma. PSHx includes hysterectomy. No history of cholecystectomy.   Past Medical History  Diagnosis Date  . Hypertension   . Sleep apnea        . GERD (gastroesophageal reflux disease)   . Arthritis   . Morbid obesity (HCC)   . Hiatal hernia   . Migraines     Past Surgical History  Procedure Laterality Date  . Abdominal hysterectomy    . Cardiovascular stress test  07/25/2007    Negative stress echo; ef 55-60%  . Knee arthroscopy  01/2011    left  . Esophageal manometry  03/07/2012    Procedure: ESOPHAGEAL MANOMETRY (EM);  Surgeon: Mardella Layman, MD;  Location: [facility];  Service: Endoscopy;  Laterality: N/A;  . Knee surgery    . Abdominal hysterectomy  2003    Family History  Problem Relation Age of Onset  . Cervical cancer Mother   . Hypertension Sister   . Diabetes Sister   . Heart disease Neg Hx   . Cancer Neg Hx   . Alcohol abuse Neg Hx   . Early death Neg Hx   .  Hearing loss Neg Hx   . Hyperlipidemia Neg Hx   . Kidney disease Neg Hx   . Stroke Neg Hx     Social History  Substance Use Topics  . Smoking status: Never Smoker   . Smokeless tobacco: Never Used  . Alcohol Use: No     Review of Systems  Constitutional: Negative for fever and chills.  Cardiovascular: Positive for chest pain.  Gastrointestinal: Positive for nausea and abdominal pain. Negative for vomiting.  Neurological: Negative for weakness.  All other systems reviewed and are negative.  Home Medications   Prior to Admission medications   Medication Sig Start Date End Date Taking? Authorizing Provider  acetaminophen (TYLENOL) 500 MG tablet Take 1,000 mg by mouth every 6 (six) hours as needed. For pain   Yes Historical Provider, MD  metoprolol succinate (TOPROL-XL) 50 MG 24 hr tablet Take 1 tablet (50 mg total) by mouth daily. 03/14/14  Yes [name], MD    Allergies  Oxycodone  Triage Vitals: BP 123/62 mmHg  Pulse 87  Temp(Src) 97.9 F (36.6 C) (Oral)  Resp 46  Ht 5\' 5"  (1.651 m)  Wt 300 lb (136.079 kg)  BMI 49.92 kg/m2  SpO2 99%  Physical Exam  Constitutional: She is oriented to person, place, and time. She appears well-developed and well-nourished. No distress.  HENT:  Head: Normocephalic and atraumatic.  Neck: Normal range of motion. No JVD present.  Cardiovascular: Normal rate and regular rhythm.   Pulmonary/Chest: Effort normal and breath sounds normal. No respiratory distress.  Lungs clear to anterior auscultation  Abdominal: Soft. Bowel sounds are normal. There is tenderness. There is guarding.  Pt has epigastric and upper quadrant TTP with voluntary guarding and +Murphy's sign  Musculoskeletal: Normal range of motion.  Neurological: She is alert and oriented to person, place, and time.  Skin: Skin is warm and dry. She is not diaphoretic.  Psychiatric: She has a normal mood and affect.  Nursing note and vitals reviewed.   ED Course   Procedures   DIAGNOSTIC STUDIES: Oxygen Saturation is 99% on RA, normal by my interpretation.    COORDINATION OF CARE: 2:49 AM Discussed treatment plan which includes lab work, CXR, EKG, abdominal US, and pain management with pt at bedside and pt agreed to plan.  Labs Reviewed  BASIC METABOLIC PANEL - Abnormal; Notable for the following:    Glucose, Bld 112 (*)    Calcium 8.8 (*)    All other components within normal limits  CBC - Abnormal; Notable for the following:    WBC 13.2 (*)    All other components within normal limits  HEPATIC FUNCTION PANEL - Abnormal; Notable for the following:    Albumin 3.2 (*)    AST 264 (*)    ALT 70 (*)    All other components within normal limits  LIPASE, BLOOD - Abnormal; Notable for the following:    Lipase 15 (*)    All other components within normal limits  I-STAT TROPOININ, ED    Imaging Review Dg Chest 2 View  12/11/2014   CLINICAL DATA:  Acute onset of shortness of breath and epigastric abdominal pain. Nausea. Initial encounter.  EXAM: CHEST  2 VIEW  COMPARISON:  Chest radiograph performed 01/27/2013  FINDINGS: The lungs are well-aerated. Vascular congestion is noted. Mild bibasilar atelectasis is noted. There is no evidence of pleural effusion or pneumothorax.  The heart is borderline normal in size. No acute osseous abnormalities are seen.  IMPRESSION: Vascular congestion noted.  Mild bibasilar atelectasis seen.   Electronically Signed   By: Roanna Raider M.D.   On: 12/11/2014 02:48   US Abdomen Limited  12/11/2014   CLINICAL DATA:  Acute onset of epigastric abdominal pain, radiating to the chest. Initial encounter.  EXAM: US ABDOMEN LIMITED - RIGHT UPPER QUADRANT  COMPARISON:  CT of the abdomen and pelvis performed 11/08/2013, and abdominal ultrasound performed 02/01/2013  FINDINGS: Gallbladder:  Contracted and difficult to fully assess. No pericholecystic fluid seen. No ultrasonographic Murphy's sign elicited. No definite stones  identified.  Common bile duct:  Diameter: 0.6 cm, within normal limits in caliber.  Liver:  No focal lesion identified. Within normal limits in parenchymal echogenicity, though parenchymal echotexture is somewhat coarsened.  IMPRESSION: Gallbladder contracted and grossly unremarkable. No acute abnormality seen at the right upper quadrant.   Electronically Signed   By: Roanna Raider M.D.   On: 12/11/2014 04:45    EKG Interpretation  Date/Time:  Tuesday December 11 2014 01:56:32 EDT Ventricular Rate:  93 PR Interval:  146 QRS Duration: 78 QT Interval:  352 QTC Calculation: 438 R Axis:   55 Text Interpretation:  Sinus rhythm Borderline T wave abnormalities No significant change since last tracing Confirmed by Rhunette Croft, MD, Eileen Croswell [id number]) on 12/11/2014 2:10:28 AM    MDM   Final diagnoses:  Epigastric pain  Pt comes in with cc of abd pain. DDx includes: Pancreatitis Hepatobiliary pathology including cholecystitis Gastritis/PUD SBO ACS syndrome Aortic Dissection   Based on the exam and eval - i think hepatobliary process more likely. [place] ordered and is neg.  Repeat exam - tenderness wi still severe. Med admission requested so that she can get HIDA scan.   Derwood Kaplan, MD 12/11/14 [phone]

## 2014-12-12 ENCOUNTER — Observation Stay (HOSPITAL_COMMUNITY): Payer: BLUE CROSS/BLUE SHIELD

## 2014-12-12 DIAGNOSIS — I10 Essential (primary) hypertension: Secondary | ICD-10-CM | POA: Diagnosis not present

## 2014-12-12 DIAGNOSIS — R1013 Epigastric pain: Secondary | ICD-10-CM | POA: Diagnosis not present

## 2014-12-12 DIAGNOSIS — D72829 Elevated white blood cell count, unspecified: Secondary | ICD-10-CM | POA: Diagnosis not present

## 2014-12-12 DIAGNOSIS — R7989 Other specified abnormal findings of blood chemistry: Secondary | ICD-10-CM | POA: Diagnosis not present

## 2014-12-12 DIAGNOSIS — R740 Nonspecific elevation of levels of transaminase and lactic acid dehydrogenase [LDH]: Secondary | ICD-10-CM

## 2014-12-12 DIAGNOSIS — Z6841 Body Mass Index (BMI) 40.0 and over, adult: Secondary | ICD-10-CM

## 2014-12-12 LAB — COMPREHENSIVE METABOLIC PANEL
ALBUMIN: 3.4 g/dL — AB (ref 3.5–5.0)
ALK PHOS: 186 U/L — AB (ref 38–126)
ALT: 347 U/L — ABNORMAL HIGH (ref 14–54)
ANION GAP: 10 (ref 5–15)
AST: 405 U/L — ABNORMAL HIGH (ref 15–41)
BILIRUBIN TOTAL: 1 mg/dL (ref 0.3–1.2)
BUN: 6 mg/dL (ref 6–20)
CALCIUM: 8.8 mg/dL — AB (ref 8.9–10.3)
CO2: 26 mmol/L (ref 22–32)
Chloride: 103 mmol/L (ref 101–111)
Creatinine, Ser: 0.66 mg/dL (ref 0.44–1.00)
GFR calc non Af Amer: >60 mL/min (ref >60)
GLUCOSE: 82 mg/dL (ref 65–99)
POTASSIUM: 3.6 mmol/L (ref 3.5–5.1)
SODIUM: 139 mmol/L (ref 135–145)
TOTAL PROTEIN: 7.4 g/dL (ref 6.5–8.1)

## 2014-12-12 MED ORDER — GADOBENATE DIMEGLUMINE 529 MG/ML IV SOLN
20.0000 mL | Freq: Once | INTRAVENOUS | Status: AC | PRN
  Administered 2014-12-12: 20 mL via INTRAVENOUS

## 2014-12-12 MED ORDER — KETOROLAC TROMETHAMINE 15 MG/ML IJ SOLN
15.0000 mg | Freq: Three times a day (TID) | INTRAMUSCULAR | Status: DC | PRN
  Administered 2014-12-12: 15 mg via INTRAVENOUS
  Filled 2014-12-12: qty 1

## 2014-12-12 NOTE — Progress Notes (Signed)
PROGRESS NOTE  [name] YQM:[id number] DOB: Dec 16, 1965 DOA: 12/11/2014 PCP: Sanda Linger, MD  HPI/Recap of past 24 hours: Right upper quadrant pain resolved, no n/v, last bm Monday, npo awaiting mrcp. C/o migraine headache,   Assessment/Plan: Principal Problem:   Epigastric abdominal pain Active Problems:   Essential hypertension, benign   Morbid obesity with BMI of 50.0-59.9, adult (HCC)   Leukocytosis   GERD (gastroesophageal reflux disease)   Transaminitis   Abdominal pain, epigastric   Epigastric pain  Epigastric abdominal pain.  Acute onset epigastric pain initially radiating upward chest then around both sides of upper abdomen. Transaminases elevated, no suggestion of acute cholecystitis on ultrasound. No cholelithiasis.   Given low-grade temperature, leukocytosis and upper abdominal pain, she was started on abx empirically, will continue for now  negtaive HIDA scan. surgical signed off, Gi consulted, awaiting MRCP.   Leukocytosis. Low-grade temp at home, afebrile today.  Transaminitis.  Etiology? No new home meds. No significant ETOH use. No cholelithiasis or biliary duct dilation on u/s. Rule out gallbladder disease. Negative HIDA. Recheck am LFTs. GI following   Essential hypertension, benign. BP stable. Continue home Metoprolol  Morbid obesity with BMI of 50.0-59.9. She is trying to lose weight.   GERD. Stable. Reflux esophagitis on EGD July 2013. Continue home PPI.   Code Status: full  Family Communication: patient   Disposition Plan: home when medically ready   Consultants:  GI  General surgery  Procedures:  mrcp  Antibiotics:  Zosyn from admission   Objective: BP 138/69 mmHg  Pulse 71  Temp(Src) 98.4 F (36.9 C) (Oral)  Resp 14  Ht  (1.651 m)  Wt 302 lb 3.3 oz (137.079 kg)  BMI 50.29 kg/m2  SpO2 97%  Intake/Output Summary (Last 24 hours) at 12/12/14 1222 Last data filed at 12/11/14 1818  Gross per 24 hour    Intake      0 ml  Output      0 ml  Net      0 ml   Filed Weights   12/11/14 0156 12/11/14 0842  Weight: 300 lb (136.079 kg) 302 lb 3.3 oz (137.079 kg)    Exam:   General:  NAD  Cardiovascular: RRR  Respiratory: CTABL  Abdomen: Soft/ND/NT, positive BS  Musculoskeletal: No Edema  Neuro: aaox3  Data Reviewed: Basic Metabolic Panel:  Recent Labs Lab 12/11/14 0223  NA 138  K 3.5  CL 105  CO2 24  GLUCOSE 112*  BUN 13  CREATININE 0.73  CALCIUM 8.8*   Liver Function Tests:  Recent Labs Lab 12/11/14 0249  AST 264*  ALT 70*  ALKPHOS 108  BILITOT 0.6  PROT 6.7  ALBUMIN 3.2*    Recent Labs Lab 12/11/14 0249  LIPASE 15*   No results for input(s): AMMONIA in the last 168 hours. CBC:  Recent Labs Lab 12/11/14 0223  WBC 13.2*  HGB 12.4  HCT 39.3  MCV 86.4  PLT 251   Cardiac Enzymes:    Recent Labs Lab 12/11/14 1107 12/11/14 1834  TROPONINI <0.03 <0.03   BNP (last 3 results) No results for input(s): BNP in the last 8760 hours.  ProBNP (last 3 results) No results for input(s): PROBNP in the last 8760 hours.  CBG: No results for input(s): GLUCAP in the last 168 hours.  No results found for this or any previous visit (from the past 240 hour(s)).   Studies: Nm Hepatobiliary Including Gb  12/11/2014   CLINICAL DATA:  Epigastric pain and nausea  EXAM: NUCLEAR MEDICINE HEPATOBILIARY IMAGING WITH GALLBLADDER EF  TECHNIQUE: Sequential images of the abdomen were obtained out to 60 minutes following intravenous administration of radiopharmaceutical. After slow intravenous infusion of 2.75 micrograms Cholecystokinin, gallbladder ejection fraction was determined.  RADIOPHARMACEUTICALS:  Five mCi Tc-36m Choletec IV  COMPARISON:  None.  FINDINGS: Gallbladder activity occurs after 60 min. Small bowel activity is faintly visualized after 50 min. There is delayed radiotracer excretion from the liver into the biliary tree. There is no evidence of delayed  uptake in the liver from the blood pool. Gallbladder ejection fraction is 57%. There were no symptoms during the CCK infusion. At 45 min, normal ejection fraction is greater than 40%.  IMPRESSION: Gallbladder ejection fraction is within normal limits.  Cystic and common bile ducts are patent.  There is delayed excretion of radiotracer from the liver. An element of hepatic dysfunction is not excluded. Correlate with liver function tests.   Electronically Signed   By: Jolaine Click M.D.   On: 12/11/2014 18:47    Scheduled Meds: . docusate sodium  100 mg Oral BID  . enoxaparin (LOVENOX) injection  0.5 mg/kg Subcutaneous Q24H  . metoprolol succinate  50 mg Oral Daily  . piperacillin-tazobactam (ZOSYN)  IV  3.375 g Intravenous Q8H  . sodium chloride  3 mL Intravenous Q12H    Continuous Infusions: . sodium chloride 75 mL/hr at 12/11/14 2053     Time spent:  Isami Mehra MD, PhD  [facility] Pager [phone]. If 7PM-7AM, please contact night-coverage at [email or web address], password [facility] 12/12/2014, 12:22 PM

## 2014-12-12 NOTE — Consult Note (Signed)
Gina Domanski EdD 

## 2014-12-12 NOTE — Progress Notes (Signed)
Subjective: DENIES PAIN IN ABDOMEN   Objective: Vital signs in last 24 hours: Temp:  [97.9 F (36.6 C)-98.5 F (36.9 C)] 98.5 F (36.9 C) (10/05 0726) Pulse Rate:  [65-80] 65 (10/05 0726) Resp:  [16-20] 16 (10/05 0726) BP: (102-124)/(49-58) 124/58 mmHg (10/05 0726) SpO2:  [92 %-95 %] 95 % (10/05 0726) Weight:  [137.079 kg (302 lb 3.3 oz)] 137.079 kg (302 lb 3.3 oz) (10/04 0842)    Intake/Output from previous day:   Intake/Output this shift:    GI: soft, non-tender; bowel sounds normal; no masses,  no organomegaly OBESE   Lab Results:   Recent Labs  12/11/14 0223  WBC 13.2*  HGB 12.4  HCT 39.3  PLT 251   BMET  Recent Labs  12/11/14 0223  NA 138  K 3.5  CL 105  CO2 24  GLUCOSE 112*  BUN 13  CREATININE 0.73  CALCIUM 8.8*   PT/INR No results for input(s): LABPROT, INR in the last 72 hours. ABG No results for input(s): PHART, HCO3 in the last 72 hours.  Invalid input(s): PCO2, PO2  Studies/Results: Dg Chest 2 View  12/11/2014   CLINICAL DATA:  Acute onset of shortness of breath and epigastric abdominal pain. Nausea. Initial encounter.  EXAM: CHEST  2 VIEW  COMPARISON:  Chest radiograph performed 01/27/2013  FINDINGS: The lungs are well-aerated. Vascular congestion is noted. Mild bibasilar atelectasis is noted. There is no evidence of pleural effusion or pneumothorax.  The heart is borderline normal in size. No acute osseous abnormalities are seen.  IMPRESSION: Vascular congestion noted.  Mild bibasilar atelectasis seen.   Electronically Signed   By: Roanna Raider M.D.   On: 12/11/2014 02:48   Nm Hepatobiliary Including Gb  12/11/2014   CLINICAL DATA:  Epigastric pain and nausea  EXAM: NUCLEAR MEDICINE HEPATOBILIARY IMAGING WITH GALLBLADDER EF  TECHNIQUE: Sequential images of the abdomen were obtained out to 60 minutes following intravenous administration of radiopharmaceutical. After slow intravenous infusion of 2.75 micrograms Cholecystokinin, gallbladder  ejection fraction was determined.  RADIOPHARMACEUTICALS:  Five mCi Tc-16m Choletec IV  COMPARISON:  None.  FINDINGS: Gallbladder activity occurs after 60 min. Small bowel activity is faintly visualized after 50 min. There is delayed radiotracer excretion from the liver into the biliary tree. There is no evidence of delayed uptake in the liver from the blood pool. Gallbladder ejection fraction is 57%. There were no symptoms during the CCK infusion. At 45 min, normal ejection fraction is greater than 40%.  IMPRESSION: Gallbladder ejection fraction is within normal limits.  Cystic and common bile ducts are patent.  There is delayed excretion of radiotracer from the liver. An element of hepatic dysfunction is not excluded. Correlate with liver function tests.   Electronically Signed   By: Jolaine Click M.D.   On: 12/11/2014 18:47   US Abdomen Limited  12/11/2014   CLINICAL DATA:  Acute onset of epigastric abdominal pain, radiating to the chest. Initial encounter.  EXAM: US ABDOMEN LIMITED - RIGHT UPPER QUADRANT  COMPARISON:  CT of the abdomen and pelvis performed 11/08/2013, and abdominal ultrasound performed 02/01/2013  FINDINGS: Gallbladder:  Contracted and difficult to fully assess. No pericholecystic fluid seen. No ultrasonographic Murphy's sign elicited. No definite stones identified.  Common bile duct:  Diameter: 0.6 cm, within normal limits in caliber.  Liver:  No focal lesion identified. Within normal limits in parenchymal echogenicity, though parenchymal echotexture is somewhat coarsened.  IMPRESSION: Gallbladder contracted and grossly unremarkable. No acute abnormality seen at the right  upper quadrant.   Electronically Signed   By: Roanna Raider M.D.   On: 12/11/2014 04:45    Anti-infectives: Anti-infectives    Start     Dose/Rate Route Frequency Ordered Stop   12/11/14 1015  piperacillin-tazobactam (ZOSYN) IVPB 3.375 g     3.375 g 12.5 mL/hr over 240 Minutes Intravenous Every 8 hours 12/11/14 0956         Assessment/Plan: Patient Active Problem List   Diagnosis Date Noted  . Epigastric abdominal pain 12/11/2014  . Transaminitis 12/11/2014  . Abdominal pain, epigastric 12/11/2014  . Epigastric pain 12/11/2014  . Maxillary sinusitis 08/10/2014  . Obstructive sleep apnea 09/28/2013  . Morbid obesity with BMI of 50.0-59.9, adult (HCC) 06/28/2013  . Esophageal dysmotility 06/28/2013  . Other abnormal glucose 06/28/2013  . Leukocytosis 06/28/2013  . Low back pain radiating to both legs 06/28/2013  . Hip pain, bilateral 06/28/2013  . GERD (gastroesophageal reflux disease) 06/28/2013  . Essential hypertension, benign 11/04/2011  . Routine general medical examination at a health care facility 11/04/2011    NORMAL HIDA AND EF WITH NO PAIN UPON INJECTION OF CCK UNLIKELY AT THIS POINT CHOLECYSTECTOMY HELPFUL RECOMMEND GI CONSULT TO CONSIDER EGD CAN BE DISCHARGED FROM OUR STANDPOINT WITH OUTPATIENT FOLLOW UP       Keonte Daubenspeck A. 12/12/2014

## 2014-12-12 NOTE — Consult Note (Signed)
[facility] Consult: 8:25 AM 12/12/2014     Referring Provider: Dr Roda Shutters Primary Care Physician:  Sanda Linger, MD Primary Gastroenterologist:  Dr. Leone Payor    Reason for Consultation:  Epigastric pain   HPI: [name] is a 49 y.o. female.  Morbidly obese. OSA, not on CPAP as did not tolerate.  Hx knee surgery and hysterectomy.  Work up for GERD starting ~ 2013 with normal EGD.  02/2012 esoph manometry showed dysmotility.    Esophagrams in 2014 and 2015: narrowing at distal esophagus at hiatus "suspect appearance is due to a small hiatal hernia just below the esophageal vestibule".  Pt denies hx of current or past dysphagia.  Has periodic, non-severe breakthrough GERD.  In 06/2013 Dr Leone Payor recommended bariatric surgery evaluation.    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  Boss called EMS and came to ED.  Pain constant, non-radiating.  Somewhat triggered with moving.  Pt unaware of recent heavy lifting, in fact she had just returned to work after 3 days off.  Had eaten chicken and mac/cheese at 6 PM and ate small amount of greens at 10 PM when she took a Tramadol.   Labs show AST/ALT 264/70.  Otherwise LFTs normal.  Lipase 15.  WBCs 13.2.  Historically her LFTs are normal. Troponins normal.  Ultrasound: GB contracted, no stones, CBD 5.1 mm. Liver inhomogeneous sugg of fatty liver.  HIDA scan: delayed contrast emptying from liver but GB EF normal.   Overall pain improved though still present.  She has no nausea.   Does not use excessive acetaminophen.  No new meds in recent weeks. No NSAIDs.  No fatty food intolerance. No previous similar pain.  Dr Luisa Hart, surgeon, feels cholecystectomy unlikely to be helpful and suggested GI  input/possible EGD.    Past Medical History  Diagnosis Date  . Hypertension   . Sleep apnea        . GERD (gastroesophageal reflux disease)   . Arthritis   . Morbid obesity (HCC)   . Hiatal hernia   . Migraines     Past Surgical History  Procedure Laterality Date  . Abdominal hysterectomy    . Cardiovascular stress test  07/25/2007    Negative stress echo; ef 55-60%  . Knee arthroscopy  01/2011    left  . Esophageal manometry  03/07/2012    Procedure: ESOPHAGEAL MANOMETRY (EM);  Surgeon: Mardella Layman, MD;  Location: [facility];  Service: Endoscopy;  Laterality: N/A;  . Knee surgery    . Abdominal hysterectomy  2003    Prior to Admission medications   Medication Sig Start Date End Date Taking? Authorizing Provider  acetaminophen (TYLENOL) 500 MG tablet Take 1,000 mg by mouth every 6 (six) hours as needed. For pain   Yes Historical Provider, MD  metoprolol succinate (TOPROL-XL) 50 MG 24 hr tablet Take 1 tablet (50 mg total) by mouth daily. 03/14/14  Yes [name], MD    Scheduled Meds: . docusate sodium  100  mg Oral BID  . enoxaparin (LOVENOX) injection  0.5 mg/kg Subcutaneous Q24H  . metoprolol succinate  50 mg Oral Daily  . piperacillin-tazobactam (ZOSYN)  IV  3.375 g Intravenous Q8H  . sodium chloride  3 mL Intravenous Q12H   Infusions: . sodium chloride 75 mL/hr at 12/11/14 2053   PRN Meds: acetaminophen **OR** acetaminophen, ondansetron **OR** ondansetron (ZOFRAN) IV, polyethylene glycol, technetium TC 52M mebrofenin, traMADol   Allergies as of 12/11/2014 - Review Complete 12/11/2014  Allergen Reaction Noted  . Oxycodone Shortness Of Breath 01/14/2011    Family History  Problem Relation Age of Onset  . Cervical cancer Mother   . Hypertension Sister   . Diabetes Sister   . Heart disease Neg Hx   . Cancer Neg Hx   . Alcohol abuse Neg Hx   . Early death Neg Hx   . Hearing loss Neg Hx   . Hyperlipidemia Neg Hx   . Kidney disease Neg Hx   .  Stroke Neg Hx     Social History   Social History  . Marital Status: Married    Spouse Name: N/A  . Number of Children: 0  . Years of Education: N/A   Occupational History  . CNA    Social History Main Topics  . Smoking status: Never Smoker   . Smokeless tobacco: Never Used  . Alcohol Use: No  . Drug Use: No  . Sexual Activity: Yes   Other Topics Concern  . Not on file   Social History Narrative    REVIEW OF SYSTEMS: Constitutional:  5 to 8 # weight loss in last 2 months, trying to diet ENT:  No nose bleeds Pulm:  No dyspnea, no cough CV:  No palpitations, no LE edema.  GU:  No hematuria, no frequency GI:  Per HPI Heme:  No issues with excessive bleeding or bruising   Transfusions:  none Neuro:  No headaches, no peripheral tingling or numbness Derm:  No itching, no rash or sores.  Endocrine:  No sweats or chills.  No polyuria or dysuria Immunization:  Not queried.  Travel:  None beyond local counties in last few months.    PHYSICAL EXAM: Vital signs in last 24 hours: Filed Vitals:   12/12/14 0726  BP: 124/58  Pulse: 65  Temp: 98.5 F (36.9 C)  Resp: 16   Wt Readings from Last 3 Encounters:  12/11/14 302 lb 3.3 oz (137.079 kg)  10/15/14 310 lb (140.615 kg)  08/10/14 308 lb (139.708 kg)    General: pleasant, obese, looks well and comfortable. Head:  No swelling or asymmetry  Eyes:  No icterus or pallor Ears:  Not HOH  Nose:  No congestion or discharge Mouth:  Clear , moist, pink oral mucosa.  No dental caries Neck:  No mass, no TMG, no JVD Lungs:  Clear bil.  No labored resps or cough. Heart: RRR.  No mrg.  S1/S2 audible Abdomen:  Obese, tender focally in epigastrium.  No guard or rebound.  Active BS.  soft.   Rectal: deferred   Musc/Skeltl: post surgical appearance to left knee  Extremities:  No CCE  Neurologic:  Oriented x 3.  No limb weakness, no tremor.  Fully aleert and engaged.  No gross neuro deficits Skin:  No jaundice, no telangectasia     Psych:  Pleasant, calm, cooperative.   Intake/Output from previous day:   Intake/Output this shift:    LAB RESULTS:  Recent Labs  12/11/14 0223  WBC 13.2*  HGB 12.4  HCT 39.3  PLT 251   BMET Lab Results  Component Value Date   NA 138 12/11/2014   NA 140 03/14/2014   NA 138 11/07/2013   K 3.5 12/11/2014   K 4.1 03/14/2014   K 3.9 11/07/2013   CL 105 12/11/2014   CL 106 03/14/2014   CL 101 11/07/2013   CO2 24 12/11/2014   CO2 26 03/14/2014   CO2 24 11/07/2013   GLUCOSE 112* 12/11/2014   GLUCOSE 87 03/14/2014   GLUCOSE 104* 11/07/2013   BUN 13 12/11/2014   BUN 13 03/14/2014   BUN 12 11/07/2013   CREATININE 0.73 12/11/2014   CREATININE 0.7 03/14/2014   CREATININE 0.66 11/07/2013   CALCIUM 8.8* 12/11/2014   CALCIUM 9.0 03/14/2014   CALCIUM 9.1 11/07/2013   LFT  Recent Labs  12/11/14 0249  PROT 6.7  ALBUMIN 3.2*  AST 264*  ALT 70*  ALKPHOS 108  BILITOT 0.6  BILIDIR 0.2  IBILI 0.4   PT/INR Lab Results  Component Value Date   INR 1.0 07/06/2007   Hepatitis Panel No results for input(s): HEPBSAG, HCVAB, HEPAIGM, HEPBIGM in the last 72 hours. C-Diff No components found for: CDIFF Lipase     Component Value Date/Time   LIPASE 15* 12/11/2014 0249    Drugs of Abuse  No results found for: LABOPIA, COCAINSCRNUR, LABBENZ, AMPHETMU, THCU, LABBARB   RADIOLOGY STUDIES: Dg Chest 2 View  12/11/2014   CLINICAL DATA:  Acute onset of shortness of breath and epigastric abdominal pain. Nausea. Initial encounter.  EXAM: CHEST  2 VIEW  COMPARISON:  Chest radiograph performed 01/27/2013  FINDINGS: The lungs are well-aerated. Vascular congestion is noted. Mild bibasilar atelectasis is noted. There is no evidence of pleural effusion or pneumothorax.  The heart is borderline normal in size. No acute osseous abnormalities are seen.  IMPRESSION: Vascular congestion noted.  Mild bibasilar atelectasis seen.   Electronically Signed   By: Roanna Raider M.D.   On:  12/11/2014 02:48   Nm Hepatobiliary Including Gb  12/11/2014   CLINICAL DATA:  Epigastric pain and nausea  EXAM: NUCLEAR MEDICINE HEPATOBILIARY IMAGING WITH GALLBLADDER EF  TECHNIQUE: Sequential images of the abdomen were obtained out to 60 minutes following intravenous administration of radiopharmaceutical. After slow intravenous infusion of 2.75 micrograms Cholecystokinin, gallbladder ejection fraction was determined.  RADIOPHARMACEUTICALS:  Five mCi Tc-55m Choletec IV  COMPARISON:  None.  FINDINGS: Gallbladder activity occurs after 60 min. Small bowel activity is faintly visualized after 50 min. There is delayed radiotracer excretion from the liver into the biliary tree. There is no evidence of delayed uptake in the liver from the blood pool. Gallbladder ejection fraction is 57%. There were no symptoms during the CCK infusion. At 45 min, normal ejection fraction is greater than 40%.  IMPRESSION: Gallbladder ejection fraction is within normal limits.  Cystic and common bile ducts are patent.  There is delayed excretion of radiotracer from the liver. An element of hepatic dysfunction is not excluded. Correlate with liver function tests.   Electronically Signed   By: Jolaine Click M.D.   On: 12/11/2014 18:47   US Abdomen Limited  12/11/2014   CLINICAL DATA:  Acute onset of epigastric abdominal pain, radiating to the chest. Initial encounter.  EXAM: US ABDOMEN LIMITED - RIGHT UPPER QUADRANT  COMPARISON:  CT of the abdomen and pelvis performed 11/08/2013, and abdominal ultrasound performed 02/01/2013  FINDINGS: Gallbladder:  Contracted and difficult to fully assess. No pericholecystic fluid seen. No ultrasonographic  Murphy's sign elicited. No definite stones identified.  Common bile duct:  Diameter: 0.6 cm, within normal limits in caliber.  Liver:  No focal lesion identified. Within normal limits in parenchymal echogenicity, though parenchymal echotexture is somewhat coarsened.  IMPRESSION: Gallbladder contracted  and grossly unremarkable. No acute abnormality seen at the right upper quadrant.   Electronically Signed   By: Roanna Raider M.D.   On: 12/11/2014 04:45    ENDOSCOPIC STUDIES: 03/2012, 06/2013  Esophagram.   Small paresophageal diverticulum.    03/2012 gastric emptying study.  Normal.   02/2012 esophageal manometry. Incomplete bolus clearance, increased lower esophageal sphincter pressure, non-specific esophageal motility disorder.   .   08/2011  EGD.  Dr Jarold Motto. For nausea, early satiety.  Negative study  2005 Colonoscopy.  Dr Loreta Ave.  For iron deficiency Negative study.    IMPRESSION:   *  Acute epigastric pain, nausea.  Studies reveal elevated AST/ALT, possible fatty liver on [place] with delayed tracer emptying from liver on HIDA.   The story suggests a passed gallstone though CBD normal and no stones in GB by ultrasound.   Hepatic dysfunction should not be causing acute pain.   *  GERD.  Generally well controlled with daily PPI  *  Morbid obesity  *  OSA.  Not on CPAP    PLAN:     *  MRCP.  Ordered and should be completed sometime this afternoon   Jennye Moccasin  12/12/2014, 8:25 AM Pager: [phone]

## 2014-12-13 ENCOUNTER — Telehealth: Payer: Self-pay

## 2014-12-13 ENCOUNTER — Other Ambulatory Visit: Payer: Self-pay

## 2014-12-13 DIAGNOSIS — R7989 Other specified abnormal findings of blood chemistry: Secondary | ICD-10-CM

## 2014-12-13 DIAGNOSIS — R945 Abnormal results of liver function studies: Secondary | ICD-10-CM

## 2014-12-13 DIAGNOSIS — I10 Essential (primary) hypertension: Secondary | ICD-10-CM | POA: Diagnosis not present

## 2014-12-13 DIAGNOSIS — R1013 Epigastric pain: Secondary | ICD-10-CM

## 2014-12-13 DIAGNOSIS — D72829 Elevated white blood cell count, unspecified: Secondary | ICD-10-CM | POA: Diagnosis not present

## 2014-12-13 LAB — COMPREHENSIVE METABOLIC PANEL
ALBUMIN: 3 g/dL — AB (ref 3.5–5.0)
ALT: 219 U/L — AB (ref 14–54)
AST: 145 U/L — AB (ref 15–41)
Alkaline Phosphatase: 146 U/L — ABNORMAL HIGH (ref 38–126)
Anion gap: 8 (ref 5–15)
CHLORIDE: 101 mmol/L (ref 101–111)
CO2: 28 mmol/L (ref 22–32)
CREATININE: 0.63 mg/dL (ref 0.44–1.00)
Calcium: 8.5 mg/dL — ABNORMAL LOW (ref 8.9–10.3)
GFR calc Af Amer: >60 mL/min (ref >60)
GFR calc non Af Amer: >60 mL/min (ref >60)
GLUCOSE: 83 mg/dL (ref 65–99)
POTASSIUM: 3.5 mmol/L (ref 3.5–5.1)
SODIUM: 137 mmol/L (ref 135–145)
Total Bilirubin: 0.7 mg/dL (ref 0.3–1.2)
Total Protein: 6.4 g/dL — ABNORMAL LOW (ref 6.5–8.1)

## 2014-12-13 LAB — CBC WITH DIFFERENTIAL/PLATELET
BASOS ABS: 0.1 10*3/uL (ref 0.0–0.1)
BASOS PCT: 1 %
EOS ABS: 0.4 10*3/uL (ref 0.0–0.7)
EOS PCT: 5 %
HCT: 36.7 % (ref 36.0–46.0)
Hemoglobin: 11.5 g/dL — ABNORMAL LOW (ref 12.0–15.0)
LYMPHS PCT: 41 %
Lymphs Abs: 3.7 10*3/uL (ref 0.7–4.0)
MCH: 27.1 pg (ref 26.0–34.0)
MCHC: 31.3 g/dL (ref 30.0–36.0)
MCV: 86.4 fL (ref 78.0–100.0)
Monocytes Absolute: 0.8 10*3/uL (ref 0.1–1.0)
Monocytes Relative: 9 %
Neutro Abs: 4 10*3/uL (ref 1.7–7.7)
Neutrophils Relative %: 44 %
PLATELETS: 259 10*3/uL (ref 150–400)
RBC: 4.25 MIL/uL (ref 3.87–5.11)
RDW: 14.5 % (ref 11.5–15.5)
WBC: 8.9 10*3/uL (ref 4.0–10.5)

## 2014-12-13 LAB — HEPATITIS PANEL, ACUTE
HCV Ab: <0.1 s/co ratio (ref 0.0–0.9)
HEP A IGM: NEGATIVE
HEP B C IGM: NEGATIVE
HEP B S AG: NEGATIVE

## 2014-12-13 MED ORDER — METRONIDAZOLE 500 MG PO TABS: 500.0000 mg | ORAL_TABLET | Freq: Three times a day (TID) | ORAL | Status: DC

## 2014-12-13 MED ORDER — ACETAMINOPHEN 500 MG PO TABS: 500.0000 mg | ORAL_TABLET | Freq: Three times a day (TID) | ORAL | Status: DC | PRN

## 2014-12-13 MED ORDER — PROMETHAZINE HCL 12.5 MG PO TABS: 12.5000 mg | ORAL_TABLET | Freq: Four times a day (QID) | ORAL | Status: DC | PRN

## 2014-12-13 MED ORDER — CIPROFLOXACIN HCL 500 MG PO TABS: 500.0000 mg | ORAL_TABLET | Freq: Two times a day (BID) | ORAL | Status: DC

## 2014-12-13 NOTE — Care Management Note (Signed)
Case Management Note  Patient Details  Name: [name] MRN: [id number] Date of Birth: 1965/11/11  Subjective/Objective:                  Date-12-11-14 Tuesday Initial Assessment . Spoke with patient at the bedside.  Introduced self as Sports coach and explained role in discharge planning and how to be reached.  Verified patient lives at [address] [phone].  Verified patient anticipates to go home with Family at time of discharge and will have part-time supervision by family at this time to best of their knowledge.  Patient has no DME. Expressed potential need for no other DME.  Patient denied needing help with their medication.  Patient drivesto MD appointments.  Verified patient has PCP Dr Yetta Barre at [facility].   Plan: CM will continue to follow for discharge planning and [facility] resources.   Lawerance Sabal RN BSN CM [phone]    Action/Plan:  12-13-14 DC to home, independent, no CM needs at this time.   Expected Discharge Date:                  Expected Discharge Plan:  Home/Self Care  In-House Referral:     Discharge planning Services  CM Consult  Post Acute Care Choice:    Choice offered to:     DME Arranged:    DME Agency:     HH Arranged:    HH Agency:     Status of Service:  Completed, signed off  Medicare Important Message Given:    Date Medicare IM Given:    Medicare IM give by:    Date Additional Medicare IM Given:    Additional Medicare Important Message give by:     If discussed at Long Length of Stay Meetings, dates discussed:    Additional Comments:  Lawerance Sabal, RN 12/13/2014, 10:51 AM

## 2014-12-13 NOTE — Discharge Summary (Signed)
Discharge Summary  [name] WUJ:[id number] DOB: 12-09-1965  PCP: Sanda Linger, MD  Admit date: 12/11/2014 Discharge date: 12/13/2014  Time spent: <58mins  Recommendations for Outpatient Follow-up:  1. F/u with PMD within a week for hospital discharge follow up, repeat lft at follow up. 2. F/u with GI and general surgery at scheduled.  Discharge Diagnoses:  Active Hospital Problems   Diagnosis Date Noted  . Epigastric abdominal pain 12/11/2014  . Transaminitis 12/11/2014  . Abdominal pain, epigastric 12/11/2014  . Epigastric pain 12/11/2014  . Morbid obesity with BMI of 50.0-59.9, adult (HCC) 06/28/2013  . GERD (gastroesophageal reflux disease) 06/28/2013  . Leukocytosis 06/28/2013  . Essential hypertension, benign 11/04/2011    Resolved Hospital Problems   Diagnosis Date Noted Date Resolved  No resolved problems to display.    Discharge Condition: stable  Diet recommendation: heart healthy  Filed Weights   12/11/14 0156 12/11/14 0842  Weight: 300 lb (136.079 kg) 302 lb 3.3 oz (137.079 kg)    History of present illness:  [name] is a 49 y.o. female who developed acute epigastric pain and nausea while at work around 11:00 last night. The epigastric pain radiated upward into her chest and then began radiating around both sides of her upper abdomen. Patient has never had this type of pain before. It came on suddenly, severe at onset and constant in nature. The pain eased off when given pain medications by EMS. Pain later recurred requiring more medication. Currently epigastric pain has subsided the patient did have one episode of nonbloody emesis a few minutes ago. No NSAID use at home. No history peptic ulcer disease. Patient is on a daily PPI. She does report a fever of 99.6 last night, no chills  Hospital Course:  Principal Problem:   Epigastric abdominal pain Active Problems:   Essential hypertension, benign   Morbid obesity with BMI of 50.0-59.9,  adult (HCC)   Leukocytosis   GERD (gastroesophageal reflux disease)   Transaminitis   Abdominal pain, epigastric   Epigastric pain  Epigastric abdominal pain.  Acute onset epigastric pain initially radiating upward chest then around both sides of upper abdomen. Transaminases elevated, no suggestion of acute cholecystitis on ultrasound. No cholelithiasis.   Given low-grade temperature, leukocytosis and upper abdominal pain, she was started on abx empirically, discharged with oral cipro/flagyl for 4 days to finished total 7day abx.  negtaive HIDA scan. surgical signed off, Gi consulted, negative MRCP, lft trending down, pain resolved and tolerating diet, she is cleared to be discharged by GI and outpatient GI/surgery follow up.   Leukocytosis.  Low-grade temp at home, -afebrile, leukocytosis resolved at discharge.  Transaminitis.  Etiology? No new home meds. No significant ETOH use. No cholelithiasis or biliary duct dilation on u/s. Rule out gallbladder disease. Negative HIDA. Hepatitis panel negative. lft trending down at discharge, pain has resolved, possible has passed gallstone? Outpatient followup with GI and general surgery.   Essential hypertension, benign. BP stable. Continue home Metoprolol  Morbid obesity with BMI of 50.0-59.9. She is trying to lose weight.   GERD. Stable. Reflux esophagitis on EGD July 2013. Continue home PPI.   Code Status: full  Family Communication: patient   Disposition Plan: home    Consultants:  GI  General surgery  Procedures:  mrcp  Antibiotics:  Zosyn from admission  Discharged on oral cipro and flagyl for days to finish total of 7day abx.  Discharge Exam: BP 128/68 mmHg  Pulse 65  Temp(Src) 98.3 F (36.8 C) (Oral)  Resp 16  Ht  (1.651 m)  Wt 302 lb 3.3 oz (137.079 kg)  BMI 50.29 kg/m2  SpO2 100%   General: NAD  Cardiovascular: RRR  Respiratory: CTABL  Abdomen: Soft/ND/NT, positive  BS  Musculoskeletal: No Edema  Neuro: aaox3   Discharge Instructions You were cared for by a hospitalist during your hospital stay. If you have any questions about your discharge medications or the care you received while you were in the hospital after you are discharged, you can call the unit and asked to speak with the hospitalist on call if the hospitalist that took care of you is not available. Once you are discharged, your primary care physician will handle any further medical issues. Please note that NO REFILLS for any discharge medications will be authorized once you are discharged, as it is imperative that you return to your primary care physician (or establish a relationship with a primary care physician if you do not have one) for your aftercare needs so that they can reassess your need for medications and monitor your lab values.  Discharge Instructions    Diet - low sodium heart healthy    Complete by:  As directed      Increase activity slowly    Complete by:  As directed             Medication List    TAKE these medications        acetaminophen 500 MG tablet  Commonly known as:  TYLENOL  Take 1 tablet (500 mg total) by mouth every 8 (eight) hours as needed. For pain     ciprofloxacin 500 MG tablet  Commonly known as:  CIPRO  Take 1 tablet (500 mg total) by mouth 2 (two) times daily.     metoprolol succinate 50 MG 24 hr tablet  Commonly known as:  TOPROL-XL  Take 1 tablet (50 mg total) by mouth daily.     metroNIDAZOLE 500 MG tablet  Commonly known as:  FLAGYL  Take 1 tablet (500 mg total) by mouth 3 (three) times daily.     promethazine 12.5 MG tablet  Commonly known as:  PHENERGAN  Take 1 tablet (12.5 mg total) by mouth every 6 (six) hours as needed for nausea or vomiting.       Allergies  Allergen Reactions  . Oxycodone Shortness Of Breath       Follow-up Information    Follow up with CORNETT,THOMAS A., MD. Schedule an appointment as soon as  possible for a visit in 3 weeks.   Specialty:  General Surgery   Why:  For post-hospital follow up with Dr. Luisa Hart if desired to discuss whether elective gallbladder removal is necessary once all GI workup is complete.   Contact information:   [address] [place] [address] [phone]       Follow up with Sanda Linger, MD In 1 week.   Specialty:  Internal Medicine   Why:  hospital discharge follow up, repeat cmp at follow up   Contact information:   [address] [place] [address] [phone]       Follow up with Stan Head, MD On 02/11/2015.   Specialty:  Gastroenterology   Why:  11 AM for liver follow up    Contact information:   [address] [place] [address] [phone]       Follow up with [facility] On 02/04/2015.   Why:  go to lab  in basement of [facility] for blood work any time that week before your appt.  lab hours ~ 8 to 5.    Contact information:   [address] [place] [address]        The results of significant diagnostics from this hospitalization (including imaging, microbiology, ancillary and laboratory) are listed below for reference.    Significant Diagnostic Studies: Dg Chest 2 View  12/11/2014   CLINICAL DATA:  Acute onset of shortness of breath and epigastric abdominal pain. Nausea. Initial encounter.  EXAM: CHEST  2 VIEW  COMPARISON:  Chest radiograph performed 01/27/2013  FINDINGS: The lungs are well-aerated. Vascular congestion is noted. Mild bibasilar atelectasis is noted. There is no evidence of pleural effusion or pneumothorax.  The heart is borderline normal in size. No acute osseous abnormalities are seen.  IMPRESSION: Vascular congestion noted.  Mild bibasilar atelectasis seen.   Electronically Signed   By: Roanna Raider M.D.   On: 12/11/2014 02:48   Nm Hepatobiliary Including Gb  12/11/2014   CLINICAL DATA:  Epigastric pain and nausea  EXAM: NUCLEAR  MEDICINE HEPATOBILIARY IMAGING WITH GALLBLADDER EF  TECHNIQUE: Sequential images of the abdomen were obtained out to 60 minutes following intravenous administration of radiopharmaceutical. After slow intravenous infusion of 2.75 micrograms Cholecystokinin, gallbladder ejection fraction was determined.  RADIOPHARMACEUTICALS:  Five mCi Tc-74m Choletec IV  COMPARISON:  None.  FINDINGS: Gallbladder activity occurs after 60 min. Small bowel activity is faintly visualized after 50 min. There is delayed radiotracer excretion from the liver into the biliary tree. There is no evidence of delayed uptake in the liver from the blood pool. Gallbladder ejection fraction is 57%. There were no symptoms during the CCK infusion. At 45 min, normal ejection fraction is greater than 40%.  IMPRESSION: Gallbladder ejection fraction is within normal limits.  Cystic and common bile ducts are patent.  There is delayed excretion of radiotracer from the liver. An element of hepatic dysfunction is not excluded. Correlate with liver function tests.   Electronically Signed   By: Jolaine Click M.D.   On: 12/11/2014 18:47   Mr 3d Recon At Scanner  12/13/2014   CLINICAL DATA:  49 year old female inpatient with acute epigastric abdominal pain and nausea.  EXAM: MRI ABDOMEN WITHOUT AND WITH CONTRAST (INCLUDING MRCP)  TECHNIQUE: Multiplanar multisequence MR imaging of the abdomen was performed both before and after the administration of intravenous contrast. Heavily T2-weighted images of the biliary and pancreatic ducts were obtained, and three-dimensional MRCP images were rendered by post processing.  CONTRAST:  20mL MULTIHANCE GADOBENATE DIMEGLUMINE 529 MG/ML IV SOLN  COMPARISON:  CT abdomen/pelvis from 11/08/2013. Abdominal sonogram from 12/11/2014.  FINDINGS: Lower chest: Clear lung bases.  Top-normal heart size.  Hepatobiliary: Normal liver size and configuration. There is mild diffuse hepatic steatosis. No liver mass. Normal gallbladder with no  cholelithiasis. No biliary ductal dilatation. No choledocholithiasis. Common bile duct diameter 5 mm.  Pancreas: No pancreatic mass or duct dilation. No evidence of pancreas divisum.  Spleen: Normal size. No mass.  Adrenals/Urinary Tract: Normal adrenals. No hydronephrosis. Normal kidneys with no renal mass.  Stomach/Bowel: Grossly normal stomach. Visualized small and large bowel is normal caliber, with no bowel wall thickening.  Vascular/Lymphatic: Normal caliber abdominal aorta. Patent portal, splenic, hepatic and renal veins. No pathologically enlarged lymph nodes in the abdomen.  Other: No abdominal ascites or focal fluid collection.  Musculoskeletal: No aggressive appearing focal osseous lesions.  IMPRESSION: 1. Mild diffuse hepatic steatosis. 2.  Otherwise normal MRI of the abdomen. Normal gallbladder with no cholelithiasis. No biliary ductal dilatation.   Electronically Signed   By: Delbert Phenix M.D.   On: 12/13/2014 08:28   US Abdomen Limited  12/11/2014   CLINICAL DATA:  Acute onset of epigastric abdominal pain, radiating to the chest. Initial encounter.  EXAM: US ABDOMEN LIMITED - RIGHT UPPER QUADRANT  COMPARISON:  CT of the abdomen and pelvis performed 11/08/2013, and abdominal ultrasound performed 02/01/2013  FINDINGS: Gallbladder:  Contracted and difficult to fully assess. No pericholecystic fluid seen. No ultrasonographic Murphy's sign elicited. No definite stones identified.  Common bile duct:  Diameter: 0.6 cm, within normal limits in caliber.  Liver:  No focal lesion identified. Within normal limits in parenchymal echogenicity, though parenchymal echotexture is somewhat coarsened.  IMPRESSION: Gallbladder contracted and grossly unremarkable. No acute abnormality seen at the right upper quadrant.   Electronically Signed   By: Roanna Raider M.D.   On: 12/11/2014 04:45   Mr Roe Coombs W/wo Cm/mrcp  12/13/2014   CLINICAL DATA:  49 year old female inpatient with acute epigastric abdominal pain and nausea.   EXAM: MRI ABDOMEN WITHOUT AND WITH CONTRAST (INCLUDING MRCP)  TECHNIQUE: Multiplanar multisequence MR imaging of the abdomen was performed both before and after the administration of intravenous contrast. Heavily T2-weighted images of the biliary and pancreatic ducts were obtained, and three-dimensional MRCP images were rendered by post processing.  CONTRAST:  20mL MULTIHANCE GADOBENATE DIMEGLUMINE 529 MG/ML IV SOLN  COMPARISON:  CT abdomen/pelvis from 11/08/2013. Abdominal sonogram from 12/11/2014.  FINDINGS: Lower chest: Clear lung bases.  Top-normal heart size.  Hepatobiliary: Normal liver size and configuration. There is mild diffuse hepatic steatosis. No liver mass. Normal gallbladder with no cholelithiasis. No biliary ductal dilatation. No choledocholithiasis. Common bile duct diameter 5 mm.  Pancreas: No pancreatic mass or duct dilation. No evidence of pancreas divisum.  Spleen: Normal size. No mass.  Adrenals/Urinary Tract: Normal adrenals. No hydronephrosis. Normal kidneys with no renal mass.  Stomach/Bowel: Grossly normal stomach. Visualized small and large bowel is normal caliber, with no bowel wall thickening.  Vascular/Lymphatic: Normal caliber abdominal aorta. Patent portal, splenic, hepatic and renal veins. No pathologically enlarged lymph nodes in the abdomen.  Other: No abdominal ascites or focal fluid collection.  Musculoskeletal: No aggressive appearing focal osseous lesions.  IMPRESSION: 1. Mild diffuse hepatic steatosis. 2. Otherwise normal MRI of the abdomen. Normal gallbladder with no cholelithiasis. No biliary ductal dilatation.   Electronically Signed   By: Delbert Phenix M.D.   On: 12/13/2014 08:28    Microbiology: No results found for this or any previous visit (from the past 240 hour(s)).   Labs: Basic Metabolic Panel:  Recent Labs Lab 12/11/14 0223 12/12/14 1100 12/13/14 0639  NA 138 139 137  K 3.5 3.6 3.5  CL 105 103 101  CO2 GLUCOSE 112* 82 83  BUN 13 6 <5*    CREATININE 0.73 0.66 0.63  CALCIUM 8.8* 8.8* 8.5*   Liver Function Tests:  Recent Labs Lab 12/11/14 0249 12/12/14 1100 12/13/14 0639  AST 264* 405* 145*  ALT 70* 347* 219*  ALKPHOS 108 186* 146*  BILITOT 0.6 1.0 0.7  PROT 6.7 7.4 6.4*  ALBUMIN 3.2* 3.4* 3.0*    Recent Labs Lab 12/11/14 0249  LIPASE 15*   No results for input(s): AMMONIA in the last 168 hours. CBC:  Recent Labs Lab 12/11/14 0223 12/13/14 0639  WBC 13.2* 8.9  NEUTROABS  --  4.0  HGB 12.4  11.5*  HCT 39.3 36.7  MCV 86.4 86.4  PLT 251 259   Cardiac Enzymes:  Recent Labs Lab 12/11/14 1107 12/11/14 1834  TROPONINI <0.03 <0.03   BNP: BNP (last 3 results) No results for input(s): BNP in the last 8760 hours.  ProBNP (last 3 results) No results for input(s): PROBNP in the last 8760 hours.  CBG: No results for input(s): GLUCAP in the last 168 hours.     SignedAlbertine Grates MD, PhD  [facility] 12/13/2014, 10:38 AM

## 2014-12-13 NOTE — Telephone Encounter (Signed)
Orders entered

## 2014-12-13 NOTE — Progress Notes (Signed)
HIDA NEG, GI following, ?EGD, MRCP results pending.  No surgical plans this admission.  She can follow up with Dr. Luisa Hart in the office to discuss elective lap chole if desired in the future if GI workup remains negative.  Discharge from our standpoint once medically stable.    Jorje Guild, PA-C General Surgery [facility] [phone]

## 2014-12-13 NOTE — Telephone Encounter (Signed)
-----   Message from Dianah Field, PA-C sent at 12/13/2014  9:42 AM EDT ----- Regarding: LFTs in the week before visit with Dr Leone Payor.  Hi Sheri.  Pt has 12/5 follow up with Leone Payor fo abnormal LFTs, acute unexplained epigastric pain.  Can you place orders for hepatic function profile to be obtained during the week prior to the appt?  Thanks. S.

## 2014-12-13 NOTE — Progress Notes (Signed)
        Daily Rounding Note  12/13/2014, 8:47 AM    SUBJECTIVE:       Last use of pain meds was Tramadol at 0520 yesterday AM.   Pain at epigastrium is all but resolved.  Tolerating clears. No nausea.  Feels ready to discharge home and eager to leave hospital  OBJECTIVE:         Vital signs in last 24 hours:    Temp:  [97.8 F (36.6 C)-99.4 F (37.4 C)] 98.3 F (36.8 C) (10/06 0547) Pulse Rate:  [63-65] 65 (10/06 0547) Resp:  [16] 16 (10/06 0547) BP: (123-131)/(56-68) 128/68 mmHg (10/06 0547) SpO2:  [96 %-100 %] 100 % (10/06 0547) Last BM Date: 12/10/14 Filed Weights   12/11/14 0156 12/11/14 0842  Weight: 300 lb (136.079 kg) 302 lb 3.3 oz (137.079 kg)   General: pleasant, looks well.  comfortable   Heart: RRR Chest: clear bil.   Abdomen: soft, NT, ND, obese.  Active BS  Extremities: no CCE Neuro/Psych:  Alert, oriented x 3.  No neuro deficits.   Intake/Output from previous day: 10/05 0701 - 10/06 0700 In: 3518.8 [I.V.:3318.8; IV Piggyback:200] Out: -   Intake/Output this shift:    Lab Results:  Recent Labs  12/11/14 0223 12/13/14 0639  WBC 13.2* 8.9  HGB 12.4 11.5*  HCT 39.3 36.7  PLT 251 259   BMET  Recent Labs  12/11/14 0223 12/12/14 1100 12/13/14 0639  NA 138 139 137  K 3.5 3.6 3.5  CL 105 103 101  CO2 24 26 28  GLUCOSE 112* 82 83  BUN 13 6 <5*  CREATININE 0.73 0.66 0.63  CALCIUM 8.8* 8.8* 8.5*   LFT  Recent Labs  12/11/14 0249 12/12/14 1100 12/13/14 0639  PROT 6.7 7.4 6.4*  ALBUMIN 3.2* 3.4* 3.0*  AST 264* 405* 145*  ALT 70* 347* 219*  ALKPHOS 108 186* 146*  BILITOT 0.6 1.0 0.7  BILIDIR 0.2  --   --   IBILI 0.4  --   --    PT/INR No results for input(s): LABPROT, INR in the last 72 hours. Hepatitis Panel  Recent Labs  12/12/14 0956  HEPBSAG Negative  HCVAB <0.1  HEPAIGM Negative  HEPBIGM Negative    Studies/Results: Nm Hepatobiliary Including Gb  12/11/2014    CLINICAL DATA:  Epigastric pain and nausea  EXAM: NUCLEAR MEDICINE HEPATOBILIARY IMAGING WITH GALLBLADDER EF  TECHNIQUE: Sequential images of the abdomen were obtained out to 60 minutes following intravenous administration of radiopharmaceutical. After slow intravenous infusion of 2.75 micrograms Cholecystokinin, gallbladder ejection fraction was determined.  RADIOPHARMACEUTICALS:  Five mCi Tc-99m Choletec IV  COMPARISON:  None.  FINDINGS: Gallbladder activity occurs after 60 min. Small bowel activity is faintly visualized after 50 min. There is delayed radiotracer excretion from the liver into the biliary tree. There is no evidence of delayed uptake in the liver from the blood pool. Gallbladder ejection fraction is 57%. There were no symptoms during the CCK infusion. At 45 min, normal ejection fraction is greater than 40%.  IMPRESSION: Gallbladder ejection fraction is within normal limits.  Cystic and common bile ducts are patent.  There is delayed excretion of radiotracer from the liver. An element of hepatic dysfunction is not excluded. Correlate with liver function tests.   Electronically Signed   By: Arthur  Hoss M.D.   On: 12/11/2014 18:47   Mr 3d Recon At Scanner Mr Abd W/wo Cm/mrcp 12/13/2014   CLINICAL DATA:  49-year-old female inpatient   with acute epigastric abdominal pain and nausea.  EXAM: MRI ABDOMEN WITHOUT AND WITH CONTRAST (INCLUDING MRCP)  TECHNIQUE: Multiplanar multisequence MR imaging of the abdomen was performed both before and after the administration of intravenous contrast. Heavily T2-weighted images of the biliary and pancreatic ducts were obtained, and three-dimensional MRCP images were rendered by post processing.  CONTRAST:  33m MULTIHANCE GADOBENATE DIMEGLUMINE 529 MG/ML IV SOLN  COMPARISON:  CT abdomen/pelvis from 11/08/2013. Abdominal sonogram from 12/11/2014.  FINDINGS: Lower chest: Clear lung bases.  Top-normal heart size.  Hepatobiliary: Normal liver size and configuration. There  is mild diffuse hepatic steatosis. No liver mass. Normal gallbladder with no cholelithiasis. No biliary ductal dilatation. No choledocholithiasis. Common bile duct diameter 5 mm.  Pancreas: No pancreatic mass or duct dilation. No evidence of pancreas divisum.  Spleen: Normal size. No mass.  Adrenals/Urinary Tract: Normal adrenals. No hydronephrosis. Normal kidneys with no renal mass.  Stomach/Bowel: Grossly normal stomach. Visualized small and large bowel is normal caliber, with no bowel wall thickening.  Vascular/Lymphatic: Normal caliber abdominal aorta. Patent portal, splenic, hepatic and renal veins. No pathologically enlarged lymph nodes in the abdomen.  Other: No abdominal ascites or focal fluid collection.  Musculoskeletal: No aggressive appearing focal osseous lesions.  IMPRESSION: 1. Mild diffuse hepatic steatosis. 2. Otherwise normal MRI of the abdomen. Normal gallbladder with no cholelithiasis. No biliary ductal dilatation.   Electronically Signed   By: JIlona SorrelM.D.   On: 12/13/2014 08:28    ASSESMENT:   * Acute epigastric pain, nausea.Elevated AST/ALT/alk phos (improved today), possible fatty liver on [place] delayed tracer emptying from liver on HIDA. Fatty liver on MRCP. Hepatitis serologies negative.  The story/labs suggests a passed gallstone/acute cholcystitis though no imaging supports this.Hepatic dysfunction does not cause acute pain.   * GERD. Generally well controlled with daily PPI    PLAN   *  Discharge home today.  GI follow up 12/5 with Dr GCarlean Purl.   [place]. Can obtain LFT recheck there as well but will arrange for labs the week [facility]  Re fatty liver pt was advised to double her efforts at weight loss, exercise.     SAzucena Freed 12/13/2014, 8:47 AM Pager: [phone]

## 2014-12-18 ENCOUNTER — Other Ambulatory Visit: Payer: Self-pay

## 2014-12-18 DIAGNOSIS — Z1231 Encounter for screening mammogram for malignant neoplasm of breast: Secondary | ICD-10-CM

## 2014-12-20 ENCOUNTER — Ambulatory Visit (INDEPENDENT_AMBULATORY_CARE_PROVIDER_SITE_OTHER): Payer: BLUE CROSS/BLUE SHIELD | Admitting: Internal Medicine

## 2014-12-20 ENCOUNTER — Encounter: Payer: Self-pay | Admitting: Internal Medicine

## 2014-12-20 ENCOUNTER — Other Ambulatory Visit (INDEPENDENT_AMBULATORY_CARE_PROVIDER_SITE_OTHER): Payer: BLUE CROSS/BLUE SHIELD

## 2014-12-20 VITALS — BP 118/80 | HR 78 | Temp 98.1°F | Resp 16 | Ht 65.0 in | Wt 294.0 lb

## 2014-12-20 DIAGNOSIS — I10 Essential (primary) hypertension: Secondary | ICD-10-CM

## 2014-12-20 DIAGNOSIS — K760 Fatty (change of) liver, not elsewhere classified: Secondary | ICD-10-CM

## 2014-12-20 DIAGNOSIS — K2100 Gastro-esophageal reflux disease with esophagitis, without bleeding: Secondary | ICD-10-CM

## 2014-12-20 LAB — CBC WITH DIFFERENTIAL/PLATELET
BASOS ABS: 0.1 10*3/uL (ref 0.0–0.1)
Basophils Relative: 0.9 % (ref 0.0–3.0)
EOS ABS: 0.6 10*3/uL (ref 0.0–0.7)
Eosinophils Relative: 4.3 % (ref 0.0–5.0)
HCT: 40.8 % (ref 36.0–46.0)
HEMOGLOBIN: 13.3 g/dL (ref 12.0–15.0)
Lymphocytes Relative: 32.4 % (ref 12.0–46.0)
Lymphs Abs: 4.4 10*3/uL — ABNORMAL HIGH (ref 0.7–4.0)
MCHC: 32.5 g/dL (ref 30.0–36.0)
MCV: 84.1 fl (ref 78.0–100.0)
MONO ABS: 1.3 10*3/uL — AB (ref 0.1–1.0)
Monocytes Relative: 9.7 % (ref 3.0–12.0)
NEUTROS PCT: 52.7 % (ref 43.0–77.0)
Neutro Abs: 7.1 10*3/uL (ref 1.4–7.7)
Platelets: 326 10*3/uL (ref 150.0–400.0)
RBC: 4.86 Mil/uL (ref 3.87–5.11)
RDW: 14.4 % (ref 11.5–15.5)
WBC: 13.5 10*3/uL — AB (ref 4.0–10.5)

## 2014-12-20 LAB — BASIC METABOLIC PANEL
BUN: 11 mg/dL (ref 6–23)
CALCIUM: 9.3 mg/dL (ref 8.4–10.5)
CO2: 28 mEq/L (ref 19–32)
CREATININE: 0.65 mg/dL (ref 0.40–1.20)
Chloride: 105 mEq/L (ref 96–112)
GFR: 123.86 mL/min (ref >60.00)
Glucose, Bld: 95 mg/dL (ref 70–99)
POTASSIUM: 3.8 meq/L (ref 3.5–5.1)
Sodium: 140 mEq/L (ref 135–145)

## 2014-12-20 LAB — HEPATIC FUNCTION PANEL
ALT: 42 U/L — AB (ref 0–35)
AST: 21 U/L (ref 0–37)
Albumin: 3.8 g/dL (ref 3.5–5.2)
Alkaline Phosphatase: 121 U/L — ABNORMAL HIGH (ref 39–117)
BILIRUBIN DIRECT: 0.1 mg/dL (ref 0.0–0.3)
BILIRUBIN TOTAL: 0.4 mg/dL (ref 0.2–1.2)
Total Protein: 7.5 g/dL (ref 6.0–8.3)

## 2014-12-20 MED ORDER — METOPROLOL SUCCINATE ER 50 MG PO TB24: 50.0000 mg | ORAL_TABLET | Freq: Every day | ORAL | Status: DC

## 2014-12-20 MED ORDER — PANTOPRAZOLE SODIUM 40 MG PO TBEC: 40.0000 mg | DELAYED_RELEASE_TABLET | Freq: Every day | ORAL | Status: DC

## 2014-12-20 NOTE — Progress Notes (Signed)
Pre visit review using our clinic review tool, if applicable. No additional management support is needed unless otherwise documented below in the visit note. 

## 2014-12-20 NOTE — Progress Notes (Signed)
Subjective:  Patient ID: Gina HurtSamantha [name], female    DOB: 10/16/1965  Age: 49 y.o. MRN: [id number]  CC: Gastroesophageal Reflux   HPI [name] presents for f/up after a recent admission for acute onset epigastric abd pain that has resolved, her U/S showed fatty liver but no GB stones. She has a f/up appt soon with general surgery. She has started taking a PPI and feels much better.  Outpatient Prescriptions Prior to Visit  Medication Sig Dispense Refill  . acetaminophen (TYLENOL) 500 MG tablet Take 1 tablet (500 mg total) by mouth every 8 (eight) hours as needed. For pain 30 tablet 0  . ciprofloxacin (CIPRO) 500 MG tablet Take 1 tablet (500 mg total) by mouth 2 (two) times daily. 8 tablet 0  . metoprolol succinate (TOPROL-XL) 50 MG 24 hr tablet Take 1 tablet (50 mg total) by mouth daily. 90 tablet 2  . metroNIDAZOLE (FLAGYL) 500 MG tablet Take 1 tablet (500 mg total) by mouth 3 (three) times daily. 12 tablet 0  . promethazine (PHENERGAN) 12.5 MG tablet Take 1 tablet (12.5 mg total) by mouth every 6 (six) hours as needed for nausea or vomiting. 10 tablet 0   No facility-administered medications prior to visit.    ROS Review of Systems  Constitutional: Positive for appetite change and unexpected weight change. Negative for chills, diaphoresis and activity change.  HENT: Negative.  Negative for sore throat, trouble swallowing and voice change.   Eyes: Negative.   Respiratory: Negative.  Negative for cough, choking, chest tightness, shortness of breath, wheezing and stridor.   Cardiovascular: Negative.  Negative for chest pain, palpitations and leg swelling.  Gastrointestinal: Negative.  Negative for nausea, vomiting, abdominal pain, diarrhea, constipation and blood in stool.  Endocrine: Negative.   Genitourinary: Negative.   Musculoskeletal: Negative.   Skin: Negative.  Negative for color change and rash.  Allergic/Immunologic: Negative.   Neurological: Negative.     Hematological: Negative.  Negative for adenopathy. Does not bruise/bleed easily.  Psychiatric/Behavioral: Negative.     Objective:  BP 118/80 mmHg  Pulse 78  Temp(Src) 98.1 F (36.7 C) (Oral)  Resp 16  Ht 5\' 5"  (1.651 m)  Wt 294 lb (133.358 kg)  BMI 48.92 kg/m2  SpO2 97%  BP Readings from Last 3 Encounters:  12/20/14 118/80  12/13/14 128/68  10/15/14 152/85    Wt Readings from Last 3 Encounters:  12/20/14 294 lb (133.358 kg)  12/11/14 302 lb 3.3 oz (137.079 kg)  10/15/14 310 lb (140.615 kg)    Physical Exam  Constitutional: She is oriented to person, place, and time.  Non-toxic appearance. She does not have a sickly appearance. She does not appear ill. No distress.  HENT:  Head: Normocephalic and atraumatic.  Mouth/Throat: Oropharynx is clear and moist. No oropharyngeal exudate.  Eyes: Conjunctivae are normal. Right eye exhibits no discharge. Left eye exhibits no discharge. No scleral icterus.  Neck: Normal range of motion. No JVD present. No tracheal deviation present. No thyromegaly present.  Cardiovascular: Normal rate, regular rhythm, normal heart sounds and intact distal pulses.  Exam reveals no gallop and no friction rub.   No murmur heard. Pulmonary/Chest: Effort normal and breath sounds normal. No stridor. No respiratory distress. She has no wheezes. She has no rales. She exhibits no tenderness.  Abdominal: Soft. Bowel sounds are normal. She exhibits no distension and no mass. There is no tenderness. There is no rebound and no guarding.  Musculoskeletal: Normal range of motion. She exhibits no edema or  tenderness.  Lymphadenopathy:    She has no cervical adenopathy.  Neurological: She is oriented to person, place, and time.  Skin: Skin is warm and dry. No rash noted. She is not diaphoretic. No erythema. No pallor.  Vitals reviewed.   Lab Results  Component Value Date   WBC 13.5* 12/20/2014   HGB 13.3 12/20/2014   HCT 40.8 12/20/2014   PLT 326.0 12/20/2014    GLUCOSE 95 12/20/2014   CHOL 205* 03/14/2014   TRIG 108.0 03/14/2014   HDL 47.30 03/14/2014   LDLCALC 136* 03/14/2014   ALT 42* 12/20/2014   AST 21 12/20/2014   NA 140 12/20/2014   K 3.8 12/20/2014   CL 105 12/20/2014   CREATININE 0.65 12/20/2014   BUN 11 12/20/2014   CO2 28 12/20/2014   TSH 1.71 03/14/2014   INR 1.0 07/06/2007   HGBA1C 5.9 06/28/2013    Dg Chest 2 View  12/11/2014  CLINICAL DATA:  Acute onset of shortness of breath and epigastric abdominal pain. Nausea. Initial encounter. EXAM: CHEST  2 VIEW COMPARISON:  Chest radiograph performed 01/27/2013 FINDINGS: The lungs are well-aerated. Vascular congestion is noted. Mild bibasilar atelectasis is noted. There is no evidence of pleural effusion or pneumothorax. The heart is borderline normal in size. No acute osseous abnormalities are seen. IMPRESSION: Vascular congestion noted.  Mild bibasilar atelectasis seen. Electronically Signed   By: Roanna Raider M.D.   On: 12/11/2014 02:48   Nm Hepatobiliary Including Gb  12/11/2014  CLINICAL DATA:  Epigastric pain and nausea EXAM: NUCLEAR MEDICINE HEPATOBILIARY IMAGING WITH GALLBLADDER EF TECHNIQUE: Sequential images of the abdomen were obtained out to 60 minutes following intravenous administration of radiopharmaceutical. After slow intravenous infusion of 2.75 micrograms Cholecystokinin, gallbladder ejection fraction was determined. RADIOPHARMACEUTICALS:  Five mCi Tc-66m Choletec IV COMPARISON:  None. FINDINGS: Gallbladder activity occurs after 60 min. Small bowel activity is faintly visualized after 50 min. There is delayed radiotracer excretion from the liver into the biliary tree. There is no evidence of delayed uptake in the liver from the blood pool. Gallbladder ejection fraction is 57%. There were no symptoms during the CCK infusion. At 45 min, normal ejection fraction is greater than 40%. IMPRESSION: Gallbladder ejection fraction is within normal limits. Cystic and common bile  ducts are patent. There is delayed excretion of radiotracer from the liver. An element of hepatic dysfunction is not excluded. Correlate with liver function tests. Electronically Signed   By: Jolaine Click M.D.   On: 12/11/2014 18:47   Mr 3d Recon At Scanner  12/13/2014  CLINICAL DATA:  49 year old female inpatient with acute epigastric abdominal pain and nausea. EXAM: MRI ABDOMEN WITHOUT AND WITH CONTRAST (INCLUDING MRCP) TECHNIQUE: Multiplanar multisequence MR imaging of the abdomen was performed both before and after the administration of intravenous contrast. Heavily T2-weighted images of the biliary and pancreatic ducts were obtained, and three-dimensional MRCP images were rendered by post processing. CONTRAST:  20mL MULTIHANCE GADOBENATE DIMEGLUMINE 529 MG/ML IV SOLN COMPARISON:  CT abdomen/pelvis from 11/08/2013. Abdominal sonogram from 12/11/2014. FINDINGS: Lower chest: Clear lung bases.  Top-normal heart size. Hepatobiliary: Normal liver size and configuration. There is mild diffuse hepatic steatosis. No liver mass. Normal gallbladder with no cholelithiasis. No biliary ductal dilatation. No choledocholithiasis. Common bile duct diameter 5 mm. Pancreas: No pancreatic mass or duct dilation. No evidence of pancreas divisum. Spleen: Normal size. No mass. Adrenals/Urinary Tract: Normal adrenals. No hydronephrosis. Normal kidneys with no renal mass. Stomach/Bowel: Grossly normal stomach. Visualized small and large bowel is  normal caliber, with no bowel wall thickening. Vascular/Lymphatic: Normal caliber abdominal aorta. Patent portal, splenic, hepatic and renal veins. No pathologically enlarged lymph nodes in the abdomen. Other: No abdominal ascites or focal fluid collection. Musculoskeletal: No aggressive appearing focal osseous lesions. IMPRESSION: 1. Mild diffuse hepatic steatosis. 2. Otherwise normal MRI of the abdomen. Normal gallbladder with no cholelithiasis. No biliary ductal dilatation. Electronically  Signed   By: Delbert Phenix M.D.   On: 12/13/2014 08:28   US Abdomen Limited  12/11/2014  CLINICAL DATA:  Acute onset of epigastric abdominal pain, radiating to the chest. Initial encounter. EXAM: US ABDOMEN LIMITED - RIGHT UPPER QUADRANT COMPARISON:  CT of the abdomen and pelvis performed 11/08/2013, and abdominal ultrasound performed 02/01/2013 FINDINGS: Gallbladder: Contracted and difficult to fully assess. No pericholecystic fluid seen. No ultrasonographic Murphy's sign elicited. No definite stones identified. Common bile duct: Diameter: 0.6 cm, within normal limits in caliber. Liver: No focal lesion identified. Within normal limits in parenchymal echogenicity, though parenchymal echotexture is somewhat coarsened. IMPRESSION: Gallbladder contracted and grossly unremarkable. No acute abnormality seen at the right upper quadrant. Electronically Signed   By: Roanna Raider M.D.   On: 12/11/2014 04:45   Mr Roe Coombs W/wo Cm/mrcp  12/13/2014  CLINICAL DATA:  50 year old female inpatient with acute epigastric abdominal pain and nausea. EXAM: MRI ABDOMEN WITHOUT AND WITH CONTRAST (INCLUDING MRCP) TECHNIQUE: Multiplanar multisequence MR imaging of the abdomen was performed both before and after the administration of intravenous contrast. Heavily T2-weighted images of the biliary and pancreatic ducts were obtained, and three-dimensional MRCP images were rendered by post processing. CONTRAST:  20mL MULTIHANCE GADOBENATE DIMEGLUMINE 529 MG/ML IV SOLN COMPARISON:  CT abdomen/pelvis from 11/08/2013. Abdominal sonogram from 12/11/2014. FINDINGS: Lower chest: Clear lung bases.  Top-normal heart size. Hepatobiliary: Normal liver size and configuration. There is mild diffuse hepatic steatosis. No liver mass. Normal gallbladder with no cholelithiasis. No biliary ductal dilatation. No choledocholithiasis. Common bile duct diameter 5 mm. Pancreas: No pancreatic mass or duct dilation. No evidence of pancreas divisum. Spleen: Normal  size. No mass. Adrenals/Urinary Tract: Normal adrenals. No hydronephrosis. Normal kidneys with no renal mass. Stomach/Bowel: Grossly normal stomach. Visualized small and large bowel is normal caliber, with no bowel wall thickening. Vascular/Lymphatic: Normal caliber abdominal aorta. Patent portal, splenic, hepatic and renal veins. No pathologically enlarged lymph nodes in the abdomen. Other: No abdominal ascites or focal fluid collection. Musculoskeletal: No aggressive appearing focal osseous lesions. IMPRESSION: 1. Mild diffuse hepatic steatosis. 2. Otherwise normal MRI of the abdomen. Normal gallbladder with no cholelithiasis. No biliary ductal dilatation. Electronically Signed   By: Delbert Phenix M.D.   On: 12/13/2014 08:28    Assessment & Plan:   Lynanne was seen today for gastroesophageal reflux.  Diagnoses and all orders for this visit:  Essential hypertension, benign- her BP is well controlled -     metoprolol succinate (TOPROL-XL) 50 MG 24 hr tablet; Take 1 tablet (50 mg total) by mouth daily. -     Basic metabolic panel; Future  Fatty liver disease, nonalcoholic- her WBC ct has gone up slightly but her ROS and exam are WNL, will follow for now. Her LFT's are normal now, she will work on her lifestyle modificatios -     CBC with Differential/Platelet; Future -     Hepatic function panel; Future  Gastroesophageal reflux disease with esophagitis- she feels better, her pain has resolved, will cont the PPI -     pantoprazole (PROTONIX) 40 MG tablet; Take 1 tablet (40  mg total) by mouth daily. -     CBC with Differential/Platelet; Future   I have discontinued Ms. Knouff's ciprofloxacin, metroNIDAZOLE, and promethazine. I am also having her start on pantoprazole. Additionally, I am having her maintain her acetaminophen, traMADol, and metoprolol succinate.  Meds ordered this encounter  Medications  . traMADol (ULTRAM) 50 MG tablet    Sig: TK 1 T PO  Q 6 H PRN    Refill:  3  .  metoprolol succinate (TOPROL-XL) 50 MG 24 hr tablet    Sig: Take 1 tablet (50 mg total) by mouth daily.    Dispense:  90 tablet    Refill:  2  . pantoprazole (PROTONIX) 40 MG tablet    Sig: Take 1 tablet (40 mg total) by mouth daily.    Dispense:  90 tablet    Refill:  3     Follow-up: Return in about 6 months (around 06/20/2015).  Sanda Linger, MD

## 2014-12-20 NOTE — Patient Instructions (Signed)

## 2015-01-08 ENCOUNTER — Ambulatory Visit
Admission: RE | Admit: 2015-01-08 | Discharge: 2015-01-08 | Disposition: A | Discharge status: Home / Self Care | Payer: BLUE CROSS/BLUE SHIELD | Source: Ambulatory Visit

## 2015-01-08 DIAGNOSIS — Z1231 Encounter for screening mammogram for malignant neoplasm of breast: Secondary | ICD-10-CM

## 2015-01-09 ENCOUNTER — Other Ambulatory Visit: Payer: Self-pay

## 2015-01-09 ENCOUNTER — Other Ambulatory Visit: Payer: Self-pay | Admitting: Internal Medicine

## 2015-01-09 DIAGNOSIS — I10 Essential (primary) hypertension: Secondary | ICD-10-CM

## 2015-01-09 MED ORDER — METOPROLOL SUCCINATE ER 50 MG PO TB24: 50.0000 mg | ORAL_TABLET | Freq: Every day | ORAL | Status: DC

## 2015-02-11 ENCOUNTER — Ambulatory Visit: Payer: BLUE CROSS/BLUE SHIELD | Admitting: Internal Medicine

## 2015-02-12 ENCOUNTER — Telehealth: Payer: Self-pay | Admitting: *Deleted

## 2015-02-12 NOTE — Telephone Encounter (Signed)
Received call pt states she think she has an UTI wanting antibiotic call into pharmacy. Inform pt will need OV before md will rx something. Made appt for tomorrow @ 2...Raechel Chute/lmb

## 2015-02-13 ENCOUNTER — Ambulatory Visit: Payer: BLUE CROSS/BLUE SHIELD | Admitting: Internal Medicine

## 2015-02-13 ENCOUNTER — Ambulatory Visit (INDEPENDENT_AMBULATORY_CARE_PROVIDER_SITE_OTHER): Payer: BLUE CROSS/BLUE SHIELD | Admitting: Internal Medicine

## 2015-02-13 ENCOUNTER — Encounter: Payer: Self-pay | Admitting: Internal Medicine

## 2015-02-13 VITALS — BP 132/92 | HR 72 | Temp 98.5°F | Resp 12 | Ht 65.0 in | Wt 291.0 lb

## 2015-02-13 DIAGNOSIS — R300 Dysuria: Secondary | ICD-10-CM | POA: Diagnosis not present

## 2015-02-13 LAB — POCT URINALYSIS DIPSTICK
Bilirubin, UA: NEGATIVE
Glucose, UA: NEGATIVE
Ketones, UA: NEGATIVE
NITRITE UA: NEGATIVE
PH UA: 6
PROTEIN UA: NEGATIVE
Spec Grav, UA: 1.015
Urobilinogen, UA: NEGATIVE

## 2015-02-13 MED ORDER — NITROFURANTOIN MONOHYD MACRO 100 MG PO CAPS: 100.0000 mg | ORAL_CAPSULE | Freq: Two times a day (BID) | ORAL | Status: DC

## 2015-02-13 NOTE — Progress Notes (Signed)
   Subjective:    Patient ID: [name], female    DOB: 10/02/1965, 49 y.o.   MRN: [id number]  HPI The patient is a 49 YO female coming in for painful urination and blood in her urine. Started Monday night with some mild pressure. Then more urgency and pain with urination. Some mild blood in the urine this morning, no fevers or chills. No back pain. Tried drinking extra water and also taking some medicine over the counter which is not helping much.   Review of Systems  Constitutional: Negative for fever, activity change, appetite change, fatigue and unexpected weight change.  Respiratory: Negative.   Cardiovascular: Negative.   Gastrointestinal: Positive for abdominal pain. Negative for nausea, diarrhea, constipation and abdominal distention.  Genitourinary: Positive for dysuria, urgency and hematuria. Negative for frequency, flank pain, vaginal discharge, difficulty urinating and vaginal pain.  Musculoskeletal: Negative.       Objective:   Physical Exam  Constitutional: She appears well-developed and well-nourished.  HENT:  Head: Normocephalic and atraumatic.  Eyes: EOM are normal.  Cardiovascular: Normal rate and regular rhythm.   Pulmonary/Chest: Effort normal and breath sounds normal. No respiratory distress. She has no wheezes.  Abdominal: Soft. Bowel sounds are normal. She exhibits no distension. There is no tenderness. There is no rebound.  Mild suprapubic discomfort  Neurological: Coordination normal.  Skin: Skin is warm and dry.   Filed Vitals:   02/13/15 0804  BP: 132/92  Pulse: 72  Temp: 98.5 F (36.9 C)  TempSrc: Oral  Resp: 12  Height: 5\' 5"  (1.651 m)  Weight: 291 lb (131.997 kg)  SpO2: 97%      Assessment & Plan:

## 2015-02-13 NOTE — Patient Instructions (Signed)
We have sent in macrobid for the urinary infection. It is an antibiotic that works well for urine infections. Take 1 pill twice a day for 1 week.   Keep drinking extra fluids and call us if it does not go away.   Urinary Tract Infection Urinary tract infections (UTIs) can develop anywhere along your urinary tract. Your urinary tract is your body's drainage system for removing wastes and extra water. Your urinary tract includes two kidneys, two ureters, a bladder, and a urethra. Your kidneys are a pair of bean-shaped organs. Each kidney is about the size of your fist. They are located below your ribs, one on each side of your spine. CAUSES Infections are caused by microbes, which are microscopic organisms, including fungi, viruses, and bacteria. These organisms are so small that they can only be seen through a microscope. Bacteria are the microbes that most commonly cause UTIs. SYMPTOMS  Symptoms of UTIs may vary by age and gender of the patient and by the location of the infection. Symptoms in young women typically include a frequent and intense urge to urinate and a painful, burning feeling in the bladder or urethra during urination. Older women and men are more likely to be tired, shaky, and weak and have muscle aches and abdominal pain. A fever may mean the infection is in your kidneys. Other symptoms of a kidney infection include pain in your back or sides below the ribs, nausea, and vomiting. DIAGNOSIS To diagnose a UTI, your caregiver will ask you about your symptoms. Your caregiver will also ask you to provide a urine sample. The urine sample will be tested for bacteria and white blood cells. White blood cells are made by your body to help fight infection. TREATMENT  Typically, UTIs can be treated with medication. Because most UTIs are caused by a bacterial infection, they usually can be treated with the use of antibiotics. The choice of antibiotic and length of treatment depend on your symptoms  and the type of bacteria causing your infection. HOME CARE INSTRUCTIONS  If you were prescribed antibiotics, take them exactly as your caregiver instructs you. Finish the medication even if you feel better after you have only taken some of the medication.  Drink enough water and fluids to keep your urine clear or pale yellow.  Avoid caffeine, tea, and carbonated beverages. They tend to irritate your bladder.  Empty your bladder often. Avoid holding urine for long periods of time.  Empty your bladder before and after sexual intercourse.  After a bowel movement, women should cleanse from front to back. Use each tissue only once. SEEK MEDICAL CARE IF:   You have back pain.  You develop a fever.  Your symptoms do not begin to resolve within 3 days. SEEK IMMEDIATE MEDICAL CARE IF:   You have severe back pain or lower abdominal pain.  You develop chills.  You have nausea or vomiting.  You have continued burning or discomfort with urination. MAKE SURE YOU:   Understand these instructions.  Will watch your condition.  Will get help right away if you are not doing well or get worse.   This information is not intended to replace advice given to you by your health care provider. Make sure you discuss any questions you have with your health care provider.   Document Released: 12/03/2004 Document Revised: 11/14/2014 Document Reviewed: 04/03/2011 Elsevier Interactive Patient Education [employer]2016 [employer]

## 2015-02-13 NOTE — Assessment & Plan Note (Signed)
Appears to be simple cystitis. Treat with course of macrobid. Call back if no resolution.

## 2015-02-13 NOTE — Progress Notes (Signed)
Pre visit review using our clinic review tool, if applicable. No additional management support is needed unless otherwise documented below in the visit note. 

## 2015-03-10 LAB — HM PAP SMEAR: HM Pap smear: NORMAL

## 2015-03-21 ENCOUNTER — Other Ambulatory Visit: Payer: Self-pay | Admitting: Internal Medicine

## 2015-04-05 ENCOUNTER — Ambulatory Visit: Payer: BLUE CROSS/BLUE SHIELD | Admitting: Internal Medicine

## 2015-05-15 ENCOUNTER — Encounter: Payer: Self-pay | Admitting: Internal Medicine

## 2015-05-15 ENCOUNTER — Ambulatory Visit (INDEPENDENT_AMBULATORY_CARE_PROVIDER_SITE_OTHER): Payer: BLUE CROSS/BLUE SHIELD | Admitting: Internal Medicine

## 2015-05-15 ENCOUNTER — Ambulatory Visit (INDEPENDENT_AMBULATORY_CARE_PROVIDER_SITE_OTHER)
Admission: RE | Admit: 2015-05-15 | Discharge: 2015-05-15 | Disposition: A | Discharge status: Home / Self Care | Payer: BLUE CROSS/BLUE SHIELD | Source: Ambulatory Visit | Attending: Internal Medicine | Admitting: Internal Medicine

## 2015-05-15 VITALS — BP 120/80 | HR 62 | Temp 98.2°F | Resp 16 | Ht 65.0 in | Wt 290.0 lb

## 2015-05-15 DIAGNOSIS — R079 Chest pain, unspecified: Secondary | ICD-10-CM | POA: Insufficient documentation

## 2015-05-15 MED ORDER — TRAMADOL HCL 50 MG PO TABS: ORAL_TABLET | ORAL | Status: DC

## 2015-05-15 NOTE — Progress Notes (Signed)
Pre visit review using our clinic review tool, if applicable. No additional management support is needed unless otherwise documented below in the visit note. 

## 2015-05-15 NOTE — Patient Instructions (Signed)

## 2015-05-15 NOTE — Progress Notes (Signed)
Subjective:  Patient ID: [name], female    DOB: 04/30/1965  Age: 50 y.o. MRN: [id number]  CC: Chest Pain   HPI Trenace Coughlin presents for a one-week history of sharp, stabbing pain in her left posterior chest around her shoulder blade that radiates towards the front of her chest. She says the pain occurs when she lays down and moves around. The pain is not pleuritic. She denies coughing, shortness of breath, dyspnea on exertion. She has had no recent trauma or injury. She has taken doses of Advil and a muscle relaxer which has alleviated most of the pain. She does not feel like she can return to work at this time due to the discomfort.  Outpatient Prescriptions Prior to Visit  Medication Sig Dispense Refill  . acetaminophen (TYLENOL) 500 MG tablet Take 1 tablet (500 mg total) by mouth every 8 (eight) hours as needed. For pain 30 tablet 0  . methocarbamol (ROBAXIN) 750 MG tablet TAKE 1 TABLET (750 MG TOTAL) BY MOUTH EVERY 8 (EIGHT) HOURS AS NEEDED FOR MUSCLE SPASMS. 90 tablet 2  . metoprolol succinate (TOPROL-XL) 50 MG 24 hr tablet Take 1 tablet (50 mg total) by mouth daily. 90 tablet 2  . nitrofurantoin, macrocrystal-monohydrate, (MACROBID) 100 MG capsule Take 1 capsule (100 mg total) by mouth 2 (two) times daily. 14 capsule 0  . pantoprazole (PROTONIX) 40 MG tablet Take 1 tablet (40 mg total) by mouth daily. 90 tablet 3  . traMADol (ULTRAM) 50 MG tablet TK 1 T PO  Q 6 H PRN  3   No facility-administered medications prior to visit.    ROS Review of Systems  Constitutional: Negative.  Negative for fever, chills, diaphoresis, appetite change and fatigue.  HENT: Negative.  Negative for sinus pressure, sore throat and trouble swallowing.   Eyes: Negative.  Negative for visual disturbance.  Respiratory: Negative.  Negative for cough, choking, chest tightness, shortness of breath and stridor.   Cardiovascular: Positive for chest pain. Negative for palpitations and leg swelling.    Gastrointestinal: Negative.  Negative for nausea, vomiting, abdominal pain, diarrhea, constipation and blood in stool.  Endocrine: Negative.   Genitourinary: Negative.  Negative for dysuria, urgency, frequency, hematuria, flank pain, difficulty urinating and pelvic pain.  Musculoskeletal: Negative.  Negative for myalgias, back pain, joint swelling and arthralgias.  Skin: Negative.  Negative for color change and rash.  Allergic/Immunologic: Negative.   Neurological: Negative.  Negative for dizziness, tremors, weakness, light-headedness, numbness and headaches.  Hematological: Negative.  Negative for adenopathy. Does not bruise/bleed easily.  Psychiatric/Behavioral: Negative.     Objective:  BP 120/80 mmHg  Pulse 62  Temp(Src) 98.2 F (36.8 C) (Oral)  Resp 16  Ht  (1.651 m)  Wt 290 lb (131.543 kg)  BMI 48.26 kg/m2  SpO2 96%  BP Readings from Last 3 Encounters:  05/15/15 120/80  02/13/15 132/92  12/20/14 118/80    Wt Readings from Last 3 Encounters:  05/15/15 290 lb (131.543 kg)  02/13/15 291 lb (131.997 kg)  12/20/14 294 lb (133.358 kg)    Physical Exam  Constitutional: She is oriented to person, place, and time. She appears well-developed and well-nourished.  Non-toxic appearance. She does not have a sickly appearance. She does not appear ill. No distress.  HENT:  Mouth/Throat: Oropharynx is clear and moist. No oropharyngeal exudate.  Eyes: Conjunctivae are normal. Right eye exhibits no discharge. Left eye exhibits no discharge. No scleral icterus.  Neck: Normal range of motion. Neck supple. No JVD  present. No tracheal deviation present. No thyromegaly present.  Cardiovascular: Normal rate, regular rhythm, normal heart sounds and intact distal pulses.  Exam reveals no gallop and no friction rub.   No murmur heard. Pulses:      Carotid pulses are 1+ on the right side, and 1+ on the left side.      Radial pulses are 1+ on the right side, and 1+ on the left side.        Femoral pulses are 1+ on the right side, and 1+ on the left side.      Popliteal pulses are 1+ on the right side, and 1+ on the left side.       Dorsalis pedis pulses are 1+ on the right side, and 1+ on the left side.       Posterior tibial pulses are 1+ on the right side, and 1+ on the left side.  EKG -  Sinus  Rhythm  WITHIN NORMAL LIMITS  Pulmonary/Chest: Effort normal. No accessory muscle usage or stridor. No respiratory distress. She has no decreased breath sounds. She has no wheezes. She has no rhonchi. She has no rales.   She exhibits no tenderness.  Abdominal: Soft. Bowel sounds are normal. She exhibits no distension and no mass. There is no tenderness. There is no rebound and no guarding.  Musculoskeletal: Normal range of motion. She exhibits no edema or tenderness.  Lymphadenopathy:    She has no cervical adenopathy.  Neurological: She is oriented to person, place, and time.  Skin: Skin is warm and dry. No rash noted. She is not diaphoretic. No erythema. No pallor.  Vitals reviewed.   Lab Results  Component Value Date   WBC 13.5* 12/20/2014   HGB 13.3 12/20/2014   HCT 40.8 12/20/2014   PLT 326.0 12/20/2014   GLUCOSE 95 12/20/2014   CHOL 205* 03/14/2014   TRIG 108.0 03/14/2014   HDL 47.30 03/14/2014   LDLCALC 136* 03/14/2014   ALT 42* 12/20/2014   AST 21 12/20/2014   NA 140 12/20/2014   K 3.8 12/20/2014   CL 105 12/20/2014   CREATININE 0.65 12/20/2014   BUN 11 12/20/2014   CO2 28 12/20/2014   TSH 1.71 03/14/2014   INR 1.0 07/06/2007   HGBA1C 5.9 06/28/2013    Mm Screening Breast Tomo Bilateral  01/10/2015  CLINICAL DATA:  Screening. EXAM: DIGITAL SCREENING BILATERAL MAMMOGRAM WITH 3D TOMO WITH CAD COMPARISON:  Previous exam(s). ACR Breast Density Category b: There are scattered areas of fibroglandular density. FINDINGS: There are no findings suspicious for malignancy. Images were processed with CAD. IMPRESSION: No mammographic evidence of malignancy. A result  letter of this screening mammogram will be mailed directly to the patient. RECOMMENDATION: Screening mammogram in one year. (Code:SM-B-01Y) BI-RADS CATEGORY  1: Negative. Electronically Signed   By: Frederico HammanMichelle  Collins M.D.   On: 01/10/2015 12:42    Assessment & Plan:   [name] was seen today for chest pain.  Diagnoses and all orders for this visit:  Chest pain, unspecified chest pain type- the exam is consistent with musculoskeletal pain, her EKG is normal and her chest x-ray is normal, I do not see any concern for pulmonary or cardiac pathology. She will continue to control the pain with Advil, Robaxin, and tramadol as needed. She was given a work note to be out for the next 5 days. -     EKG 12-Lead -     DG Chest 2 View; Future -     traMADol (  ULTRAM) 50 MG tablet; TK 1 T PO  Q 6 H PRN   I am having Ms. Rodeheaver maintain her acetaminophen, pantoprazole, metoprolol succinate, nitrofurantoin (macrocrystal-monohydrate), methocarbamol, and traMADol.  Meds ordered this encounter  Medications  . traMADol (ULTRAM) 50 MG tablet    Sig: TK 1 T PO  Q 6 H PRN    Dispense:  35 tablet    Refill:  0     Follow-up: Return in about 3 weeks (around 06/05/2015).  Sanda Linger, MD

## 2015-05-16 ENCOUNTER — Encounter: Payer: Self-pay | Admitting: Internal Medicine

## 2015-06-20 ENCOUNTER — Ambulatory Visit (INDEPENDENT_AMBULATORY_CARE_PROVIDER_SITE_OTHER): Payer: BLUE CROSS/BLUE SHIELD | Admitting: Internal Medicine

## 2015-06-20 ENCOUNTER — Other Ambulatory Visit (INDEPENDENT_AMBULATORY_CARE_PROVIDER_SITE_OTHER): Payer: BLUE CROSS/BLUE SHIELD

## 2015-06-20 ENCOUNTER — Encounter: Payer: Self-pay | Admitting: Internal Medicine

## 2015-06-20 VITALS — BP 128/80 | HR 66 | Temp 98.5°F | Resp 16 | Ht 65.0 in | Wt 288.0 lb

## 2015-06-20 DIAGNOSIS — R945 Abnormal results of liver function studies: Secondary | ICD-10-CM

## 2015-06-20 DIAGNOSIS — R7989 Other specified abnormal findings of blood chemistry: Secondary | ICD-10-CM

## 2015-06-20 DIAGNOSIS — Z0000 Encounter for general adult medical examination without abnormal findings: Secondary | ICD-10-CM | POA: Diagnosis not present

## 2015-06-20 DIAGNOSIS — I10 Essential (primary) hypertension: Secondary | ICD-10-CM | POA: Diagnosis not present

## 2015-06-20 DIAGNOSIS — D72829 Elevated white blood cell count, unspecified: Secondary | ICD-10-CM | POA: Diagnosis not present

## 2015-06-20 LAB — COMPREHENSIVE METABOLIC PANEL
ALT: 7 U/L (ref 0–35)
AST: 12 U/L (ref 0–37)
Albumin: 3.8 g/dL (ref 3.5–5.2)
Alkaline Phosphatase: 80 U/L (ref 39–117)
BUN: 8 mg/dL (ref 6–23)
CHLORIDE: 104 meq/L (ref 96–112)
CO2: 28 mEq/L (ref 19–32)
Calcium: 9.3 mg/dL (ref 8.4–10.5)
Creatinine, Ser: 0.61 mg/dL (ref 0.40–1.20)
GFR: 133.02 mL/min (ref >60.00)
GLUCOSE: 88 mg/dL (ref 70–99)
POTASSIUM: 3.8 meq/L (ref 3.5–5.1)
SODIUM: 139 meq/L (ref 135–145)
Total Bilirubin: 0.6 mg/dL (ref 0.2–1.2)
Total Protein: 7.5 g/dL (ref 6.0–8.3)

## 2015-06-20 LAB — LIPID PANEL
Cholesterol: 193 mg/dL (ref 0–200)
HDL: 49.8 mg/dL (ref >39.00)
LDL CALC: 128 mg/dL — AB (ref 0–99)
NONHDL: 143.56
Total CHOL/HDL Ratio: 4
Triglycerides: 77 mg/dL (ref 0.0–149.0)
VLDL: 15.4 mg/dL (ref 0.0–40.0)

## 2015-06-20 LAB — CBC WITH DIFFERENTIAL/PLATELET
BASOS PCT: 1 % (ref 0.0–3.0)
Basophils Absolute: 0.1 10*3/uL (ref 0.0–0.1)
EOS PCT: 2.2 % (ref 0.0–5.0)
Eosinophils Absolute: 0.3 10*3/uL (ref 0.0–0.7)
HCT: 38.1 % (ref 36.0–46.0)
HEMOGLOBIN: 12.8 g/dL (ref 12.0–15.0)
Lymphocytes Relative: 38.1 % (ref 12.0–46.0)
Lymphs Abs: 4.6 10*3/uL — ABNORMAL HIGH (ref 0.7–4.0)
MCHC: 33.5 g/dL (ref 30.0–36.0)
MCV: 83.1 fl (ref 78.0–100.0)
MONO ABS: 0.9 10*3/uL (ref 0.1–1.0)
Monocytes Relative: 7.1 % (ref 3.0–12.0)
Neutro Abs: 6.3 10*3/uL (ref 1.4–7.7)
Neutrophils Relative %: 51.6 % (ref 43.0–77.0)
Platelets: 285 10*3/uL (ref 150.0–400.0)
RBC: 4.59 Mil/uL (ref 3.87–5.11)
RDW: 14.5 % (ref 11.5–15.5)
WBC: 12.2 10*3/uL — AB (ref 4.0–10.5)

## 2015-06-20 LAB — URINALYSIS, ROUTINE W REFLEX MICROSCOPIC
BILIRUBIN URINE: NEGATIVE
Ketones, ur: NEGATIVE
Leukocytes, UA: NEGATIVE
NITRITE: NEGATIVE
Specific Gravity, Urine: 1.01 (ref 1.000–1.030)
Total Protein, Urine: NEGATIVE
Urine Glucose: NEGATIVE
Urobilinogen, UA: 0.2 (ref 0.0–1.0)
pH: 6 (ref 5.0–8.0)

## 2015-06-20 LAB — HEPATIC FUNCTION PANEL
ALT: 7 U/L (ref 0–35)
AST: 12 U/L (ref 0–37)
Albumin: 3.8 g/dL (ref 3.5–5.2)
Alkaline Phosphatase: 80 U/L (ref 39–117)
Bilirubin, Direct: 0.1 mg/dL (ref 0.0–0.3)
Total Bilirubin: 0.6 mg/dL (ref 0.2–1.2)
Total Protein: 7.5 g/dL (ref 6.0–8.3)

## 2015-06-20 LAB — TSH: TSH: 1.25 u[IU]/mL (ref 0.35–4.50)

## 2015-06-20 NOTE — Progress Notes (Signed)
Subjective:  Patient ID: [name], female    DOB: 08/04/1965  Age: 50 y.o. MRN: [id number]  CC: Hypertension and Annual Exam   HPI [name] presents for a CPX - she feels well and offers no complaints. She tells me that her BP has been well controlled, she denies CP/SOB/DOE/palpitations. She is being followed for an elevated WBC ct, she does not smoke and denies NS/sore throat/rash/LAD/weight loss/cough.  Outpatient Prescriptions Prior to Visit  Medication Sig Dispense Refill  . acetaminophen (TYLENOL) 500 MG tablet Take 1 tablet (500 mg total) by mouth every 8 (eight) hours as needed. For pain 30 tablet 0  . methocarbamol (ROBAXIN) 750 MG tablet TAKE 1 TABLET (750 MG TOTAL) BY MOUTH EVERY 8 (EIGHT) HOURS AS NEEDED FOR MUSCLE SPASMS. 90 tablet 2  . metoprolol succinate (TOPROL-XL) 50 MG 24 hr tablet Take 1 tablet (50 mg total) by mouth daily. 90 tablet 2  . pantoprazole (PROTONIX) 40 MG tablet Take 1 tablet (40 mg total) by mouth daily. 90 tablet 3  . traMADol (ULTRAM) 50 MG tablet TK 1 T PO  Q 6 H PRN 35 tablet 0  . nitrofurantoin, macrocrystal-monohydrate, (MACROBID) 100 MG capsule Take 1 capsule (100 mg total) by mouth 2 (two) times daily. 14 capsule 0   No facility-administered medications prior to visit.    ROS Review of Systems  Constitutional: Negative.  Negative for fever, chills, diaphoresis, appetite change and fatigue.  HENT: Negative.  Negative for trouble swallowing.   Eyes: Negative.   Respiratory: Negative.  Negative for cough, choking, chest tightness, shortness of breath and stridor.   Cardiovascular: Negative.  Negative for chest pain, palpitations and leg swelling.  Gastrointestinal: Negative.  Negative for nausea, vomiting, abdominal pain, diarrhea and constipation.  Endocrine: Negative.   Genitourinary: Negative.   Musculoskeletal: Negative.  Negative for myalgias, back pain, joint swelling and arthralgias.  Skin: Positive for rash.    Allergic/Immunologic: Negative.   Neurological: Negative.  Negative for dizziness, tremors, syncope, light-headedness, numbness and headaches.  Hematological: Negative.  Negative for adenopathy. Does not bruise/bleed easily.  Psychiatric/Behavioral: Negative.     Objective:  BP 128/80 mmHg  Pulse 66  Temp(Src) 98.5 F (36.9 C) (Oral)  Resp 16  Ht  (1.651 m)  Wt 288 lb (130.636 kg)  BMI 47.93 kg/m2  SpO2 97%  BP Readings from Last 3 Encounters:  06/20/15 128/80  05/15/15 120/80  02/13/15 132/92    Wt Readings from Last 3 Encounters:  06/20/15 288 lb (130.636 kg)  05/15/15 290 lb (131.543 kg)  02/13/15 291 lb (131.997 kg)    Physical Exam  Constitutional: She is oriented to person, place, and time. She appears well-developed and well-nourished. No distress.  HENT:  Head: Normocephalic and atraumatic.  Mouth/Throat: Oropharynx is clear and moist. No oropharyngeal exudate.  Eyes: Conjunctivae are normal. Right eye exhibits no discharge. Left eye exhibits no discharge. No scleral icterus.  Neck: Normal range of motion. Neck supple. No JVD present. No tracheal deviation present. No thyromegaly present.  Cardiovascular: Normal rate, regular rhythm, normal heart sounds and intact distal pulses.  Exam reveals no gallop and no friction rub.   No murmur heard. Pulmonary/Chest: Effort normal and breath sounds normal. No stridor. No respiratory distress. She has no wheezes. She has no rales. She exhibits no tenderness.  Abdominal: Soft. Bowel sounds are normal. She exhibits no distension and no mass. There is no tenderness. There is no rebound and no guarding.  Musculoskeletal: Normal range  of motion. She exhibits no edema or tenderness.  Lymphadenopathy:    She has no cervical adenopathy.  Neurological: She is oriented to person, place, and time.  Skin: Skin is warm and dry. No rash noted. She is not diaphoretic. No erythema. No pallor.  Vitals reviewed.   Lab Results   Component Value Date   WBC 12.2* 06/20/2015   HGB 12.8 06/20/2015   HCT 38.1 06/20/2015   PLT 285.0 06/20/2015   GLUCOSE 88 06/20/2015   CHOL 193 06/20/2015   TRIG 77.0 06/20/2015   HDL 49.80 06/20/2015   LDLCALC 128* 06/20/2015   ALT 7 06/20/2015   ALT 7 06/20/2015   AST 12 06/20/2015   AST 12 06/20/2015   NA 139 06/20/2015   K 3.8 06/20/2015   CL 104 06/20/2015   CREATININE 0.61 06/20/2015   BUN 8 06/20/2015   CO2 28 06/20/2015   TSH 1.25 06/20/2015   INR 1.0 07/06/2007   HGBA1C 5.9 06/28/2013    Dg Chest 2 View  05/16/2015  CLINICAL DATA:  Two week history of left posterior chest pain with dyspnea on exertion; history of morbid obesity, hypertension, sleep apnea, nonsmoker. EXAM: CHEST  2 VIEW COMPARISON:  Chest x-ray of December 11, 2014 FINDINGS: The lungs are well-expanded. The interstitial markings are coarse bilaterally but stable. There is stable linear scarring peripherally in the left lower lung. There is no alveolar infiltrate. There is no pleural effusion. There is no pneumothorax or pneumomediastinum. The heart is top-normal in size. The central pulmonary vascularity is prominent though stable. The mediastinum is normal in width. The bony thorax exhibits no acute abnormality. IMPRESSION: Chronic pulmonary interstitial prominence may reflect low-grade compensated CHF or mild fibrotic change. These findings are stable. There is no acute cardiopulmonary disease. Electronically Signed   By: David  SwazilandJordan M.D.   On: 05/16/2015 08:15    Assessment & Plan:   [name] was seen today for hypertension and annual exam.  Diagnoses and all orders for this visit:  Leukocytosis-  Her WBC count has trended down recently, the remainder of her cell lines are normal, she is asymptomatic and her total protein level is within normal limits. I think this is a benign issue and will continue to follow.  Routine general medical examination at a health care facility- exam completed, labs  ordered and reviewed, vaccines reviewed and updated, her mammogram and Pap smear up-to-date, her colonoscopy is up-to-date, patient education material was given. -     Lipid panel; Future -     Comprehensive metabolic panel; Future -     CBC with Differential/Platelet; Future -     TSH; Future -     Urinalysis, Routine w reflex microscopic ([facility]); Future  Essential hypertension, benign- her blood pressure is well-controlled, electrolytes and renal function are stable.   I have discontinued Ms. Turner's nitrofurantoin (macrocrystal-monohydrate). I am also having her maintain her acetaminophen, pantoprazole, metoprolol succinate, methocarbamol, and traMADol.  No orders of the defined types were placed in this encounter.     Follow-up: Return if symptoms worsen or fail to improve.  Sanda Lingerhomas Bron Snellings, MD

## 2015-06-20 NOTE — Patient Instructions (Signed)
Preventive Care for Adults, Female A healthy lifestyle and preventive care can promote health and wellness. Preventive health guidelines for women include the following key practices.  A routine yearly physical is a good way to check with your health care provider about your health and preventive screening. It is a chance to share any concerns and updates on your health and to receive a thorough exam.  Visit your dentist for a routine exam and preventive care every 6 months. Brush your teeth twice a day and floss once a day. Good oral hygiene prevents tooth decay and gum disease.  The frequency of eye exams is based on your age, health, family medical history, use of contact lenses, and other factors. Follow your health care provider's recommendations for frequency of eye exams.  Eat a healthy diet. Foods like vegetables, fruits, whole grains, low-fat dairy products, and lean protein foods contain the nutrients you need without too many calories. Decrease your intake of foods high in solid fats, added sugars, and salt. Eat the right amount of calories for you.Get information about a proper diet from your health care provider, if necessary.  Regular physical exercise is one of the most important things you can do for your health. Most adults should get at least 150 minutes of moderate-intensity exercise (any activity that increases your heart rate and causes you to sweat) each week. In addition, most adults need muscle-strengthening exercises on 2 or more days a week.  Maintain a healthy weight. The body mass index (BMI) is a screening tool to identify possible weight problems. It provides an estimate of body fat based on height and weight. Your health care provider can find your BMI and can help you achieve or maintain a healthy weight.For adults 20 years and older:  A BMI below 18.5 is considered underweight.  A BMI of 18.5 to 24.9 is normal.  A BMI of 25 to 29.9 is considered overweight.  A  BMI of 30 and above is considered obese.  Maintain normal blood lipids and cholesterol levels by exercising and minimizing your intake of saturated fat. Eat a balanced diet with plenty of fruit and vegetables. Blood tests for lipids and cholesterol should begin at age 6 and be repeated every 5 years. If your lipid or cholesterol levels are high, you are over 50, or you are at high risk for heart disease, you may need your cholesterol levels checked more frequently.Ongoing high lipid and cholesterol levels should be treated with medicines if diet and exercise are not working.  If you smoke, find out from your health care provider how to quit. If you do not use tobacco, do not start.  Lung cancer screening is recommended for adults aged 4-80 years who are at high risk for developing lung cancer because of a history of smoking. A yearly low-dose CT scan of the lungs is recommended for people who have at least a 30-pack-year history of smoking and are a current smoker or have quit within the past 15 years. A pack year of smoking is smoking an average of 1 pack of cigarettes a day for 1 year (for example: 1 pack a day for 30 years or 2 packs a day for 15 years). Yearly screening should continue until the smoker has stopped smoking for at least 15 years. Yearly screening should be stopped for people who develop a health problem that would prevent them from having lung cancer treatment.  If you are pregnant, do not drink alcohol. If you are  breastfeeding, be very cautious about drinking alcohol. If you are not pregnant and choose to drink alcohol, do not have more than 1 drink per day. One drink is considered to be 12 ounces (355 mL) of beer, 5 ounces (148 mL) of wine, or 1.5 ounces (44 mL) of liquor.  Avoid use of street drugs. Do not share needles with anyone. Ask for help if you need support or instructions about stopping the use of drugs.  High blood pressure causes heart disease and increases the risk  of stroke. Your blood pressure should be checked at least every 1 to 2 years. Ongoing high blood pressure should be treated with medicines if weight loss and exercise do not work.  If you are 39-77 years old, ask your health care provider if you should take aspirin to prevent strokes.  Diabetes screening is done by taking a blood sample to check your blood glucose level after you have not eaten for a certain period of time (fasting). If you are not overweight and you do not have risk factors for diabetes, you should be screened once every 3 years starting at age 76. If you are overweight or obese and you are 79-59 years of age, you should be screened for diabetes every year as part of your cardiovascular risk assessment.  Breast cancer screening is essential preventive care for women. You should practice "breast self-awareness." This means understanding the normal appearance and feel of your breasts and may include breast self-examination. Any changes detected, no matter how small, should be reported to a health care provider. Women in their 84s and 30s should have a clinical breast exam (CBE) by a health care provider as part of a regular health exam every 1 to 3 years. After age 74, women should have a CBE every year. Starting at age 37, women should consider having a mammogram (breast X-ray test) every year. Women who have a family history of breast cancer should talk to their health care provider about genetic screening. Women at a high risk of breast cancer should talk to their health care providers about having an MRI and a mammogram every year.  Breast cancer gene (BRCA)-related cancer risk assessment is recommended for women who have family members with BRCA-related cancers. BRCA-related cancers include breast, ovarian, tubal, and peritoneal cancers. Having family members with these cancers may be associated with an increased risk for harmful changes (mutations) in the breast cancer genes BRCA1 and  BRCA2. Results of the assessment will determine the need for genetic counseling and BRCA1 and BRCA2 testing.  Your health care provider may recommend that you be screened regularly for cancer of the pelvic organs (ovaries, uterus, and vagina). This screening involves a pelvic examination, including checking for microscopic changes to the surface of your cervix (Pap test). You may be encouraged to have this screening done every 3 years, beginning at age 72.  For women ages 9-65, health care providers may recommend pelvic exams and Pap testing every 3 years, or they may recommend the Pap and pelvic exam, combined with testing for human papilloma virus (HPV), every 5 years. Some types of HPV increase your risk of cervical cancer. Testing for HPV may also be done on women of any age with unclear Pap test results.  Other health care providers may not recommend any screening for nonpregnant women who are considered low risk for pelvic cancer and who do not have symptoms. Ask your health care provider if a screening pelvic exam is right for  you.  If you have had past treatment for cervical cancer or a condition that could lead to cancer, you need Pap tests and screening for cancer for at least 20 years after your treatment. If Pap tests have been discontinued, your risk factors (such as having a new sexual partner) need to be reassessed to determine if screening should resume. Some women have medical problems that increase the chance of getting cervical cancer. In these cases, your health care provider may recommend more frequent screening and Pap tests.  Colorectal cancer can be detected and often prevented. Most routine colorectal cancer screening begins at the age of 50 years and continues through age 75 years. However, your health care provider may recommend screening at an earlier age if you have risk factors for colon cancer. On a yearly basis, your health care provider may provide home test kits to check  for hidden blood in the stool. Use of a small camera at the end of a tube, to directly examine the colon (sigmoidoscopy or colonoscopy), can detect the earliest forms of colorectal cancer. Talk to your health care provider about this at age 50, when routine screening begins. Direct exam of the colon should be repeated every 5-10 years through age 75 years, unless early forms of precancerous polyps or small growths are found.  People who are at an increased risk for hepatitis B should be screened for this virus. You are considered at high risk for hepatitis B if:  You were born in a country where hepatitis B occurs often. Talk with your health care provider about which countries are considered high risk.  Your parents were born in a high-risk country and you have not received a shot to protect against hepatitis B (hepatitis B vaccine).  You have HIV or AIDS.  You use needles to inject street drugs.  You live with, or have sex with, someone who has hepatitis B.  You get hemodialysis treatment.  You take certain medicines for conditions like cancer, organ transplantation, and autoimmune conditions.  Hepatitis C blood testing is recommended for all people born from 1945 through 1965 and any individual with known risks for hepatitis C.  Practice safe sex. Use condoms and avoid high-risk sexual practices to reduce the spread of sexually transmitted infections (STIs). STIs include gonorrhea, chlamydia, syphilis, trichomonas, herpes, HPV, and human immunodeficiency virus (HIV). Herpes, HIV, and HPV are viral illnesses that have no cure. They can result in disability, cancer, and death.  You should be screened for sexually transmitted illnesses (STIs) including gonorrhea and chlamydia if:  You are sexually active and are younger than 24 years.  You are older than 24 years and your health care provider tells you that you are at risk for this type of infection.  Your sexual activity has changed  since you were last screened and you are at an increased risk for chlamydia or gonorrhea. Ask your health care provider if you are at risk.  If you are at risk of being infected with HIV, it is recommended that you take a prescription medicine daily to prevent HIV infection. This is called preexposure prophylaxis (PrEP). You are considered at risk if:  You are sexually active and do not regularly use condoms or know the HIV status of your partner(s).  You take drugs by injection.  You are sexually active with a partner who has HIV.  Talk with your health care provider about whether you are at high risk of being infected with HIV. If   you choose to begin PrEP, you should first be tested for HIV. You should then be tested every 3 months for as long as you are taking PrEP.  Osteoporosis is a disease in which the bones lose minerals and strength with aging. This can result in serious bone fractures or breaks. The risk of osteoporosis can be identified using a bone density scan. Women ages 67 years and over and women at risk for fractures or osteoporosis should discuss screening with their health care providers. Ask your health care provider whether you should take a calcium supplement or vitamin D to reduce the rate of osteoporosis.  Menopause can be associated with physical symptoms and risks. Hormone replacement therapy is available to decrease symptoms and risks. You should talk to your health care provider about whether hormone replacement therapy is right for you.  Use sunscreen. Apply sunscreen liberally and repeatedly throughout the day. You should seek shade when your shadow is shorter than you. Protect yourself by wearing long sleeves, pants, a wide-brimmed hat, and sunglasses year round, whenever you are outdoors.  Once a month, do a whole body skin exam, using a mirror to look at the skin on your back. Tell your health care provider of new moles, moles that have irregular borders, moles that  are larger than a pencil eraser, or moles that have changed in shape or color.  Stay current with required vaccines (immunizations).  Influenza vaccine. All adults should be immunized every year.  Tetanus, diphtheria, and acellular pertussis (Td, Tdap) vaccine. Pregnant women should receive 1 dose of Tdap vaccine during each pregnancy. The dose should be obtained regardless of the length of time since the last dose. Immunization is preferred during the 27th-36th week of gestation. An adult who has not previously received Tdap or who does not know her vaccine status should receive 1 dose of Tdap. This initial dose should be followed by tetanus and diphtheria toxoids (Td) booster doses every 10 years. Adults with an unknown or incomplete history of completing a 3-dose immunization series with Td-containing vaccines should begin or complete a primary immunization series including a Tdap dose. Adults should receive a Td booster every 10 years.  Varicella vaccine. An adult without evidence of immunity to varicella should receive 2 doses or a second dose if she has previously received 1 dose. Pregnant females who do not have evidence of immunity should receive the first dose after pregnancy. This first dose should be obtained before leaving the health care facility. The second dose should be obtained 4-8 weeks after the first dose.  Human papillomavirus (HPV) vaccine. Females aged 13-26 years who have not received the vaccine previously should obtain the 3-dose series. The vaccine is not recommended for use in pregnant females. However, pregnancy testing is not needed before receiving a dose. If a female is found to be pregnant after receiving a dose, no treatment is needed. In that case, the remaining doses should be delayed until after the pregnancy. Immunization is recommended for any person with an immunocompromised condition through the age of 61 years if she did not get any or all doses earlier. During the  3-dose series, the second dose should be obtained 4-8 weeks after the first dose. The third dose should be obtained 24 weeks after the first dose and 16 weeks after the second dose.  Zoster vaccine. One dose is recommended for adults aged 30 years or older unless certain conditions are present.  Measles, mumps, and rubella (MMR) vaccine. Adults born  before 1957 generally are considered immune to measles and mumps. Adults born in 1957 or later should have 1 or more doses of MMR vaccine unless there is a contraindication to the vaccine or there is laboratory evidence of immunity to each of the three diseases. A routine second dose of MMR vaccine should be obtained at least 28 days after the first dose for students attending postsecondary schools, health care workers, or international travelers. People who received inactivated measles vaccine or an unknown type of measles vaccine during 1963-1967 should receive 2 doses of MMR vaccine. People who received inactivated mumps vaccine or an unknown type of mumps vaccine before 1979 and are at high risk for mumps infection should consider immunization with 2 doses of MMR vaccine. For females of childbearing age, rubella immunity should be determined. If there is no evidence of immunity, females who are not pregnant should be vaccinated. If there is no evidence of immunity, females who are pregnant should delay immunization until after pregnancy. Unvaccinated health care workers born before 1957 who lack laboratory evidence of measles, mumps, or rubella immunity or laboratory confirmation of disease should consider measles and mumps immunization with 2 doses of MMR vaccine or rubella immunization with 1 dose of MMR vaccine.  Pneumococcal 13-valent conjugate (PCV13) vaccine. When indicated, a person who is uncertain of his immunization history and has no record of immunization should receive the PCV13 vaccine. All adults 65 years of age and older should receive this  vaccine. An adult aged 19 years or older who has certain medical conditions and has not been previously immunized should receive 1 dose of PCV13 vaccine. This PCV13 should be followed with a dose of pneumococcal polysaccharide (PPSV23) vaccine. Adults who are at high risk for pneumococcal disease should obtain the PPSV23 vaccine at least 8 weeks after the dose of PCV13 vaccine. Adults older than 51 years of age who have normal immune system function should obtain the PPSV23 vaccine dose at least 1 year after the dose of PCV13 vaccine.  Pneumococcal polysaccharide (PPSV23) vaccine. When PCV13 is also indicated, PCV13 should be obtained first. All adults aged 65 years and older should be immunized. An adult younger than age 65 years who has certain medical conditions should be immunized. Any person who resides in a nursing home or long-term care facility should be immunized. An adult smoker should be immunized. People with an immunocompromised condition and certain other conditions should receive both PCV13 and PPSV23 vaccines. People with human immunodeficiency virus (HIV) infection should be immunized as soon as possible after diagnosis. Immunization during chemotherapy or radiation therapy should be avoided. Routine use of PPSV23 vaccine is not recommended for American Indians, [place] Natives, or people younger than 65 years unless there are medical conditions that require PPSV23 vaccine. When indicated, people who have unknown immunization and have no record of immunization should receive PPSV23 vaccine. One-time revaccination 5 years after the first dose of PPSV23 is recommended for people aged 19-64 years who have chronic kidney failure, nephrotic syndrome, asplenia, or immunocompromised conditions. People who received 1-2 doses of PPSV23 before age 65 years should receive another dose of PPSV23 vaccine at age 65 years or later if at least 5 years have passed since the previous dose. Doses of PPSV23 are not  needed for people immunized with PPSV23 at or after age 65 years.  Meningococcal vaccine. Adults with asplenia or persistent complement component deficiencies should receive 2 doses of quadrivalent meningococcal conjugate (MenACWY-D) vaccine. The doses should be obtained   at least 2 months apart. Microbiologists working with certain meningococcal bacteria, [place] recruits, people at risk during an outbreak, and people who travel to or live in countries with a high rate of meningitis should be immunized. A first-year college student up through age 34 years who is living in a residence hall should receive a dose if she did not receive a dose on or after her 16th birthday. Adults who have certain high-risk conditions should receive one or more doses of vaccine.  Hepatitis A vaccine. Adults who wish to be protected from this disease, have certain high-risk conditions, work with hepatitis A-infected animals, work in hepatitis A research labs, or travel to or work in countries with a high rate of hepatitis A should be immunized. Adults who were previously unvaccinated and who anticipate close contact with an international adoptee during the first 60 days after arrival in the [place] from a country with a high rate of hepatitis A should be immunized.  Hepatitis B vaccine. Adults who wish to be protected from this disease, have certain high-risk conditions, may be exposed to blood or other infectious body fluids, are household contacts or sex partners of hepatitis B positive people, are clients or workers in certain care facilities, or travel to or work in countries with a high rate of hepatitis B should be immunized.  Haemophilus influenzae type b (Hib) vaccine. A previously unvaccinated person with asplenia or sickle cell disease or having a scheduled splenectomy should receive 1 dose of Hib vaccine. Regardless of previous immunization, a recipient of a hematopoietic stem cell transplant should receive a  3-dose series 6-12 months after her successful transplant. Hib vaccine is not recommended for adults with HIV infection. Preventive Services / Frequency Ages 35 to 4 years  Blood pressure check.** / Every 3-5 years.  Lipid and cholesterol check.** / Every 5 years beginning at age 60.  Clinical breast exam.** / Every 3 years for women in their 71s and 10s.  BRCA-related cancer risk assessment.** / For women who have family members with a BRCA-related cancer (breast, ovarian, tubal, or peritoneal cancers).  Pap test.** / Every 2 years from ages 76 through 26. Every 3 years starting at age 61 through age 76 or 93 with a history of 3 consecutive normal Pap tests.  HPV screening.** / Every 3 years from ages 37 through ages 60 to 51 with a history of 3 consecutive normal Pap tests.  Hepatitis C blood test.** / For any individual with known risks for hepatitis C.  Skin self-exam. / Monthly.  Influenza vaccine. / Every year.  Tetanus, diphtheria, and acellular pertussis (Tdap, Td) vaccine.** / Consult your health care provider. Pregnant women should receive 1 dose of Tdap vaccine during each pregnancy. 1 dose of Td every 10 years.  Varicella vaccine.** / Consult your health care provider. Pregnant females who do not have evidence of immunity should receive the first dose after pregnancy.  HPV vaccine. / 3 doses over 6 months, if 93 and younger. The vaccine is not recommended for use in pregnant females. However, pregnancy testing is not needed before receiving a dose.  Measles, mumps, rubella (MMR) vaccine.** / You need at least 1 dose of MMR if you were born in 1957 or later. You may also need a 2nd dose. For females of childbearing age, rubella immunity should be determined. If there is no evidence of immunity, females who are not pregnant should be vaccinated. If there is no evidence of immunity, females who are  pregnant should delay immunization until after pregnancy.  Pneumococcal  13-valent conjugate (PCV13) vaccine.** / Consult your health care provider.  Pneumococcal polysaccharide (PPSV23) vaccine.** / 1 to 2 doses if you smoke cigarettes or if you have certain conditions.  Meningococcal vaccine.** / 1 dose if you are age 68 to 8 years and a Market researcher living in a residence hall, or have one of several medical conditions, you need to get vaccinated against meningococcal disease. You may also need additional booster doses.  Hepatitis A vaccine.** / Consult your health care provider.  Hepatitis B vaccine.** / Consult your health care provider.  Haemophilus influenzae type b (Hib) vaccine.** / Consult your health care provider. Ages 7 to 53 years  Blood pressure check.** / Every year.  Lipid and cholesterol check.** / Every 5 years beginning at age 25 years.  Lung cancer screening. / Every year if you are aged 11-80 years and have a 30-pack-year history of smoking and currently smoke or have quit within the past 15 years. Yearly screening is stopped once you have quit smoking for at least 15 years or develop a health problem that would prevent you from having lung cancer treatment.  Clinical breast exam.** / Every year after age 48 years.  BRCA-related cancer risk assessment.** / For women who have family members with a BRCA-related cancer (breast, ovarian, tubal, or peritoneal cancers).  Mammogram.** / Every year beginning at age 41 years and continuing for as long as you are in good health. Consult with your health care provider.  Pap test.** / Every 3 years starting at age 65 years through age 37 or 70 years with a history of 3 consecutive normal Pap tests.  HPV screening.** / Every 3 years from ages 72 years through ages 60 to 40 years with a history of 3 consecutive normal Pap tests.  Fecal occult blood test (FOBT) of stool. / Every year beginning at age 21 years and continuing until age 5 years. You may not need to do this test if you get  a colonoscopy every 10 years.  Flexible sigmoidoscopy or colonoscopy.** / Every 5 years for a flexible sigmoidoscopy or every 10 years for a colonoscopy beginning at age 35 years and continuing until age 48 years.  Hepatitis C blood test.** / For all people born from [birth year] through 1965 and any individual with known risks for hepatitis C.  Skin self-exam. / Monthly.  Influenza vaccine. / Every year.  Tetanus, diphtheria, and acellular pertussis (Tdap/Td) vaccine.** / Consult your health care provider. Pregnant women should receive 1 dose of Tdap vaccine during each pregnancy. 1 dose of Td every 10 years.  Varicella vaccine.** / Consult your health care provider. Pregnant females who do not have evidence of immunity should receive the first dose after pregnancy.  Zoster vaccine.** / 1 dose for adults aged 30 years or older.  Measles, mumps, rubella (MMR) vaccine.** / You need at least 1 dose of MMR if you were born in 1957 or later. You may also need a second dose. For females of childbearing age, rubella immunity should be determined. If there is no evidence of immunity, females who are not pregnant should be vaccinated. If there is no evidence of immunity, females who are pregnant should delay immunization until after pregnancy.  Pneumococcal 13-valent conjugate (PCV13) vaccine.** / Consult your health care provider.  Pneumococcal polysaccharide (PPSV23) vaccine.** / 1 to 2 doses if you smoke cigarettes or if you have certain conditions.  Meningococcal vaccine.** /  Consult your health care provider.  Hepatitis A vaccine.** / Consult your health care provider.  Hepatitis B vaccine.** / Consult your health care provider.  Haemophilus influenzae type b (Hib) vaccine.** / Consult your health care provider. Ages 72 years and over  Blood pressure check.** / Every year.  Lipid and cholesterol check.** / Every 5 years beginning at age 32 years.  Lung cancer screening. / Every year if you  are aged 64-80 years and have a 30-pack-year history of smoking and currently smoke or have quit within the past 15 years. Yearly screening is stopped once you have quit smoking for at least 15 years or develop a health problem that would prevent you from having lung cancer treatment.  Clinical breast exam.** / Every year after age 25 years.  BRCA-related cancer risk assessment.** / For women who have family members with a BRCA-related cancer (breast, ovarian, tubal, or peritoneal cancers).  Mammogram.** / Every year beginning at age 69 years and continuing for as long as you are in good health. Consult with your health care provider.  Pap test.** / Every 3 years starting at age 35 years through age 11 or 84 years with 3 consecutive normal Pap tests. Testing can be stopped between 65 and 70 years with 3 consecutive normal Pap tests and no abnormal Pap or HPV tests in the past 10 years.  HPV screening.** / Every 3 years from ages 54 years through ages 54 or 8 years with a history of 3 consecutive normal Pap tests. Testing can be stopped between 65 and 70 years with 3 consecutive normal Pap tests and no abnormal Pap or HPV tests in the past 10 years.  Fecal occult blood test (FOBT) of stool. / Every year beginning at age 7 years and continuing until age 42 years. You may not need to do this test if you get a colonoscopy every 10 years.  Flexible sigmoidoscopy or colonoscopy.** / Every 5 years for a flexible sigmoidoscopy or every 10 years for a colonoscopy beginning at age 6 years and continuing until age 23 years.  Hepatitis C blood test.** / For all people born from [birth year] through 1966 and any individual with known risks for hepatitis C.  Osteoporosis screening.** / A one-time screening for women ages 60 years and over and women at risk for fractures or osteoporosis.  Skin self-exam. / Monthly.  Influenza vaccine. / Every year.  Tetanus, diphtheria, and acellular pertussis (Tdap/Td)  vaccine.** / 1 dose of Td every 10 years.  Varicella vaccine.** / Consult your health care provider.  Zoster vaccine.** / 1 dose for adults aged 84 years or older.  Pneumococcal 13-valent conjugate (PCV13) vaccine.** / Consult your health care provider.  Pneumococcal polysaccharide (PPSV23) vaccine.** / 1 dose for all adults aged 10 years and older.  Meningococcal vaccine.** / Consult your health care provider.  Hepatitis A vaccine.** / Consult your health care provider.  Hepatitis B vaccine.** / Consult your health care provider.  Haemophilus influenzae type b (Hib) vaccine.** / Consult your health care provider. ** Family history and personal history of risk and conditions may change your health care provider's recommendations.   This information is not intended to replace advice given to you by your health care provider. Make sure you discuss any questions you have with your health care provider.   Document Released: 04/21/2001 Document Revised: 03/16/2014 Document Reviewed: 07/21/2010 Elsevier Interactive Patient Education [employer].

## 2015-06-20 NOTE — Progress Notes (Signed)
Pre visit review using our clinic review tool, if applicable. No additional management support is needed unless otherwise documented below in the visit note. 

## 2015-06-25 NOTE — Progress Notes (Signed)
Quick Note:  Liver tests back to normal - My Chart note sent - f/u GI prn ______

## 2015-06-28 ENCOUNTER — Telehealth: Payer: Self-pay | Admitting: Internal Medicine

## 2015-06-28 NOTE — Telephone Encounter (Signed)
Pt request to speak to the assistant concern about lab result. Please give her a call  # [phone]

## 2015-07-01 NOTE — Telephone Encounter (Signed)
Pt informed

## 2015-07-15 ENCOUNTER — Other Ambulatory Visit: Payer: Self-pay | Admitting: Internal Medicine

## 2015-07-16 NOTE — Telephone Encounter (Signed)
PCP out of office please advise  

## 2015-07-16 NOTE — Telephone Encounter (Signed)
F/u w/PCP pls

## 2015-07-17 NOTE — Telephone Encounter (Signed)
Called in.

## 2015-08-16 ENCOUNTER — Emergency Department (HOSPITAL_BASED_OUTPATIENT_CLINIC_OR_DEPARTMENT_OTHER)
Admission: EM | Admit: 2015-08-16 | Discharge: 2015-08-16 | Disposition: A | Discharge status: Home / Self Care | Payer: BLUE CROSS/BLUE SHIELD | Attending: Emergency Medicine | Admitting: Emergency Medicine

## 2015-08-16 ENCOUNTER — Encounter (HOSPITAL_BASED_OUTPATIENT_CLINIC_OR_DEPARTMENT_OTHER): Payer: Self-pay | Admitting: *Deleted

## 2015-08-16 DIAGNOSIS — I10 Essential (primary) hypertension: Secondary | ICD-10-CM | POA: Diagnosis not present

## 2015-08-16 DIAGNOSIS — N390 Urinary tract infection, site not specified: Secondary | ICD-10-CM

## 2015-08-16 DIAGNOSIS — Z6837 Body mass index (BMI) 37.0-37.9, adult: Secondary | ICD-10-CM | POA: Diagnosis not present

## 2015-08-16 DIAGNOSIS — M1990 Unspecified osteoarthritis, unspecified site: Secondary | ICD-10-CM | POA: Diagnosis not present

## 2015-08-16 DIAGNOSIS — R109 Unspecified abdominal pain: Secondary | ICD-10-CM | POA: Diagnosis present

## 2015-08-16 LAB — URINALYSIS, ROUTINE W REFLEX MICROSCOPIC
BILIRUBIN URINE: NEGATIVE
Glucose, UA: NEGATIVE mg/dL
KETONES UR: NEGATIVE mg/dL
NITRITE: NEGATIVE
PROTEIN: NEGATIVE mg/dL
Specific Gravity, Urine: 1.012 (ref 1.005–1.030)
pH: 6 (ref 5.0–8.0)

## 2015-08-16 LAB — URINE MICROSCOPIC-ADD ON

## 2015-08-16 MED ORDER — HYDROCODONE-ACETAMINOPHEN 5-325 MG PO TABS: 1.0000 | ORAL_TABLET | Freq: Four times a day (QID) | ORAL | Status: DC | PRN

## 2015-08-16 MED ORDER — SULFAMETHOXAZOLE-TRIMETHOPRIM 800-160 MG PO TABS
1.0000 | ORAL_TABLET | Freq: Once | ORAL | Status: AC
  Administered 2015-08-16: 1 via ORAL
  Filled 2015-08-16: qty 1

## 2015-08-16 MED ORDER — SULFAMETHOXAZOLE-TRIMETHOPRIM 800-160 MG PO TABS: 1.0000 | ORAL_TABLET | Freq: Two times a day (BID) | ORAL | Status: DC

## 2015-08-16 NOTE — ED Provider Notes (Addendum)
CSN: [id number]     Arrival date & time 08/16/15  0013 History   First MD Initiated Contact with Patient 08/16/15 0117     Chief Complaint  Patient presents with  . Flank Pain     (Consider location/radiation/quality/duration/timing/severity/associated sxs/prior Treatment) HPI  This is a 50 year old female with a two-day history of back pain. The pain originates in the mid to lower back and radiates around to her left lower quadrant and right lower quadrant. She denies injury or heavy lifting. It is moderate and worse with movement. She took tramadol without relief. She has also had a one-week history of urinary frequency and the feeling of incomplete voiding. She has been self-medicating with cranberry extract. She denies fever or chills. She denies nausea or vomiting. She denies hematuria.  Past Medical History  Diagnosis Date  . Hypertension   . Sleep apnea        . GERD (gastroesophageal reflux disease)   . Arthritis   . Morbid obesity (HCC)   . Hiatal hernia   . Migraines    Past Surgical History  Procedure Laterality Date  . Abdominal hysterectomy    . Cardiovascular stress test  07/25/2007    Negative stress echo; ef 55-60%  . Knee arthroscopy  01/2011    left  . Esophageal manometry  03/07/2012    Procedure: ESOPHAGEAL MANOMETRY (EM);  Surgeon: Mardella Layman, MD;  Location: [facility];  Service: Endoscopy;  Laterality: N/A;  . Knee surgery    . Abdominal hysterectomy  2003   Family History  Problem Relation Age of Onset  . Cervical cancer Mother   . Hypertension Sister   . Diabetes Sister   . Heart disease Neg Hx   . Cancer Neg Hx   . Alcohol abuse Neg Hx   . Early death Neg Hx   . Hearing loss Neg Hx   . Hyperlipidemia Neg Hx   . Kidney disease Neg Hx   . Stroke Neg Hx    Social History  Substance Use Topics  . Smoking status: Never Smoker   . Smokeless tobacco: Never Used  . Alcohol Use: No   OB History    No data available     Review of  Systems  All other systems reviewed and are negative.   Allergies  Oxycodone  Home Medications   Prior to Admission medications   Medication Sig Start Date End Date Taking? Authorizing Provider  acetaminophen (TYLENOL) 500 MG tablet Take 1 tablet (500 mg total) by mouth every 8 (eight) hours as needed. For pain 12/13/14   Albertine Grates, MD  methocarbamol (ROBAXIN) 750 MG tablet TAKE 1 TABLET (750 MG TOTAL) BY MOUTH EVERY 8 (EIGHT) HOURS AS NEEDED FOR MUSCLE SPASMS. 03/24/15   [name], MD  metoprolol succinate (TOPROL-XL) 50 MG 24 hr tablet Take 1 tablet (50 mg total) by mouth daily. 01/09/15   [name], MD  pantoprazole (PROTONIX) 40 MG tablet Take 1 tablet (40 mg total) by mouth daily. 12/20/14   [name], MD  traMADol (ULTRAM) 50 MG tablet TAKE 1 TABLET BY MOUTH EVERY 6 HOURS AS NEEDED 07/16/15   Georgina Quint Plotnikov, MD   BP 158/112 mmHg  Pulse 72  Temp(Src) 98.3 F (36.8 C) (Oral)  Resp 16  Ht  (1.651 m)  Wt 228 lb (103.42 kg)  BMI 37.94 kg/m2  SpO2 98%   Physical Exam  General: Well-developed, well-nourished female in no acute distress; appearance consistent with  age of record HENT: normocephalic; atraumatic Eyes: pupils equal, round and reactive to light; extraocular muscles intact Neck: supple Heart: regular rate and rhythm Lungs: clear to auscultation bilaterally Abdomen: soft; nondistended; left lower quadrant and right lower quadrant tenderness; bowel sounds present GU: No CVA tenderness Back: Pain on movement of lower back Extremities: No deformity; full range of motion; pulses normal Neurologic: Awake, alert and oriented; motor function intact in all extremities and symmetric; no facial droop Skin: Warm and dry Psychiatric: Normal mood and affect    ED Course  Procedures (including critical care time)   MDM   Nursing notes and vitals signs, including pulse oximetry, reviewed.  Summary of this visit's results, reviewed by myself:  Labs:   Results for orders placed or performed during the hospital encounter of 08/16/15 (from the past 24 hour(s))  Urinalysis, Routine w reflex microscopic ([facility])     Status: Abnormal   Collection Time: 08/16/15 12:20 AM  Result Value Ref Range   Color, Urine YELLOW YELLOW   APPearance CLEAR CLEAR   Specific Gravity, Urine 1.012 1.005 - 1.030   pH 6.0 5.0 - 8.0   Glucose, UA NEGATIVE NEGATIVE mg/dL   Hgb urine dipstick TRACE (A) NEGATIVE   Bilirubin Urine NEGATIVE NEGATIVE   Ketones, ur NEGATIVE NEGATIVE mg/dL   Protein, ur NEGATIVE NEGATIVE mg/dL   Nitrite NEGATIVE NEGATIVE   Leukocytes, UA SMALL (A) NEGATIVE  Urine microscopic-add on     Status: Abnormal   Collection Time: 08/16/15 12:20 AM  Result Value Ref Range   Squamous Epithelial / LPF 0-5 (A) NONE SEEN   WBC, UA 6-30 0 - 5 WBC/hpf   RBC / HPF 0-5 0 - 5 RBC/hpf   Bacteria, UA FEW (A) NONE SEEN     Paula LibraJohn Feliciana Narayan, MD 08/16/15 [id number]  Paula LibraJohn Finnegan Gatta, MD 08/16/15 [id number]

## 2015-08-16 NOTE — ED Notes (Signed)
MD at bedside. 

## 2015-08-16 NOTE — ED Notes (Signed)
Pt c/o lower bil flank pain which radiates around to abd  X 2 days, nausea onlyy , also c/o urinary freq x 1 week

## 2015-08-16 NOTE — ED Notes (Signed)
Pt verbalizes understanding of d/c instructions and denies any further needs at this time. 

## 2015-08-17 ENCOUNTER — Encounter: Payer: Self-pay | Admitting: Family Medicine

## 2015-08-17 ENCOUNTER — Ambulatory Visit (INDEPENDENT_AMBULATORY_CARE_PROVIDER_SITE_OTHER): Payer: BLUE CROSS/BLUE SHIELD | Admitting: Family Medicine

## 2015-08-17 VITALS — BP 126/88 | HR 77 | Temp 98.9°F | Resp 20 | Ht 64.0 in | Wt 286.8 lb

## 2015-08-17 DIAGNOSIS — N3000 Acute cystitis without hematuria: Secondary | ICD-10-CM | POA: Diagnosis not present

## 2015-08-17 DIAGNOSIS — R1013 Epigastric pain: Secondary | ICD-10-CM

## 2015-08-17 DIAGNOSIS — K2100 Gastro-esophageal reflux disease with esophagitis, without bleeding: Secondary | ICD-10-CM

## 2015-08-17 DIAGNOSIS — N390 Urinary tract infection, site not specified: Secondary | ICD-10-CM | POA: Insufficient documentation

## 2015-08-17 LAB — URINE CULTURE

## 2015-08-17 MED ORDER — NITROFURANTOIN MONOHYD MACRO 100 MG PO CAPS: 100.0000 mg | ORAL_CAPSULE | Freq: Two times a day (BID) | ORAL | Status: DC

## 2015-08-17 NOTE — Progress Notes (Signed)
Pre visit review using our clinic review tool, if applicable. No additional management support is needed unless otherwise documented below in the visit note. 

## 2015-08-17 NOTE — Progress Notes (Signed)
   Subjective:    Patient ID: [name], female    DOB: 10/15/1965, 50 y.o.   MRN: [id number]  HPI   50 year old female patient of Dr. Yetta BarreJones presents following ER visit on 08/16/2015 for back pain radiating to right lower quadrant. Also has 1 week history of incomplete voiding and urinary frequency.  No fever, no chills.   BP was elevated in ER, lower now. BP Readings from Last 3 Encounters:  08/17/15 126/88  08/16/15 158/112  06/20/15 128/80   UA showed small leuk, trace hg, Micro:6-30 wbc  urine culture was contaminated, multiple species. Started on bactrim DS BID x 7 days   Today she reports yesterday when she woke up she had sharp pain in chest centrally, has been constant since.  Worse with with moving and deep breaths.  No further pain in back and low abdomen.  No N/V/D/C.  She has continued bactrim.  she did take ibuprofen yesterday because of back pain.  She has history of GERD. She is on pantoprazole 40 mg daily.       Review of Systems  Constitutional: Negative for fever and fatigue.  HENT: Negative for ear pain.   Eyes: Negative for pain.  Respiratory: Negative for chest tightness and shortness of breath.   Cardiovascular: Negative for chest pain, palpitations and leg swelling.  Gastrointestinal: Negative for abdominal pain and blood in stool.  Genitourinary: Negative for dysuria, urgency and frequency.       Objective:   Physical Exam  Constitutional: Vital signs are normal. She appears well-developed and well-nourished. She is cooperative.  Non-toxic appearance. She does not appear ill. No distress.  Obesity   HENT:  Head: Normocephalic.  Right Ear: Hearing, tympanic membrane, external ear and ear canal normal. Tympanic membrane is not erythematous, not retracted and not bulging.  Left Ear: Hearing, tympanic membrane, external ear and ear canal normal. Tympanic membrane is not erythematous, not retracted and not bulging.  Nose: No mucosal edema  or rhinorrhea. Right sinus exhibits no maxillary sinus tenderness and no frontal sinus tenderness. Left sinus exhibits no maxillary sinus tenderness and no frontal sinus tenderness.  Mouth/Throat: Uvula is midline, oropharynx is clear and moist and mucous membranes are normal.  Eyes: Conjunctivae, EOM and lids are normal. Pupils are equal, round, and reactive to light. Lids are everted and swept, no foreign bodies found.  Neck: Trachea normal and normal range of motion. Neck supple. Carotid bruit is not present. No thyroid mass and no thyromegaly present.  Cardiovascular: Normal rate, regular rhythm, S1 normal, S2 normal, normal heart sounds, intact distal pulses and normal pulses.  Exam reveals no gallop and no friction rub.   No murmur heard. Pulmonary/Chest: Effort normal and breath sounds normal. No tachypnea. No respiratory distress. She has no decreased breath sounds. She has no wheezes. She has no rhonchi. She has no rales.  Abdominal: Soft. Normal appearance and bowel sounds are normal. There is tenderness in the epigastric area. There is no rigidity, no guarding and no CVA tenderness.  Neurological: She is alert.  Skin: Skin is warm, dry and intact. No rash noted.  Psychiatric: Her speech is normal and behavior is normal. Judgment and thought content normal. Her mood appears not anxious. Cognition and memory are normal. She does not exhibit a depressed mood.          Assessment & Plan:

## 2015-08-17 NOTE — Patient Instructions (Signed)
Stop ibuprofen.  Avoid any acid triggers, like tomatos, citris, alcohol, caffeine, soda.  Continue protonix 40 mg daily. Stop bactrim and change nitrofurantion x 7 days.  can also use zantac twice daily as needed for  Reflux.  Drink lots of water.

## 2015-08-17 NOTE — Assessment & Plan Note (Signed)
Likely flare due to bactrim and ibuprofen.  Stop anc change antibiotics, avoid triggers, continue PPI and can add H2 blocker temporarily.

## 2015-08-17 NOTE — Assessment & Plan Note (Signed)
Urine culture nonspecific but pt already improving.  Change to nitrofurantoin  Given SE to bactrim.

## 2015-08-19 ENCOUNTER — Encounter (HOSPITAL_BASED_OUTPATIENT_CLINIC_OR_DEPARTMENT_OTHER): Payer: Self-pay

## 2015-08-19 ENCOUNTER — Emergency Department (HOSPITAL_BASED_OUTPATIENT_CLINIC_OR_DEPARTMENT_OTHER): Payer: BLUE CROSS/BLUE SHIELD

## 2015-08-19 ENCOUNTER — Emergency Department (HOSPITAL_BASED_OUTPATIENT_CLINIC_OR_DEPARTMENT_OTHER)
Admission: EM | Admit: 2015-08-19 | Discharge: 2015-08-19 | Disposition: A | Discharge status: Home / Self Care | Payer: BLUE CROSS/BLUE SHIELD | Attending: Emergency Medicine | Admitting: Emergency Medicine

## 2015-08-19 DIAGNOSIS — R1013 Epigastric pain: Secondary | ICD-10-CM

## 2015-08-19 DIAGNOSIS — I10 Essential (primary) hypertension: Secondary | ICD-10-CM | POA: Insufficient documentation

## 2015-08-19 DIAGNOSIS — M1990 Unspecified osteoarthritis, unspecified site: Secondary | ICD-10-CM | POA: Insufficient documentation

## 2015-08-19 DIAGNOSIS — R079 Chest pain, unspecified: Secondary | ICD-10-CM | POA: Diagnosis not present

## 2015-08-19 DIAGNOSIS — Z79899 Other long term (current) drug therapy: Secondary | ICD-10-CM | POA: Diagnosis not present

## 2015-08-19 DIAGNOSIS — R110 Nausea: Secondary | ICD-10-CM | POA: Insufficient documentation

## 2015-08-19 DIAGNOSIS — Z6841 Body Mass Index (BMI) 40.0 and over, adult: Secondary | ICD-10-CM | POA: Insufficient documentation

## 2015-08-19 LAB — COMPREHENSIVE METABOLIC PANEL
ALT: 9 U/L — ABNORMAL LOW (ref 14–54)
AST: 16 U/L (ref 15–41)
Albumin: 3.8 g/dL (ref 3.5–5.0)
Alkaline Phosphatase: 91 U/L (ref 38–126)
Anion gap: 7 (ref 5–15)
BUN: 12 mg/dL (ref 6–20)
CHLORIDE: 102 mmol/L (ref 101–111)
CO2: 27 mmol/L (ref 22–32)
Calcium: 9.4 mg/dL (ref 8.9–10.3)
Creatinine, Ser: 0.78 mg/dL (ref 0.44–1.00)
Glucose, Bld: 105 mg/dL — ABNORMAL HIGH (ref 65–99)
POTASSIUM: 4.6 mmol/L (ref 3.5–5.1)
Sodium: 136 mmol/L (ref 135–145)
Total Bilirubin: 0.6 mg/dL (ref 0.3–1.2)
Total Protein: 8.1 g/dL (ref 6.5–8.1)

## 2015-08-19 LAB — CBC WITH DIFFERENTIAL/PLATELET
Basophils Absolute: 0.1 10*3/uL (ref 0.0–0.1)
Basophils Relative: 1 %
EOS ABS: 0.3 10*3/uL (ref 0.0–0.7)
EOS PCT: 2 %
HCT: 40.2 % (ref 36.0–46.0)
Hemoglobin: 12.9 g/dL (ref 12.0–15.0)
LYMPHS ABS: 3.9 10*3/uL (ref 0.7–4.0)
LYMPHS PCT: 27 %
MCH: 28.2 pg (ref 26.0–34.0)
MCHC: 32.1 g/dL (ref 30.0–36.0)
MCV: 87.8 fL (ref 78.0–100.0)
MONO ABS: 1.3 10*3/uL — AB (ref 0.1–1.0)
Monocytes Relative: 9 %
Neutro Abs: 9.2 10*3/uL — ABNORMAL HIGH (ref 1.7–7.7)
Neutrophils Relative %: 61 %
PLATELETS: 308 10*3/uL (ref 150–400)
RBC: 4.58 MIL/uL (ref 3.87–5.11)
RDW: 14.1 % (ref 11.5–15.5)
WBC: 14.8 10*3/uL — ABNORMAL HIGH (ref 4.0–10.5)

## 2015-08-19 LAB — LIPASE, BLOOD: LIPASE: 13 U/L (ref 11–51)

## 2015-08-19 LAB — TROPONIN I

## 2015-08-19 MED ORDER — GI COCKTAIL ~~LOC~~
30.0000 mL | Freq: Once | ORAL | Status: AC
  Administered 2015-08-19: 30 mL via ORAL
  Filled 2015-08-19: qty 30

## 2015-08-19 MED ORDER — NITROGLYCERIN 0.4 MG SL SUBL
0.4000 mg | SUBLINGUAL_TABLET | SUBLINGUAL | Status: DC | PRN
  Administered 2015-08-19 (×2): 0.4 mg via SUBLINGUAL
  Filled 2015-08-19: qty 1

## 2015-08-19 MED ORDER — ACETAMINOPHEN 325 MG PO TABS
650.0000 mg | ORAL_TABLET | Freq: Once | ORAL | Status: AC
  Administered 2015-08-19: 650 mg via ORAL
  Filled 2015-08-19: qty 2

## 2015-08-19 NOTE — Discharge Instructions (Signed)

## 2015-08-19 NOTE — ED Provider Notes (Addendum)
CSN: [id number]     Arrival date & time 08/19/15  1822 History  By signing my name below, I, Soijett Blue, attest that this documentation has been prepared under the direction and in the presence of Alvira Monday, MD. Electronically Signed: Soijett Blue, ED Scribe. 08/19/2015. 7:14 PM.   Chief Complaint  Patient presents with  . Abdominal Pain      The history is provided by the patient. No language interpreter was used.    HPI Comments: [name] is a 50 y.o. female with a medical hx of hiatal hernia, GERD who presents to the Emergency Department complaining of constant, aching, upper abdominal pain onset 3 days. Pt states that her upper abdominal pain radiates to her lower chest and there is pain with movement and exertion. Denies any alleviating factors. Pt states that her abdominal pain began following bactrim treatment for a UTI dx in the ED that she began 3 days ago. Pt saw her PCP 2 days ago for continued abdominal pain and had her abx treatment changed due to GERD. She states that she is having associated symptoms of resolved nausea. She states that she has tried advil yesterday, tylenol, and muscle relaxer, with no relief for her symptoms. She denies SOB, vomiting, fever, constipation, cough, back pain, and any other symptoms. Denies PMHx of DM or heart issues. Denies family hx of heart issues. Denies smoking cigarettes.    Past Medical History  Diagnosis Date  . Hypertension   . Sleep apnea        . GERD (gastroesophageal reflux disease)   . Arthritis   . Morbid obesity (HCC)   . Hiatal hernia   . Migraines    Past Surgical History  Procedure Laterality Date  . Abdominal hysterectomy    . Cardiovascular stress test  07/25/2007    Negative stress echo; ef 55-60%  . Knee arthroscopy  01/2011    left  . Esophageal manometry  03/07/2012    Procedure: ESOPHAGEAL MANOMETRY (EM);  Surgeon: Mardella Layman, MD;  Location: [facility];  Service: Endoscopy;   Laterality: N/A;  . Knee surgery    . Abdominal hysterectomy  2003   Family History  Problem Relation Age of Onset  . Cervical cancer Mother   . Hypertension Sister   . Diabetes Sister   . Heart disease Neg Hx   . Cancer Neg Hx   . Alcohol abuse Neg Hx   . Early death Neg Hx   . Hearing loss Neg Hx   . Hyperlipidemia Neg Hx   . Kidney disease Neg Hx   . Stroke Neg Hx    Social History  Substance Use Topics  . Smoking status: Never Smoker   . Smokeless tobacco: Never Used  . Alcohol Use: No   OB History    No data available     Review of Systems  Constitutional: Negative for fever.  HENT: Negative for sore throat.   Eyes: Negative for visual disturbance.  Respiratory: Negative for cough and shortness of breath.   Cardiovascular: Positive for chest pain. Negative for leg swelling.  Gastrointestinal: Positive for nausea and abdominal pain. Negative for vomiting and constipation.  Genitourinary: Negative for difficulty urinating.  Musculoskeletal: Negative for back pain and neck pain.  Skin: Negative for rash.  Neurological: Negative for syncope and headaches.      Allergies  Oxycodone  Home Medications   Prior to Admission medications   Medication Sig Start Date End Date Taking? Authorizing Provider  acetaminophen (TYLENOL) 500 MG tablet Take 1 tablet (500 mg total) by mouth every 8 (eight) hours as needed. For pain 12/13/14   Albertine Grates, MD  methocarbamol (ROBAXIN) 750 MG tablet TAKE 1 TABLET (750 MG TOTAL) BY MOUTH EVERY 8 (EIGHT) HOURS AS NEEDED FOR MUSCLE SPASMS. 03/24/15   [name], MD  metoprolol succinate (TOPROL-XL) 50 MG 24 hr tablet Take 1 tablet (50 mg total) by mouth daily. 01/09/15   [name], MD  nitrofurantoin, macrocrystal-monohydrate, (MACROBID) 100 MG capsule Take 1 capsule (100 mg total) by mouth 2 (two) times daily. 08/17/15   Amy Michelle Nasuti, MD  pantoprazole (PROTONIX) 40 MG tablet Take 1 tablet (40 mg total) by mouth daily. 12/20/14    [name], MD  traMADol (ULTRAM) 50 MG tablet TAKE 1 TABLET BY MOUTH EVERY 6 HOURS AS NEEDED 07/16/15   Georgina Quint Plotnikov, MD   BP 110/74 mmHg  Pulse 72  Temp(Src) 99.4 F (37.4 C) (Oral)  Resp 19  Ht 5\' 5"  (1.651 m)  Wt 286 lb (129.729 kg)  BMI 47.59 kg/m2  SpO2 96% Physical Exam  Constitutional: She is oriented to person, place, and time. She appears well-developed and well-nourished. No distress.  HENT:  Head: Normocephalic and atraumatic.  Eyes: EOM are normal.  Neck: Neck supple.  Cardiovascular: Normal rate, regular rhythm and normal heart sounds.  Exam reveals no gallop and no friction rub.   No murmur heard. Pulmonary/Chest: Effort normal. No respiratory distress. She has no wheezes. She has rales.  Bilateral lower lobe rales  Abdominal: Soft. She exhibits no distension. There is tenderness in the epigastric area.  Mild epigastric tenderness  Musculoskeletal: Normal range of motion.  Neurological: She is alert and oriented to person, place, and time.  Skin: Skin is warm and dry.  Psychiatric: She has a normal mood and affect. Her behavior is normal.  Nursing note and vitals reviewed.   ED Course  Procedures (including critical care time) DIAGNOSTIC STUDIES: Oxygen Saturation is 93% on RA, low by my interpretation.    COORDINATION OF CARE: 7:14 PM Discussed treatment plan with pt at bedside which includes labs, EKG, CXR, NTG, tylenol, and pt agreed to plan.    Labs Review Labs Reviewed  CBC WITH DIFFERENTIAL/PLATELET - Abnormal; Notable for the following:    WBC 14.8 (*)    Neutro Abs 9.2 (*)    Monocytes Absolute 1.3 (*)    All other components within normal limits  COMPREHENSIVE METABOLIC PANEL - Abnormal; Notable for the following:    Glucose, Bld 105 (*)    ALT 9 (*)    All other components within normal limits  LIPASE, BLOOD  TROPONIN I  TROPONIN I    Imaging Review Dg Chest 2 View  08/19/2015  CLINICAL DATA:  Pain underneath the left breast  with nausea. EXAM: CHEST  2 VIEW COMPARISON:  05/15/2015 FINDINGS: Linear density in left lower chest appears stable and could represent mild scarring. Otherwise, the lungs are clear. Heart size is within normal limits and stable. The trachea is midline. No large pleural effusions. Mild degenerative endplate changes in the thoracic spine. IMPRESSION: No active cardiopulmonary disease. Electronically Signed   By: Richarda Overlie M.D.   On: 08/19/2015 19:56   I have personally reviewed and evaluated these images and lab results as part of my medical decision-making.   EKG Interpretation   Date/Time:  Monday August 19 2015 18:39:50 EDT Ventricular Rate:  79 PR Interval:  144  QRS Duration: 74 QT Interval:  358 QTC Calculation: 410 R Axis:   50 Text Interpretation:  Sinus rhythm No significant change since last  tracing Confirmed by [facility], Vrinda Heckstall ([id number]) on 08/19/2015 10:55:14 PM  Also confirmed by [facility], Lydie Stammen ([id number]), editor [place]  CCT,  BEVERLY ([id number])  on 08/20/2015 6:53:56 AM      MDM   Final diagnoses:  Abdominal pain, epigastric   50 year old female with history of hypertension, GERD presents to concern for epigastric abdominal pain radiating to the chest after beginning bactrim for UTI days ago. Differential diagnosis for epigastric and chest pain includes reflux, pancreatitis, pulmonary embolus, dissection, pneumothorax, pneumonia, ACS, myocarditis, pericarditis.  EKG was done and evaluate by me and showed no acute ST changes and no signs of pericarditis. Chest x-ray was done and evaluated by me and radiology and showed  no sign of pneumonia or pneumothorax. Doubt PE given no dyspnea, no hypoxia, no tachypnea.  Lipase and CMP within normal limits. Exam not consistent with cholecystitis. Patient was given nitroglycerin without relief of pain, however discomfort was relieved by GI cocktail. She had 2 negative troponins. Overall by history, have low suspicion for anginal  equivalent, however recommend close PCP follow-up.  Patient discharged in stable condition with understanding of reasons to return.   I personally performed the services described in this documentation, which was scribed in my presence. The recorded information has been reviewed and is accurate.     Alvira MondayErin Davaughn Hillyard, MD 08/21/15 [phone]

## 2015-08-19 NOTE — ED Notes (Signed)
Pt placed on auto vitals Q30. Patient placed on cardiac monitor.  

## 2015-08-19 NOTE — ED Notes (Addendum)
C/o epigastric pain after starting abx for UTI on 6/9-was seen by PCP 6/10 for pain-was told r/t to abx and GERD-abx was changed-pain continued-NAD-steady gait

## 2015-08-30 ENCOUNTER — Other Ambulatory Visit: Payer: Self-pay | Admitting: Internal Medicine

## 2015-09-02 NOTE — Telephone Encounter (Signed)
Faxed script back to [facility].../lmb 

## 2015-09-10 ENCOUNTER — Other Ambulatory Visit: Payer: Self-pay | Admitting: Internal Medicine

## 2015-10-01 ENCOUNTER — Telehealth: Payer: Self-pay | Admitting: Internal Medicine

## 2015-10-01 NOTE — Telephone Encounter (Signed)
Patient Name: [name]  DOB: [birth year]    Initial Comment Caller having some stomach pain since Friday , was admitted in Oct for it and it started back up again    Nurse Assessment      Guidelines    Guideline Title Affirmed Question Affirmed Notes       Final Disposition User   FINAL ATTEMPT MADE - no message left [place], RN, [employer]

## 2015-10-08 ENCOUNTER — Encounter: Payer: Self-pay | Admitting: Adult Health

## 2015-10-08 ENCOUNTER — Ambulatory Visit (INDEPENDENT_AMBULATORY_CARE_PROVIDER_SITE_OTHER): Payer: BLUE CROSS/BLUE SHIELD | Admitting: Adult Health

## 2015-10-08 ENCOUNTER — Other Ambulatory Visit: Payer: Self-pay | Admitting: Internal Medicine

## 2015-10-08 VITALS — BP 102/70 | Temp 98.6°F | Wt 288.0 lb

## 2015-10-08 DIAGNOSIS — R070 Pain in throat: Secondary | ICD-10-CM | POA: Diagnosis not present

## 2015-10-08 NOTE — Progress Notes (Signed)
   Subjective:    Patient ID: [name], female    DOB: 07-16-1965, 50 y.o.   MRN: [id number]  Sore Throat   This is a new problem. The current episode started in the past 7 days. The problem has been gradually worsening. Neither side of throat is experiencing more pain than the other. There has been no fever. The pain is mild. Associated symptoms include coughing and a hoarse voice. Pertinent negatives include no ear discharge, ear pain, neck pain, shortness of breath, swollen glands or trouble swallowing. She has had no exposure to strep or mono. She has tried acetaminophen and NSAIDs for the symptoms. The treatment provided mild relief.   She reports that she doesn't have pain, it just feels like her tonsils are enlarged and she has a tickle in her throat.    Review of Systems  HENT: Positive for hoarse voice and voice change. Negative for ear discharge, ear pain, mouth sores, postnasal drip, rhinorrhea, sinus pressure, sore throat and trouble swallowing.   Eyes: Negative.   Respiratory: Positive for cough. Negative for shortness of breath and wheezing.   Musculoskeletal: Negative for neck pain.  All other systems reviewed and are negative.      Objective:   Physical Exam  Constitutional: She is oriented to person, place, and time. She appears well-developed and well-nourished. No distress.  HENT:  Right Ear: Hearing, tympanic membrane, external ear and ear canal normal.  Left Ear: Hearing, tympanic membrane, external ear and ear canal normal.  Nose: Nose normal. No mucosal edema or rhinorrhea. Right sinus exhibits no maxillary sinus tenderness and no frontal sinus tenderness. Left sinus exhibits no frontal sinus tenderness.  Mouth/Throat: Uvula is midline, oropharynx is clear and moist and mucous membranes are normal. No oropharyngeal exudate, posterior oropharyngeal edema, posterior oropharyngeal erythema or tonsillar abscesses.  Cardiovascular: Normal rate, regular rhythm,  normal heart sounds and intact distal pulses.  Exam reveals no gallop.   No murmur heard. Pulmonary/Chest: Effort normal and breath sounds normal. No respiratory distress. She has no wheezes. She has no rales. She exhibits no tenderness.  Neurological: She is alert and oriented to person, place, and time.  Skin: Skin is warm and dry. No rash noted. She is not diaphoretic. No erythema. No pallor.  Psychiatric: She has a normal mood and affect. Her behavior is normal. Judgment and thought content normal.  Nursing note and vitals reviewed.     Assessment & Plan:  1. Throat discomfort - No acute processes found on why she is having her symptoms - No bacterial infection noted - Possibly seasonal allergy related - Advised warm salt water gargles and OTC allergy pill.  - Follow up if no improvement  Shirline Frees, NP

## 2015-11-25 ENCOUNTER — Other Ambulatory Visit: Payer: Self-pay | Admitting: Internal Medicine

## 2015-12-10 ENCOUNTER — Ambulatory Visit: Payer: BLUE CROSS/BLUE SHIELD | Admitting: Internal Medicine

## 2015-12-26 ENCOUNTER — Other Ambulatory Visit: Payer: Self-pay | Admitting: Internal Medicine

## 2015-12-26 DIAGNOSIS — I10 Essential (primary) hypertension: Secondary | ICD-10-CM

## 2016-02-10 LAB — HM MAMMOGRAPHY: HM MAMMO: NORMAL (ref 0–4)

## 2016-02-11 ENCOUNTER — Other Ambulatory Visit: Payer: Self-pay | Admitting: Internal Medicine

## 2016-02-11 DIAGNOSIS — K2100 Gastro-esophageal reflux disease with esophagitis, without bleeding: Secondary | ICD-10-CM

## 2016-02-12 ENCOUNTER — Encounter: Payer: Self-pay | Admitting: Gastroenterology

## 2016-02-13 ENCOUNTER — Ambulatory Visit (INDEPENDENT_AMBULATORY_CARE_PROVIDER_SITE_OTHER): Payer: BLUE CROSS/BLUE SHIELD | Admitting: Internal Medicine

## 2016-02-13 ENCOUNTER — Encounter: Payer: Self-pay | Admitting: Internal Medicine

## 2016-02-13 VITALS — BP 122/84 | HR 82 | Ht 65.0 in | Wt 293.0 lb

## 2016-02-13 DIAGNOSIS — R1310 Dysphagia, unspecified: Secondary | ICD-10-CM

## 2016-02-13 DIAGNOSIS — K222 Esophageal obstruction: Secondary | ICD-10-CM | POA: Diagnosis not present

## 2016-02-13 DIAGNOSIS — K449 Diaphragmatic hernia without obstruction or gangrene: Secondary | ICD-10-CM | POA: Diagnosis not present

## 2016-02-13 DIAGNOSIS — R1319 Other dysphagia: Secondary | ICD-10-CM

## 2016-02-13 DIAGNOSIS — R1013 Epigastric pain: Secondary | ICD-10-CM

## 2016-02-13 NOTE — Patient Instructions (Signed)
  You have been scheduled for an endoscopy. Please follow written instructions given to you at your visit today. If you use inhalers (even only as needed), please bring them with you on the day of your procedure.   I appreciate the opportunity to care for you. Carl Gessner, MD, FACG 

## 2016-02-13 NOTE — Progress Notes (Signed)
[name] 50 y.o. 03/18/1965 [id number]  Assessment & Plan:   Encounter Diagnoses  Name Primary?  . Abdominal pain, epigastric Yes  . Esophageal dysphagia   . Hiatal hernia   . Esophageal stenosis on barium swallow      EGD to evaluate sxs and signs  The risks and benefits as well as alternatives of endoscopic procedure(s) have been discussed and reviewed. All questions answered. The patient agrees to proceed.  ? Repeat manometry next - she has esophageal dysmotility and a stenotic distal esophagus on 2015 Ba swallow that let 13 mm tablet pass - ? If hiatal hernia effect - prior ba swllow showed esophageal diverticulum - liquid> solid dysphagia suggests motility problem as opposed to stricture.  She is on a PPI and taking so GERD less likely  I appreciate the opportunity to care for this patient. QI:HKVQQVCc:Thomas Yetta BarreJones, MD     Subjective:   Chief Complaint: epigastric pain  HPI Nice woman w/ hx GERD  She's had an extensive workup in 2013-14 with EGD, esophageal biopsy showed reflux, gastric emptying study which was normal, and an esophageal manometry which is listed as being normal in the body of previous office notes but had incomplete bolus clearance with high lower esophageal sphincter pressure and a nonspecific esophageal motility disorder. 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 Repeat Ba swallow 2015 distal esophageal stenosis, 13 mm tablet passed, no dysmotility, hiatal hernia. I sent My Chart message - she did not f/u.  Now having 2 mos of pc epigastric pain, ache, pressure, not sharp. Lastts 1-2 hrs pc. Is taking PPI Chart review shows she went to ED for similar sxs 5 mos ago - labs neg, troponin neg EKG neg Liquids and egss cause dysphagia - sticking sensation in suprasternal area  Vomited only 1x after apple cider vinegar as Tx  Wt Readings from Last 3 Encounters:  02/13/16 293 lb (132.9 kg)  10/08/15 288 lb (130.6 kg)  08/19/15  286 lb (129.7 kg)   Allergies  Allergen Reactions  . Oxycodone Shortness Of Breath   Outpatient Medications Prior to Visit  Medication Sig Dispense Refill  . acetaminophen (TYLENOL) 500 MG tablet Take 1 tablet (500 mg total) by mouth every 8 (eight) hours as needed. For pain 30 tablet 0  . methocarbamol (ROBAXIN) 750 MG tablet TAKE 1 TABLET (750 MG TOTAL) BY MOUTH EVERY 8 (EIGHT) HOURS AS NEEDED FOR MUSCLE SPASMS. 90 tablet 0  . metoprolol succinate (TOPROL-XL) 50 MG 24 hr tablet TAKE 1 TABLET (50 MG TOTAL) BY MOUTH DAILY. 90 tablet 0  . pantoprazole (PROTONIX) 40 MG tablet TAKE 1 TABLET(40 MG) BY MOUTH DAILY 90 tablet 2  . traMADol (ULTRAM) 50 MG tablet TAKE 1 TABLET BY MOUTH EVERY 6 HOURS AS NEEDED 65 tablet 2   Past Medical History:  Diagnosis Date  . Arthritis   . GERD (gastroesophageal reflux disease)   . Hiatal hernia   . Hypertension   . Migraines   . Morbid obesity (HCC)   . Sleep apnea        Past Surgical History:  Procedure Laterality Date  . ABDOMINAL HYSTERECTOMY    . ABDOMINAL HYSTERECTOMY  2003  . CARDIOVASCULAR STRESS TEST  07/25/2007   Negative stress echo; ef 55-60%  . ESOPHAGEAL MANOMETRY  03/07/2012   Procedure: ESOPHAGEAL MANOMETRY (EM);  Surgeon: Mardella Laymanavid R Patterson, MD;  Location: [facility];  Service: Endoscopy;  Laterality: N/A;  . KNEE ARTHROSCOPY  01/2011   left  .  KNEE SURGERY     Social History   Social History  . Marital status: Married    Spouse name: N/A  . Number of children: 0  . Years of education: N/A   Occupational History  . 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 Smoking status: Never Smoker  . Smokeless tobacco: Never Used  . Alcohol use No  . Drug use: No  . Sexual activity: Not Asked   Other Topics Concern  . None   Social History Narrative  . None   Family History  Problem Relation Age of Onset  . Cervical cancer Mother   . Hypertension Sister   . Diabetes Sister   . Heart disease Neg Hx   .  Cancer Neg Hx   . Alcohol abuse Neg Hx   . Early death Neg Hx   . Hearing loss Neg Hx   . Hyperlipidemia Neg Hx   . Kidney disease Neg Hx   . Stroke Neg Hx        Review of Systems As above  Objective:   Physical Exam @BP  122/84   Pulse 82   Ht 5\' 5"  (1.651 m)   Wt 293 lb (132.9 kg)   BMI 48.76 kg/m @  General:  NAD - obese Eyes:   anicteric Lungs:  clear Heart::  S1S2 no rubs, murmurs or gallops Abdomen:  soft and mildly tender epigastrium, BS+ Psych:  NL mood and affect     Data Reviewed:  See HPI/ Ass/Plan

## 2016-02-19 ENCOUNTER — Ambulatory Visit (AMBULATORY_SURGERY_CENTER): Payer: BLUE CROSS/BLUE SHIELD | Admitting: Internal Medicine

## 2016-02-19 ENCOUNTER — Encounter: Payer: Self-pay | Admitting: Internal Medicine

## 2016-02-19 VITALS — BP 128/75 | HR 68 | Temp 97.5°F | Resp 16 | Ht 65.0 in | Wt 293.0 lb

## 2016-02-19 DIAGNOSIS — R1319 Other dysphagia: Secondary | ICD-10-CM

## 2016-02-19 DIAGNOSIS — R1013 Epigastric pain: Secondary | ICD-10-CM

## 2016-02-19 DIAGNOSIS — R1310 Dysphagia, unspecified: Secondary | ICD-10-CM

## 2016-02-19 DIAGNOSIS — K224 Dyskinesia of esophagus: Secondary | ICD-10-CM

## 2016-02-19 DIAGNOSIS — K225 Diverticulum of esophagus, acquired: Secondary | ICD-10-CM

## 2016-02-19 MED ORDER — SODIUM CHLORIDE 0.9 % IV SOLN
500.0000 mL | INTRAVENOUS | Status: DC
Start: 2016-02-19 — End: 2016-06-22

## 2016-02-19 MED ORDER — DICYCLOMINE HCL 20 MG PO TABS: 20.0000 mg | ORAL_TABLET | Freq: Three times a day (TID) | ORAL | 3 refills | Status: DC

## 2016-02-19 NOTE — Patient Instructions (Addendum)
I think the esophagus does not squeeze right. Called esophageal dysmotility.  I am prescribing a medication to see if it helps swallowing and pain - dicyclomine - to take before meals.  Please try that and call soon to make a follow-up appointment for February.  I appreciate the opportunity to care for you. Gina Booparl E. Rupert Azzara, MD, [facility]  Dilation diet handout given to you today.   YOU HAD AN ENDOSCOPIC PROCEDURE TODAY AT THE [facility]:   Refer to the procedure report that was given to you for any specific questions about what was found during the examination.  If the procedure report does not answer your questions, please call your gastroenterologist to clarify.  If you requested that your care partner not be given the details of your procedure findings, then the procedure report has been included in a sealed envelope for you to review at your convenience later.  YOU SHOULD EXPECT: Some feelings of bloating in the abdomen. Passage of more gas than usual.  Walking can help get rid of the air that was put into your GI tract during the procedure and reduce the bloating. If you had a lower endoscopy (such as a colonoscopy or flexible sigmoidoscopy) you may notice spotting of blood in your stool or on the toilet paper. If you underwent a bowel prep for your procedure, you may not have a normal bowel movement for a few days.  Please Note:  You might notice some irritation and congestion in your nose or some drainage.  This is from the oxygen used during your procedure.  There is no need for concern and it should clear up in a day or so.  SYMPTOMS TO REPORT IMMEDIATELY:   Following upper endoscopy (EGD)  Vomiting of blood or coffee ground material  New chest pain or pain under the shoulder blades  Painful or persistently difficult swallowing  New shortness of breath  Fever of 100F or higher  Black, tarry-looking stools  For urgent or emergent issues, a gastroenterologist can  be reached at any hour by calling [phone].   DIET:  Nothing by mouth until 6 pm then clear liquids for 1 hour, then soft diet rest of today. Resume your regular diet tomorrow. Drink plenty of fluids but you should avoid alcoholic beverages for 24 hours.  ACTIVITY:  You should plan to take it easy for the rest of today and you should NOT DRIVE or use heavy machinery until tomorrow (because of the sedation medicines used during the test).    FOLLOW UP: Our staff will call the number listed on your records the next business day following your procedure to check on you and address any questions or concerns that you may have regarding the information given to you following your procedure. If we do not reach you, we will leave a message.  However, if you are feeling well and you are not experiencing any problems, there is no need to return our call.  We will assume that you have returned to your regular daily activities without incident.  If any biopsies were taken you will be contacted by phone or by letter within the next 1-3 weeks.  Please call us at [phone] if you have not heard about the biopsies in 3 weeks.    SIGNATURES/CONFIDENTIALITY: You and/or your care partner have signed paperwork which will be entered into your electronic medical record.  These signatures attest to the fact that that the information above on your After  Visit Summary has been reviewed and is understood.  Full responsibility of the confidentiality of this discharge information lies with you and/or your care-partner.

## 2016-02-19 NOTE — Op Note (Signed)
[facility] Patient Name: [name] Procedure Date: 02/19/2016 4:41 PM MRN: [id number] Endoscopist: Iva Booparl E Gessner , MD Age: 50 Referring MD:  Date of Birth: 04/08/1965 Gender: Female Account #: [id number] Procedure:                Upper GI endoscopy Indications:              Epigastric abdominal pain, Dysphagia Medicines:                Propofol per Anesthesia, Monitored Anesthesia Care Procedure:                Pre-Anesthesia Assessment:                           - Prior to the procedure, a History and Physical                            was performed, and patient medications and                            allergies were reviewed. The patient's tolerance of                            previous anesthesia was also reviewed. The risks                            and benefits of the procedure and the sedation                            options and risks were discussed with the patient.                            All questions were answered, and informed consent                            was obtained. Prior Anticoagulants: The patient has                            taken no previous anticoagulant or antiplatelet                            agents. ASA Grade Assessment: II - A patient with                            mild systemic disease. After reviewing the risks                            and benefits, the patient was deemed in                            satisfactory condition to undergo the procedure.                           After obtaining informed consent, the endoscope was  passed under direct vision. Throughout the                            procedure, the patient's blood pressure, pulse, and                            oxygen saturations were monitored continuously. The                            Model GIF-HQ190 [id number]) scope was introduced                            through the mouth, and advanced to the second part              of duodenum. The upper GI endoscopy was                            accomplished without difficulty. The patient                            tolerated the procedure well. Scope In: Scope Out: Findings:                 A non-bleeding diverticulum with a large opening                            and no stigmata of recent bleeding was found in the                            lower third of the esophagus.                           The examined esophagus was moderately tortuous.                           The exam was otherwise without abnormality.                           The cardia and gastric fundus were normal on                            retroflexion.                           A TTS dilator was passed through the scope.                            Dilation with an 18-19-20 mm balloon dilator was                            performed to 20 mm at the gastroesophageal                            junction. Estimated blood loss: none. Complications:            No immediate complications. Estimated Blood  Loss:     Estimated blood loss: none. Impression:               - Diverticulum in the lower third of the esophagus.                           - Tortuous esophagus.                           - The examination was otherwise normal.                           - No specimens collected.                           - I think motility disturbance is causing sxs Recommendation:           - Patient has a contact number available for                            emergencies. The signs and symptoms of potential                            delayed complications were discussed with the                            patient. Return to normal activities tomorrow.                            Written discharge instructions were provided to the                            patient.                           - Resume previous diet.                           - Continue present medications.                           -  Use Bentyl (dicyclomine) 20 mg PO TID 30 min AC.                           - Return to my office in 3 months.                           - Could need repeat manometry but not sure would                            change treatment at this point Iva Booparl E Gessner, MD 02/19/2016 5:10:50 PM This report has been signed electronically.

## 2016-02-19 NOTE — Progress Notes (Signed)
Called to room to assist during endoscopic procedure.  Patient ID and intended procedure confirmed with present staff. Received instructions for my participation in the procedure from the performing physician.  

## 2016-02-19 NOTE — Progress Notes (Signed)
Report to PACU, RN, vss, BBS= Clear.  

## 2016-02-20 ENCOUNTER — Telehealth: Payer: Self-pay | Admitting: *Deleted

## 2016-02-20 NOTE — Telephone Encounter (Signed)
  Follow up Call-  Call back number 02/19/2016  Post procedure Call Back phone  # [phone]  Permission to leave phone message Yes  Some recent data might be hidden     Patient questions:  Do you have a fever, pain , or abdominal swelling? No. Pain Score  0 *  Have you tolerated food without any problems? Yes.    Have you been able to return to your normal activities? Yes.    Do you have any questions about your discharge instructions: Diet   No. Medications  No. Follow up visit  No.  Do you have questions or concerns about your Care? No.  Actions: * If pain score is 4 or above: No action needed, pain <4.

## 2016-03-18 LAB — HM PAP SMEAR

## 2016-03-24 ENCOUNTER — Telehealth: Payer: Self-pay | Admitting: Internal Medicine

## 2016-03-24 ENCOUNTER — Other Ambulatory Visit: Payer: Self-pay | Admitting: Internal Medicine

## 2016-03-24 DIAGNOSIS — Z20828 Contact with and (suspected) exposure to other viral communicable diseases: Secondary | ICD-10-CM

## 2016-03-24 MED ORDER — OSELTAMIVIR PHOSPHATE 75 MG PO CAPS: 75.0000 mg | ORAL_CAPSULE | Freq: Every day | ORAL | 0 refills | Status: AC

## 2016-03-24 NOTE — Telephone Encounter (Signed)
RX sent

## 2016-03-24 NOTE — Telephone Encounter (Signed)
Patient states spouse for diagnosis with flu on Sunday and his MD suggested that patient get script for Tamaflu from her MD.  Patient states she feels fine other than a scratchy throat.  [facility] [address]

## 2016-03-27 ENCOUNTER — Other Ambulatory Visit: Payer: Self-pay | Admitting: Internal Medicine

## 2016-03-27 DIAGNOSIS — I10 Essential (primary) hypertension: Secondary | ICD-10-CM

## 2016-03-27 NOTE — Telephone Encounter (Signed)
Pt informed and has picked up rx from the pharmacy.

## 2016-04-13 ENCOUNTER — Ambulatory Visit: Payer: BLUE CROSS/BLUE SHIELD | Admitting: Internal Medicine

## 2016-04-16 ENCOUNTER — Other Ambulatory Visit: Payer: Self-pay | Admitting: Internal Medicine

## 2016-04-16 NOTE — Telephone Encounter (Signed)
rx faxed to [facility] 

## 2016-04-29 ENCOUNTER — Other Ambulatory Visit: Payer: Self-pay | Admitting: Internal Medicine

## 2016-05-28 ENCOUNTER — Ambulatory Visit: Payer: BLUE CROSS/BLUE SHIELD | Admitting: Internal Medicine

## 2016-06-22 ENCOUNTER — Other Ambulatory Visit (INDEPENDENT_AMBULATORY_CARE_PROVIDER_SITE_OTHER): Payer: BLUE CROSS/BLUE SHIELD

## 2016-06-22 ENCOUNTER — Encounter: Payer: Self-pay | Admitting: Internal Medicine

## 2016-06-22 ENCOUNTER — Ambulatory Visit (INDEPENDENT_AMBULATORY_CARE_PROVIDER_SITE_OTHER): Payer: BLUE CROSS/BLUE SHIELD | Admitting: Internal Medicine

## 2016-06-22 VITALS — BP 134/84 | HR 61 | Temp 98.4°F | Resp 16 | Ht 65.0 in | Wt 297.0 lb

## 2016-06-22 DIAGNOSIS — Z6841 Body Mass Index (BMI) 40.0 and over, adult: Secondary | ICD-10-CM

## 2016-06-22 DIAGNOSIS — M545 Low back pain: Secondary | ICD-10-CM | POA: Diagnosis not present

## 2016-06-22 DIAGNOSIS — M79605 Pain in left leg: Secondary | ICD-10-CM

## 2016-06-22 DIAGNOSIS — M79604 Pain in right leg: Secondary | ICD-10-CM

## 2016-06-22 DIAGNOSIS — D72829 Elevated white blood cell count, unspecified: Secondary | ICD-10-CM

## 2016-06-22 DIAGNOSIS — R7309 Other abnormal glucose: Secondary | ICD-10-CM

## 2016-06-22 DIAGNOSIS — M67442 Ganglion, left hand: Secondary | ICD-10-CM | POA: Diagnosis not present

## 2016-06-22 DIAGNOSIS — I10 Essential (primary) hypertension: Secondary | ICD-10-CM | POA: Diagnosis not present

## 2016-06-22 DIAGNOSIS — Z0001 Encounter for general adult medical examination with abnormal findings: Secondary | ICD-10-CM | POA: Diagnosis not present

## 2016-06-22 DIAGNOSIS — Z0000 Encounter for general adult medical examination without abnormal findings: Secondary | ICD-10-CM

## 2016-06-22 DIAGNOSIS — Z23 Encounter for immunization: Secondary | ICD-10-CM | POA: Diagnosis not present

## 2016-06-22 LAB — LIPID PANEL
CHOL/HDL RATIO: 4
Cholesterol: 214 mg/dL — ABNORMAL HIGH (ref 0–200)
HDL: 49 mg/dL (ref >39.00)
LDL CALC: 141 mg/dL — AB (ref 0–99)
NONHDL: 164.74
TRIGLYCERIDES: 119 mg/dL (ref 0.0–149.0)
VLDL: 23.8 mg/dL (ref 0.0–40.0)

## 2016-06-22 LAB — CBC WITH DIFFERENTIAL/PLATELET
BASOS PCT: 0.9 % (ref 0.0–3.0)
Basophils Absolute: 0.1 10*3/uL (ref 0.0–0.1)
EOS ABS: 0.3 10*3/uL (ref 0.0–0.7)
Eosinophils Relative: 3.6 % (ref 0.0–5.0)
HEMATOCRIT: 41 % (ref 36.0–46.0)
HEMOGLOBIN: 13.5 g/dL (ref 12.0–15.0)
LYMPHS PCT: 37.9 % (ref 12.0–46.0)
Lymphs Abs: 3.6 10*3/uL (ref 0.7–4.0)
MCHC: 33 g/dL (ref 30.0–36.0)
MCV: 83.9 fl (ref 78.0–100.0)
MONO ABS: 0.8 10*3/uL (ref 0.1–1.0)
Monocytes Relative: 7.9 % (ref 3.0–12.0)
Neutro Abs: 4.8 10*3/uL (ref 1.4–7.7)
Neutrophils Relative %: 49.7 % (ref 43.0–77.0)
Platelets: 303 10*3/uL (ref 150.0–400.0)
RBC: 4.88 Mil/uL (ref 3.87–5.11)
RDW: 13.9 % (ref 11.5–15.5)
WBC: 9.6 10*3/uL (ref 4.0–10.5)

## 2016-06-22 LAB — COMPREHENSIVE METABOLIC PANEL
ALBUMIN: 3.9 g/dL (ref 3.5–5.2)
ALK PHOS: 76 U/L (ref 39–117)
ALT: 8 U/L (ref 0–35)
AST: 15 U/L (ref 0–37)
BUN: 9 mg/dL (ref 6–23)
CALCIUM: 9.3 mg/dL (ref 8.4–10.5)
CHLORIDE: 104 meq/L (ref 96–112)
CO2: 29 mEq/L (ref 19–32)
CREATININE: 0.65 mg/dL (ref 0.40–1.20)
GFR: 123.13 mL/min (ref >60.00)
Glucose, Bld: 86 mg/dL (ref 70–99)
POTASSIUM: 4.2 meq/L (ref 3.5–5.1)
SODIUM: 141 meq/L (ref 135–145)
TOTAL PROTEIN: 7.8 g/dL (ref 6.0–8.3)
Total Bilirubin: 0.5 mg/dL (ref 0.2–1.2)

## 2016-06-22 LAB — HEMOGLOBIN A1C: HEMOGLOBIN A1C: 6 % (ref 4.6–6.5)

## 2016-06-22 LAB — TSH: TSH: 1.8 u[IU]/mL (ref 0.35–4.50)

## 2016-06-22 MED ORDER — METHOCARBAMOL 750 MG PO TABS: ORAL_TABLET | ORAL | 3 refills | Status: DC

## 2016-06-22 MED ORDER — METOPROLOL SUCCINATE ER 50 MG PO TB24: 50.0000 mg | ORAL_TABLET | Freq: Every day | ORAL | 3 refills | Status: DC

## 2016-06-22 MED ORDER — LORCASERIN HCL ER 20 MG PO TB24: 1.0000 | ORAL_TABLET | Freq: Every day | ORAL | 5 refills | Status: DC

## 2016-06-22 NOTE — Patient Instructions (Signed)
Health Maintenance, Female Adopting a healthy lifestyle and getting preventive care can go a long way to promote health and wellness. Talk with your health care provider about what schedule of regular examinations is right for you. This is a good chance for you to check in with your provider about disease prevention and staying healthy. In between checkups, there are plenty of things you can do on your own. Experts have done a lot of research about which lifestyle changes and preventive measures are most likely to keep you healthy. Ask your health care provider for more information. Weight and diet Eat a healthy diet  Be sure to include plenty of vegetables, fruits, low-fat dairy products, and lean protein.  Do not eat a lot of foods high in solid fats, added sugars, or salt.  Get regular exercise. This is one of the most important things you can do for your health.  Most adults should exercise for at least 150 minutes each week. The exercise should increase your heart rate and make you sweat (moderate-intensity exercise).  Most adults should also do strengthening exercises at least twice a week. This is in addition to the moderate-intensity exercise. Maintain a healthy weight  Body mass index (BMI) is a measurement that can be used to identify possible weight problems. It estimates body fat based on height and weight. Your health care provider can help determine your BMI and help you achieve or maintain a healthy weight.  For females 76 years of age and older:  A BMI below 18.5 is considered underweight.  A BMI of 18.5 to 24.9 is normal.  A BMI of 25 to 29.9 is considered overweight.  A BMI of 30 and above is considered obese. Watch levels of cholesterol and blood lipids  You should start having your blood tested for lipids and cholesterol at 52 years of age, then have this test every 5 years.  You may need to have your cholesterol levels checked more often if:  Your lipid or  cholesterol levels are high.  You are older than 52 years of age.  You are at high risk for heart disease. Cancer screening Lung Cancer  Lung cancer screening is recommended for adults 64-42 years old who are at high risk for lung cancer because of a history of smoking.  A yearly low-dose CT scan of the lungs is recommended for people who:  Currently smoke.  Have quit within the past 15 years.  Have at least a 30-pack-year history of smoking. A pack year is smoking an average of one pack of cigarettes a day for 1 year.  Yearly screening should continue until it has been 15 years since you quit.  Yearly screening should stop if you develop a health problem that would prevent you from having lung cancer treatment. Breast Cancer  Practice breast self-awareness. This means understanding how your breasts normally appear and feel.  It also means doing regular breast self-exams. Let your health care provider know about any changes, no matter how small.  If you are in your 20s or 30s, you should have a clinical breast exam (CBE) by a health care provider every 1-3 years as part of a regular health exam.  If you are 34 or older, have a CBE every year. Also consider having a breast X-ray (mammogram) every year.  If you have a family history of breast cancer, talk to your health care provider about genetic screening.  If you are at high risk for breast cancer, talk  to your health care provider about having an MRI and a mammogram every year.  Breast cancer gene (BRCA) assessment is recommended for women who have family members with BRCA-related cancers. BRCA-related cancers include:  Breast.  Ovarian.  Tubal.  Peritoneal cancers.  Results of the assessment will determine the need for genetic counseling and BRCA1 and BRCA2 testing. Cervical Cancer  Your health care provider may recommend that you be screened regularly for cancer of the pelvic organs (ovaries, uterus, and vagina).  This screening involves a pelvic examination, including checking for microscopic changes to the surface of your cervix (Pap test). You may be encouraged to have this screening done every 3 years, beginning at age 24.  For women ages 66-65, health care providers may recommend pelvic exams and Pap testing every 3 years, or they may recommend the Pap and pelvic exam, combined with testing for human papilloma virus (HPV), every 5 years. Some types of HPV increase your risk of cervical cancer. Testing for HPV may also be done on women of any age with unclear Pap test results.  Other health care providers may not recommend any screening for nonpregnant women who are considered low risk for pelvic cancer and who do not have symptoms. Ask your health care provider if a screening pelvic exam is right for you.  If you have had past treatment for cervical cancer or a condition that could lead to cancer, you need Pap tests and screening for cancer for at least 20 years after your treatment. If Pap tests have been discontinued, your risk factors (such as having a new sexual partner) need to be reassessed to determine if screening should resume. Some women have medical problems that increase the chance of getting cervical cancer. In these cases, your health care provider may recommend more frequent screening and Pap tests. Colorectal Cancer  This type of cancer can be detected and often prevented.  Routine colorectal cancer screening usually begins at 52 years of age and continues through 52 years of age.  Your health care provider may recommend screening at an earlier age if you have risk factors for colon cancer.  Your health care provider may also recommend using home test kits to check for hidden blood in the stool.  A small camera at the end of a tube can be used to examine your colon directly (sigmoidoscopy or colonoscopy). This is done to check for the earliest forms of colorectal cancer.  Routine  screening usually begins at age 41.  Direct examination of the colon should be repeated every 5-10 years through 52 years of age. However, you may need to be screened more often if early forms of precancerous polyps or small growths are found. Skin Cancer  Check your skin from head to toe regularly.  Tell your health care provider about any new moles or changes in moles, especially if there is a change in a mole's shape or color.  Also tell your health care provider if you have a mole that is larger than the size of a pencil eraser.  Always use sunscreen. Apply sunscreen liberally and repeatedly throughout the day.  Protect yourself by wearing long sleeves, pants, a wide-brimmed hat, and sunglasses whenever you are outside. Heart disease, diabetes, and high blood pressure  High blood pressure causes heart disease and increases the risk of stroke. High blood pressure is more likely to develop in:  People who have blood pressure in the high end of the normal range (130-139/85-89 mm Hg).  People who are overweight or obese.  People who are African American.  If you are 59-24 years of age, have your blood pressure checked every 3-5 years. If you are 34 years of age or older, have your blood pressure checked every year. You should have your blood pressure measured twice-once when you are at a hospital or clinic, and once when you are not at a hospital or clinic. Record the average of the two measurements. To check your blood pressure when you are not at a hospital or clinic, you can use:  An automated blood pressure machine at a pharmacy.  A home blood pressure monitor.  If you are between 29 years and 60 years old, ask your health care provider if you should take aspirin to prevent strokes.  Have regular diabetes screenings. This involves taking a blood sample to check your fasting blood sugar level.  If you are at a normal weight and have a low risk for diabetes, have this test once  every three years after 52 years of age.  If you are overweight and have a high risk for diabetes, consider being tested at a younger age or more often. Preventing infection Hepatitis B  If you have a higher risk for hepatitis B, you should be screened for this virus. You are considered at high risk for hepatitis B if:  You were born in a country where hepatitis B is common. Ask your health care provider which countries are considered high risk.  Your parents were born in a high-risk country, and you have not been immunized against hepatitis B (hepatitis B vaccine).  You have HIV or AIDS.  You use needles to inject street drugs.  You live with someone who has hepatitis B.  You have had sex with someone who has hepatitis B.  You get hemodialysis treatment.  You take certain medicines for conditions, including cancer, organ transplantation, and autoimmune conditions. Hepatitis C  Blood testing is recommended for:  Everyone born from [birth year] through 1966.  Anyone with known risk factors for hepatitis C. Sexually transmitted infections (STIs)  You should be screened for sexually transmitted infections (STIs) including gonorrhea and chlamydia if:  You are sexually active and are younger than 52 years of age.  You are older than 52 years of age and your health care provider tells you that you are at risk for this type of infection.  Your sexual activity has changed since you were last screened and you are at an increased risk for chlamydia or gonorrhea. Ask your health care provider if you are at risk.  If you do not have HIV, but are at risk, it may be recommended that you take a prescription medicine daily to prevent HIV infection. This is called pre-exposure prophylaxis (PrEP). You are considered at risk if:  You are sexually active and do not regularly use condoms or know the HIV status of your partner(s).  You take drugs by injection.  You are sexually active with a partner  who has HIV. Talk with your health care provider about whether you are at high risk of being infected with HIV. If you choose to begin PrEP, you should first be tested for HIV. You should then be tested every 3 months for as long as you are taking PrEP. Pregnancy  If you are premenopausal and you may become pregnant, ask your health care provider about preconception counseling.  If you may become pregnant, take 400 to 800 micrograms (mcg) of folic acid  every day.  If you want to prevent pregnancy, talk to your health care provider about birth control (contraception). Osteoporosis and menopause  Osteoporosis is a disease in which the bones lose minerals and strength with aging. This can result in serious bone fractures. Your risk for osteoporosis can be identified using a bone density scan.  If you are 4 years of age or older, or if you are at risk for osteoporosis and fractures, ask your health care provider if you should be screened.  Ask your health care provider whether you should take a calcium or vitamin D supplement to lower your risk for osteoporosis.  Menopause may have certain physical symptoms and risks.  Hormone replacement therapy may reduce some of these symptoms and risks. Talk to your health care provider about whether hormone replacement therapy is right for you. Follow these instructions at home:  Schedule regular health, dental, and eye exams.  Stay current with your immunizations.  Do not use any tobacco products including cigarettes, chewing tobacco, or electronic cigarettes.  If you are pregnant, do not drink alcohol.  If you are breastfeeding, limit how much and how often you drink alcohol.  Limit alcohol intake to no more than 1 drink per day for nonpregnant women. One drink equals 12 ounces of beer, 5 ounces of wine, or 1 ounces of hard liquor.  Do not use street drugs.  Do not share needles.  Ask your health care provider for help if you need support  or information about quitting drugs.  Tell your health care provider if you often feel depressed.  Tell your health care provider if you have ever been abused or do not feel safe at home. This information is not intended to replace advice given to you by your health care provider. Make sure you discuss any questions you have with your health care provider. Document Released: 09/08/2010 Document Revised: 08/01/2015 Document Reviewed: 11/27/2014 Elsevier Interactive Patient Education  2017 [employer].

## 2016-06-22 NOTE — Progress Notes (Signed)
Pre visit review using our clinic review tool, if applicable. No additional management support is needed unless otherwise documented below in the visit note. 

## 2016-06-22 NOTE — Progress Notes (Signed)
Subjective:  Patient ID: [name], female    DOB: Mar 29, 1965  Age: 51 y.o. MRN: [id number]  CC: Annual Exam and Hypertension   HPI [name] presents for a CPX.  She complains of a painful cyst over her LMF that has been worsening over the last few months.  She wants to try a medication to help her lose weight.  Outpatient Medications Prior to Visit  Medication Sig Dispense Refill  . acetaminophen (TYLENOL) 500 MG tablet Take 1 tablet (500 mg total) by mouth every 8 (eight) hours as needed. For pain 30 tablet 0  . dicyclomine (BENTYL) 20 MG tablet Take 1 tablet (20 mg total) by mouth 3 (three) times daily before meals. 90 tablet 3  . pantoprazole (PROTONIX) 40 MG tablet TAKE 1 TABLET(40 MG) BY MOUTH DAILY 90 tablet 2  . traMADol (ULTRAM) 50 MG tablet TAKE 1 TABLET BY MOUTH EVERY 6 HOURS AS NEEDED 65 tablet 0  . methocarbamol (ROBAXIN) 750 MG tablet TAKE 1 TABLET (750 MG TOTAL) BY MOUTH EVERY 8 (EIGHT) HOURS AS NEEDED FOR MUSCLE SPASMS. 90 tablet 0  . metoprolol succinate (TOPROL-XL) 50 MG 24 hr tablet TAKE 1 TABLET (50 MG TOTAL) BY MOUTH DAILY. 90 tablet 0  . 0.9 %  sodium chloride infusion      No facility-administered medications prior to visit.     ROS Review of Systems  Constitutional: Positive for unexpected weight change (wt gain). Negative for activity change, appetite change, chills, diaphoresis and fatigue.  HENT: Negative.   Eyes: Negative.   Respiratory: Positive for apnea. Negative for cough, chest tightness, shortness of breath and wheezing.   Gastrointestinal: Negative for abdominal pain, constipation, diarrhea, nausea and vomiting.  Endocrine: Negative.   Genitourinary: Negative.  Negative for difficulty urinating.  Musculoskeletal: Positive for arthralgias and back pain. Negative for joint swelling, myalgias and neck pain.       She complains of chronic LBP and hip pain and she gets sx relief with robaxin, tramadol, & tylenol  Skin: Negative for  color change, pallor and rash.  Neurological: Negative.   Hematological: Negative for adenopathy. Does not bruise/bleed easily.  Psychiatric/Behavioral: Negative.  Negative for dysphoric mood and sleep disturbance. The patient is not nervous/anxious.     Objective:  BP 134/84 (BP Location: Left Arm, Patient Position: Sitting, Cuff Size: Large)   Pulse 61   Temp 98.4 F (36.9 C) (Oral)   Resp 16   Ht  (1.651 m)   Wt 297 lb (134.7 kg)   SpO2 99%   BMI 49.42 kg/m   BP Readings from Last 3 Encounters:  06/22/16 134/84  02/19/16 128/75  02/13/16 122/84    Wt Readings from Last 3 Encounters:  06/22/16 297 lb (134.7 kg)  02/19/16 293 lb (132.9 kg)  02/13/16 293 lb (132.9 kg)    Physical Exam  Constitutional: She is oriented to person, place, and time. No distress.  HENT:  Mouth/Throat: Oropharynx is clear and moist. No oropharyngeal exudate.  Eyes: Conjunctivae are normal. Right eye exhibits no discharge. Left eye exhibits no discharge. No scleral icterus.  Neck: Normal range of motion. Neck supple. No JVD present. No tracheal deviation present. No thyromegaly present.  Cardiovascular: Normal rate, regular rhythm, normal heart sounds and intact distal pulses.  Exam reveals no gallop and no friction rub.   No murmur heard. Pulmonary/Chest: Effort normal and breath sounds normal. No stridor. No respiratory distress. She has no wheezes. She has no rales. She exhibits no  tenderness.  Abdominal: Soft. Bowel sounds are normal. She exhibits no distension and no mass. There is no tenderness. There is no rebound and no guarding.  Musculoskeletal: Normal range of motion. She exhibits no edema, tenderness or deformity.       Hands: Lymphadenopathy:    She has no cervical adenopathy.  Neurological: She is oriented to person, place, and time.  Skin: Skin is warm and dry. No rash noted. She is not diaphoretic. No erythema. No pallor.  Psychiatric: She has a normal mood and affect. Her  behavior is normal. Judgment and thought content normal.  Vitals reviewed.   Lab Results  Component Value Date   WBC 9.6 06/22/2016   HGB 13.5 06/22/2016   HCT 41.0 06/22/2016   PLT 303.0 06/22/2016   GLUCOSE 86 06/22/2016   CHOL 214 (H) 06/22/2016   TRIG 119.0 06/22/2016   HDL 49.00 06/22/2016   LDLCALC 141 (H) 06/22/2016   ALT 8 06/22/2016   AST 15 06/22/2016   NA 141 06/22/2016   K 4.2 06/22/2016   CL 104 06/22/2016   CREATININE 0.65 06/22/2016   BUN 9 06/22/2016   CO2 29 06/22/2016   TSH 1.80 06/22/2016   INR 1.0 07/06/2007   HGBA1C 6.0 06/22/2016    Dg Chest 2 View  Result Date: 08/19/2015 CLINICAL DATA:  Pain underneath the left breast with nausea. EXAM: CHEST  2 VIEW COMPARISON:  05/15/2015 FINDINGS: Linear density in left lower chest appears stable and could represent mild scarring. Otherwise, the lungs are clear. Heart size is within normal limits and stable. The trachea is midline. No large pleural effusions. Mild degenerative endplate changes in the thoracic spine. IMPRESSION: No active cardiopulmonary disease. Electronically Signed   By: Richarda Overlie M.D.   On: 08/19/2015 19:56    Assessment & Plan:   Gina was seen today for annual exam and hypertension.  Diagnoses and all orders for this visit:  Leukocytosis, unspecified type- Her white blood cell count is normal now, will check flow cytometry to see if there is any concern for lymphoproliferative disease. -     CBC with Differential/Platelet; Future -     Flow Cytometry; Future  Low back pain radiating to both legs -     methocarbamol (ROBAXIN) 750 MG tablet; TAKE 1 TABLET (750 MG TOTAL) BY MOUTH EVERY 8 (EIGHT) HOURS AS NEEDED FOR MUSCLE SPASMS.  Morbid obesity with BMI of 50.0-59.9, adult (HCC)- in addition to lifestyle modifications she will start taking Belviq to reduce her caloric intake -     Lorcaserin HCl ER (BELVIQ XR) 20 MG TB24; Take 1 tablet by mouth daily.  Essential hypertension, benign-  her blood pressure is adequately well controlled. -     metoprolol succinate (TOPROL-XL) 50 MG 24 hr tablet; Take 1 tablet (50 mg total) by mouth daily. -     TSH; Future  Routine general medical examination at a health care facility- exam completed, labs reviewed, vaccines reviewed and updated, her Pap/mammogram/colon cancer screening are all up-to-date, patient had material was given. -     Lipid panel; Future -     Comprehensive metabolic panel; Future  Other abnormal glucose- her A1c is 6.0%, she is prediabetic, no medications are needed, she will start Belviq to help her lose weight and hopefully lower her blood sugars. 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 [name] start on Lorcaserin HCl ER. I am also having her maintain her acetaminophen, pantoprazole, dicyclomine, traMADol, methocarbamol, and metoprolol succinate. We will stop administering sodium chloride.  Meds ordered this encounter  Medications  . Lorcaserin HCl ER (BELVIQ XR) 20 MG TB24    Sig: Take 1 tablet by mouth daily.    Dispense:  30 tablet    Refill:  5  . methocarbamol (ROBAXIN) 750 MG tablet    Sig: TAKE 1 TABLET (750 MG TOTAL) BY MOUTH EVERY 8 (EIGHT) HOURS AS NEEDED FOR MUSCLE SPASMS.    Dispense:  90 tablet    Refill:  3  . metoprolol succinate (TOPROL-XL) 50 MG 24 hr tablet    Sig: Take 1 tablet (50 mg total) by mouth daily.    Dispense:  90 tablet    Refill:  3     Follow-up: Return in about 6 months (around 12/22/2016).  Sanda Linger, MD

## 2016-06-23 ENCOUNTER — Encounter: Payer: Self-pay | Admitting: Internal Medicine

## 2016-06-23 NOTE — Progress Notes (Unsigned)
LABS

## 2016-06-24 ENCOUNTER — Telehealth: Payer: Self-pay | Admitting: Internal Medicine

## 2016-06-24 NOTE — Telephone Encounter (Signed)
Patient called in stating that she got a pnumonia vac on Monday.  States the injection area is swollen, red, hot to the touch and itchy and sore under arm.  Patient would like to know if this is a normal reaction or if she should come in?

## 2016-06-25 NOTE — Telephone Encounter (Signed)
Pt called back. Spoke to pt and pt stated that she does not have a fever. The area is red and swollen. Instructed to take home care for the area and to let us know if she does not see improvement in a few days. Pt stated understanding. Noting in chart that she had a reaction to pneumonia vaccine.

## 2016-06-26 ENCOUNTER — Other Ambulatory Visit (INDEPENDENT_AMBULATORY_CARE_PROVIDER_SITE_OTHER): Payer: BLUE CROSS/BLUE SHIELD

## 2016-06-26 ENCOUNTER — Encounter: Payer: Self-pay | Admitting: Internal Medicine

## 2016-06-26 ENCOUNTER — Ambulatory Visit (INDEPENDENT_AMBULATORY_CARE_PROVIDER_SITE_OTHER): Payer: BLUE CROSS/BLUE SHIELD | Admitting: Internal Medicine

## 2016-06-26 VITALS — BP 132/86 | HR 78 | Ht 65.0 in | Wt 296.0 lb

## 2016-06-26 DIAGNOSIS — I10 Essential (primary) hypertension: Secondary | ICD-10-CM

## 2016-06-26 DIAGNOSIS — L03113 Cellulitis of right upper limb: Secondary | ICD-10-CM

## 2016-06-26 DIAGNOSIS — R300 Dysuria: Secondary | ICD-10-CM | POA: Diagnosis not present

## 2016-06-26 DIAGNOSIS — E785 Hyperlipidemia, unspecified: Secondary | ICD-10-CM

## 2016-06-26 LAB — URINALYSIS, ROUTINE W REFLEX MICROSCOPIC
BILIRUBIN URINE: NEGATIVE
KETONES UR: NEGATIVE
Leukocytes, UA: NEGATIVE
NITRITE: NEGATIVE
PH: 6 (ref 5.0–8.0)
RBC / HPF: NONE SEEN (ref 0–?)
Specific Gravity, Urine: 1.02 (ref 1.000–1.030)
TOTAL PROTEIN, URINE-UPE24: NEGATIVE
UROBILINOGEN UA: 0.2 (ref 0.0–1.0)
Urine Glucose: NEGATIVE
WBC, UA: NONE SEEN (ref 0–?)

## 2016-06-26 MED ORDER — LEVOFLOXACIN 500 MG PO TABS: 500.0000 mg | ORAL_TABLET | Freq: Every day | ORAL | 0 refills | Status: AC

## 2016-06-26 NOTE — Patient Instructions (Signed)
Please take all new medication as prescribed - the antibiotic  Please continue all other medications as before, and refills have been done if requested.  Please have the pharmacy call with any other refills you may need.  Please keep your appointments with your specialists as you may have planned  Please go to the LAB in the Basement (turn left off the elevator) for the tests to be done today  You will be contacted by phone if any changes need to be made immediately.  Otherwise, you will receive a letter about your results with an explanation, but please check with MyChart first.  Please remember to sign up for MyChart if you have not done so, as this will be important to you in the future with finding out test results, communicating by private email, and scheduling acute appointments online when needed.   

## 2016-06-26 NOTE — Progress Notes (Signed)
Pre visit review using our clinic review tool, if applicable. No additional management support is needed unless otherwise documented below in the visit note. 

## 2016-06-26 NOTE — Progress Notes (Signed)
Subjective:    Patient ID: [name], female    DOB: Dec 02, 1965, 51 y.o.   MRN: [id number]  HPI  Here to f/u with CC of Right deltoid area swelling after pneumonia shot apr 16, really became warm, tender, red more yesterday but no f/c Did also note a solitary tender axillary nodule small but palpable that seemed to come up last night. . Did have labs that day but not UA incidentally.. Pt now also c/o dysuria that was present that day, but did not remember to mention this at last visit,  She Did realize this at home later, but did take about 4 doses cipro bid but stopped after arm became abnormal, thinking maybe the cipro caused the arm issue. Denies urinary symptoms such as frequency, urgency, flank pain, hematuria or n/v, or other fever, chills. Pt denies chest pain, increased sob or doe, wheezing, orthopnea, PND, increased LE swelling, palpitations, dizziness or syncope. Past Medical History:  Diagnosis Date  . Arthritis   . GERD (gastroesophageal reflux disease)   . Hiatal hernia   . Hypertension   . Migraines   . Morbid obesity (HCC)   . Sleep apnea        Past Surgical History:  Procedure Laterality Date  . ABDOMINAL HYSTERECTOMY    . ABDOMINAL HYSTERECTOMY  2003  . CARDIOVASCULAR STRESS TEST  07/25/2007   Negative stress echo; ef 55-60%  . ESOPHAGEAL MANOMETRY  03/07/2012   Procedure: ESOPHAGEAL MANOMETRY (EM);  Surgeon: Mardella Layman, MD;  Location: [facility];  Service: Endoscopy;  Laterality: N/A;  . ESOPHAGOGASTRODUODENOSCOPY    . KNEE ARTHROSCOPY  01/2011   left  . KNEE SURGERY      reports that she has never smoked. She has never used smokeless tobacco. She reports that she does not drink alcohol or use drugs. family history includes Cervical cancer in her mother; Diabetes in her sister; Hypertension in her sister. Allergies  Allergen Reactions  . Oxycodone Shortness Of Breath  . Pneumococcal Vaccines Itching and Swelling    Swelling caused by prevnar  (06/22/2016).   Current Outpatient Prescriptions on File Prior to Visit  Medication Sig Dispense Refill  . acetaminophen (TYLENOL) 500 MG tablet Take 1 tablet (500 mg total) by mouth every 8 (eight) hours as needed. For pain 30 tablet 0  . dicyclomine (BENTYL) 20 MG tablet Take 1 tablet (20 mg total) by mouth 3 (three) times daily before meals. 90 tablet 3  . Lorcaserin HCl ER (BELVIQ XR) 20 MG TB24 Take 1 tablet by mouth daily. 30 tablet 5  . methocarbamol (ROBAXIN) 750 MG tablet TAKE 1 TABLET (750 MG TOTAL) BY MOUTH EVERY 8 (EIGHT) HOURS AS NEEDED FOR MUSCLE SPASMS. 90 tablet 3  . metoprolol succinate (TOPROL-XL) 50 MG 24 hr tablet Take 1 tablet (50 mg total) by mouth daily. 90 tablet 3  . pantoprazole (PROTONIX) 40 MG tablet TAKE 1 TABLET(40 MG) BY MOUTH DAILY 90 tablet 2  . traMADol (ULTRAM) 50 MG tablet TAKE 1 TABLET BY MOUTH EVERY 6 HOURS AS NEEDED 65 tablet 0  . [DISCONTINUED] dexlansoprazole (DEXILANT) 60 MG capsule Take 1 capsule (60 mg total) by mouth daily. 30 capsule 11  . [DISCONTINUED] diphenhydrAMINE (BENADRYL) 50 MG capsule Take 50 mg by mouth every 6 (six) hours as needed.    . [DISCONTINUED] diphenhydramine-acetaminophen (TYLENOL PM) 25-500 MG TABS Take 1 tablet by mouth at bedtime as needed. For pain     No current facility-administered medications on file prior to  visit.     Review of Systems  Constitutional: Negative for other unusual diaphoresis or sweats HENT: Negative for ear discharge or swelling Eyes: Negative for other worsening visual disturbances Respiratory: Negative for stridor or other swelling  Gastrointestinal: Negative for worsening distension or other blood Genitourinary: Negative for retention or other urinary change Musculoskeletal: Negative for other MSK pain or swelling Skin: Negative for color change or other new lesions Neurological: Negative for worsening tremors and other numbness  Psychiatric/Behavioral: Negative for worsening agitation or  other fatigue All other system neg per pt    Objective:   Physical Exam BP 132/86   Pulse 78   Ht  (1.651 m)   Wt 296 lb (134.3 kg)   SpO2 98%   BMI 49.26 kg/m  VS noted,  Constitutional: Pt appears in NAD HENT: Head: NCAT.  Right Ear: External ear normal.  Left Ear: External ear normal.  Eyes: . Pupils are equal, round, and reactive to light. Conjunctivae and EOM are normal Nose: without d/c or deformity Neck: Neck supple. Gross normal ROM Cardiovascular: Normal rate and regular rhythm.   Pulmonary/Chest: Effort normal and breath sounds without rales or wheezing.  Abd:  Soft,  ND, + BS, no organomegaly, mild tender low mid abd Neurological: Pt is alert. At baseline orientation, motor grossly intact Skin. No rashes, no LE edema, but right deltoid with 4 cm area red/tender with central firm induration, but no fluctuance or drainage; right axilla with one tender 1/2 cm probable LN tender without fluctuance or drainage Psychiatric: Pt behavior is normal without agitation  No other exam findings  Lab Results  Component Value Date   WBC 9.6 06/22/2016   HGB 13.5 06/22/2016   HCT 41.0 06/22/2016   PLT 303.0 06/22/2016   GLUCOSE 86 06/22/2016   CHOL 214 (H) 06/22/2016   TRIG 119.0 06/22/2016   HDL 49.00 06/22/2016   LDLCALC 141 (H) 06/22/2016   ALT 8 06/22/2016   AST 15 06/22/2016   NA 141 06/22/2016   K 4.2 06/22/2016   CL 104 06/22/2016   CREATININE 0.65 06/22/2016   BUN 9 06/22/2016   CO2 29 06/22/2016   TSH 1.80 06/22/2016   INR 1.0 07/06/2007   HGBA1C 6.0 06/22/2016       Assessment & Plan:

## 2016-06-27 DIAGNOSIS — E785 Hyperlipidemia, unspecified: Secondary | ICD-10-CM | POA: Insufficient documentation

## 2016-06-27 NOTE — Assessment & Plan Note (Signed)
Mild to mod, for antibx course - levaquin,  to f/u any worsening symptoms or concerns 

## 2016-06-27 NOTE — Assessment & Plan Note (Signed)
For UA and cx, may not be helpful after 4 doses cipro leftover at home, but hopefully to be covered by levaquin as well

## 2016-06-27 NOTE — Assessment & Plan Note (Signed)
stable overall by history and exam, recent data reviewed with pt, and pt to continue medical treatment as before,  to f/u any worsening symptoms or concerns BP Readings from Last 3 Encounters:  06/26/16 132/86  06/22/16 134/84  02/19/16 128/75

## 2016-06-27 NOTE — Assessment & Plan Note (Signed)
D/w pt recent lab, for lower chol diet, and defer further management to PCP

## 2016-06-28 LAB — URINE CULTURE

## 2016-07-09 ENCOUNTER — Encounter: Payer: Self-pay | Admitting: Internal Medicine

## 2016-07-09 ENCOUNTER — Ambulatory Visit (INDEPENDENT_AMBULATORY_CARE_PROVIDER_SITE_OTHER): Payer: BLUE CROSS/BLUE SHIELD | Admitting: Internal Medicine

## 2016-07-09 VITALS — BP 110/66 | HR 68 | Ht 64.25 in | Wt 299.0 lb

## 2016-07-09 DIAGNOSIS — K224 Dyskinesia of esophagus: Secondary | ICD-10-CM

## 2016-07-09 DIAGNOSIS — K219 Gastro-esophageal reflux disease without esophagitis: Secondary | ICD-10-CM | POA: Diagnosis not present

## 2016-07-09 DIAGNOSIS — K222 Esophageal obstruction: Secondary | ICD-10-CM

## 2016-07-09 NOTE — Patient Instructions (Signed)
Glad your doing better.  Call us back if you have any more problems.   I appreciate the opportunity to care for you. Stan Headarl Gessner, MD, [facility]

## 2016-07-09 NOTE — Progress Notes (Signed)
   [name] 50 y.o. 03/30/1965 [id number]  Assessment & Plan:   Encounter Diagnoses  Name Primary?  . Esophageal stenosis Yes  . Esophageal dysmotility   . Gastroesophageal reflux disease, esophagitis presence not specified      Doing well After dilation of the GE junction to 20 mm. Reviewed findings today Stay on PPI and return as needed I did explain that she could have some sort of dysmotility problem that will worsen over time and we will observe at this point.  Subjective:   Chief Complaint: Follow-up of swallowing problems after EGD and dilation  HPI Patient presents, she had been seen in December with epigastric pain and dysphagia. EGD revealed a distal esophageal diverticulum and stenosis at the GE junction that was dilated to 20 mm. Since that time and being on pantoprazole she has not experienced epigastric pain heartburn or dysphagia and is happy. Medications, allergies, past medical history, past surgical history, family history and social history are reviewed and updated in the EMR.   Review of Systems As above  Objective:   Physical Exam BP 110/66 (BP Location: Left Wrist, Patient Position: Sitting, Cuff Size: Normal)   Pulse 68   Ht 5' 4.25" (1.632 m)   Wt 299 lb (135.6 kg)   BMI 50.92 kg/m  NAD  15 minutes time spent with patient > half in counseling coordination of care

## 2016-07-13 ENCOUNTER — Emergency Department (HOSPITAL_BASED_OUTPATIENT_CLINIC_OR_DEPARTMENT_OTHER)
Admission: EM | Admit: 2016-07-13 | Discharge: 2016-07-13 | Disposition: A | Discharge status: Home / Self Care | Payer: BLUE CROSS/BLUE SHIELD | Attending: Emergency Medicine | Admitting: Emergency Medicine

## 2016-07-13 ENCOUNTER — Emergency Department (HOSPITAL_BASED_OUTPATIENT_CLINIC_OR_DEPARTMENT_OTHER): Payer: BLUE CROSS/BLUE SHIELD

## 2016-07-13 ENCOUNTER — Encounter: Payer: Self-pay | Admitting: Internal Medicine

## 2016-07-13 ENCOUNTER — Encounter (HOSPITAL_BASED_OUTPATIENT_CLINIC_OR_DEPARTMENT_OTHER): Payer: Self-pay | Admitting: Emergency Medicine

## 2016-07-13 DIAGNOSIS — R002 Palpitations: Secondary | ICD-10-CM | POA: Insufficient documentation

## 2016-07-13 DIAGNOSIS — M549 Dorsalgia, unspecified: Secondary | ICD-10-CM | POA: Diagnosis not present

## 2016-07-13 DIAGNOSIS — I10 Essential (primary) hypertension: Secondary | ICD-10-CM | POA: Diagnosis not present

## 2016-07-13 DIAGNOSIS — Z79899 Other long term (current) drug therapy: Secondary | ICD-10-CM | POA: Diagnosis not present

## 2016-07-13 DIAGNOSIS — R42 Dizziness and giddiness: Secondary | ICD-10-CM | POA: Insufficient documentation

## 2016-07-13 DIAGNOSIS — R51 Headache: Secondary | ICD-10-CM | POA: Insufficient documentation

## 2016-07-13 DIAGNOSIS — R072 Precordial pain: Secondary | ICD-10-CM | POA: Insufficient documentation

## 2016-07-13 HISTORY — DX: Esophagitis, unspecified without bleeding: K20.90

## 2016-07-13 HISTORY — DX: Esophageal obstruction: K22.2

## 2016-07-13 HISTORY — DX: Esophagitis, unspecified: K20.9

## 2016-07-13 LAB — BASIC METABOLIC PANEL
Anion gap: 7 (ref 5–15)
BUN: 10 mg/dL (ref 6–20)
CHLORIDE: 107 mmol/L (ref 101–111)
CO2: 25 mmol/L (ref 22–32)
CREATININE: 0.66 mg/dL (ref 0.44–1.00)
Calcium: 8.9 mg/dL (ref 8.9–10.3)
GFR calc Af Amer: >60 mL/min (ref >60)
GLUCOSE: 90 mg/dL (ref 65–99)
Potassium: 3.7 mmol/L (ref 3.5–5.1)
SODIUM: 139 mmol/L (ref 135–145)

## 2016-07-13 LAB — CBC WITH DIFFERENTIAL/PLATELET
Basophils Absolute: 0.1 10*3/uL (ref 0.0–0.1)
Basophils Relative: 1 %
Eosinophils Absolute: 0.4 10*3/uL (ref 0.0–0.7)
Eosinophils Relative: 4 %
HCT: 37.3 % (ref 36.0–46.0)
Hemoglobin: 12.2 g/dL (ref 12.0–15.0)
Lymphocytes Relative: 39 %
Lymphs Abs: 4.1 10*3/uL — ABNORMAL HIGH (ref 0.7–4.0)
MCH: 28 pg (ref 26.0–34.0)
MCHC: 32.7 g/dL (ref 30.0–36.0)
MCV: 85.7 fL (ref 78.0–100.0)
Monocytes Absolute: 0.9 10*3/uL (ref 0.1–1.0)
Monocytes Relative: 8 %
Neutro Abs: 5.1 10*3/uL (ref 1.7–7.7)
Neutrophils Relative %: 48 %
Platelets: 301 10*3/uL (ref 150–400)
RBC: 4.35 MIL/uL (ref 3.87–5.11)
RDW: 14.3 % (ref 11.5–15.5)
WBC: 10.5 10*3/uL (ref 4.0–10.5)

## 2016-07-13 LAB — TROPONIN I: Troponin I: <0.03 ng/mL (ref <0.03)

## 2016-07-13 NOTE — ED Triage Notes (Signed)
Patient states that she has had intermittent palpitations all day long. Denies any chest pain at this time, reports that earlier she had some

## 2016-07-13 NOTE — ED Notes (Signed)
Pt verbalizes understanding of d/c instructions and denies any further needs at this time. 

## 2016-07-13 NOTE — ED Provider Notes (Signed)
[facility] Note   CSN: [id number] Arrival date & time: 07/13/16  1951  By signing my name below, I, Nelwyn SalisburyJoshua Fowler, attest that this documentation has been prepared under the direction and in the presence of non-physician practitioner, Everlene FarrierWilliam Arieona Swaggerty, PA-C. Electronically Signed: Nelwyn SalisburyJoshua Fowler, Scribe. 07/13/2016. 8:00 PM.  History   Chief Complaint Chief Complaint  Patient presents with  . Palpitations   The history is provided by the patient. No language interpreter was used.    HPI Comments:  Gina HurtSamantha Exley is a 51 y.o. female with pmhx of HTN who presents to the Emergency Department complaining of intermittent heart palpitations onset 3 hours ago. Pt reports associated light-headedness, sweating, headache and resolved chest pain that lasted 1 hour. She reports having precordial pain about 6 hours prior to arrival that resolved within an hour. She describes her palpitations as a "fluttering" that lasts for about an hour PTA and have resolved. Her symptoms began while she was at rest and are not modified by any positions or exertion.  She has experienced similar symptoms in the past and has been prescribed metoprolol for her symptoms. She has had no changes to her medications.  She denies personal or close family history of MI, DVT or PE. She is not a smoker. She does have a history of hypertension. She denies history of hyperlipidemia despite this as a diagnosis. Denies any fevers, SOB, visual disturbance, neck pain, numbness, tingling, weakness, rhinorrhea, congestion or sore throat, leg pain, leg swelling, coughing, syncope, dizziness, or rashes.  No recent travel or shx.   Past Medical History:  Diagnosis Date  . Arthritis   . Esophageal stenosis   . Esophagitis   . GERD (gastroesophageal reflux disease)   . Hiatal hernia   . Hypertension   . Migraines   . Morbid obesity (HCC)   . Sleep apnea         Patient Active Problem List   Diagnosis Date Noted  .  HLD (hyperlipidemia) 06/27/2016  . Right arm cellulitis 06/26/2016  . Dysuria 06/26/2016  . Digital mucous cyst of finger of left hand 06/22/2016  . Gastroesophageal reflux disease with esophagitis 12/20/2014  . Fatty liver disease, nonalcoholic 12/11/2014  . Obstructive sleep apnea 09/28/2013  . Morbid obesity with BMI of 50.0-59.9, adult (HCC) 06/28/2013  . Esophageal dysmotility 06/28/2013  . Other abnormal glucose 06/28/2013  . Leukocytosis 06/28/2013  . Low back pain radiating to both legs 06/28/2013  . Hip pain, bilateral 06/28/2013  . Essential hypertension, benign 11/04/2011  . Routine general medical examination at a health care facility 11/04/2011    Past Surgical History:  Procedure Laterality Date  . ABDOMINAL HYSTERECTOMY    . ABDOMINAL HYSTERECTOMY  2003  . CARDIOVASCULAR STRESS TEST  07/25/2007   Negative stress echo; ef 55-60%  . ESOPHAGEAL MANOMETRY  03/07/2012   Procedure: ESOPHAGEAL MANOMETRY (EM);  Surgeon: Mardella Laymanavid R Patterson, MD;  Location: [facility];  Service: Endoscopy;  Laterality: N/A;  . ESOPHAGOGASTRODUODENOSCOPY    . KNEE ARTHROSCOPY  01/2011   left  . KNEE SURGERY      OB History    No data available       Home Medications    Prior to Admission medications   Medication Sig Start Date End Date Taking? Authorizing Provider  acetaminophen (TYLENOL) 500 MG tablet Take 1 tablet (500 mg total) by mouth every 8 (eight) hours as needed. For pain 12/13/14   Albertine GratesXu, Fang, MD  dicyclomine (BENTYL) 20 MG tablet  Take 1 tablet (20 mg total) by mouth 3 (three) times daily before meals. 02/19/16   Iva Boop, MD  Lorcaserin HCl ER (BELVIQ XR) 20 MG TB24 Take 1 tablet by mouth daily. 06/22/16   [name], MD  methocarbamol (ROBAXIN) 750 MG tablet TAKE 1 TABLET (750 MG TOTAL) BY MOUTH EVERY 8 (EIGHT) HOURS AS NEEDED FOR MUSCLE SPASMS. 06/22/16   [name], MD  metoprolol succinate (TOPROL-XL) 50 MG 24 hr tablet Take 1 tablet (50 mg total) by mouth  daily. 06/22/16   [name], MD  pantoprazole (PROTONIX) 40 MG tablet TAKE 1 TABLET(40 MG) BY MOUTH DAILY 02/11/16   [name], MD  traMADol (ULTRAM) 50 MG tablet TAKE 1 TABLET BY MOUTH EVERY 6 HOURS AS NEEDED 04/16/16   [name], MD    Family History Family History  Problem Relation Age of Onset  . Cervical cancer Mother   . Hypertension Sister   . Diabetes Sister   . Heart disease Neg Hx   . Cancer Neg Hx   . Alcohol abuse Neg Hx   . Early death Neg Hx   . Hearing loss Neg Hx   . Hyperlipidemia Neg Hx   . Kidney disease Neg Hx   . Stroke Neg Hx     Social History Social History  Substance Use Topics  . Smoking status: Never Smoker  . Smokeless tobacco: Never Used  . Alcohol use No     Allergies   Oxycodone and Pneumococcal vaccines   Review of Systems Review of Systems  Constitutional: Negative for chills and fever.  HENT: Negative for congestion, rhinorrhea and sore throat.   Eyes: Negative for visual disturbance.  Respiratory: Negative for cough, shortness of breath and wheezing.   Cardiovascular: Positive for chest pain and palpitations. Negative for leg swelling.  Gastrointestinal: Negative for abdominal pain, diarrhea, nausea and vomiting.  Genitourinary: Negative for dysuria.  Musculoskeletal: Positive for back pain. Negative for joint swelling and neck pain.  Skin: Negative for rash.  Neurological: Positive for light-headedness and headaches. Negative for dizziness, syncope, weakness and numbness.       Negative for Paraesthesia     Physical Exam Updated Vital Signs BP (!) 172/91 (BP Location: Left Arm)   Pulse 67   Temp 98.2 F (36.8 C) (Oral)   Resp 18   Ht 5\' 4"  (1.626 m)   Wt 135.6 kg   SpO2 98%   BMI 51.32 kg/m   Physical Exam  Constitutional: She is oriented to person, place, and time. She appears well-developed and well-nourished. No distress.  Nontoxic appearing.  HENT:  Head: Normocephalic and atraumatic.    Mouth/Throat: Oropharynx is clear and moist.  Eyes: Conjunctivae are normal. Pupils are equal, round, and reactive to light. Right eye exhibits no discharge. Left eye exhibits no discharge.  Neck: Neck supple. No JVD present.  Cardiovascular: Normal rate, regular rhythm, normal heart sounds and intact distal pulses.  Exam reveals no gallop and no friction rub.   No murmur heard. Bilateral DP and PT pulses intact. No lower extremity edema or tenderness. HR 64 on the monitor.   Pulmonary/Chest: Effort normal and breath sounds normal. No stridor. No respiratory distress. She has no wheezes. She has no rales. She exhibits no tenderness.  Lungs are clear to ascultation bilaterally. Symmetric chest expansion bilaterally. No increased work of breathing. No rales or rhonchi.    Abdominal: Soft. There is no tenderness. There is no guarding.  Musculoskeletal: She exhibits no edema or tenderness.  No lower extremity edema or tenderness.  Lymphadenopathy:    She has no cervical adenopathy.  Neurological: She is alert and oriented to person, place, and time. No sensory deficit. She exhibits normal muscle tone. Coordination normal.  Skin: Skin is warm and dry. Capillary refill takes less than 2 seconds. No rash noted. She is not diaphoretic. No erythema. No pallor.  Psychiatric: She has a normal mood and affect. Her behavior is normal.  Nursing note and vitals reviewed.    ED Treatments / Results  DIAGNOSTIC STUDIES:  Oxygen Saturation is 98% on RA, normal by my interpretation.    COORDINATION OF CARE:  8:16 PM Discussed treatment plan with pt at bedside which included CXR and orthostatics and pt agreed to plan.  Labs (all labs ordered are listed, but only abnormal results are displayed) Labs Reviewed  CBC WITH DIFFERENTIAL/PLATELET - Abnormal; Notable for the following:       Result Value   Lymphs Abs 4.1 (*)    All other components within normal limits  BASIC METABOLIC PANEL  TROPONIN I     EKG  EKG Interpretation  Date/Time:  Monday Jul 13 2016 19:59:02 EDT Ventricular Rate:  65 PR Interval:  128 QRS Duration: 72 QT Interval:  416 QTC Calculation: 432 R Axis:   31 Text Interpretation:  Normal sinus rhythm Normal ECG No STEMI. Similar to prior.  Confirmed by LONG MD, JOSHUA [phone]) on 07/13/2016 8:01:28 PM       Radiology Dg Chest 2 View  Result Date: 07/13/2016 CLINICAL DATA:  Palpitations with weakness and central chest pain. EXAM: CHEST  2 VIEW COMPARISON:  08/19/2015. FINDINGS: Cardiomegaly. No active infiltrates or failure. No effusion or pneumothorax. Bones unremarkable. Improved aeration compared with priors. IMPRESSION: No active cardiopulmonary disease. Electronically Signed   By: Elsie Stain M.D.   On: 07/13/2016 20:46    Procedures Procedures (including critical care time)  Medications Ordered in ED Medications - No data to display   Initial Impression / Assessment and Plan / ED Course  I have reviewed the triage vital signs and the nursing notes.  Pertinent labs & imaging results that were available during my care of the patient were reviewed by me and considered in my medical decision making (see chart for details).    This is a 51 y.o. female with pmhx of HTN who presents to the Emergency Department complaining of intermittent heart palpitations onset 3 hours ago. Pt reports associated light-headedness, sweating, headache and resolved chest pain that lasted 1 hour. She reports having precordial pain about 6 hours prior to arrival that resolved within an hour. She describes her palpitations as a "fluttering" that lasts for about an hour PTA and have resolved. Her symptoms began while she was at rest and are not modified by any positions or exertion.  She has experienced similar symptoms in the past and has been prescribed metoprolol for her symptoms. She has had no changes to her medications.  She denies personal or close family history of MI, DVT or  PE.  On exam the patient is afebrile nontoxic appearing. EKG is unremarkable. Normal sinus rhythm. No A. fib. Heart rate remains in the 60s during her ER visit. Lungs are clear to auscultation bilaterally. No lower extremity edema or tenderness. She is not orthostatic. BMP is within normal limits. CBC is unremarkable. Troponin is not elevated. I see no need for delta troponin this patient's chest pain occurred 6 hours  prior to troponin was obtained. Chest x-ray is unremarkable. Low suspicion for PE. Well's criteria negative. At reevaluation patient reports she is feeling better. Workup here is reassuring. Will have her follow-up with her primary care doctor and with cardiology for possible stress test. Patient agrees with this plan. She tells me she will call them tomorrow. I discussed strict and specific return precautions with the patient. I advised the patient to follow-up with their primary care provider this week. I advised the patient to return to the emergency department with new or worsening symptoms or new concerns. The patient verbalized understanding and agreement with plan.    This patient was discussed with Dr. Jacqulyn Bath who agrees with assessment and plan.   Final Clinical Impressions(s) / ED Diagnoses   Final diagnoses:  Palpitations  Precordial pain  Essential hypertension    New Prescriptions New Prescriptions   No medications on file    I personally performed the services described in this documentation, which was scribed in my presence. The recorded information has been reviewed and is accurate.       Everlene Farrier, PA-C 07/13/16 2224    Maia Plan, MD 07/14/16 1141

## 2016-07-13 NOTE — ED Notes (Signed)
edp at bedside  

## 2016-07-25 ENCOUNTER — Other Ambulatory Visit: Payer: Self-pay | Admitting: Internal Medicine

## 2016-07-25 DIAGNOSIS — I10 Essential (primary) hypertension: Secondary | ICD-10-CM

## 2016-08-14 ENCOUNTER — Encounter (INDEPENDENT_AMBULATORY_CARE_PROVIDER_SITE_OTHER): Payer: Self-pay | Admitting: Orthopaedic Surgery

## 2016-08-14 ENCOUNTER — Ambulatory Visit (INDEPENDENT_AMBULATORY_CARE_PROVIDER_SITE_OTHER): Payer: BLUE CROSS/BLUE SHIELD | Admitting: Orthopaedic Surgery

## 2016-08-14 ENCOUNTER — Ambulatory Visit (INDEPENDENT_AMBULATORY_CARE_PROVIDER_SITE_OTHER): Payer: Self-pay

## 2016-08-14 DIAGNOSIS — M79642 Pain in left hand: Secondary | ICD-10-CM | POA: Diagnosis not present

## 2016-08-14 DIAGNOSIS — M79641 Pain in right hand: Secondary | ICD-10-CM

## 2016-08-14 NOTE — Progress Notes (Signed)
Office Visit Note   Patient: [name]           Date of Birth: 1965-08-22           MRN: [id number] Visit Date: 08/14/2016              Requested by: [name], MD [address] [place], [place] [address] PCP: [name], MD   Assessment & Plan: Visit Diagnoses:  1. Bilateral hand pain     Plan: Patient has left middle finger mucous cyst. Referral to Dr. Janee Morn at [facility] orth[facility].  Follow-Up Instructions: Return if symptoms worsen or fail to improve.   Orders:  Orders Placed This Encounter  Procedures  . XR Hand Complete Right  . XR Hand Complete Left   No orders of the defined types were placed in this encounter.     Procedures: No procedures performed   Clinical Data: No additional findings.   Subjective: Chief Complaint  Patient presents with  . Right Hand - Cyst, Pain    CYST IN RING FINGER  . Left Hand - Pain    CYST IN MIDDLE FINGER    [name] is a pleasant 51 year old female who comes in with right ring finger and left middle finger cyst. She has noticed this for about 6 weeks. She denies any injuries. She does have a history of osteoarthritis. Her hands do feel stiff in the morning. She denies any injuries. She states the cyst had not drained her or ulcerated. They do cause her discomfort.    Review of Systems  Constitutional: Negative.   HENT: Negative.   Eyes: Negative.   Respiratory: Negative.   Cardiovascular: Negative.   Endocrine: Negative.   Musculoskeletal: Negative.   Neurological: Negative.   Hematological: Negative.   Psychiatric/Behavioral: Negative.   All other systems reviewed and are negative.    Objective: Vital Signs: There were no vitals taken for this visit.  Physical Exam  Constitutional: She is oriented to person, place, and time. She appears well-developed and well-nourished.  HENT:  Head: Normocephalic and atraumatic.  Eyes: EOM are normal.  Neck: Neck supple.    Pulmonary/Chest: Effort normal.  Abdominal: Soft.  Neurological: She is alert and oriented to person, place, and time.  Skin: Skin is warm. Capillary refill takes less than 2 seconds.  Psychiatric: She has a normal mood and affect. Her behavior is normal. Judgment and thought content normal.  Nursing note and vitals reviewed.   Ortho Exam Bilateral hand exam shows a small mucous cyst on the left middle finger. She does have Heberden's and Bouchard's nodes. Specialty Comments:  No specialty comments available.  Imaging: Xr Hand Complete Left  Result Date: 08/14/2016 osteoarthritis  Xr Hand Complete Right  Result Date: 08/14/2016 osteoarthritis    PMFS History: Patient Active Problem List   Diagnosis Date Noted  . HLD (hyperlipidemia) 06/27/2016  . Right arm cellulitis 06/26/2016  . Dysuria 06/26/2016  . Digital mucous cyst of finger of left hand 06/22/2016  . Gastroesophageal reflux disease with esophagitis 12/20/2014  . Fatty liver disease, nonalcoholic 12/11/2014  . Obstructive sleep apnea 09/28/2013  . Morbid obesity with BMI of 50.0-59.9, adult (HCC) 06/28/2013  . Esophageal dysmotility 06/28/2013  . Other abnormal glucose 06/28/2013  . Leukocytosis 06/28/2013  . Low back pain radiating to both legs 06/28/2013  . Hip pain, bilateral 06/28/2013  . Essential hypertension, benign 11/04/2011  . Routine general medical examination at a health care facility 11/04/2011  Past Medical History:  Diagnosis Date  . Arthritis   . Esophageal stenosis   . Esophagitis   . GERD (gastroesophageal reflux disease)   . Hiatal hernia   . Hypertension   . Migraines   . Morbid obesity (HCC)   . Sleep apnea         Family History  Problem Relation Age of Onset  . Cervical cancer Mother   . Hypertension Sister   . Diabetes Sister   . Heart disease Neg Hx   . Cancer Neg Hx   . Alcohol abuse Neg Hx   . Early death Neg Hx   . Hearing loss Neg Hx   . Hyperlipidemia Neg Hx   .  Kidney disease Neg Hx   . Stroke Neg Hx     Past Surgical History:  Procedure Laterality Date  . ABDOMINAL HYSTERECTOMY    . ABDOMINAL HYSTERECTOMY  2003  . CARDIOVASCULAR STRESS TEST  07/25/2007   Negative stress echo; ef 55-60%  . ESOPHAGEAL MANOMETRY  03/07/2012   Procedure: ESOPHAGEAL MANOMETRY (EM);  Surgeon: Mardella Laymanavid R Patterson, MD;  Location: [facility];  Service: Endoscopy;  Laterality: N/A;  . ESOPHAGOGASTRODUODENOSCOPY    . KNEE ARTHROSCOPY  01/2011   left  . KNEE SURGERY     Social History   Occupational History  . 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 Smoking status: Never Smoker  . Smokeless tobacco: Never Used  . Alcohol use No  . Drug use: No  . Sexual activity: Not on file

## 2016-08-21 ENCOUNTER — Other Ambulatory Visit: Payer: Self-pay | Admitting: Internal Medicine

## 2016-08-24 NOTE — Telephone Encounter (Signed)
Faxed script back to [facility].../lmb 

## 2016-11-29 ENCOUNTER — Encounter (HOSPITAL_COMMUNITY): Payer: Self-pay

## 2016-11-29 ENCOUNTER — Emergency Department (HOSPITAL_COMMUNITY)
Admission: EM | Admit: 2016-11-29 | Discharge: 2016-11-29 | Disposition: A | Discharge status: Home / Self Care | Payer: BLUE CROSS/BLUE SHIELD | Attending: Emergency Medicine | Admitting: Emergency Medicine

## 2016-11-29 ENCOUNTER — Emergency Department (HOSPITAL_COMMUNITY): Payer: BLUE CROSS/BLUE SHIELD

## 2016-11-29 DIAGNOSIS — R110 Nausea: Secondary | ICD-10-CM | POA: Diagnosis not present

## 2016-11-29 DIAGNOSIS — I10 Essential (primary) hypertension: Secondary | ICD-10-CM | POA: Diagnosis not present

## 2016-11-29 DIAGNOSIS — Z79899 Other long term (current) drug therapy: Secondary | ICD-10-CM | POA: Diagnosis not present

## 2016-11-29 DIAGNOSIS — R6883 Chills (without fever): Secondary | ICD-10-CM | POA: Diagnosis not present

## 2016-11-29 DIAGNOSIS — R109 Unspecified abdominal pain: Secondary | ICD-10-CM

## 2016-11-29 DIAGNOSIS — R350 Frequency of micturition: Secondary | ICD-10-CM | POA: Diagnosis not present

## 2016-11-29 LAB — URINALYSIS, ROUTINE W REFLEX MICROSCOPIC
BILIRUBIN URINE: NEGATIVE
GLUCOSE, UA: NEGATIVE mg/dL
HGB URINE DIPSTICK: NEGATIVE
KETONES UR: NEGATIVE mg/dL
Leukocytes, UA: NEGATIVE
NITRITE: NEGATIVE
PH: 5 (ref 5.0–8.0)
Protein, ur: NEGATIVE mg/dL
Specific Gravity, Urine: 1.028 (ref 1.005–1.030)

## 2016-11-29 NOTE — Discharge Instructions (Signed)
Continue current medications. Avoid strenuous activity. No signs of kidney or bladder infection today. Normal CT scan. Follow up with your doctor as needed. Return if worsening symptoms.

## 2016-11-29 NOTE — ED Triage Notes (Signed)
Pt presents for evaluation of ongoing urinary symptoms as well as new bilateral flank pain starting yesterday. Pt recently treated for UTI and took cipro to completion. Reports flank pain, chills, nausea starting yesterday.

## 2016-11-29 NOTE — ED Provider Notes (Signed)
[facility] Provider Note   CSN: [id number] Arrival date & time: 11/29/16  7829     History   Chief Complaint Chief Complaint  Patient presents with  . Flank Pain    HPI [name] is a 51 y.o. female.  HPI [name] is a 51 y.o. female presents to ED with complaint of right flank pain. Pt states she has had urinary urgency, frequency, pressure with urination for the last 2 weeks. She was diagnosed with a urinary tract infection. She was prescribed Cipro which she finished. She states in the last 2 days she has developed right flank pain. She states pain is dull and sharp at times. It does not radiate. She continues to have pressure when she urinates. 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 She states she normally takes tramadol and a muscle relaxant for her symptoms. She states she has been taking his medications which have helped. She denies seeing blood in her urine. No nausea vomiting. No changes in her bowels. Reports chills, however no fever. Denies any vaginal complaints. Nothing making her symptoms better or worse.   Past Medical History:  Diagnosis Date  . Arthritis   . Esophageal stenosis   . Esophagitis   . GERD (gastroesophageal reflux disease)   . Hiatal hernia   . Hypertension   . Migraines   . Morbid obesity (HCC)   . Sleep apnea         Patient Active Problem List   Diagnosis Date Noted  . HLD (hyperlipidemia) 06/27/2016  . Right arm cellulitis 06/26/2016  . Dysuria 06/26/2016  . Digital mucous cyst of finger of left hand 06/22/2016  . Gastroesophageal reflux disease with esophagitis 12/20/2014  . Fatty liver disease, nonalcoholic 12/11/2014  . Obstructive sleep apnea 09/28/2013  . Morbid obesity with BMI of 50.0-59.9, adult (HCC) 06/28/2013  . Esophageal dysmotility 06/28/2013  . Other abnormal glucose 06/28/2013  . Leukocytosis 06/28/2013  . Low back pain radiating to both legs  06/28/2013  . Hip pain, bilateral 06/28/2013  . Essential hypertension, benign 11/04/2011  . Routine general medical examination at a health care facility 11/04/2011    Past Surgical History:  Procedure Laterality Date  . ABDOMINAL HYSTERECTOMY    . ABDOMINAL HYSTERECTOMY  2003  . CARDIOVASCULAR STRESS TEST  07/25/2007   Negative stress echo; ef 55-60%  . ESOPHAGEAL MANOMETRY  03/07/2012   Procedure: ESOPHAGEAL MANOMETRY (EM);  Surgeon: Mardella Layman, MD;  Location: [facility];  Service: Endoscopy;  Laterality: N/A;  . ESOPHAGOGASTRODUODENOSCOPY    . KNEE ARTHROSCOPY  01/2011   left  . KNEE SURGERY      OB History    No data available       Home Medications    Prior to Admission medications   Medication Sig Start Date End Date Taking? Authorizing Provider  acetaminophen (TYLENOL) 500 MG tablet Take 1 tablet (500 mg total) by mouth every 8 (eight) hours as needed. For pain 12/13/14   Albertine Grates, MD  dicyclomine (BENTYL) 20 MG tablet Take 1 tablet (20 mg total) by mouth 3 (three) times daily before meals. 02/19/16   Iva Boop, MD  Lorcaserin HCl ER (BELVIQ XR) 20 MG TB24 Take 1 tablet by mouth daily. 06/22/16   [name], MD  methocarbamol (ROBAXIN) 750 MG tablet TAKE 1 TABLET (750 MG TOTAL) BY MOUTH EVERY 8 (EIGHT) HOURS AS NEEDED FOR MUSCLE SPASMS. 06/22/16   Sanda Linger  L, MD  metoprolol succinate (TOPROL-XL) 50 MG 24 hr tablet Take 1 tablet (50 mg total) by mouth daily. 06/22/16   [name], MD  metoprolol succinate (TOPROL-XL) 50 MG 24 hr tablet TAKE 1 TABLET (50 MG TOTAL) BY MOUTH DAILY. 07/26/16   [name], MD  pantoprazole (PROTONIX) 40 MG tablet TAKE 1 TABLET(40 MG) BY MOUTH DAILY 02/11/16   [name], MD  traMADol (ULTRAM) 50 MG tablet TAKE 1 TABLET BY MOUTH EVERY 6 HOURS AS NEEDED 08/22/16   [name], MD    Family History Family History  Problem Relation Age of Onset  . Cervical cancer Mother   . Hypertension Sister   .  Diabetes Sister   . Heart disease Neg Hx   . Cancer Neg Hx   . Alcohol abuse Neg Hx   . Early death Neg Hx   . Hearing loss Neg Hx   . Hyperlipidemia Neg Hx   . Kidney disease Neg Hx   . Stroke Neg Hx     Social History Social History  Substance Use Topics  . Smoking status: Never Smoker  . Smokeless tobacco: Never Used  . Alcohol use No     Allergies   Oxycodone and Pneumococcal vaccines   Review of Systems Review of Systems  Constitutional: Positive for chills. Negative for fever.  Respiratory: Negative for cough, chest tightness and shortness of breath.   Cardiovascular: Negative for chest pain, palpitations and leg swelling.  Gastrointestinal: Positive for nausea. Negative for abdominal pain, diarrhea and vomiting.  Genitourinary: Positive for dysuria, flank pain and frequency. Negative for pelvic pain.  Musculoskeletal: Negative for arthralgias, myalgias, neck pain and neck stiffness.  Skin: Negative for rash.  Neurological: Negative for dizziness, weakness and headaches.  All other systems reviewed and are negative.    Physical Exam Updated Vital Signs BP 117/68   Pulse 67   Temp 98.1 F (36.7 C) (Oral)   Resp 14   Ht  (1.651 m)   Wt 132.5 kg (292 lb)   SpO2 100%   BMI 48.59 kg/m   Physical Exam  Constitutional: She is oriented to person, place, and time. She appears well-developed and well-nourished. No distress.  HENT:  Head: Normocephalic.  Eyes: Conjunctivae are normal.  Neck: Neck supple.  Cardiovascular: Normal rate, regular rhythm and normal heart sounds.   Pulmonary/Chest: Effort normal and breath sounds normal. No respiratory distress. She has no wheezes. She has no rales.  Abdominal: Soft. Bowel sounds are normal. She exhibits no distension. There is no tenderness. There is no rebound.  Right CVA tenderness. Skin normal, no rash over right flank.  Musculoskeletal: She exhibits no edema.  Neurological: She is alert and oriented to  person, place, and time.  Skin: Skin is warm and dry.  Psychiatric: She has a normal mood and affect. Her behavior is normal.  Nursing note and vitals reviewed.    ED Treatments / Results  Labs (all labs ordered are listed, but only abnormal results are displayed) Labs Reviewed  URINALYSIS, ROUTINE W REFLEX MICROSCOPIC    EKG  EKG Interpretation None       Radiology Ct Renal Stone Study  Result Date: 11/29/2016 CLINICAL DATA:  Bilateral flank pain. Lower abdominal pain and nausea for 1 day. History of esophagitis, hiatal hernia, hysterectomy. EXAM: CT ABDOMEN AND PELVIS WITHOUT CONTRAST TECHNIQUE: Multidetector CT imaging of the abdomen and pelvis was performed following the standard protocol without IV contrast. COMPARISON:  MRI  of the abdomen and pelvis December 12, 2014 and CT abdomen and pelvis February 07, 2014 FINDINGS: LOWER CHEST: Lung bases are clear. The visualized heart size is normal. No pericardial effusion. HEPATOBILIARY: Normal. PANCREAS: Normal. SPLEEN: Normal. ADRENALS/URINARY TRACT: Kidneys are orthotopic, demonstrating normal size and morphology. No nephrolithiasis, hydronephrosis; limited assessment for renal masses on this nonenhanced examination. The unopacified ureters are normal in course and caliber. Urinary bladder is well distended and unremarkable. Normal adrenal glands. STOMACH/BOWEL: Small hiatal hernia and diverticulum, unchanged. The stomach, small and large bowel are normal in course and caliber without inflammatory changes, sensitivity decreased by lack of enteric contrast. Normal appendix. VASCULAR/LYMPHATIC: Aortoiliac vessels are normal in course and caliber. Mild calcific atherosclerosis. No lymphadenopathy by CT size criteria. Small presumably reactive inguinal lymph nodes. REPRODUCTIVE: Status post hysterectomy. OTHER: No intraperitoneal free fluid or free air. MUSCULOSKELETAL: Non-acute. Small fat containing inguinal hernias. Anterior abdominal wall  scarring. Tiny fat containing LEFT spigelian hernia. Moderate sacroiliac osteoarthrosis. Severe lower lumbar facet arthropathy. IMPRESSION: 1. No urolithiasis, obstructive uropathy nor acute intra-abdominal/pelvic process. Aortic Atherosclerosis (ICD10-I70.0). Electronically Signed   By: Awilda Metro M.D.   On: 11/29/2016 16:41    Procedures Procedures (including critical care time)  Medications Ordered in ED Medications - No data to display   Initial Impression / Assessment and Plan / ED Course  I have reviewed the triage vital signs and the nursing notes.  Pertinent labs & imaging results that were available during my care of the patient were reviewed by me and considered in my medical decision making (see chart for details).     Pt with right flank pain. Finished cipro 1 week ago for UTI. Today UA normal. Afebrile. VS normal. Will get ct renal to ro kidney stone.   5:07 PM Ct negative. Question musculoskeletal pain. VS normal. Stable for dc home with close outpatient follow up. Return precautions discussed.   Vitals:   11/29/16 1050 11/29/16 1052 11/29/16 1500 11/29/16 1530  BP: (!) 163/87  117/68 123/68  Pulse: 65  67 64  Resp: 14     Temp: 98.1 F (36.7 C)     TempSrc: Oral     SpO2: 100%  100% 100%  Weight:  132.5 kg (292 lb)    Height:   (1.651 m)       Final Clinical Impressions(s) / ED Diagnoses   Final diagnoses:  Flank pain    New Prescriptions New Prescriptions   No medications on file     Iona Coach 11/29/16 1708    Shaune Pollack, MD 11/29/16 Barry Brunner

## 2017-01-26 LAB — HM MAMMOGRAPHY: HM Mammogram: NORMAL (ref 0–4)

## 2017-02-14 ENCOUNTER — Other Ambulatory Visit: Payer: Self-pay | Admitting: Internal Medicine

## 2017-02-18 ENCOUNTER — Encounter: Payer: Self-pay | Admitting: Internal Medicine

## 2017-02-18 ENCOUNTER — Other Ambulatory Visit: Payer: BLUE CROSS/BLUE SHIELD

## 2017-02-18 ENCOUNTER — Ambulatory Visit: Payer: BLUE CROSS/BLUE SHIELD | Admitting: Internal Medicine

## 2017-02-18 VITALS — BP 134/84 | HR 72 | Temp 97.9°F | Resp 16 | Ht 65.0 in | Wt 298.2 lb

## 2017-02-18 DIAGNOSIS — R300 Dysuria: Secondary | ICD-10-CM

## 2017-02-18 DIAGNOSIS — N3001 Acute cystitis with hematuria: Secondary | ICD-10-CM | POA: Diagnosis not present

## 2017-02-18 LAB — POC URINALSYSI DIPSTICK (AUTOMATED)
Bilirubin, UA: POSITIVE
Blood, UA: POSITIVE
GLUCOSE UA: NEGATIVE
KETONES UA: NEGATIVE
Nitrite, UA: POSITIVE
Protein, UA: NEGATIVE
SPEC GRAV UA: 1.025 (ref 1.010–1.025)
Urobilinogen, UA: 2 E.U./dL — AB
pH, UA: 5.5 (ref 5.0–8.0)

## 2017-02-18 MED ORDER — SULFAMETHOXAZOLE-TRIMETHOPRIM 800-160 MG PO TABS: 1.0000 | ORAL_TABLET | Freq: Two times a day (BID) | ORAL | 0 refills | Status: AC

## 2017-02-18 NOTE — Progress Notes (Signed)
Subjective:  Patient ID: [name], female    DOB: 05/31/1965  Age: 51 y.o. MRN: [id number]  CC: Urinary Tract Infection   HPI [name] presents for a 3-day history of bladder pressure, dysuria, frequency, and urgency.  She has had a few episodes of nausea but she had denies abdominal pain, vomiting, fever, or chills.  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  Outpatient Medications Prior to Visit  Medication Sig Dispense Refill  . acetaminophen (TYLENOL) 500 MG tablet Take 1 tablet (500 mg total) by mouth every 8 (eight) hours as needed. For pain 30 tablet 0  . dicyclomine (BENTYL) 20 MG tablet Take 1 tablet (20 mg total) by mouth 3 (three) times daily before meals. 90 tablet 3  . Lorcaserin HCl ER (BELVIQ XR) 20 MG TB24 Take 1 tablet by mouth daily. 30 tablet 5  . methocarbamol (ROBAXIN) 750 MG tablet TAKE 1 TABLET (750 MG TOTAL) BY MOUTH EVERY 8 (EIGHT) HOURS AS NEEDED FOR MUSCLE SPASMS. 90 tablet 3  . metoprolol succinate (TOPROL-XL) 50 MG 24 hr tablet Take 1 tablet (50 mg total) by mouth daily. 90 tablet 3  . pantoprazole (PROTONIX) 40 MG tablet TAKE 1 TABLET(40 MG) BY MOUTH DAILY 90 tablet 2  . traMADol (ULTRAM) 50 MG tablet TAKE 1 TABLET BY MOUTH EVERY 6 HOURS AS NEEDED 65 tablet 0   No facility-administered medications prior to visit.     ROS Review of Systems  Constitutional: Negative.  Negative for chills, fatigue and fever.  HENT: Negative.   Eyes: Negative.   Respiratory: Negative.  Negative for cough, chest tightness and shortness of breath.   Cardiovascular: Negative for chest pain, palpitations and leg swelling.  Gastrointestinal: Positive for nausea. Negative for abdominal pain, constipation, diarrhea and vomiting.  Endocrine: Negative.   Genitourinary: Positive for dysuria, frequency and pelvic pain. Negative for decreased urine volume, difficulty urinating, flank pain, hematuria, urgency, vaginal  bleeding, vaginal discharge and vaginal pain.  Musculoskeletal: Negative.  Negative for back pain.  Skin: Negative.  Negative for rash.  Allergic/Immunologic: Negative.   Neurological: Negative.  Negative for dizziness, weakness and numbness.  Hematological: Negative for adenopathy. Does not bruise/bleed easily.  Psychiatric/Behavioral: Negative.     Objective:  BP 134/84 (BP Location: Left Arm, Patient Position: Sitting, Cuff Size: Large)   Pulse 72   Temp 97.9 F (36.6 C) (Oral)   Resp 16   Ht 5\' 5"  (1.651 m)   Wt 298 lb 4 oz (135.3 kg)   SpO2 97%   BMI 49.63 kg/m   BP Readings from Last 3 Encounters:  02/18/17 134/84  11/29/16 123/68  07/13/16 (!) 172/91    Wt Readings from Last 3 Encounters:  02/18/17 298 lb 4 oz (135.3 kg)  11/29/16 292 lb (132.5 kg)  07/13/16 299 lb (135.6 kg)    Physical Exam  Constitutional: She is oriented to person, place, and time.  Non-toxic appearance. She does not have a sickly appearance. She does not appear ill. No distress.  Eyes: Conjunctivae are normal. Left eye exhibits no discharge. No scleral icterus.  Neck: Normal range of motion. Neck supple. No JVD present. No thyromegaly present.  Cardiovascular: Normal rate, regular rhythm and normal heart sounds.  Pulmonary/Chest: Effort normal and breath sounds normal.  Abdominal: Soft. Bowel sounds are normal. She exhibits no mass. There is no tenderness. There is no CVA tenderness.  Musculoskeletal: Normal range of motion.  She exhibits no edema, tenderness or deformity.  Neurological: She is alert and oriented to person, place, and time.  Skin: Skin is warm and dry. No rash noted. She is not diaphoretic. No erythema. No pallor.  Vitals reviewed.   Lab Results  Component Value Date   WBC 10.5 07/13/2016   HGB 12.2 07/13/2016   HCT 37.3 07/13/2016   PLT 301 07/13/2016   GLUCOSE 90 07/13/2016   CHOL 214 (H) 06/22/2016   TRIG 119.0 06/22/2016   HDL 49.00 06/22/2016   LDLCALC 141 (H)  06/22/2016   ALT 8 06/22/2016   AST 15 06/22/2016   NA 139 07/13/2016   K 3.7 07/13/2016   CL 107 07/13/2016   CREATININE 0.66 07/13/2016   BUN 10 07/13/2016   CO2 25 07/13/2016   TSH 1.80 06/22/2016   INR 1.0 07/06/2007   HGBA1C 6.0 06/22/2016    Ct Renal Stone Study  Result Date: 11/29/2016 CLINICAL DATA:  Bilateral flank pain. Lower abdominal pain and nausea for 1 day. History of esophagitis, hiatal hernia, hysterectomy. EXAM: CT ABDOMEN AND PELVIS WITHOUT CONTRAST TECHNIQUE: Multidetector CT imaging of the abdomen and pelvis was performed following the standard protocol without IV contrast. COMPARISON:  MRI of the abdomen and pelvis December 12, 2014 and CT abdomen and pelvis February 07, 2014 FINDINGS: LOWER CHEST: Lung bases are clear. The visualized heart size is normal. No pericardial effusion. HEPATOBILIARY: Normal. PANCREAS: Normal. SPLEEN: Normal. ADRENALS/URINARY TRACT: Kidneys are orthotopic, demonstrating normal size and morphology. No nephrolithiasis, hydronephrosis; limited assessment for renal masses on this nonenhanced examination. The unopacified ureters are normal in course and caliber. Urinary bladder is well distended and unremarkable. Normal adrenal glands. STOMACH/BOWEL: Small hiatal hernia and diverticulum, unchanged. The stomach, small and large bowel are normal in course and caliber without inflammatory changes, sensitivity decreased by lack of enteric contrast. Normal appendix. VASCULAR/LYMPHATIC: Aortoiliac vessels are normal in course and caliber. Mild calcific atherosclerosis. No lymphadenopathy by CT size criteria. Small presumably reactive inguinal lymph nodes. REPRODUCTIVE: Status post hysterectomy. OTHER: No intraperitoneal free fluid or free air. MUSCULOSKELETAL: Non-acute. Small fat containing inguinal hernias. Anterior abdominal wall scarring. Tiny fat containing LEFT spigelian hernia. Moderate sacroiliac osteoarthrosis. Severe lower lumbar facet arthropathy.  IMPRESSION: 1. No urolithiasis, obstructive uropathy nor acute intra-abdominal/pelvic process. Aortic Atherosclerosis (ICD10-I70.0). Electronically Signed   By: Awilda Metroourtnay  Bloomer M.D.   On: 11/29/2016 16:41    Assessment & Plan:   [name] was seen today for urinary tract infection.  Diagnoses and all orders for this visit:  Dysuria- Her dipstick UA is positive for nitrites, leukocytes, and RBCs. -     POCT Urinalysis Dipstick (Automated) -     CULTURE, URINE COMPREHENSIVE; Future  Acute cystitis with hematuria- I will empirically treat with Bactrim DS awaiting the results of urine culture.  Will adjust her antibiotic regimen based on the culture and sensitivity. -     CULTURE, URINE COMPREHENSIVE; Future -     sulfamethoxazole-trimethoprim (BACTRIM DS,SEPTRA DS) 800-160 MG tablet; Take 1 tablet by mouth 2 (two) times daily for 7 days.   I am having [name] start on sulfamethoxazole-trimethoprim. I am also having her maintain her acetaminophen, pantoprazole, dicyclomine, Lorcaserin HCl ER, methocarbamol, metoprolol succinate, and traMADol.  Meds ordered this encounter  Medications  . sulfamethoxazole-trimethoprim (BACTRIM DS,SEPTRA DS) 800-160 MG tablet    Sig: Take 1 tablet by mouth 2 (two) times daily for 7 days.    Dispense:  14 tablet    Refill:  0  Follow-up: Return if symptoms worsen or fail to improve.  Scarlette Calico, MD

## 2017-02-18 NOTE — Patient Instructions (Signed)

## 2017-02-21 ENCOUNTER — Encounter: Payer: Self-pay | Admitting: Internal Medicine

## 2017-02-21 ENCOUNTER — Other Ambulatory Visit: Payer: Self-pay | Admitting: Internal Medicine

## 2017-02-21 LAB — CULTURE, URINE COMPREHENSIVE
MICRO NUMBER:: 81402392
SPECIMEN QUALITY: ADEQUATE

## 2017-03-20 ENCOUNTER — Other Ambulatory Visit: Payer: Self-pay | Admitting: Internal Medicine

## 2017-03-20 DIAGNOSIS — K2100 Gastro-esophageal reflux disease with esophagitis, without bleeding: Secondary | ICD-10-CM

## 2017-04-17 ENCOUNTER — Emergency Department (HOSPITAL_BASED_OUTPATIENT_CLINIC_OR_DEPARTMENT_OTHER)
Admission: EM | Admit: 2017-04-17 | Discharge: 2017-04-18 | Disposition: A | Discharge status: Home / Self Care | Payer: BLUE CROSS/BLUE SHIELD | Attending: Physician Assistant | Admitting: Physician Assistant

## 2017-04-17 ENCOUNTER — Encounter (HOSPITAL_BASED_OUTPATIENT_CLINIC_OR_DEPARTMENT_OTHER): Payer: Self-pay | Admitting: *Deleted

## 2017-04-17 ENCOUNTER — Other Ambulatory Visit: Payer: Self-pay

## 2017-04-17 ENCOUNTER — Ambulatory Visit (HOSPITAL_COMMUNITY)
Admission: EM | Admit: 2017-04-17 | Discharge: 2017-04-17 | Disposition: A | Discharge status: Home / Self Care | Payer: BLUE CROSS/BLUE SHIELD | Attending: Physician Assistant | Admitting: Physician Assistant

## 2017-04-17 ENCOUNTER — Encounter (HOSPITAL_COMMUNITY): Payer: Self-pay | Admitting: Emergency Medicine

## 2017-04-17 DIAGNOSIS — G44209 Tension-type headache, unspecified, not intractable: Secondary | ICD-10-CM | POA: Diagnosis not present

## 2017-04-17 DIAGNOSIS — I10 Essential (primary) hypertension: Secondary | ICD-10-CM | POA: Diagnosis not present

## 2017-04-17 DIAGNOSIS — Z79899 Other long term (current) drug therapy: Secondary | ICD-10-CM | POA: Diagnosis not present

## 2017-04-17 DIAGNOSIS — R51 Headache: Secondary | ICD-10-CM | POA: Insufficient documentation

## 2017-04-17 DIAGNOSIS — R519 Headache, unspecified: Secondary | ICD-10-CM

## 2017-04-17 MED ORDER — SUMATRIPTAN SUCCINATE 6 MG/0.5ML ~~LOC~~ SOLN
6.0000 mg | Freq: Once | SUBCUTANEOUS | Status: AC
  Administered 2017-04-17: 6 mg via SUBCUTANEOUS

## 2017-04-17 MED ORDER — SUMATRIPTAN SUCCINATE 6 MG/0.5ML ~~LOC~~ SOLN
SUBCUTANEOUS | Status: AC
  Filled 2017-04-17: qty 0.5

## 2017-04-17 MED ORDER — DIPHENHYDRAMINE HCL 50 MG/ML IJ SOLN
25.0000 mg | Freq: Once | INTRAMUSCULAR | Status: AC
  Administered 2017-04-17: 25 mg via INTRAVENOUS
  Filled 2017-04-17: qty 1

## 2017-04-17 MED ORDER — METOCLOPRAMIDE HCL 5 MG/ML IJ SOLN
10.0000 mg | Freq: Once | INTRAMUSCULAR | Status: AC
  Administered 2017-04-17: 10 mg via INTRAVENOUS
  Filled 2017-04-17: qty 2

## 2017-04-17 NOTE — ED Provider Notes (Signed)
[facility]    CSN: [id number] Arrival date & time: 04/17/17  1209     History   Chief Complaint Chief Complaint  Patient presents with  . Headache    HPI [name] is a 52 y.o. female.   Presents with a posterior headache. She carries a long history of migraines and headaches and has even been evaluated that headache clinic in the past. She presents with a 4 day headache along the right back of her head. She has had these in the past. She has been taking many Ibuprofen and Aleve without benefit and has not had her BP meds this am. She is sensitive to sound and some to light. She denies N,V or dizziness. No vision changes. No recent illness is noted. See remainder of ROS. 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       Past Medical History:  Diagnosis Date  . Arthritis   . Esophageal stenosis   . Esophagitis   . GERD (gastroesophageal reflux disease)   . Hiatal hernia   . Hypertension   . Migraines   . Morbid obesity (HCC)   . Sleep apnea         Patient Active Problem List   Diagnosis Date Noted  . Acute cystitis with hematuria 02/18/2017  . HLD (hyperlipidemia) 06/27/2016  . Right arm cellulitis 06/26/2016  . Dysuria 06/26/2016  . Digital mucous cyst of finger of left hand 06/22/2016  . Gastroesophageal reflux disease with esophagitis 12/20/2014  . Fatty liver disease, nonalcoholic 12/11/2014  . Obstructive sleep apnea 09/28/2013  . Morbid obesity with BMI of 50.0-59.9, adult (HCC) 06/28/2013  . Esophageal dysmotility 06/28/2013  . Other abnormal glucose 06/28/2013  . Leukocytosis 06/28/2013  . Low back pain radiating to both legs 06/28/2013  . Hip pain, bilateral 06/28/2013  . Essential hypertension, benign 11/04/2011  . Routine general medical examination at a health care facility 11/04/2011    Past Surgical History:  Procedure Laterality Date  . ABDOMINAL HYSTERECTOMY    . ABDOMINAL  HYSTERECTOMY  2003  . CARDIOVASCULAR STRESS TEST  07/25/2007   Negative stress echo; ef 55-60%  . ESOPHAGEAL MANOMETRY  03/07/2012   Procedure: ESOPHAGEAL MANOMETRY (EM);  Surgeon: Mardella Layman, MD;  Location: [facility];  Service: Endoscopy;  Laterality: N/A;  . ESOPHAGOGASTRODUODENOSCOPY    . KNEE ARTHROSCOPY  01/2011   left  . KNEE SURGERY      OB History    No data available       Home Medications    Prior to Admission medications   Medication Sig Start Date End Date Taking? Authorizing Provider  acetaminophen (TYLENOL) 500 MG tablet Take 1 tablet (500 mg total) by mouth every 8 (eight) hours as needed. For pain 12/13/14  Yes Albertine Grates, MD  dicyclomine (BENTYL) 20 MG tablet Take 1 tablet (20 mg total) by mouth 3 (three) times daily before meals. 02/19/16  Yes Iva Boop, MD  metoprolol succinate (TOPROL-XL) 50 MG 24 hr tablet Take 1 tablet (50 mg total) by mouth daily. 06/22/16  Yes [name], MD  pantoprazole (PROTONIX) 40 MG tablet TAKE 1 TABLET(40 MG) BY MOUTH DAILY 03/21/17  Yes [name], MD  traMADol (ULTRAM) 50 MG tablet TAKE 1 TABLET BY MOUTH EVERY 6 HOURS AS NEEDED 02/16/17  Yes [name], MD  Lorcaserin HCl ER (BELVIQ XR) 20 MG TB24 Take 1 tablet by  mouth daily. 06/22/16   Etta GrandchildJones, Thomas L, MD  methocarbamol (ROBAXIN) 750 MG tablet TAKE 1 TABLET (750 MG TOTAL) BY MOUTH EVERY 8 (EIGHT) HOURS AS NEEDED FOR MUSCLE SPASMS. 06/22/16   Etta GrandchildJones, Thomas L, MD  dexlansoprazole (DEXILANT) 60 MG capsule Take 1 capsule (60 mg total) by mouth daily. 08/27/11 09/07/11  Mardella LaymanPatterson, David R, MD  diphenhydrAMINE (BENADRYL) 50 MG capsule Take 50 mg by mouth every 6 (six) hours as needed.  08/14/11  [provider]  diphenhydramine-acetaminophen (TYLENOL PM) 25-500 MG TABS Take 1 tablet by mouth at bedtime as needed. For pain  08/14/11  [provider]    Family History Family History  Problem Relation Age of Onset  . Cervical cancer Mother   .  Hypertension Sister   . Diabetes Sister   . Heart disease Neg Hx   . Cancer Neg Hx   . Alcohol abuse Neg Hx   . Early death Neg Hx   . Hearing loss Neg Hx   . Hyperlipidemia Neg Hx   . Kidney disease Neg Hx   . Stroke Neg Hx     Social History Social History   Tobacco Use  . Smoking status: Never Smoker  . Smokeless tobacco: Never Used  Substance Use Topics  . Alcohol use: No  . Drug use: No     Allergies   Oxycodone and Pneumococcal vaccines   Review of Systems Review of Systems  Constitutional: Negative for chills and fever.  HENT: Negative.   Eyes: Positive for photophobia. Negative for pain and visual disturbance.  Gastrointestinal: Negative for nausea and vomiting.  Musculoskeletal: Negative for neck pain.  Neurological: Positive for headaches. Negative for dizziness, seizures, weakness, light-headedness and numbness.     Physical Exam Triage Vital Signs ED Triage Vitals  Enc Vitals Group     BP      Pulse      Resp      Temp      Temp src      SpO2      Weight      Height      Head Circumference      Peak Flow      Pain Score      Pain Loc      Pain Edu?      Excl. in GC?    No data found.  Updated Vital Signs BP (!) 167/81 (BP Location: Left Arm)   Temp 98.3 F (36.8 C) (Oral)   Resp 16   SpO2 98%   Visual Acuity Right Eye Distance:   Left Eye Distance:   Bilateral Distance:    Right Eye Near:   Left Eye Near:    Bilateral Near:     Physical Exam  Constitutional: She is oriented to person, place, and time. She appears well-developed and well-nourished.  HENT:  Head: Normocephalic and atraumatic.  Eyes: EOM are normal. Pupils are equal, round, and reactive to light. Right pupil is reactive. Left pupil is reactive.  Neck: Normal range of motion. Neck supple.  Tenderness to palpation along the right mastoid process  Pulmonary/Chest: Effort normal.  Neurological: She is alert and oriented to person, place, and time. She has normal  strength. She is not disoriented. No cranial nerve deficit or sensory deficit. She displays a negative Romberg sign. Coordination and gait normal.  Skin: Skin is warm and dry.  Psychiatric: She has a normal mood and affect. Her behavior is normal.  Nursing note and vitals  reviewed.    UC Treatments / Results  Labs (all labs ordered are listed, but only abnormal results are displayed) Labs Reviewed - No data to display  EKG  EKG Interpretation None       Radiology No results found.  Procedures Procedures (including critical care time)  Medications Ordered in UC Medications  SUMAtriptan (IMITREX) injection 6 mg (not administered)     Initial Impression / Assessment and Plan / UC Course  I have reviewed the triage vital signs and the nursing notes.  Pertinent labs & imaging results that were available during my care of the patient were reviewed by me and considered in my medical decision making (see chart for details).   Patient with typical tension type headache with known chronic history. No neuro deficit by exam. Treat with IM triptan only given her desire to work. Please f/u with PCP if continues and with PCP for HTN. FU if worsens.    Final Clinical Impressions(s) / UC Diagnoses   Final diagnoses:  None    ED Discharge Orders    None       Controlled Substance Prescriptions [place] Controlled Substance Registry consulted? Not Applicable   Sharin Mons 04/17/17 1331

## 2017-04-17 NOTE — ED Provider Notes (Signed)
[facility] EMERGENCY DEPARTMENT Provider Note   CSN: [id number] Arrival date & time: 04/17/17  1858     History   Chief Complaint Chief Complaint  Patient presents with  . Headache    HPI Gina HurtSamantha [name] is a 52 y.o. female w PMHx migraines, GERD, HTN, presenting to the ED for posterior headache that began on Tuesday.  Patient was seen at urgent care earlier today, and given Imitrex for her headache.  Patient states that provided temporary relief, however headache resumed.  She describes describes it as a throbbing sensation, similar to her history of chronic migraines.  Reports sensitivity to sound and mild nausea. Denies photophobia, vision changes, vomiting, or other complaints.  The history is provided by the patient.    Past Medical History:  Diagnosis Date  . Arthritis   . Esophageal stenosis   . Esophagitis   . GERD (gastroesophageal reflux disease)   . Hiatal hernia   . Hypertension   . Migraines   . Morbid obesity (HCC)   . Sleep apnea         Patient Active Problem List   Diagnosis Date Noted  . Acute cystitis with hematuria 02/18/2017  . HLD (hyperlipidemia) 06/27/2016  . Right arm cellulitis 06/26/2016  . Dysuria 06/26/2016  . Digital mucous cyst of finger of left hand 06/22/2016  . Gastroesophageal reflux disease with esophagitis 12/20/2014  . Fatty liver disease, nonalcoholic 12/11/2014  . Obstructive sleep apnea 09/28/2013  . Morbid obesity with BMI of 50.0-59.9, adult (HCC) 06/28/2013  . Esophageal dysmotility 06/28/2013  . Other abnormal glucose 06/28/2013  . Leukocytosis 06/28/2013  . Low back pain radiating to both legs 06/28/2013  . Hip pain, bilateral 06/28/2013  . Essential hypertension, benign 11/04/2011  . Routine general medical examination at a health care facility 11/04/2011    Past Surgical History:  Procedure Laterality Date  . ABDOMINAL HYSTERECTOMY    . ABDOMINAL HYSTERECTOMY  2003  . CARDIOVASCULAR STRESS TEST   07/25/2007   Negative stress echo; ef 55-60%  . ESOPHAGEAL MANOMETRY  03/07/2012   Procedure: ESOPHAGEAL MANOMETRY (EM);  Surgeon: Mardella Laymanavid R Patterson, MD;  Location: [facility];  Service: Endoscopy;  Laterality: N/A;  . ESOPHAGOGASTRODUODENOSCOPY    . KNEE ARTHROSCOPY  01/2011   left  . KNEE SURGERY      OB History    No data available       Home Medications    Prior to Admission medications   Medication Sig Start Date End Date Taking? Authorizing Provider  acetaminophen (TYLENOL) 500 MG tablet Take 1 tablet (500 mg total) by mouth every 8 (eight) hours as needed. For pain 12/13/14   Albertine GratesXu, Fang, MD  dicyclomine (BENTYL) 20 MG tablet Take 1 tablet (20 mg total) by mouth 3 (three) times daily before meals. 02/19/16   Iva BoopGessner, Carl E, MD  Lorcaserin HCl ER (BELVIQ XR) 20 MG TB24 Take 1 tablet by mouth daily. 06/22/16   Etta GrandchildJones, Thomas L, MD  methocarbamol (ROBAXIN) 750 MG tablet TAKE 1 TABLET (750 MG TOTAL) BY MOUTH EVERY 8 (EIGHT) HOURS AS NEEDED FOR MUSCLE SPASMS. 06/22/16   Etta GrandchildJones, Thomas L, MD  metoprolol succinate (TOPROL-XL) 50 MG 24 hr tablet Take 1 tablet (50 mg total) by mouth daily. 06/22/16   Etta GrandchildJones, Thomas L, MD  pantoprazole (PROTONIX) 40 MG tablet TAKE 1 TABLET(40 MG) BY MOUTH DAILY 03/21/17   Etta GrandchildJones, Thomas L, MD  traMADol (ULTRAM) 50 MG tablet TAKE 1 TABLET BY MOUTH EVERY 6 HOURS AS  NEEDED 02/16/17   [name], MD  dexlansoprazole (DEXILANT) 60 MG capsule Take 1 capsule (60 mg total) by mouth daily. 08/27/11 09/07/11  Mardella Layman, MD    Family History Family History  Problem Relation Age of Onset  . Cervical cancer Mother   . Hypertension Sister   . Diabetes Sister   . Heart disease Neg Hx   . Cancer Neg Hx   . Alcohol abuse Neg Hx   . Early death Neg Hx   . Hearing loss Neg Hx   . Hyperlipidemia Neg Hx   . Kidney disease Neg Hx   . Stroke Neg Hx     Social History Social History   Tobacco Use  . Smoking status: Never Smoker  . Smokeless tobacco: Never  Used  Substance Use Topics  . Alcohol use: No  . Drug use: No     Allergies   Oxycodone and Pneumococcal vaccines   Review of Systems Review of Systems  Constitutional: Negative for fever.  Eyes: Negative for photophobia and visual disturbance.  Gastrointestinal: Positive for nausea. Negative for vomiting.  Musculoskeletal: Negative for neck stiffness.  Neurological: Positive for headaches.  All other systems reviewed and are negative.    Physical Exam Updated Vital Signs BP 136/76   Pulse 63   Temp 98.1 F (36.7 C) (Oral)   Resp 20   Ht 5\' 5"  (1.651 m)   Wt 134.3 kg (296 lb)   SpO2 96%   BMI 49.26 kg/m   Physical Exam  Constitutional: She is oriented to person, place, and time. She appears well-developed and well-nourished. No distress.  HENT:  Head: Normocephalic and atraumatic.  Eyes: Conjunctivae and EOM are normal. Pupils are equal, round, and reactive to light.  Neck: Normal range of motion. Neck supple.  No nuchal rigidity or meningismus  Cardiovascular: Normal rate, regular rhythm, normal heart sounds and intact distal pulses.  Pulmonary/Chest: Effort normal and breath sounds normal. No respiratory distress.  Abdominal: Soft. Bowel sounds are normal. There is no tenderness.  Neurological: She is alert and oriented to person, place, and time.  Mental Status:  Alert, oriented, thought content appropriate, able to give a coherent history. Speech fluent without evidence of aphasia. Able to follow 2 step commands without difficulty.  Cranial Nerves:  II:  Peripheral visual fields grossly normal, pupils equal, round, reactive to light III,IV, VI: ptosis not present, extra-ocular motions intact bilaterally  V,VII: smile symmetric, facial light touch sensation equal VIII: hearing grossly normal to voice  X: uvula elevates symmetrically  XI: bilateral shoulder shrug symmetric and strong XII: midline tongue extension without fassiculations Motor:  Normal tone.  5/5 in upper and lower extremities bilaterally including strong and equal grip strength and dorsiflexion/plantar flexion Sensory: Pinprick and light touch normal in all extremities.  Deep Tendon Reflexes: 2+ and symmetric in the biceps and patella Cerebellar: normal finger-to-nose with bilateral upper extremities Gait: normal gait and balance CV: distal pulses palpable throughout    Skin: Skin is warm.  Psychiatric: She has a normal mood and affect. Her behavior is normal.  Nursing note and vitals reviewed.    ED Treatments / Results  Labs (all labs ordered are listed, but only abnormal results are displayed) Labs Reviewed - No data to display  EKG  EKG Interpretation None       Radiology No results found.  Procedures Procedures (including critical care time)  Medications Ordered in ED Medications  metoCLOPramide (REGLAN) injection 10 mg (10 mg  Intravenous Given 04/17/17 2246)  diphenhydrAMINE (BENADRYL) injection 25 mg (25 mg Intravenous Given 04/17/17 2241)     Initial Impression / Assessment and Plan / ED Course  I have reviewed the triage vital signs and the nursing notes.  Pertinent labs & imaging results that were available during my care of the patient were reviewed by me and considered in my medical decision making (see chart for details).  Clinical Course as of Apr 17 2353  Sat Apr 17, 2017  2345 Pt with improvement in HA and Bp. Reports she is feeling well and ready to go.  [JR]    Clinical Course User Index [JR] Librado Guandique, [place] N, PA-C    Pt HA treated and improved while in ED.  Presentation is like pts typical HA and non concerning for [facility], ICH, Meningitis, or temporal arteritis. Pt is afebrile with no focal neuro deficits, nuchal rigidity, or change in vision. Initially hypertensive, though with improvement after HA improved. Pt is to follow up with neurologist to discuss prophylactic medication. Pt verbalizes understanding and is agreeable with plan to  dc.   Discussed results, findings, treatment and follow up. Patient advised of return precautions. Patient verbalized understanding and agreed with plan.  Final Clinical Impressions(s) / ED Diagnoses   Final diagnoses:  Bad headache    ED Discharge Orders    None       Herbert Marken, [place] N, PA-C 04/17/17 2355    Abelino Derrick, MD 04/17/17 2357

## 2017-04-17 NOTE — ED Notes (Addendum)
Alert, NAD, calm, interactive, resps e/u, speaking in clear complete sentences, no dyspnea noted, skin W&D, VSS, here for migraine HA, h/o same, last previous migraine was ~ 4 months ago, has been to HA clinic years ago, dx'd with migraines, no known triggers, denies light sensitivity, endorses sound sensitivity, also nausea (denies: sob, vomiting, fever, dizziness or visual changes). States, "imitrex helped earlier, but did not last". [facility] Up to b/r with steady gait.

## 2017-04-17 NOTE — ED Triage Notes (Signed)
PT C/O: constant HA onset 4 days... Voices no other concerns  TAKING MEDS: Tramadol, Ibuprofen 800 mg, Imitrex   A&O x4... NAD... Ambulatory

## 2017-04-17 NOTE — Discharge Instructions (Signed)
Please read instructions below. You can take 600 mg of Advil/ibuprofen every 6 hours as needed for headache. Schedule an appointment with a neurologist to be evaluated on your headaches and discuss preventative treatment. Return to the ER for severely worsening headache, vision changes, fever, weakness or numbness, or new or concerning symptoms.

## 2017-04-17 NOTE — ED Triage Notes (Signed)
Ha on right lower head x 5 days. [facility]

## 2017-04-17 NOTE — ED Notes (Signed)
EDPA into room, prior to RN assessment, see PA notes, pending orders.   

## 2017-04-17 NOTE — Discharge Instructions (Signed)
Sorry that you have a headache. The injection will be helpful. You can also take your Robaxin in the am after your skift and lie down. This could be helpful as well. Drink a lot of water. Please f/u with your PCP for high blood pressure. Avoid a lot of NSAIDs as sometimes this can make your headache worse. Feel better.

## 2017-04-18 NOTE — ED Notes (Signed)
Pt given d/c instructions as per chart. Verbalizes understanding. No questions. 

## 2017-04-19 ENCOUNTER — Encounter: Payer: Self-pay | Admitting: Family Medicine

## 2017-04-19 ENCOUNTER — Ambulatory Visit: Payer: BLUE CROSS/BLUE SHIELD | Admitting: Family Medicine

## 2017-04-19 VITALS — BP 166/90 | HR 76 | Temp 98.1°F | Ht 65.0 in | Wt 298.0 lb

## 2017-04-19 DIAGNOSIS — I10 Essential (primary) hypertension: Secondary | ICD-10-CM

## 2017-04-19 NOTE — Progress Notes (Signed)
[name] is a 52 year old nonsmoking female,,,,,, who works at [employer] [facility] [employer] in [place] 31 years,,,,,, who comes in today for evaluation of hypertension  Her blood pressures always been well controlled and 50 mg Toprol daily. She's also gone salt free and has lost 25 pounds in the last 12 months by going carbohydrate free. Weight is down to 298 pounds.  On Saturday she didn't feel well thought she had a migraine headache. She went to an urgent care. Her blood pressure was elevated. She was given Imitrex headache diminished and she was discharged home. Later on that evening the headache recurred this time [facility]. At that juncture her BP was 171/101. She was stabilized and sent home and told to call her doctor Dr. Yetta BarreJones for follow-up. She called today but they were too busy to see her  She's been compliant with her medication  BP (!) 166/90 (BP Location: Left Arm, Patient Position: Sitting, Cuff Size: Large)   Pulse 76   Temp 98.1 F (36.7 C) (Oral)   Ht 5\' 5"  (1.651 m)   Wt 298 lb (135.2 kg)   SpO2 97%   BMI 49.59 kg/m  Well-developed obese female no acute distress weight 298 BP 166/90 right arm sitting position pulse 76 and regular  #1 hypertension not at goal..........Marland Kitchen. since she has tolerated the Toprol well without bradycardia...Marland Kitchen.Marland Kitchen.Marland Kitchen. we will double the dose....... have her monitor blood pressure 3 times daily,,,,,,,,, and follow-up with Dr. Yetta BarreJones. Explained this would take 3-4 weeks to see [name] affect of doubling the dose however we'll get her to see Dr. Yetta BarreJones this week.

## 2017-04-19 NOTE — Patient Instructions (Signed)
Toprol 50 mg,,,,,,,,,,,,,,,,,,,,,,, 1 now,,,,,,,,,, then starting tomorrow one twice daily  Check your blood pressure 3 times daily right arm sitting position  Continue no salt diet  Continue carbohydrate free diet and weight loss  Call Dr. Yetta BarreJones and see him later on this week for follow-up

## 2017-04-23 ENCOUNTER — Telehealth: Payer: Self-pay | Admitting: Internal Medicine

## 2017-04-23 NOTE — Telephone Encounter (Signed)
Copied from CRM [phone]. Topic: Referral - Request >> Apr 23, 2017 10:16 AM Gina ReamerWarren, [name] wrote: Patient called because she needs a referral placed to Cardiology, [facility] for a routine check up. Patient spoke with cardiology office and they told her that since it's been more than three years, she needs a new referral placed so that she can re-establish care with him. Patient originally seen for palpitations and chest pains, but just wants to go back and make sure that everything is good with her heart since she's getting older. Appt scheduled for 06/09/17, office waiting for referral. Please advise.  >> Apr 23, 2017 10:48 AM Peace, Tammy L wrote: Spoke with patient.  Got scheduled with Dr. Yetta BarreJones.

## 2017-04-26 ENCOUNTER — Other Ambulatory Visit: Payer: Self-pay | Admitting: Internal Medicine

## 2017-04-26 DIAGNOSIS — I10 Essential (primary) hypertension: Secondary | ICD-10-CM

## 2017-04-26 NOTE — Telephone Encounter (Signed)
Do you want to see pt first before placing cards referral? Pt stated that she is not having any issues at this time. Please advise.

## 2017-04-27 ENCOUNTER — Encounter: Payer: Self-pay | Admitting: Internal Medicine

## 2017-04-27 ENCOUNTER — Ambulatory Visit: Payer: BLUE CROSS/BLUE SHIELD | Admitting: Internal Medicine

## 2017-04-27 ENCOUNTER — Other Ambulatory Visit (INDEPENDENT_AMBULATORY_CARE_PROVIDER_SITE_OTHER): Payer: BLUE CROSS/BLUE SHIELD

## 2017-04-27 VITALS — BP 164/100 | HR 72 | Temp 98.9°F | Ht 65.0 in | Wt 294.0 lb

## 2017-04-27 DIAGNOSIS — M25551 Pain in right hip: Secondary | ICD-10-CM

## 2017-04-27 DIAGNOSIS — R51 Headache: Secondary | ICD-10-CM

## 2017-04-27 DIAGNOSIS — M25552 Pain in left hip: Secondary | ICD-10-CM

## 2017-04-27 DIAGNOSIS — R270 Ataxia, unspecified: Secondary | ICD-10-CM | POA: Diagnosis not present

## 2017-04-27 DIAGNOSIS — M545 Low back pain, unspecified: Secondary | ICD-10-CM

## 2017-04-27 DIAGNOSIS — R7309 Other abnormal glucose: Secondary | ICD-10-CM | POA: Diagnosis not present

## 2017-04-27 DIAGNOSIS — G4733 Obstructive sleep apnea (adult) (pediatric): Secondary | ICD-10-CM

## 2017-04-27 DIAGNOSIS — R519 Headache, unspecified: Secondary | ICD-10-CM

## 2017-04-27 DIAGNOSIS — M79604 Pain in right leg: Secondary | ICD-10-CM

## 2017-04-27 DIAGNOSIS — E559 Vitamin D deficiency, unspecified: Secondary | ICD-10-CM | POA: Diagnosis not present

## 2017-04-27 DIAGNOSIS — I10 Essential (primary) hypertension: Secondary | ICD-10-CM

## 2017-04-27 LAB — COMPREHENSIVE METABOLIC PANEL
ALT: 8 U/L (ref 0–35)
AST: 13 U/L (ref 0–37)
Albumin: 4.1 g/dL (ref 3.5–5.2)
Alkaline Phosphatase: 84 U/L (ref 39–117)
BUN: 12 mg/dL (ref 6–23)
CALCIUM: 9.8 mg/dL (ref 8.4–10.5)
CO2: 31 meq/L (ref 19–32)
CREATININE: 0.65 mg/dL (ref 0.40–1.20)
Chloride: 101 mEq/L (ref 96–112)
GFR: 122.72 mL/min (ref >60.00)
GLUCOSE: 98 mg/dL (ref 70–99)
Potassium: 3.8 mEq/L (ref 3.5–5.1)
SODIUM: 138 meq/L (ref 135–145)
Total Bilirubin: 0.4 mg/dL (ref 0.2–1.2)
Total Protein: 8.3 g/dL (ref 6.0–8.3)

## 2017-04-27 LAB — URINALYSIS, ROUTINE W REFLEX MICROSCOPIC
BILIRUBIN URINE: NEGATIVE
KETONES UR: NEGATIVE
LEUKOCYTES UA: NEGATIVE
NITRITE: NEGATIVE
Specific Gravity, Urine: 1.015 (ref 1.000–1.030)
TOTAL PROTEIN, URINE-UPE24: NEGATIVE
URINE GLUCOSE: NEGATIVE
UROBILINOGEN UA: 0.2 (ref 0.0–1.0)
pH: 6 (ref 5.0–8.0)

## 2017-04-27 LAB — VITAMIN D 25 HYDROXY (VIT D DEFICIENCY, FRACTURES): VITD: 13.71 ng/mL — AB (ref 30.00–100.00)

## 2017-04-27 LAB — CBC WITH DIFFERENTIAL/PLATELET
BASOS ABS: 0 10*3/uL (ref 0.0–0.1)
BASOS PCT: 0.3 % (ref 0.0–3.0)
EOS ABS: 0.3 10*3/uL (ref 0.0–0.7)
Eosinophils Relative: 2.5 % (ref 0.0–5.0)
HEMATOCRIT: 41.4 % (ref 36.0–46.0)
Hemoglobin: 13.7 g/dL (ref 12.0–15.0)
LYMPHS ABS: 4.1 10*3/uL — AB (ref 0.7–4.0)
LYMPHS PCT: 30.5 % (ref 12.0–46.0)
MCHC: 33.1 g/dL (ref 30.0–36.0)
MCV: 84.5 fl (ref 78.0–100.0)
MONO ABS: 1.2 10*3/uL — AB (ref 0.1–1.0)
Monocytes Relative: 8.9 % (ref 3.0–12.0)
NEUTROS PCT: 57.8 % (ref 43.0–77.0)
Neutro Abs: 7.8 10*3/uL — ABNORMAL HIGH (ref 1.4–7.7)
PLATELETS: 318 10*3/uL (ref 150.0–400.0)
RBC: 4.9 Mil/uL (ref 3.87–5.11)
RDW: 14.5 % (ref 11.5–15.5)
WBC: 13.5 10*3/uL — ABNORMAL HIGH (ref 4.0–10.5)

## 2017-04-27 LAB — HEMOGLOBIN A1C: Hgb A1c MFr Bld: 6 % (ref 4.6–6.5)

## 2017-04-27 MED ORDER — CHLORTHALIDONE 25 MG PO TABS
25.0000 mg | ORAL_TABLET | Freq: Every day | ORAL | 0 refills | Status: DC
Start: 2017-04-27 — End: 2017-06-09

## 2017-04-27 MED ORDER — DIAZEPAM 5 MG PO TABS: 5.0000 mg | ORAL_TABLET | Freq: Two times a day (BID) | ORAL | 0 refills | Status: DC | PRN

## 2017-04-27 MED ORDER — CHOLECALCIFEROL 50 MCG (2000 UT) PO TABS: 1.0000 | ORAL_TABLET | Freq: Every day | ORAL | 1 refills | Status: DC

## 2017-04-27 NOTE — Patient Instructions (Signed)

## 2017-04-27 NOTE — Progress Notes (Signed)
Subjective:  Patient ID: [name], female    DOB: 07/07/1965  Age: 52 y.o. MRN: [id number]  CC: Headache and Hypertension   HPI [name] presents for 2-week history of intermittent headache and a blood pressure elevated up to 170/100.  She describes the headache as diffuse aching.  She recently saw another provider who told her to increase her dose of metoprolol.  That did not lower her blood pressure and caused her to feel fatigued.  She also complains of a few episodes of ataxia feeling like she is being pulled to the left side with mild vertigo and tingling in her right hand.  She has sleep apnea which is not being treated.  She does not take any decongestants or anti-inflammatories.  Outpatient Medications Prior to Visit  Medication Sig Dispense Refill  . methocarbamol (ROBAXIN) 750 MG tablet TAKE 1 TABLET (750 MG TOTAL) BY MOUTH EVERY 8 (EIGHT) HOURS AS NEEDED FOR MUSCLE SPASMS. 90 tablet 3  . metoprolol succinate (TOPROL-XL) 50 MG 24 hr tablet Take 1 tablet (50 mg total) by mouth daily. 90 tablet 3  . pantoprazole (PROTONIX) 40 MG tablet TAKE 1 TABLET(40 MG) BY MOUTH DAILY 90 tablet 0  . traMADol (ULTRAM) 50 MG tablet TAKE 1 TABLET BY MOUTH EVERY 6 HOURS AS NEEDED 65 tablet 0  . acetaminophen (TYLENOL) 500 MG tablet Take 1 tablet (500 mg total) by mouth every 8 (eight) hours as needed. For pain 30 tablet 0   No facility-administered medications prior to visit.     ROS Review of Systems  Constitutional: Negative for diaphoresis, fatigue and unexpected weight change.  HENT: Negative.  Negative for trouble swallowing.   Eyes: Negative for visual disturbance.  Respiratory: Negative for cough, chest tightness, shortness of breath and wheezing.   Cardiovascular: Negative for chest pain, palpitations and leg swelling.  Gastrointestinal: Negative for abdominal pain, constipation, diarrhea, nausea and vomiting.  Endocrine: Negative.   Genitourinary: Negative.  Negative  for difficulty urinating.  Musculoskeletal: Positive for arthralgias and back pain. Negative for gait problem, myalgias and neck pain.  Allergic/Immunologic: Negative.   Neurological: Positive for dizziness and headaches. Negative for speech difficulty, weakness and numbness.  Hematological: Negative.  Negative for adenopathy. Does not bruise/bleed easily.  Psychiatric/Behavioral: Negative.     Objective:  BP (!) 164/100 (BP Location: Right Arm, Patient Position: Sitting, Cuff Size: Large)   Pulse 72   Temp 98.9 F (37.2 C) (Oral)   Ht 5\' 5"  (1.651 m)   Wt 294 lb (133.4 kg)   SpO2 97%   BMI 48.92 kg/m   BP Readings from Last 3 Encounters:  04/27/17 (!) 164/100  04/19/17 (!) 166/90  04/17/17 120/82    Wt Readings from Last 3 Encounters:  04/27/17 294 lb (133.4 kg)  04/19/17 298 lb (135.2 kg)  04/17/17 296 lb (134.3 kg)    Physical Exam  Constitutional: She is oriented to person, place, and time. No distress.  HENT:  Mouth/Throat: Oropharynx is clear and moist. No oropharyngeal exudate.  Eyes: Conjunctivae are normal. Left eye exhibits no discharge. No scleral icterus.  Neck: Normal range of motion. Neck supple. No JVD present. No thyromegaly present.  Cardiovascular: Normal rate, regular rhythm and normal heart sounds. Exam reveals no gallop.  No murmur heard. Pulmonary/Chest: Effort normal and breath sounds normal. No respiratory distress. She has no wheezes. She has no rales. She exhibits no tenderness.  Abdominal: Soft. Bowel sounds are normal. She exhibits no distension and no mass. There is  no tenderness.  Musculoskeletal: Normal range of motion. She exhibits no edema, tenderness or deformity.  Lymphadenopathy:    She has no cervical adenopathy.  Neurological: She is alert and oriented to person, place, and time. She has normal reflexes. She displays normal reflexes. No cranial nerve deficit. She exhibits normal muscle tone. Coordination normal.  Skin: Skin is warm  and dry. No rash noted. She is not diaphoretic. No erythema. No pallor.  Psychiatric: She has a normal mood and affect. Her behavior is normal. Judgment and thought content normal.  Vitals reviewed.   Lab Results  Component Value Date   WBC 13.5 (H) 04/27/2017   HGB 13.7 04/27/2017   HCT 41.4 04/27/2017   PLT 318.0 04/27/2017   GLUCOSE 98 04/27/2017   CHOL 214 (H) 06/22/2016   TRIG 119.0 06/22/2016   HDL 49.00 06/22/2016   LDLCALC 141 (H) 06/22/2016   ALT 8 04/27/2017   AST 13 04/27/2017   NA 138 04/27/2017   K 3.8 04/27/2017   CL 101 04/27/2017   CREATININE 0.65 04/27/2017   BUN 12 04/27/2017   CO2 31 04/27/2017   TSH 1.71 04/27/2017   INR 1.0 07/06/2007   HGBA1C 6.0 04/27/2017    No results found.  Assessment & Plan:   [name] was seen today for headache and hypertension.  Diagnoses and all orders for this visit:  Essential hypertension, benign- Her blood pressure is not adequately well controlled.  Will treat the vitamin D deficiency and I have asked her to have the sleep apnea treated.  This may help lower her blood pressure.  In the meantime will add chlorthalidone to the beta-blocker.  Her lab work is negative for secondary causes or endorgan damage. -     chlorthalidone (HYGROTON) 25 MG tablet; Take 1 tablet (25 mg total) by mouth daily. -     Comprehensive metabolic panel; Future -     CBC with Differential/Platelet; Future -     VITAMIN D 25 Hydroxy (Vit-D Deficiency, Fractures); Future -     Urinalysis, Routine w reflex microscopic; Future -     Thyroid Panel With TSH; Future  Intractable episodic headache, unspecified headache type-she has the worst headache of her life and neurological symptoms but her neuro exam is intact.  I have asked her to undergo an MRI to screen for CVA, mass, bleed, NPH. -     MR Brain Wo Contrast; Future -     diazepam (VALIUM) 5 MG tablet; Take 1 tablet (5 mg total) by mouth every 12 (twelve) hours as needed for  anxiety.  Ataxia- as above -     MR Brain Wo Contrast; Future  Obstructive sleep apnea -     Ambulatory referral to Sleep Studies  Other abnormal glucose- Her A1c is at 6.0%.  She is prediabetic.  Medical therapy is not indicated. -     Hemoglobin A1c; Future  Vitamin D deficiency -     Cholecalciferol 2000 units TABS; Take 1 tablet (2,000 Units total) by mouth daily.  Low back pain radiating to both legs -     traMADol (ULTRAM) 50 MG tablet; Take 1 tablet (50 mg total) by mouth every 6 (six) hours as needed.  Hip pain, bilateral -     traMADol (ULTRAM) 50 MG tablet; Take 1 tablet (50 mg total) by mouth every 6 (six) hours as needed.   I have changed Vernetta Norsworthy's traMADol. I am also having her start on chlorthalidone, diazepam, and Cholecalciferol. Additionally, I  am having her maintain her acetaminophen, methocarbamol, metoprolol succinate, and pantoprazole.  Meds ordered this encounter  Medications  . chlorthalidone (HYGROTON) 25 MG tablet    Sig: Take 1 tablet (25 mg total) by mouth daily.    Dispense:  90 tablet    Refill:  0  . diazepam (VALIUM) 5 MG tablet    Sig: Take 1 tablet (5 mg total) by mouth every 12 (twelve) hours as needed for anxiety.    Dispense:  5 tablet    Refill:  0  . Cholecalciferol 2000 units TABS    Sig: Take 1 tablet (2,000 Units total) by mouth daily.    Dispense:  90 tablet    Refill:  1  . traMADol (ULTRAM) 50 MG tablet    Sig: Take 1 tablet (50 mg total) by mouth every 6 (six) hours as needed.    Dispense:  65 tablet    Refill:  3     Follow-up: Return in about 3 weeks (around 05/18/2017).  Sanda Linger, MD

## 2017-04-28 ENCOUNTER — Other Ambulatory Visit: Payer: Self-pay | Admitting: Internal Medicine

## 2017-04-28 ENCOUNTER — Encounter: Payer: Self-pay | Admitting: Internal Medicine

## 2017-04-28 LAB — THYROID PANEL WITH TSH
Free Thyroxine Index: 2.8 (ref 1.4–3.8)
T3 UPTAKE: 26 % (ref 22–35)
T4, Total: 10.6 ug/dL (ref 5.1–11.9)
TSH: 1.71 mIU/L

## 2017-04-28 MED ORDER — TRAMADOL HCL 50 MG PO TABS: 50.0000 mg | ORAL_TABLET | Freq: Four times a day (QID) | ORAL | 3 refills | Status: DC | PRN

## 2017-04-29 ENCOUNTER — Encounter: Payer: Self-pay | Admitting: Internal Medicine

## 2017-04-29 NOTE — Telephone Encounter (Signed)
Can you help with the brain scan question part of the my chart message?

## 2017-05-04 ENCOUNTER — Ambulatory Visit: Payer: BLUE CROSS/BLUE SHIELD | Admitting: Internal Medicine

## 2017-05-05 ENCOUNTER — Ambulatory Visit
Admission: RE | Admit: 2017-05-05 | Discharge: 2017-05-05 | Disposition: A | Discharge status: Home / Self Care | Payer: BLUE CROSS/BLUE SHIELD | Source: Ambulatory Visit | Attending: Internal Medicine | Admitting: Internal Medicine

## 2017-05-05 ENCOUNTER — Other Ambulatory Visit: Payer: Self-pay | Admitting: Internal Medicine

## 2017-05-05 DIAGNOSIS — R51 Headache: Principal | ICD-10-CM

## 2017-05-05 DIAGNOSIS — R519 Headache, unspecified: Secondary | ICD-10-CM

## 2017-05-05 DIAGNOSIS — R270 Ataxia, unspecified: Secondary | ICD-10-CM

## 2017-05-10 ENCOUNTER — Emergency Department (HOSPITAL_BASED_OUTPATIENT_CLINIC_OR_DEPARTMENT_OTHER): Payer: BLUE CROSS/BLUE SHIELD

## 2017-05-10 ENCOUNTER — Other Ambulatory Visit: Payer: Self-pay

## 2017-05-10 ENCOUNTER — Encounter (HOSPITAL_BASED_OUTPATIENT_CLINIC_OR_DEPARTMENT_OTHER): Payer: Self-pay | Admitting: *Deleted

## 2017-05-10 ENCOUNTER — Emergency Department (HOSPITAL_BASED_OUTPATIENT_CLINIC_OR_DEPARTMENT_OTHER)
Admission: EM | Admit: 2017-05-10 | Discharge: 2017-05-10 | Disposition: A | Discharge status: Home / Self Care | Payer: BLUE CROSS/BLUE SHIELD | Attending: Emergency Medicine | Admitting: Emergency Medicine

## 2017-05-10 DIAGNOSIS — Z79899 Other long term (current) drug therapy: Secondary | ICD-10-CM | POA: Insufficient documentation

## 2017-05-10 DIAGNOSIS — I10 Essential (primary) hypertension: Secondary | ICD-10-CM | POA: Diagnosis not present

## 2017-05-10 DIAGNOSIS — R072 Precordial pain: Secondary | ICD-10-CM | POA: Insufficient documentation

## 2017-05-10 LAB — BASIC METABOLIC PANEL
Anion gap: 9 (ref 5–15)
BUN: 11 mg/dL (ref 6–20)
CHLORIDE: 105 mmol/L (ref 101–111)
CO2: 27 mmol/L (ref 22–32)
CREATININE: 0.55 mg/dL (ref 0.44–1.00)
Calcium: 8.7 mg/dL — ABNORMAL LOW (ref 8.9–10.3)
GFR calc Af Amer: >60 mL/min (ref >60)
GFR calc non Af Amer: >60 mL/min (ref >60)
GLUCOSE: 95 mg/dL (ref 65–99)
Potassium: 3.5 mmol/L (ref 3.5–5.1)
Sodium: 141 mmol/L (ref 135–145)

## 2017-05-10 LAB — CBC
HCT: 36.8 % (ref 36.0–46.0)
Hemoglobin: 11.8 g/dL — ABNORMAL LOW (ref 12.0–15.0)
MCH: 28 pg (ref 26.0–34.0)
MCHC: 32.1 g/dL (ref 30.0–36.0)
MCV: 87.2 fL (ref 78.0–100.0)
PLATELETS: 289 10*3/uL (ref 150–400)
RBC: 4.22 MIL/uL (ref 3.87–5.11)
RDW: 14.1 % (ref 11.5–15.5)
WBC: 11.5 10*3/uL — ABNORMAL HIGH (ref 4.0–10.5)

## 2017-05-10 LAB — TROPONIN I: Troponin I: <0.03 ng/mL (ref <0.03)

## 2017-05-10 LAB — D-DIMER, QUANTITATIVE: D-Dimer, Quant: 0.89 ug/mL-FEU — ABNORMAL HIGH (ref 0.00–0.50)

## 2017-05-10 MED ORDER — IOPAMIDOL (ISOVUE-370) INJECTION 76%
100.0000 mL | Freq: Once | INTRAVENOUS | Status: AC | PRN
  Administered 2017-05-10: 100 mL via INTRAVENOUS

## 2017-05-10 MED ORDER — KETOROLAC TROMETHAMINE 30 MG/ML IJ SOLN
30.0000 mg | Freq: Once | INTRAMUSCULAR | Status: AC
  Administered 2017-05-10: 30 mg via INTRAVENOUS
  Filled 2017-05-10: qty 1

## 2017-05-10 MED ORDER — KETOROLAC TROMETHAMINE 30 MG/ML IJ SOLN: 30.0000 mg | Freq: Once | INTRAMUSCULAR | Status: DC

## 2017-05-10 NOTE — Discharge Instructions (Signed)

## 2017-05-10 NOTE — ED Triage Notes (Signed)
Pt c/o anterior chest pain that started last night and worse this morning. States she took a "muscle relaxer" and" tramadol" without relief. Describes pain as dull  and constant. States "it hurts to breath" denies any nausea or sob. Denies a heart history.

## 2017-05-10 NOTE — ED Notes (Signed)
Pt. returned from XR. 

## 2017-05-10 NOTE — ED Provider Notes (Signed)
Blood pressure (!) 141/87, pulse 73, temperature 97.9 F (36.6 C), resp. rate 20, height 5\' 5"  (1.651 m), weight 133.4 kg (294 lb), SpO2 100 %.  Assuming care from Dr. Wilkie AyeHorton.  In short, [name] is a 52 y.o. female with a chief complaint of Chest Pain .  Refer to the original H&P for additional details.  The current plan of care is to f/u labs and imaging.   EKG Interpretation  Date/Time:  Monday May 10 2017 06:23:06 EST Ventricular Rate:  71 PR Interval:    QRS Duration: 85 QT Interval:  377 QTC Calculation: 410 R Axis:   28 Text Interpretation:  Sinus rhythm Confirmed by Ross MarcusHorton, Courtney ([id number]) on 05/10/2017 6:47:36 AM      07:49 AM Patient with positive d-dimer (0.89). Reviewed remaining labs and imaging. Plan for CT angio to evaluate further for PE. Discussed labs and plan with the patient.   08:20 AM CT angios reviewed with no acute findings.  She states she has an appointment upcoming with a cardiologist.  She will also touch base with her primary care physician.  Encouraged her to return with any new or worsening chest pain symptoms.  She will be taking Tylenol/Motrin for discomfort along with a warm compress as needed. Discussed ED return precautions in detail with the patient.  At this time, I do not feel there is any life-threatening condition present. I have reviewed and discussed all results (EKG, imaging, lab, urine as appropriate), exam findings with patient. I have reviewed nursing notes and appropriate previous records.  I feel the patient is safe to be discharged home without further emergent workup. Discussed usual and customary return precautions. Patient and family (if present) verbalize understanding and are comfortable with this plan.  Patient will follow-up with their primary care provider. If they do not have a primary care provider, information for follow-up has been provided to them. All questions have been answered.  Alona BeneJoshua Ryli Standlee, MD   Maia PlanLong, Annasofia Pohl  G, MD 05/10/17 [phone]

## 2017-05-10 NOTE — ED Provider Notes (Signed)
[facility] EMERGENCY DEPARTMENT Provider Note   CSN: [id number] Arrival date & time: 05/10/17  0610     History   Chief Complaint Chief Complaint  Patient presents with  . Chest Pain    HPI [name] is a 52 y.o. female.  HPI  This is a 52 year old female with a history of esophagitis, hiatal hernia, hypertension, obesity who presents with chest pain.  Patient reports constant ongoing chest pain since last night.  She reports that it is dull and radiates underneath her left breast.  She rates her pain at 10 out of 10.  She also reports worsening of pain with deep inspiration.  No recent coughs or fevers.  Denies shortness of breath, nausea, vomiting, abdominal pain.  She has never had pain like this before.  No known history of heart disease, diabetes, hypertension, hyperlipidemia.  Denies recent travel, hospitalization, leg swelling.  Past Medical History:  Diagnosis Date  . Arthritis   . Esophageal stenosis   . Esophagitis   . GERD (gastroesophageal reflux disease)   . Hiatal hernia   . Hypertension   . Migraines   . Morbid obesity (HCC)   . Sleep apnea         Patient Active Problem List   Diagnosis Date Noted  . Intractable episodic headache 04/27/2017  . Ataxia 04/27/2017  . Vitamin D deficiency 04/27/2017  . HLD (hyperlipidemia) 06/27/2016  . Gastroesophageal reflux disease with esophagitis 12/20/2014  . Fatty liver disease, nonalcoholic 12/11/2014  . Obstructive sleep apnea 09/28/2013  . Morbid obesity with BMI of 50.0-59.9, adult (HCC) 06/28/2013  . Esophageal dysmotility 06/28/2013  . Other abnormal glucose 06/28/2013  . Leukocytosis 06/28/2013  . Low back pain radiating to both legs 06/28/2013  . Hip pain, bilateral 06/28/2013  . Essential hypertension, benign 11/04/2011  . Routine general medical examination at a health care facility 11/04/2011    Past Surgical History:  Procedure Laterality Date  . ABDOMINAL HYSTERECTOMY    .  ABDOMINAL HYSTERECTOMY  2003  . CARDIOVASCULAR STRESS TEST  07/25/2007   Negative stress echo; ef 55-60%  . ESOPHAGEAL MANOMETRY  03/07/2012   Procedure: ESOPHAGEAL MANOMETRY (EM);  Surgeon: Mardella Layman, MD;  Location: [facility];  Service: Endoscopy;  Laterality: N/A;  . ESOPHAGOGASTRODUODENOSCOPY    . KNEE ARTHROSCOPY  01/2011   left  . KNEE SURGERY      OB History    No data available       Home Medications    Prior to Admission medications   Medication Sig Start Date End Date Taking? Authorizing Provider  acetaminophen (TYLENOL) 500 MG tablet Take 1 tablet (500 mg total) by mouth every 8 (eight) hours as needed. For pain 12/13/14   Albertine Grates, MD  chlorthalidone (HYGROTON) 25 MG tablet Take 1 tablet (25 mg total) by mouth daily. 04/27/17   [name], MD  Cholecalciferol 2000 units TABS Take 1 tablet (2,000 Units total) by mouth daily. 04/27/17   [name], MD  diazepam (VALIUM) 5 MG tablet Take 1 tablet (5 mg total) by mouth every 12 (twelve) hours as needed for anxiety. 04/27/17   [name], MD  methocarbamol (ROBAXIN) 750 MG tablet TAKE 1 TABLET (750 MG TOTAL) BY MOUTH EVERY 8 (EIGHT) HOURS AS NEEDED FOR MUSCLE SPASMS. 06/22/16   [name], MD  metoprolol succinate (TOPROL-XL) 50 MG 24 hr tablet Take 1 tablet (50 mg total) by mouth daily. 06/22/16   [name], MD  pantoprazole (PROTONIX) 40 MG tablet TAKE 1 TABLET(40 MG) BY MOUTH DAILY 03/21/17   [name], MD  traMADol (ULTRAM) 50 MG tablet Take 1 tablet (50 mg total) by mouth every 6 (six) hours as needed. 04/28/17   [name], MD  dexlansoprazole (DEXILANT) 60 MG capsule Take 1 capsule (60 mg total) by mouth daily. 08/27/11 09/07/11  Mardella Layman, MD    Family History Family History  Problem Relation Age of Onset  . Cervical cancer Mother   . Hypertension Sister   . Diabetes Sister   . Heart disease Neg Hx   . Cancer Neg Hx   . Alcohol abuse Neg Hx   . Early death Neg Hx   .  Hearing loss Neg Hx   . Hyperlipidemia Neg Hx   . Kidney disease Neg Hx   . Stroke Neg Hx     Social History Social History   Tobacco Use  . Smoking status: Never Smoker  . Smokeless tobacco: Never Used  Substance Use Topics  . Alcohol use: No  . Drug use: No     Allergies   Oxycodone and Pneumococcal vaccines   Review of Systems Review of Systems  Constitutional: Negative for fever.  Respiratory: Negative for shortness of breath.   Cardiovascular: Positive for chest pain. Negative for leg swelling.  Gastrointestinal: Negative for abdominal pain, nausea and vomiting.  Genitourinary: Negative for dysuria.  Skin: Negative for rash.  Neurological: Negative for headaches.  All other systems reviewed and are negative.    Physical Exam Updated Vital Signs BP (!) 141/87   Pulse 73   Temp 97.9 F (36.6 C)   Resp 20   Ht 5\' 5"  (1.651 m)   Wt 133.4 kg (294 lb)   SpO2 100%   BMI 48.92 kg/m   Physical Exam  Constitutional: She is oriented to person, place, and time. She appears well-developed and well-nourished.  Morbidly obese, no acute distress  HENT:  Head: Normocephalic and atraumatic.  Eyes: Pupils are equal, round, and reactive to light.  Neck: Neck supple.  Cardiovascular: Normal rate, regular rhythm, normal heart sounds and normal pulses.  No tenderness to palpation of the anterior chest wall  Pulmonary/Chest: Effort normal. No respiratory distress. She has no wheezes.  Abdominal: Soft. Bowel sounds are normal.  Musculoskeletal:       Right lower leg: She exhibits no edema.       Left lower leg: She exhibits no edema.  Neurological: She is alert and oriented to person, place, and time.  Skin: Skin is warm and dry.  Psychiatric: She has a normal mood and affect.  Nursing note and vitals reviewed.    ED Treatments / Results  Labs (all labs ordered are listed, but only abnormal results are displayed) Labs Reviewed  BASIC METABOLIC PANEL  CBC    TROPONIN I  D-DIMER, QUANTITATIVE (NOT AT [facility])    EKG  EKG Interpretation  Date/Time:  Monday May 10 2017 06:23:06 EST Ventricular Rate:  71 PR Interval:    QRS Duration: 85 QT Interval:  377 QTC Calculation: 410 R Axis:   28 Text Interpretation:  Sinus rhythm Confirmed by Ross Marcus ([id number]) on 05/10/2017 6:47:36 AM       Radiology No results found.  Procedures Procedures (including critical care time)  Medications Ordered in ED Medications  ketorolac (TORADOL) 30 MG/ML injection 30 mg (not administered)     Initial Impression / Assessment and Plan / ED Course  I  have reviewed the triage vital signs and the nursing notes.  Pertinent labs & imaging results that were available during my care of the patient were reviewed by me and considered in my medical decision making (see chart for details).     She presents with chest pain.  Fairly ongoing and atypical.  She does have an extensive history of reflux but this is different for her.  She is nontoxic appearing.  Vital signs notable for blood pressure of 141/87.  Patient was given Toradol.  EKG is nonischemic.  Lab work including troponin and d-dimer are pending.  She is low risk for PE.  If workup is negative, suspect patient can be discharged with cardiology follow-up as one troponin would effectively rule her out in a low risk situation given duration of symptoms.  Final Clinical Impressions(s) / ED Diagnoses   Final diagnoses:  Atypical chest pain    ED Discharge Orders    None       Shon BatonHorton, Courtney F, MD 05/10/17 [phone]

## 2017-05-18 ENCOUNTER — Other Ambulatory Visit (INDEPENDENT_AMBULATORY_CARE_PROVIDER_SITE_OTHER): Payer: BLUE CROSS/BLUE SHIELD

## 2017-05-18 ENCOUNTER — Ambulatory Visit: Payer: BLUE CROSS/BLUE SHIELD | Admitting: Internal Medicine

## 2017-05-18 ENCOUNTER — Encounter: Payer: Self-pay | Admitting: Internal Medicine

## 2017-05-18 VITALS — BP 140/90 | HR 76 | Temp 98.5°F | Resp 16 | Ht 65.0 in | Wt 289.0 lb

## 2017-05-18 DIAGNOSIS — D500 Iron deficiency anemia secondary to blood loss (chronic): Secondary | ICD-10-CM | POA: Diagnosis not present

## 2017-05-18 DIAGNOSIS — I10 Essential (primary) hypertension: Secondary | ICD-10-CM

## 2017-05-18 DIAGNOSIS — D539 Nutritional anemia, unspecified: Secondary | ICD-10-CM

## 2017-05-18 DIAGNOSIS — K2100 Gastro-esophageal reflux disease with esophagitis, without bleeding: Secondary | ICD-10-CM

## 2017-05-18 LAB — CBC WITH DIFFERENTIAL/PLATELET
BASOS ABS: 0.1 10*3/uL (ref 0.0–0.1)
Basophils Relative: 0.6 % (ref 0.0–3.0)
Eosinophils Absolute: 0.3 10*3/uL (ref 0.0–0.7)
Eosinophils Relative: 1.8 % (ref 0.0–5.0)
HCT: 41.4 % (ref 36.0–46.0)
Hemoglobin: 13.9 g/dL (ref 12.0–15.0)
LYMPHS ABS: 4.5 10*3/uL — AB (ref 0.7–4.0)
Lymphocytes Relative: 31.4 % (ref 12.0–46.0)
MCHC: 33.5 g/dL (ref 30.0–36.0)
MCV: 83.7 fl (ref 78.0–100.0)
MONO ABS: 1.1 10*3/uL — AB (ref 0.1–1.0)
Monocytes Relative: 7.9 % (ref 3.0–12.0)
NEUTROS PCT: 58.3 % (ref 43.0–77.0)
Neutro Abs: 8.4 10*3/uL — ABNORMAL HIGH (ref 1.4–7.7)
Platelets: 408 10*3/uL — ABNORMAL HIGH (ref 150.0–400.0)
RBC: 4.95 Mil/uL (ref 3.87–5.11)
RDW: 13.8 % (ref 11.5–15.5)
WBC: 14.4 10*3/uL — ABNORMAL HIGH (ref 4.0–10.5)

## 2017-05-18 LAB — IBC PANEL
Iron: 23 ug/dL — ABNORMAL LOW (ref 42–145)
SATURATION RATIOS: 6.9 % — AB (ref 20.0–50.0)
TRANSFERRIN: 237 mg/dL (ref 212.0–360.0)

## 2017-05-18 LAB — VITAMIN B12: Vitamin B-12: 1127 pg/mL — ABNORMAL HIGH (ref 211–911)

## 2017-05-18 LAB — FOLATE: FOLATE: 17.5 ng/mL (ref >5.9)

## 2017-05-18 LAB — FERRITIN: FERRITIN: 160.5 ng/mL (ref 10.0–291.0)

## 2017-05-18 MED ORDER — NEBIVOLOL HCL 10 MG PO TABS: 10.0000 mg | ORAL_TABLET | Freq: Every day | ORAL | 1 refills | Status: DC

## 2017-05-18 MED ORDER — DEXLANSOPRAZOLE 60 MG PO CPDR: 60.0000 mg | DELAYED_RELEASE_CAPSULE | Freq: Every day | ORAL | 1 refills | Status: DC

## 2017-05-18 MED ORDER — FERROUS SULFATE 325 (65 FE) MG PO TABS: 325.0000 mg | ORAL_TABLET | Freq: Two times a day (BID) | ORAL | 1 refills | Status: DC

## 2017-05-18 NOTE — Patient Instructions (Signed)

## 2017-05-18 NOTE — Progress Notes (Signed)
Subjective:  Patient ID: [name], female    DOB: 1965/05/09  Age: 52 y.o. MRN: [id number]  CC: Hypertension; Gastroesophageal Reflux; and Anemia   HPI Jalesia Loudenslager presents for f/up - She complains that her blood pressure has not been well controlled and she has had a few headaches recently.  Her recent MRI was normal.  She was seen in the ED recently for atypical chest pain and had a CT scan that was negative for pulmonary embolus and she ruled out for ischemia.  She describes the pain as aching and heartburn under her lower breastbone.  She denies odynophagia or dysphagia.  She has tried to control the symptoms with pantoprazole but has not had much improvement.  She denies shortness of breath, diaphoresis, lightheadedness weakness, or dizziness.  Outpatient Medications Prior to Visit  Medication Sig Dispense Refill  . acetaminophen (TYLENOL) 500 MG tablet Take 1 tablet (500 mg total) by mouth every 8 (eight) hours as needed. For pain 30 tablet 0  . chlorthalidone (HYGROTON) 25 MG tablet Take 1 tablet (25 mg total) by mouth daily. 90 tablet 0  . Cholecalciferol 2000 units TABS Take 1 tablet (2,000 Units total) by mouth daily. 90 tablet 1  . diazepam (VALIUM) 5 MG tablet Take 1 tablet (5 mg total) by mouth every 12 (twelve) hours as needed for anxiety. 5 tablet 0  . methocarbamol (ROBAXIN) 750 MG tablet TAKE 1 TABLET (750 MG TOTAL) BY MOUTH EVERY 8 (EIGHT) HOURS AS NEEDED FOR MUSCLE SPASMS. 90 tablet 3  . traMADol (ULTRAM) 50 MG tablet Take 1 tablet (50 mg total) by mouth every 6 (six) hours as needed. 65 tablet 3  . metoprolol succinate (TOPROL-XL) 50 MG 24 hr tablet Take 1 tablet (50 mg total) by mouth daily. 90 tablet 3  . pantoprazole (PROTONIX) 40 MG tablet TAKE 1 TABLET(40 MG) BY MOUTH DAILY 90 tablet 0   No facility-administered medications prior to visit.     ROS Review of Systems  Constitutional: Negative for appetite change, diaphoresis, fatigue and unexpected  weight change.  HENT: Negative.  Negative for trouble swallowing and voice change.   Cardiovascular: Positive for chest pain. Negative for palpitations and leg swelling.  Gastrointestinal: Negative for abdominal pain, diarrhea, nausea and vomiting.  Genitourinary: Negative.  Negative for decreased urine volume, difficulty urinating, dysuria and urgency.  Musculoskeletal: Negative.  Negative for arthralgias and myalgias.  Skin: Negative.  Negative for color change and pallor.  Allergic/Immunologic: Negative.   Neurological: Positive for headaches. Negative for dizziness, weakness and numbness.  Hematological: Negative for adenopathy. Does not bruise/bleed easily.  Psychiatric/Behavioral: Negative.     Objective:  BP 140/90 (BP Location: Left Arm, Patient Position: Sitting, Cuff Size: Large)   Pulse 76   Temp 98.5 F (36.9 C) (Oral)   Resp 16   Ht 5\' 5"  (1.651 m)   Wt 289 lb 0.2 oz (131.1 kg)   SpO2 97%   BMI 48.09 kg/m   BP Readings from Last 3 Encounters:  05/18/17 140/90  05/10/17 131/77  04/27/17 (!) 164/100    Wt Readings from Last 3 Encounters:  05/18/17 289 lb 0.2 oz (131.1 kg)  05/10/17 294 lb (133.4 kg)  04/27/17 294 lb (133.4 kg)    Physical Exam  Constitutional: She is oriented to person, place, and time. No distress.  HENT:  Mouth/Throat: Oropharynx is clear and moist. No oropharyngeal exudate.  Eyes: Conjunctivae and EOM are normal. Pupils are equal, round, and reactive to light. Right eye  exhibits no discharge. Left eye exhibits no discharge. No scleral icterus.  Neck: Normal range of motion. Neck supple. No JVD present. No thyromegaly present.  Cardiovascular: Normal rate, regular rhythm and normal heart sounds. Exam reveals no gallop and no friction rub.  No murmur heard. Pulmonary/Chest: Effort normal and breath sounds normal. No respiratory distress. She has no wheezes. She has no rales.  Abdominal: Soft. Bowel sounds are normal. She exhibits no  distension and no mass. There is no tenderness. There is no guarding.  Musculoskeletal: Normal range of motion. She exhibits no edema, tenderness or deformity.  Lymphadenopathy:    She has no cervical adenopathy.  Neurological: She is alert and oriented to person, place, and time. She displays normal reflexes. No cranial nerve deficit. She exhibits normal muscle tone. Coordination normal.  Skin: Skin is warm and dry. No rash noted. She is not diaphoretic. No erythema. No pallor.  Vitals reviewed.   Lab Results  Component Value Date   WBC 14.4 (H) 05/18/2017   HGB 13.9 05/18/2017   HCT 41.4 05/18/2017   PLT 408.0 (H) 05/18/2017   GLUCOSE 95 05/10/2017   CHOL 214 (H) 06/22/2016   TRIG 119.0 06/22/2016   HDL 49.00 06/22/2016   LDLCALC 141 (H) 06/22/2016   ALT 8 04/27/2017   AST 13 04/27/2017   NA 141 05/10/2017   K 3.5 05/10/2017   CL 105 05/10/2017   CREATININE 0.55 05/10/2017   BUN 11 05/10/2017   CO2 27 05/10/2017   TSH 1.71 04/27/2017   INR 1.0 07/06/2007   HGBA1C 6.0 04/27/2017    Dg Chest 2 View  Result Date: 05/10/2017 CLINICAL DATA:  Chest pain EXAM: CHEST  2 VIEW COMPARISON:  07/13/2016 FINDINGS: The heart size and mediastinal contours are within normal limits. Both lungs are clear. The visualized skeletal structures are unremarkable. IMPRESSION: No active cardiopulmonary disease. Electronically Signed   By: Alcide Clever M.D.   On: 05/10/2017 07:02   Ct Angio Chest Pe W And/or Wo Contrast  Result Date: 05/10/2017 CLINICAL DATA:  Anterior chest pain.  Pulmonary embolism suspected. EXAM: CT ANGIOGRAPHY CHEST WITH CONTRAST TECHNIQUE: Multidetector CT imaging of the chest was performed using the standard protocol during bolus administration of intravenous contrast. Multiplanar CT image reconstructions and MIPs were obtained to evaluate the vascular anatomy. CONTRAST:  ISOVUE-370 IOPAMIDOL (ISOVUE-370) INJECTION 76% COMPARISON:  11/05/2008 FINDINGS: Cardiovascular:  Satisfactory opacification of the pulmonary arteries to the segmental level. No evidence of pulmonary embolism. Normal heart size. No pericardial effusion. Mediastinum/Nodes: Negative Lungs/Pleura: Mild cylindrical bronchiectasis at the bases present for multiple years. Posterior basal segment airways are larger than neighboring vessels, more prominent on the left. Question remote infection. Linear opacities at the right more than left base that is new and consistent with atelectasis. There is no edema, consolidation, effusion, or pneumothorax. Upper Abdomen: Negative Musculoskeletal: Thoracic spondylosis. No acute or aggressive finding. Review of the MIP images confirms the above findings. IMPRESSION: 1. Negative for pulmonary embolism. 2. Mild atelectasis at the bases. 3. Mild cylindrical bronchiectasis in the lower lobes that is stable over multiple years. Electronically Signed   By: Marnee Spring M.D.   On: 05/10/2017 08:16    Mr Brain Wo Contrast  Result Date: 05/19/2017 CLINICAL DATA:  Headache, acute, severe, worst of life. Headaches for 3 weeks. EXAM: MRI HEAD WITHOUT CONTRAST TECHNIQUE: Multiplanar, multiecho pulse sequences of the brain and surrounding structures were obtained without intravenous contrast. COMPARISON:  Head CT 07/14/2006 FINDINGS: Brain: No infarction,  hemorrhage, hydrocephalus, extra-axial collection or mass lesion. No atrophy or white matter disease. Vascular: Major flow voids are preserved Skull and upper cervical spine: C3-4 degenerative disc narrowing. No evidence of marrow lesion. Sinuses/Orbits: Small mucous retention cyst on the floor of the left maxillary sinus. No active sinusitis. Negative orbits. IMPRESSION: Normal appearance of the brain.  No explanation for headache. Electronically Signed   By: Marnee SpringJonathon  Watts M.D.   On: 05/19/2017 13:04    Assessment & Plan:   [name] was seen today for hypertension, gastroesophageal reflux and anemia.  Diagnoses and all  orders for this visit:  Gastroesophageal reflux disease with esophagitis- I think her chest pain is related to undertreated GERD.  I have asked her to upgrade to a more potent and more effective PPI. -     dexlansoprazole (DEXILANT) 60 MG capsule; Take 1 capsule (60 mg total) by mouth daily. -     CBC with Differential/Platelet; Future  Essential hypertension, benign- Her blood pressure is not adequately well controlled and she is symptomatic.  Will add nebivolol to the thiazide diuretic for better blood pressure control. -     nebivolol (BYSTOLIC) 10 MG tablet; Take 1 tablet (10 mg total) by mouth daily.  Deficiency anemia- Her H&H are normal now but she is deficient in iron.  Will start iron replacement therapy.  Will also screen for other vitamin deficiencies. -     CBC with Differential/Platelet; Future -     IBC panel; Future -     Folate; Future -     Ferritin; Future -     Vitamin B1; Future -     Vitamin B12; Future  Iron deficiency anemia due to chronic blood loss- She will start iron replacement therapy.  I have asked her to see GI to see if she needs to be screened for GI sources of blood loss. -     Ambulatory referral to Gastroenterology -     ferrous sulfate 325 (65 FE) MG tablet; Take 1 tablet (325 mg total) by mouth 2 (two) times daily with a meal.   I have discontinued Shanielle Smaltz's metoprolol succinate and pantoprazole. I am also having her start on dexlansoprazole, nebivolol, and ferrous sulfate. Additionally, I am having her maintain her acetaminophen, methocarbamol, chlorthalidone, diazepam, Cholecalciferol, and traMADol.  Meds ordered this encounter  Medications  . dexlansoprazole (DEXILANT) 60 MG capsule    Sig: Take 1 capsule (60 mg total) by mouth daily.    Dispense:  90 capsule    Refill:  1  . nebivolol (BYSTOLIC) 10 MG tablet    Sig: Take 1 tablet (10 mg total) by mouth daily.    Dispense:  90 tablet    Refill:  1  . ferrous sulfate 325 (65 FE) MG  tablet    Sig: Take 1 tablet (325 mg total) by mouth 2 (two) times daily with a meal.    Dispense:  180 tablet    Refill:  1     Follow-up: Return in about 3 months (around 08/18/2017).  Sanda Lingerhomas Yulissa Needham, MD

## 2017-05-19 ENCOUNTER — Other Ambulatory Visit: Payer: Self-pay | Admitting: Internal Medicine

## 2017-05-19 ENCOUNTER — Encounter: Payer: Self-pay | Admitting: Internal Medicine

## 2017-05-19 ENCOUNTER — Ambulatory Visit
Admission: RE | Admit: 2017-05-19 | Discharge: 2017-05-19 | Disposition: A | Discharge status: Home / Self Care | Payer: BLUE CROSS/BLUE SHIELD | Source: Ambulatory Visit | Attending: Internal Medicine | Admitting: Internal Medicine

## 2017-05-19 DIAGNOSIS — R270 Ataxia, unspecified: Secondary | ICD-10-CM

## 2017-05-19 DIAGNOSIS — R51 Headache: Principal | ICD-10-CM

## 2017-05-19 DIAGNOSIS — R519 Headache, unspecified: Secondary | ICD-10-CM

## 2017-05-21 LAB — VITAMIN B1: Vitamin B1 (Thiamine): 8 nmol/L (ref 8–30)

## 2017-05-24 ENCOUNTER — Encounter: Payer: Self-pay | Admitting: Internal Medicine

## 2017-05-24 ENCOUNTER — Other Ambulatory Visit: Payer: Self-pay | Admitting: Internal Medicine

## 2017-05-30 ENCOUNTER — Other Ambulatory Visit: Payer: Self-pay | Admitting: Internal Medicine

## 2017-05-30 DIAGNOSIS — M79604 Pain in right leg: Secondary | ICD-10-CM

## 2017-05-30 DIAGNOSIS — M545 Low back pain: Secondary | ICD-10-CM

## 2017-05-31 ENCOUNTER — Telehealth: Payer: Self-pay

## 2017-05-31 ENCOUNTER — Institutional Professional Consult (permissible substitution): Payer: BLUE CROSS/BLUE SHIELD | Admitting: Neurology

## 2017-05-31 NOTE — Telephone Encounter (Signed)
Pt did not show for their appt with Dr. Athar today.  

## 2017-06-01 ENCOUNTER — Encounter: Payer: Self-pay | Admitting: Neurology

## 2017-06-09 ENCOUNTER — Encounter: Payer: Self-pay | Admitting: Cardiovascular Disease

## 2017-06-09 ENCOUNTER — Encounter: Payer: Self-pay | Admitting: Internal Medicine

## 2017-06-09 ENCOUNTER — Ambulatory Visit: Payer: BLUE CROSS/BLUE SHIELD | Admitting: Cardiovascular Disease

## 2017-06-09 VITALS — BP 128/84 | HR 72 | Ht 65.0 in | Wt 294.1 lb

## 2017-06-09 DIAGNOSIS — R002 Palpitations: Secondary | ICD-10-CM | POA: Diagnosis not present

## 2017-06-09 DIAGNOSIS — R079 Chest pain, unspecified: Secondary | ICD-10-CM | POA: Diagnosis not present

## 2017-06-09 NOTE — Patient Instructions (Signed)

## 2017-06-09 NOTE — Progress Notes (Signed)
Cardiology Office Note:    Date:  06/09/2017   ID:  [name], DOB 05/02/1965, MRN [id number]  PCP:  Etta GrandchildJones, Thomas L, MD  Cardiologist:  No primary care provider on file.   Referring MD: Etta GrandchildJones, Thomas L, MD   Chief Complaint  Patient presents with  . Hypertension  . Chest Pain    History of Present Illness:    [name] is a 52 y.o. female with a hx of hypertension.  She has been recently having some atypical episodes of chest pain. She was recently seen in the emergency room for atypical chest pain.  A CT scan ruled out pulmonary embolus.  She also ruled out for myocardial infarction.  She describes pain as a aching and heartburn-like sensation underneath her breastbone.   Was started on Dexilant by her primary MD and the pains have resolved.   She has been under lots of stress.  Was found to have a low vit D level, was not getting nearly enough sleep. ( 10 hours of sleep in a week)   Still has palpitations .  Is sleeping a bit better.   Works 3rd shift at [employer]   Has been getting some exercise.   Is limited by some issues with her right foot.  Was up to 5 miles a day     Past Medical History:  Diagnosis Date  . Arthritis   . Esophageal stenosis   . Esophagitis   . GERD (gastroesophageal reflux disease)   . Hiatal hernia   . Hypertension   . Migraines   . Morbid obesity (HCC)   . Sleep apnea         Past Surgical History:  Procedure Laterality Date  . ABDOMINAL HYSTERECTOMY    . ABDOMINAL HYSTERECTOMY  2003  . CARDIOVASCULAR STRESS TEST  07/25/2007   Negative stress echo; ef 55-60%  . ESOPHAGEAL MANOMETRY  03/07/2012   Procedure: ESOPHAGEAL MANOMETRY (EM);  Surgeon: Mardella Laymanavid R Patterson, MD;  Location: [facility];  Service: Endoscopy;  Laterality: N/A;  . ESOPHAGOGASTRODUODENOSCOPY    . KNEE ARTHROSCOPY  01/2011   left  . KNEE SURGERY      Current Medications: Current Meds  Medication Sig  . acetaminophen (TYLENOL) 500 MG  tablet Take 1 tablet (500 mg total) by mouth every 8 (eight) hours as needed. For pain  . Cholecalciferol 2000 units TABS Take 1 tablet (2,000 Units total) by mouth daily.  Marland Kitchen. dexlansoprazole (DEXILANT) 60 MG capsule Take 1 capsule (60 mg total) by mouth daily.  . ferrous sulfate 325 (65 FE) MG tablet Take 1 tablet (325 mg total) by mouth 2 (two) times daily with a meal.  . methocarbamol (ROBAXIN) 750 MG tablet TAKE 1 TABLET(750 MG) BY MOUTH EVERY 8 HOURS AS NEEDED FOR MUSCLE SPASMS  . nebivolol (BYSTOLIC) 10 MG tablet Take 1 tablet (10 mg total) by mouth daily.  . traMADol (ULTRAM) 50 MG tablet Take 1 tablet (50 mg total) by mouth every 6 (six) hours as needed.     Allergies:   Oxycodone and Pneumococcal vaccines   Social History   Socioeconomic History  . Marital status: Married    Spouse name: Not on file  . Number of children: 0  . Years of education: Not on file  . Highest education level: Not on file  Occupational History  . Occupation: Scientist, research (medical)CNA    Employer: [employer]  Social Needs  . Financial resource strain: Not on file  . Food insecurity:  Worry: Not on file    Inability: Not on file  . Transportation needs:    Medical: Not on file    Non-medical: Not on file  Tobacco Use  . Smoking status: Never Smoker  . Smokeless tobacco: Never Used  Substance and Sexual Activity  . Alcohol use: No  . Drug use: No  . Sexual activity: Not on file  Lifestyle  . Physical activity:    Days per week: Not on file    Minutes per session: Not on file  . Stress: Not on file  Relationships  . Social connections:    Talks on phone: Not on file    Gets together: Not on file    Attends religious service: Not on file    Active member of club or organization: Not on file    Attends meetings of clubs or organizations: Not on file    Relationship status: Not on file  Other Topics Concern  . Not on file  Social History Narrative  . Not on file     Family History: The  patient's family history includes Cervical cancer in her mother; Diabetes in her sister; Hypertension in her sister. There is no history of Heart disease, Cancer, Alcohol abuse, Early death, Hearing loss, Hyperlipidemia, Kidney disease, or Stroke.  ROS:   Please see the history of present illness.     All other systems reviewed and are negative.  EKGs/Labs/Other Studies Reviewed:    The following studies were reviewed today:   EKG:     Recent Labs: 04/27/2017: ALT 8; TSH 1.71 05/10/2017: BUN 11; Creatinine, Ser 0.55; Potassium 3.5; Sodium 141 05/18/2017: Hemoglobin 13.9; Platelets 408.0  Recent Lipid Panel    Component Value Date/Time   CHOL 214 (H) 06/22/2016 1022   TRIG 119.0 06/22/2016 1022   HDL 49.00 06/22/2016 1022   CHOLHDL 4 06/22/2016 1022   VLDL 23.8 06/22/2016 1022   LDLCALC 141 (H) 06/22/2016 1022    Physical Exam:    VS:  BP 128/84   Pulse 72   Ht 5\' 5"  (1.651 m)   Wt 294 lb 1.9 oz (133.4 kg)   SpO2 98%   BMI 48.94 kg/m     Wt Readings from Last 3 Encounters:  06/09/17 294 lb 1.9 oz (133.4 kg)  05/18/17 289 lb 0.2 oz (131.1 kg)  05/10/17 294 lb (133.4 kg)     GEN:   Very pleasant, morbidly obese middle age female  HEENT: Normal NECK: No JVD; No carotid bruits LYMPHATICS: No lymphadenopathy CARDIAC:  RR , no irregularities, no murmurs  RESPIRATORY:  Clear to auscultation without rales, wheezing or rhonchi  ABDOMEN: Soft, non-tender, non-distended MUSCULOSKELETAL:  No edema; No deformity  SKIN: Warm and dry NEUROLOGIC:  Alert and oriented x 3 PSYCHIATRIC:  Normal affect   ASSESSMENT:    No diagnosis found. PLAN:    In order of problems listed above:  1.  Chest pain: The patient's pains are much better on the Dexilant.  She had not been sleeping well.  She was trying to work third shift at her job and also take care of her sister who is having some problems.  Now that she is been able to sleep her symptoms have clearly resolved.  She is also had  occasional palpitations but these also seem to be getting better.  At this point I do not think that she needs any additional testing.  Her symptoms seem to be resolving on their own.  I have encouraged  her to continue to work on a good diet, exercise, weight loss program.   Medication Adjustments/Labs and Tests Ordered: Current medicines are reviewed at length with the patient today.  Concerns regarding medicines are outlined above.  No orders of the defined types were placed in this encounter.  No orders of the defined types were placed in this encounter.   Signed, Kristeen Miss, MD  06/09/2017 11:39 AM    [facility]

## 2017-07-01 ENCOUNTER — Other Ambulatory Visit: Payer: Self-pay | Admitting: Internal Medicine

## 2017-07-01 DIAGNOSIS — M545 Low back pain: Secondary | ICD-10-CM

## 2017-07-01 DIAGNOSIS — M79604 Pain in right leg: Secondary | ICD-10-CM

## 2017-07-22 ENCOUNTER — Ambulatory Visit: Payer: BLUE CROSS/BLUE SHIELD | Admitting: Internal Medicine

## 2017-07-27 ENCOUNTER — Encounter: Payer: Self-pay | Admitting: Internal Medicine

## 2017-07-27 ENCOUNTER — Encounter (HOSPITAL_COMMUNITY): Payer: Self-pay | Admitting: Emergency Medicine

## 2017-07-27 ENCOUNTER — Emergency Department (HOSPITAL_COMMUNITY)
Admission: EM | Admit: 2017-07-27 | Discharge: 2017-07-27 | Disposition: A | Discharge status: Home / Self Care | Payer: BLUE CROSS/BLUE SHIELD | Attending: Emergency Medicine | Admitting: Emergency Medicine

## 2017-07-27 DIAGNOSIS — Z79899 Other long term (current) drug therapy: Secondary | ICD-10-CM | POA: Diagnosis not present

## 2017-07-27 DIAGNOSIS — I10 Essential (primary) hypertension: Secondary | ICD-10-CM | POA: Diagnosis not present

## 2017-07-27 MED ORDER — HYDROCHLOROTHIAZIDE 12.5 MG PO CAPS: 12.5000 mg | ORAL_CAPSULE | Freq: Every day | ORAL | 0 refills | Status: DC

## 2017-07-27 NOTE — ED Provider Notes (Signed)
[facility]-EMERGENCY DEPT Provider Note   CSN: [id number] Arrival date & time: 07/27/17  0736     History   Chief Complaint Chief Complaint  Patient presents with  . Hypertension    HPI Cayley Pester is a 52 y.o. female.  Complaint is "my blood pressure keeps getting high  HPI 52 year old female.  History of hypertension.  Was previously on metoprolol.  Her doctor changed her to [facility] because she was having difficulty with high readings.  She is been under good control after getting "my vitamin D and iron levels fixed".  However she has had several high readings over the last several days.  She states she feels funny in her head.  Her pressure was 157/120 this morning.  A friend at work gave her a 40 mg Lasix.  Blood pressure has improved upon arrival here and she has diuresed markedly.  She is otherwise asymptomatic  Past Medical History:  Diagnosis Date  . Arthritis   . Esophageal stenosis   . Esophagitis   . GERD (gastroesophageal reflux disease)   . Hiatal hernia   . Hypertension   . Iron deficiency anemia   . Migraines   . Morbid obesity (HCC)   . Sleep apnea         Patient Active Problem List   Diagnosis Date Noted  . Iron deficiency anemia due to chronic blood loss 05/18/2017  . Intractable episodic headache 04/27/2017  . Ataxia 04/27/2017  . Vitamin D deficiency 04/27/2017  . HLD (hyperlipidemia) 06/27/2016  . Gastroesophageal reflux disease with esophagitis 12/20/2014  . Fatty liver disease, nonalcoholic 12/11/2014  . Obstructive sleep apnea 09/28/2013  . Morbid obesity with BMI of 50.0-59.9, adult (HCC) 06/28/2013  . Esophageal dysmotility 06/28/2013  . Other abnormal glucose 06/28/2013  . Leukocytosis 06/28/2013  . Low back pain radiating to both legs 06/28/2013  . Hip pain, bilateral 06/28/2013  . Deficiency anemia 06/30/2012  . Essential hypertension, benign 11/04/2011  . Routine general medical examination at a health  care facility 11/04/2011    Past Surgical History:  Procedure Laterality Date  . ABDOMINAL HYSTERECTOMY    . ABDOMINAL HYSTERECTOMY  2003  . CARDIOVASCULAR STRESS TEST  07/25/2007   Negative stress echo; ef 55-60%  . ESOPHAGEAL MANOMETRY  03/07/2012   Procedure: ESOPHAGEAL MANOMETRY (EM);  Surgeon: Mardella Layman, MD;  Location: [facility];  Service: Endoscopy;  Laterality: N/A;  . ESOPHAGOGASTRODUODENOSCOPY    . KNEE ARTHROSCOPY  01/2011   left  . KNEE SURGERY       OB History   None      Home Medications    Prior to Admission medications   Medication Sig Start Date End Date Taking? Authorizing Provider  acetaminophen (TYLENOL) 500 MG tablet Take 1 tablet (500 mg total) by mouth every 8 (eight) hours as needed. For pain 12/13/14   Albertine Grates, MD  Cholecalciferol 2000 units TABS Take 1 tablet (2,000 Units total) by mouth daily. 04/27/17   [name], MD  dexlansoprazole (DEXILANT) 60 MG capsule Take 1 capsule (60 mg total) by mouth daily. 05/18/17   [name], MD  ferrous sulfate 325 (65 FE) MG tablet Take 1 tablet (325 mg total) by mouth 2 (two) times daily with a meal. 05/18/17   [name], MD  hydrochlorothiazide (MICROZIDE) 12.5 MG capsule Take 1 capsule (12.5 mg total) by mouth daily. 07/27/17   Rolland Porter, MD  methocarbamol (ROBAXIN) 750 MG tablet TAKE 1 TABLET(750 MG)  BY MOUTH EVERY 8 HOURS AS NEEDED FOR MUSCLE SPASMS 07/01/17   [name], MD  nebivolol (BYSTOLIC) 10 MG tablet Take 1 tablet (10 mg total) by mouth daily. 05/18/17   [name], MD  traMADol (ULTRAM) 50 MG tablet Take 1 tablet (50 mg total) by mouth every 6 (six) hours as needed. 04/28/17   [name], MD    Family History Family History  Problem Relation Age of Onset  . Cervical cancer Mother   . Hypertension Sister   . Diabetes Sister   . Heart disease Neg Hx   . Cancer Neg Hx   . Alcohol abuse Neg Hx   . Early death Neg Hx   . Hearing loss Neg Hx   . Hyperlipidemia  Neg Hx   . Kidney disease Neg Hx   . Stroke Neg Hx     Social History Social History   Tobacco Use  . Smoking status: Never Smoker  . Smokeless tobacco: Never Used  Substance Use Topics  . Alcohol use: No  . Drug use: No     Allergies   Oxycodone and Pneumococcal vaccines   Review of Systems Review of Systems  Constitutional: Negative for appetite change, chills, diaphoresis, fatigue and fever.  HENT: Negative for mouth sores, sore throat and trouble swallowing.   Eyes: Negative for visual disturbance.  Respiratory: Negative for cough, chest tightness, shortness of breath and wheezing.   Cardiovascular: Negative for chest pain.  Gastrointestinal: Negative for abdominal distention, abdominal pain, diarrhea, nausea and vomiting.  Endocrine: Negative for polydipsia, polyphagia and polyuria.  Genitourinary: Negative for dysuria, frequency and hematuria.  Musculoskeletal: Negative for gait problem.  Skin: Negative for color change, pallor and rash.  Neurological: Positive for headaches. Negative for dizziness, syncope and light-headedness.  Hematological: Does not bruise/bleed easily.  Psychiatric/Behavioral: Negative for behavioral problems and confusion.     Physical Exam Updated Vital Signs BP (!) 127/95 (BP Location: Left Arm)   Pulse 69   Temp 98 F (36.7 C) (Oral)   Resp 20   SpO2 98%   Physical Exam  Constitutional: She is oriented to person, place, and time. She appears well-developed and well-nourished. No distress.  HENT:  Head: Normocephalic.  Eyes: Pupils are equal, round, and reactive to light. Conjunctivae are normal. No scleral icterus.  Neck: Normal range of motion. Neck supple. No thyromegaly present.  Cardiovascular: Normal rate and regular rhythm. Exam reveals no gallop and no friction rub.  No murmur heard. Regular.  Not tachycardic.  No dependent edema.  Pulmonary/Chest: Effort normal and breath sounds normal. No respiratory distress. She has  no wheezes. She has no rales.  Abdominal: Soft. Bowel sounds are normal. She exhibits no distension. There is no tenderness. There is no rebound.  Musculoskeletal: Normal range of motion.  Neurological: She is alert and oriented to person, place, and time.  Skin: Skin is warm and dry. No rash noted.  Psychiatric: She has a normal mood and affect. Her behavior is normal.     ED Treatments / Results  Labs (all labs ordered are listed, but only abnormal results are displayed) Labs Reviewed - No data to display  EKG None  Radiology No results found.  Procedures Procedures (including critical care time)  Medications Ordered in ED Medications - No data to display   Initial Impression / Assessment and Plan / ED Course  I have reviewed the triage vital signs and the nursing notes.  Pertinent labs & imaging results  that were available during my care of the patient were reviewed by me and considered in my medical decision making (see chart for details).    Will add 12.5 HCTZ daily.  Check and record blood pressures twice daily.  Primary care follow-up within the next 7 to 10 days.  Final Clinical Impressions(s) / ED Diagnoses   Final diagnoses:  Hypertension, unspecified type    ED Discharge Orders        Ordered    hydrochlorothiazide (MICROZIDE) 12.5 MG capsule  Daily     07/27/17 0904       Rolland Porter, MD 07/27/17 [phone]

## 2017-07-27 NOTE — ED Triage Notes (Signed)
Patient here from home with complaints of hypertension. Hx of same. Takes bystolic prescribed by PCP.

## 2017-07-27 NOTE — Discharge Instructions (Addendum)
HCTZ once per day. Continue your by systolic Check and record your blood pressure before and after work and take these readings to your next office appointment.

## 2017-07-28 ENCOUNTER — Ambulatory Visit: Payer: BLUE CROSS/BLUE SHIELD | Admitting: Family

## 2017-07-28 ENCOUNTER — Encounter: Payer: Self-pay | Admitting: Family

## 2017-07-28 DIAGNOSIS — I10 Essential (primary) hypertension: Secondary | ICD-10-CM | POA: Diagnosis not present

## 2017-07-28 MED ORDER — CHLORTHALIDONE 25 MG PO TABS: 25.0000 mg | ORAL_TABLET | Freq: Every day | ORAL | 0 refills | Status: DC

## 2017-07-28 NOTE — Progress Notes (Signed)
[name] is a 52 y.o. female with the following history as recorded in [employer]:  Patient Active Problem List   Diagnosis Date Noted  . Iron deficiency anemia due to chronic blood loss 05/18/2017  . Intractable episodic headache 04/27/2017  . Ataxia 04/27/2017  . Vitamin D deficiency 04/27/2017  . HLD (hyperlipidemia) 06/27/2016  . Gastroesophageal reflux disease with esophagitis 12/20/2014  . Fatty liver disease, nonalcoholic 12/11/2014  . Obstructive sleep apnea 09/28/2013  . Morbid obesity with BMI of 50.0-59.9, adult (HCC) 06/28/2013  . Esophageal dysmotility 06/28/2013  . Other abnormal glucose 06/28/2013  . Leukocytosis 06/28/2013  . Low back pain radiating to both legs 06/28/2013  . Hip pain, bilateral 06/28/2013  . Deficiency anemia 06/30/2012  . Essential hypertension, benign 11/04/2011  . Routine general medical examination at a health care facility 11/04/2011    Current Outpatient Medications  Medication Sig Dispense Refill  . acetaminophen (TYLENOL) 500 MG tablet Take 1 tablet (500 mg total) by mouth every 8 (eight) hours as needed. For pain 30 tablet 0  . Calcium Carb-Cholecalciferol (CALTRATE 600+D3 SOFT) 600-800 MG-UNIT CHEW Chew by mouth.    . Cholecalciferol 2000 units TABS Take 1 tablet (2,000 Units total) by mouth daily. 90 tablet 1  . dexlansoprazole (DEXILANT) 60 MG capsule Take 1 capsule (60 mg total) by mouth daily. 90 capsule 1  . ferrous sulfate 325 (65 FE) MG tablet Take 1 tablet (325 mg total) by mouth 2 (two) times daily with a meal. 180 tablet 1  . methocarbamol (ROBAXIN) 750 MG tablet TAKE 1 TABLET(750 MG) BY MOUTH EVERY 8 HOURS AS NEEDED FOR MUSCLE SPASMS 90 tablet 2  . nebivolol (BYSTOLIC) 10 MG tablet Take 1 tablet (10 mg total) by mouth daily. 90 tablet 1  . traMADol (ULTRAM) 50 MG tablet Take 1 tablet (50 mg total) by mouth every 6 (six) hours as needed. 65 tablet 3  . chlorthalidone (HYGROTON) 25 MG tablet Take 1 tablet (25 mg total) by  mouth daily. 90 tablet 0   No current facility-administered medications for this visit.     Allergies: Oxycodone and Pneumococcal vaccines  Past Medical History:  Diagnosis Date  . Arthritis   . Esophageal stenosis   . Esophagitis   . GERD (gastroesophageal reflux disease)   . Hiatal hernia   . Hypertension   . Iron deficiency anemia   . Migraines   . Morbid obesity (HCC)   . Sleep apnea         Past Surgical History:  Procedure Laterality Date  . ABDOMINAL HYSTERECTOMY    . ABDOMINAL HYSTERECTOMY  2003  . CARDIOVASCULAR STRESS TEST  07/25/2007   Negative stress echo; ef 55-60%  . ESOPHAGEAL MANOMETRY  03/07/2012   Procedure: ESOPHAGEAL MANOMETRY (EM);  Surgeon: Mardella Layman, MD;  Location: [facility];  Service: Endoscopy;  Laterality: N/A;  . ESOPHAGOGASTRODUODENOSCOPY    . KNEE ARTHROSCOPY  01/2011   left  . KNEE SURGERY      Family History  Problem Relation Age of Onset  . Cervical cancer Mother   . Hypertension Sister   . Diabetes Sister   . Heart disease Neg Hx   . Cancer Neg Hx   . Alcohol abuse Neg Hx   . Early death Neg Hx   . Hearing loss Neg Hx   . Hyperlipidemia Neg Hx   . Kidney disease Neg Hx   . Stroke Neg Hx     Social History   Tobacco Use  .  Smoking status: Never Smoker  . Smokeless tobacco: Never Used  Substance Use Topics  . Alcohol use: No    Subjective:  Patient has been having concerns about her blood pressure being elevated in the past few days. Noted that over the past few days, pressure "just seemed to be going up." She readily admits that she had not been taking her fluid pill regularly- only her Bystolic. Opted to go to the ER last night with concerns- checked in with pressure of 159/120; she had taken a friend's Lasix 40 mg prior to go to ER and pressure began to normalized while at the ER. Was discharged from the ER with no changes noted- told to take both of her Bystolic and HCTZ; patient became concerned when her pressure  became very high again this morning; Denies any chest pain, shortness of breath, blurred vision or headache.    Objective:  Vitals:   07/28/17 1054  BP: 136/88  Pulse: 73  Temp: 98.4 F (36.9 C)  TempSrc: Oral  SpO2: 96%  Weight: 288 lb 0.6 oz (130.7 kg)  Height:  (1.651 m)    General: Well developed, well nourished, in no acute distress  Skin : Warm and dry.  Head: Normocephalic and atraumatic  Lungs: Respirations unlabored; clear to auscultation bilaterally without wheeze, rales, rhonchi  CVS exam: normal rate and regular rhythm.  Neurologic: Alert and oriented; speech intact; face symmetrical; moves all extremities well; CNII-XII intact without focal deficit   Assessment:  1. Essential hypertension, benign     Plan:  In reviewing notes, patient is supposed to be on both Chlorthalidone 25 mg and Bystolic 10 mg; she agrees to take both as prescribed; d/c HCTZ given at ER yesterday; follow-up with her PCP in 2 weeks, sooner prn.   Return in about 2 weeks (around 08/11/2017) for with Dr. Yetta Barre.  No orders of the defined types were placed in this encounter.   Requested Prescriptions   Signed Prescriptions Disp Refills  . chlorthalidone (HYGROTON) 25 MG tablet 90 tablet 0    Sig: Take 1 tablet (25 mg total) by mouth daily.

## 2017-07-30 NOTE — Telephone Encounter (Signed)
Patient was seen by Ria Clock on 07/28/2017

## 2017-08-07 ENCOUNTER — Other Ambulatory Visit: Payer: Self-pay | Admitting: Internal Medicine

## 2017-08-07 DIAGNOSIS — K2100 Gastro-esophageal reflux disease with esophagitis, without bleeding: Secondary | ICD-10-CM

## 2017-08-18 ENCOUNTER — Other Ambulatory Visit (INDEPENDENT_AMBULATORY_CARE_PROVIDER_SITE_OTHER): Payer: BLUE CROSS/BLUE SHIELD

## 2017-08-18 ENCOUNTER — Encounter: Payer: Self-pay | Admitting: Internal Medicine

## 2017-08-18 ENCOUNTER — Ambulatory Visit: Payer: BLUE CROSS/BLUE SHIELD | Admitting: Internal Medicine

## 2017-08-18 VITALS — BP 122/80 | HR 64 | Temp 98.3°F | Resp 16 | Ht 65.0 in | Wt 288.2 lb

## 2017-08-18 DIAGNOSIS — G43909 Migraine, unspecified, not intractable, without status migrainosus: Secondary | ICD-10-CM | POA: Insufficient documentation

## 2017-08-18 DIAGNOSIS — E7800 Pure hypercholesterolemia, unspecified: Secondary | ICD-10-CM | POA: Diagnosis not present

## 2017-08-18 DIAGNOSIS — I10 Essential (primary) hypertension: Secondary | ICD-10-CM | POA: Diagnosis not present

## 2017-08-18 DIAGNOSIS — Z0000 Encounter for general adult medical examination without abnormal findings: Secondary | ICD-10-CM

## 2017-08-18 DIAGNOSIS — K760 Fatty (change of) liver, not elsewhere classified: Secondary | ICD-10-CM

## 2017-08-18 DIAGNOSIS — R7303 Prediabetes: Secondary | ICD-10-CM

## 2017-08-18 LAB — CBC WITH DIFFERENTIAL/PLATELET
BASOS PCT: 0.8 % (ref 0.0–3.0)
Basophils Absolute: 0.1 10*3/uL (ref 0.0–0.1)
EOS ABS: 0.5 10*3/uL (ref 0.0–0.7)
Eosinophils Relative: 4.9 % (ref 0.0–5.0)
HCT: 38.6 % (ref 36.0–46.0)
Hemoglobin: 12.9 g/dL (ref 12.0–15.0)
LYMPHS ABS: 4.2 10*3/uL — AB (ref 0.7–4.0)
Lymphocytes Relative: 39.6 % (ref 12.0–46.0)
MCHC: 33.6 g/dL (ref 30.0–36.0)
MCV: 83.8 fl (ref 78.0–100.0)
MONO ABS: 0.9 10*3/uL (ref 0.1–1.0)
Monocytes Relative: 8.9 % (ref 3.0–12.0)
NEUTROS ABS: 4.9 10*3/uL (ref 1.4–7.7)
Neutrophils Relative %: 45.8 % (ref 43.0–77.0)
PLATELETS: 291 10*3/uL (ref 150.0–400.0)
RBC: 4.6 Mil/uL (ref 3.87–5.11)
RDW: 14.2 % (ref 11.5–15.5)
WBC: 10.7 10*3/uL — ABNORMAL HIGH (ref 4.0–10.5)

## 2017-08-18 LAB — COMPREHENSIVE METABOLIC PANEL WITH GFR
ALT: 11 U/L (ref 0–35)
AST: 18 U/L (ref 0–37)
Albumin: 4.1 g/dL (ref 3.5–5.2)
Alkaline Phosphatase: 75 U/L (ref 39–117)
BUN: 13 mg/dL (ref 6–23)
CO2: 29 meq/L (ref 19–32)
Calcium: 9.6 mg/dL (ref 8.4–10.5)
Chloride: 99 meq/L (ref 96–112)
Creatinine, Ser: 0.74 mg/dL (ref 0.40–1.20)
GFR: 105.54 mL/min (ref >60.00)
Glucose, Bld: 96 mg/dL (ref 70–99)
Potassium: 3.8 meq/L (ref 3.5–5.1)
Sodium: 137 meq/L (ref 135–145)
Total Bilirubin: 0.4 mg/dL (ref 0.2–1.2)
Total Protein: 7.7 g/dL (ref 6.0–8.3)

## 2017-08-18 LAB — LIPID PANEL
Cholesterol: 194 mg/dL (ref 0–200)
HDL: 45.7 mg/dL (ref >39.00)
LDL Cholesterol: 130 mg/dL — ABNORMAL HIGH (ref 0–99)
NonHDL: 148.04
Total CHOL/HDL Ratio: 4
Triglycerides: 92 mg/dL (ref 0.0–149.0)
VLDL: 18.4 mg/dL (ref 0.0–40.0)

## 2017-08-18 NOTE — Patient Instructions (Signed)

## 2017-08-18 NOTE — Progress Notes (Signed)
Subjective:  Patient ID: [name], female    DOB: 02/08/1966  Age: 52 y.o. MRN: [id number]  CC: Annual Exam and Hypertension   HPI [name] presents for a CPX.   She tells me her blood pressure has recently been well controlled.  She denies any recent episodes of headache, blurred vision, chest pain, shortness of breath, DOE, edema, or fatigue.  She has lost 14 pounds over the last 2 months with lifestyle modifications.  Outpatient Medications Prior to Visit  Medication Sig Dispense Refill  . acetaminophen (TYLENOL) 500 MG tablet Take 1 tablet (500 mg total) by mouth every 8 (eight) hours as needed. For pain 30 tablet 0  . Calcium Carb-Cholecalciferol (CALTRATE 600+D3 SOFT) 600-800 MG-UNIT CHEW Chew by mouth.    . chlorthalidone (HYGROTON) 25 MG tablet Take 1 tablet (25 mg total) by mouth daily. 90 tablet 0  . Cholecalciferol 2000 units TABS Take 1 tablet (2,000 Units total) by mouth daily. 90 tablet 1  . dexlansoprazole (DEXILANT) 60 MG capsule Take 1 capsule (60 mg total) by mouth daily. 90 capsule 1  . ferrous sulfate 325 (65 FE) MG tablet Take 1 tablet (325 mg total) by mouth 2 (two) times daily with a meal. 180 tablet 1  . methocarbamol (ROBAXIN) 750 MG tablet TAKE 1 TABLET(750 MG) BY MOUTH EVERY 8 HOURS AS NEEDED FOR MUSCLE SPASMS 90 tablet 2  . nebivolol (BYSTOLIC) 10 MG tablet Take 1 tablet (10 mg total) by mouth daily. 90 tablet 1  . traMADol (ULTRAM) 50 MG tablet Take 1 tablet (50 mg total) by mouth every 6 (six) hours as needed. 65 tablet 3  . pantoprazole (PROTONIX) 40 MG tablet TAKE 1 TABLET(40 MG) BY MOUTH DAILY 90 tablet 1   No facility-administered medications prior to visit.     ROS Review of Systems  Constitutional: Negative for diaphoresis, fatigue and unexpected weight change.  HENT: Negative.  Negative for trouble swallowing.   Eyes: Negative.  Negative for visual disturbance.  Respiratory: Negative.  Negative for apnea, cough, chest tightness  and shortness of breath.   Cardiovascular: Negative for chest pain, palpitations and leg swelling.  Gastrointestinal: Negative.  Negative for abdominal pain, constipation, diarrhea, nausea and vomiting.  Genitourinary: Negative.  Negative for difficulty urinating, dysuria, frequency, hematuria and urgency.  Musculoskeletal: Negative.   Skin: Negative.   Neurological: Negative.  Negative for dizziness, weakness, light-headedness and headaches.  Hematological: Negative for adenopathy. Does not bruise/bleed easily.  Psychiatric/Behavioral: Negative.     Objective:  BP 122/80 (BP Location: Left Arm, Patient Position: Sitting, Cuff Size: Large)   Pulse 64   Temp 98.3 F (36.8 C) (Oral)   Resp 16   Ht 5\' 5"  (1.651 m)   Wt 288 lb 4 oz (130.7 kg)   SpO2 99%   BMI 47.97 kg/m   BP Readings from Last 3 Encounters:  08/18/17 122/80  07/28/17 136/88  07/27/17 (!) 127/95    Wt Readings from Last 3 Encounters:  08/18/17 288 lb 4 oz (130.7 kg)  07/28/17 288 lb 0.6 oz (130.7 kg)  06/09/17 294 lb 1.9 oz (133.4 kg)    Physical Exam  Constitutional: She is oriented to person, place, and time. No distress.  HENT:  Mouth/Throat: Oropharynx is clear and moist. No oropharyngeal exudate.  Eyes: Conjunctivae are normal. No scleral icterus.  Neck: Normal range of motion. Neck supple. No JVD present. No thyromegaly present.  Cardiovascular: Normal rate, regular rhythm and normal heart sounds. Exam reveals no gallop.  No murmur heard. Pulmonary/Chest: Effort normal and breath sounds normal. No respiratory distress. She has no wheezes. She has no rales.  Abdominal: Soft. Normal appearance and bowel sounds are normal. She exhibits no mass. There is no hepatosplenomegaly. There is no tenderness.  Musculoskeletal: Normal range of motion. She exhibits no edema, tenderness or deformity.  Lymphadenopathy:    She has no cervical adenopathy.  Neurological: She is alert and oriented to person, place, and  time.  Skin: Skin is warm and dry. She is not diaphoretic. No pallor.  Vitals reviewed.   Lab Results  Component Value Date   WBC 10.7 (H) 08/18/2017   HGB 12.9 08/18/2017   HCT 38.6 08/18/2017   PLT 291.0 08/18/2017   GLUCOSE 96 08/18/2017   CHOL 194 08/18/2017   TRIG 92.0 08/18/2017   HDL 45.70 08/18/2017   LDLCALC 130 (H) 08/18/2017   ALT 11 08/18/2017   AST 18 08/18/2017   NA 137 08/18/2017   K 3.8 08/18/2017   CL 99 08/18/2017   CREATININE 0.74 08/18/2017   BUN 13 08/18/2017   CO2 29 08/18/2017   TSH 1.71 04/27/2017   INR 1.0 07/06/2007   HGBA1C 6.0 04/27/2017    No results found.  Assessment & Plan:   [name] was seen today for annual exam and hypertension.  Diagnoses and all orders for this visit:  Prediabetes- Her recent A1c was 6.0%.  Medical therapy is not indicated.  She was praised for her lifestyle modifications. -     Comprehensive metabolic panel; Future  Routine general medical examination at a health care facility- Exam completed, labs reviewed, vaccines reviewed, screening for colon cancer is up-to-date, her Pap and mammogram are up-to-date.  Patient education material was given. -     Lipid panel; Future  Essential hypertension, benign- Her blood pressure is adequately well controlled.  Electrolytes and renal function are normal. -     CBC with Differential/Platelet; Future -     Comprehensive metabolic panel; Future  Fatty liver disease, nonalcoholic- Her LFTs are normal now.  She was encouraged to continue with her lifestyle modifications. -     Comprehensive metabolic panel; Future  Pure hypercholesterolemia- She does not have an elevated ASCVD risk score so I do not recommend a statin at this time.   I have discontinued Will Conway's pantoprazole. I am also having her maintain her acetaminophen, Cholecalciferol, traMADol, dexlansoprazole, nebivolol, ferrous sulfate, methocarbamol, Calcium Carb-Cholecalciferol, and  chlorthalidone.  No orders of the defined types were placed in this encounter.    Follow-up: Return in about 6 months (around 02/17/2018).  Sanda Linger, MD

## 2017-09-23 ENCOUNTER — Encounter: Payer: Self-pay | Admitting: Internal Medicine

## 2017-09-23 ENCOUNTER — Encounter (INDEPENDENT_AMBULATORY_CARE_PROVIDER_SITE_OTHER): Payer: Self-pay

## 2017-09-23 ENCOUNTER — Ambulatory Visit: Payer: BLUE CROSS/BLUE SHIELD | Admitting: Internal Medicine

## 2017-09-23 ENCOUNTER — Encounter

## 2017-09-23 VITALS — BP 144/100 | HR 78 | Ht 65.0 in | Wt 289.2 lb

## 2017-09-23 DIAGNOSIS — K224 Dyskinesia of esophagus: Secondary | ICD-10-CM | POA: Diagnosis not present

## 2017-09-23 DIAGNOSIS — D638 Anemia in other chronic diseases classified elsewhere: Secondary | ICD-10-CM | POA: Diagnosis not present

## 2017-09-23 DIAGNOSIS — R0789 Other chest pain: Secondary | ICD-10-CM | POA: Diagnosis not present

## 2017-09-23 DIAGNOSIS — Q396 Congenital diverticulum of esophagus: Secondary | ICD-10-CM | POA: Diagnosis not present

## 2017-09-23 NOTE — Progress Notes (Signed)
[name] 52 y.o. 1966/01/17 [id number]  Assessment & Plan:   Encounter Diagnoses  Name Primary?  . Esophageal dysmotility Yes  . Esophageal diverticulum   . Other chest pain   . Anemia of chronic disease     Repeat manometry will be offered ? If Botox or pneumatic dilation needed Dr. Lavon Paganini thinks EGD guided mano best given diverticulum Other option would be tx w/ TCA or Trazodone  Will discuss  I left her a message about this 5:38 PM 09/30/2017  I appreciate the opportunity to care for this patient. WG:NFAOZ, Bernadene Bell, MD    Subjective:   Chief Complaint: chest pain  HPI Has hx dysmotility and distal esophageal diverticulum - still gets episodic chest pain despite PPI and is now on Dexilant. Dysphagia seems gone after dilation of GE junction to 20 mm last year.  Manometry in past....see in procedures secttion 03/11/2012 had incomplete bolus clearance with high lower esophageal sphincter pressure and a nonspecific esophageal motility disorder.    Also some concern about anemia - ? Iron deficiency  Lab Results  Component Value Date   IRON 23 (L) 05/18/2017   FERRITIN 160.5 05/18/2017   Lab Results  Component Value Date   WBC 10.7 (H) 08/18/2017   HGB 12.9 08/18/2017   HCT 38.6 08/18/2017   MCV 83.8 08/18/2017   PLT 291.0 08/18/2017     Allergies  Allergen Reactions  . Oxycodone Shortness Of Breath  . Pneumococcal Vaccines Itching and Swelling    Swelling caused by prevnar (06/22/2016).   Current Meds  Medication Sig  . acetaminophen (TYLENOL) 500 MG tablet Take 1 tablet (500 mg total) by mouth every 8 (eight) hours as needed. For pain  . Calcium Carb-Cholecalciferol (CALTRATE 600+D3 SOFT) 600-800 MG-UNIT CHEW Chew by mouth.  . chlorthalidone (HYGROTON) 25 MG tablet Take 1 tablet (25 mg total) by mouth daily.  . Cholecalciferol 2000 units TABS Take 1 tablet (2,000 Units total) by mouth daily.  Marland Kitchen dexlansoprazole (DEXILANT) 60 MG capsule  Take 1 capsule (60 mg total) by mouth daily.  . ferrous sulfate 325 (65 FE) MG tablet Take 1 tablet (325 mg total) by mouth 2 (two) times daily with a meal.  . methocarbamol (ROBAXIN) 750 MG tablet TAKE 1 TABLET(750 MG) BY MOUTH EVERY 8 HOURS AS NEEDED FOR MUSCLE SPASMS  . nebivolol (BYSTOLIC) 10 MG tablet Take 1 tablet (10 mg total) by mouth daily.  . traMADol (ULTRAM) 50 MG tablet Take 1 tablet (50 mg total) by mouth every 6 (six) hours as needed.   Past Medical History:  Diagnosis Date  . Arthritis   . Esophageal stenosis   . Esophagitis   . GERD (gastroesophageal reflux disease)   . Hiatal hernia   . Hypertension   . Iron deficiency anemia   . Migraines   . Morbid obesity (HCC)   . Sleep apnea        Past Surgical History:  Procedure Laterality Date  . ABDOMINAL HYSTERECTOMY    . ABDOMINAL HYSTERECTOMY  2003  . CARDIOVASCULAR STRESS TEST  07/25/2007   Negative stress echo; ef 55-60%  . ESOPHAGEAL MANOMETRY  03/07/2012   Procedure: ESOPHAGEAL MANOMETRY (EM);  Surgeon: Mardella Layman, MD;  Location: [facility];  Service: Endoscopy;  Laterality: N/A;  . ESOPHAGOGASTRODUODENOSCOPY    . KNEE ARTHROSCOPY  01/2011   left  . 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   Review of Systems As per HPI  Objective:   Physical Exam BP (!) 144/100   Pulse 78   Ht 5\' 5"  (1.651 m)   Wt 289 lb 4 oz (131.2 kg)   BMI 48.13 kg/m  NAD   15 minutes time spent with patient > half in counseling coordination of care

## 2017-09-23 NOTE — Patient Instructions (Signed)
   Dr Leone PayorGessner is going to discuss your care with Dr Lavon PaganiniNandigam and we will be in touch.    I appreciate the opportunity to care for you. Stan Headarl Gessner, MD, [facility]

## 2017-09-24 ENCOUNTER — Encounter: Payer: Self-pay | Admitting: Internal Medicine

## 2017-12-08 ENCOUNTER — Other Ambulatory Visit: Payer: Self-pay | Admitting: Internal Medicine

## 2017-12-08 DIAGNOSIS — M545 Low back pain, unspecified: Secondary | ICD-10-CM

## 2017-12-08 DIAGNOSIS — M25551 Pain in right hip: Secondary | ICD-10-CM

## 2017-12-08 DIAGNOSIS — M25552 Pain in left hip: Secondary | ICD-10-CM

## 2017-12-08 DIAGNOSIS — M79605 Pain in left leg: Secondary | ICD-10-CM

## 2017-12-08 DIAGNOSIS — M79604 Pain in right leg: Secondary | ICD-10-CM

## 2018-01-19 LAB — HM MAMMOGRAPHY: HM Mammogram: NORMAL (ref 0–4)

## 2018-01-27 ENCOUNTER — Other Ambulatory Visit: Payer: Self-pay | Admitting: Internal Medicine

## 2018-01-27 DIAGNOSIS — I10 Essential (primary) hypertension: Secondary | ICD-10-CM

## 2018-03-03 ENCOUNTER — Encounter: Payer: Self-pay | Admitting: Internal Medicine

## 2018-03-03 ENCOUNTER — Ambulatory Visit: Payer: PRIVATE HEALTH INSURANCE | Admitting: Internal Medicine

## 2018-03-03 VITALS — BP 132/82 | HR 74 | Temp 97.9°F | Ht 65.0 in | Wt 278.0 lb

## 2018-03-03 DIAGNOSIS — M545 Low back pain: Secondary | ICD-10-CM

## 2018-03-03 DIAGNOSIS — M79605 Pain in left leg: Secondary | ICD-10-CM

## 2018-03-03 DIAGNOSIS — M79604 Pain in right leg: Secondary | ICD-10-CM

## 2018-03-03 MED ORDER — KETOROLAC TROMETHAMINE 30 MG/ML IJ SOLN
30.0000 mg | Freq: Once | INTRAMUSCULAR | Status: AC
  Administered 2018-03-03: 30 mg via INTRAMUSCULAR

## 2018-03-03 NOTE — Patient Instructions (Signed)
We have given you a toradol shot today and you can use the heating pad, tylenol, advil, and the muscle relaxer for the pain.   You should be better in 1-2 weeks.

## 2018-03-03 NOTE — Assessment & Plan Note (Signed)
Toradol 30 mg IM given at visit. She will continue to use advil, robaxin, tylenol and heat at home for pain. Call back if not improved in 1-2 weeks. Work note given to be out until 03/10/18 since she doing lifting at job.

## 2018-03-03 NOTE — Progress Notes (Signed)
   Subjective:   Patient ID: [name], female    DOB: 04/29/1965, 52 y.o.   MRN: [id number]  HPI The patient is a 52 YO female coming in for lower back pain radiating to the left leg. She has had this before. 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 It is severe 10/10 pain and she had to leave work early due to pain. She then got home and took robaxin and advil which took the pain down to 8/10 pain. She normally gets this less severe and just takes the robaxin and advil for pain. Started last night. Overall stable since onset. Denies fevers or chills. Denies numbness or weakness. No change in bowel or bladder.   Review of Systems  Constitutional: Positive for activity change. Negative for appetite change, chills, fatigue, fever and unexpected weight change.  Respiratory: Negative.   Cardiovascular: Negative.   Gastrointestinal: Negative.   Musculoskeletal: Positive for arthralgias, back pain and myalgias. Negative for gait problem and joint swelling.  Skin: Negative.   Neurological: Negative.     Objective:  Physical Exam Constitutional:      Appearance: She is well-developed.  HENT:     Head: Normocephalic and atraumatic.  Neck:     Musculoskeletal: Normal range of motion.  Cardiovascular:     Rate and Rhythm: Normal rate and regular rhythm.  Pulmonary:     Effort: Pulmonary effort is normal. No respiratory distress.     Breath sounds: Normal breath sounds. No wheezing or rales.  Abdominal:     General: There is no distension.     Palpations: Abdomen is soft.     Tenderness: There is no abdominal tenderness. There is no rebound.  Musculoskeletal:        General: Tenderness present.     Comments: Pain in the lumbar spine and paraspinally bilateral without radiation on palpation. No pain in the thoracic or cervical region.   Skin:    General: Skin is warm and dry.  Neurological:     Mental  Status: She is alert and oriented to person, place, and time.     Coordination: Coordination normal.     Vitals:   03/03/18 1034  BP: 132/82  Pulse: 74  Temp: 97.9 F (36.6 C)  TempSrc: Oral  SpO2: 99%  Weight: 278 lb (126.1 kg)  Height: 5\' 5"  (1.651 m)    Assessment & Plan:  toradol 30 mg IM given at visit

## 2018-05-16 ENCOUNTER — Other Ambulatory Visit: Payer: Self-pay | Admitting: Internal Medicine

## 2018-05-16 DIAGNOSIS — I10 Essential (primary) hypertension: Secondary | ICD-10-CM

## 2018-05-20 ENCOUNTER — Other Ambulatory Visit: Payer: Self-pay | Admitting: Internal Medicine

## 2018-05-20 DIAGNOSIS — M79604 Pain in right leg: Secondary | ICD-10-CM

## 2018-05-20 DIAGNOSIS — M545 Low back pain: Secondary | ICD-10-CM

## 2018-06-23 ENCOUNTER — Other Ambulatory Visit: Payer: Self-pay | Admitting: Internal Medicine

## 2018-06-23 DIAGNOSIS — K2100 Gastro-esophageal reflux disease with esophagitis, without bleeding: Secondary | ICD-10-CM

## 2018-06-27 ENCOUNTER — Telehealth: Payer: Self-pay

## 2018-06-27 NOTE — Telephone Encounter (Signed)
Key: [id number]

## 2018-06-28 ENCOUNTER — Other Ambulatory Visit: Payer: Self-pay | Admitting: Internal Medicine

## 2018-06-28 DIAGNOSIS — K2100 Gastro-esophageal reflux disease with esophagitis, without bleeding: Secondary | ICD-10-CM

## 2018-06-28 MED ORDER — OMEPRAZOLE 40 MG PO CPDR: 40.0000 mg | DELAYED_RELEASE_CAPSULE | Freq: Every day | ORAL | 1 refills | Status: DC

## 2018-06-29 ENCOUNTER — Ambulatory Visit (INDEPENDENT_AMBULATORY_CARE_PROVIDER_SITE_OTHER): Payer: PRIVATE HEALTH INSURANCE | Admitting: Internal Medicine

## 2018-06-29 ENCOUNTER — Ambulatory Visit: Payer: Self-pay | Admitting: Internal Medicine

## 2018-06-29 DIAGNOSIS — J40 Bronchitis, not specified as acute or chronic: Secondary | ICD-10-CM

## 2018-06-29 MED ORDER — PROMETHAZINE-DM 6.25-15 MG/5ML PO SYRP: 5.0000 mL | ORAL_SOLUTION | Freq: Four times a day (QID) | ORAL | 0 refills | Status: DC | PRN

## 2018-06-29 NOTE — Progress Notes (Signed)
Virtual Visit via Video Note  I connected with [name] on 06/29/18 at  4:00 PM EDT by a video enabled telemedicine application and verified that I am speaking with the correct person using two identifiers.   I discussed the limitations of evaluation and management by telemedicine and the availability of in person appointments. The patient expressed understanding and agreed to proceed.  History of Present Illness: She checked in for a virtual visit.  She works at [employer] where they have had an outbreak of 12 patients with COVID-19 infection.  She tells me she has been feeling poorly for 3 days with a nonproductive cough, headache, fever to 100.4, chills, muscle aches, and fatigue.  She tells me her test for COVID-19 came back positive about 24 hours ago.  She denies shortness of breath, chest pain, nausea, vomiting, diarrhea, or dizziness.  She did a pulse ox today and tells me that it was 98%.  She asked me to prescribe hydroxychloroquine and Zithromax.     Observations/Objective: On the video she was calm, cooperative, and appropriate.  There was no signs of respiratory or any other acute distress.  Lab Results  Component Value Date   WBC 10.7 (H) 08/18/2017   HGB 12.9 08/18/2017   HCT 38.6 08/18/2017   PLT 291.0 08/18/2017   GLUCOSE 96 08/18/2017   CHOL 194 08/18/2017   TRIG 92.0 08/18/2017   HDL 45.70 08/18/2017   LDLCALC 130 (H) 08/18/2017   ALT 11 08/18/2017   AST 18 08/18/2017   NA 137 08/18/2017   K 3.8 08/18/2017   CL 99 08/18/2017   CREATININE 0.74 08/18/2017   BUN 13 08/18/2017   CO2 29 08/18/2017   TSH 1.71 04/27/2017   INR 1.0 07/06/2007   HGBA1C 6.0 04/27/2017     Assessment and Plan: She tells me that she has confirmed COVID-19 infection.  There are currently no FDA approved treatment options for this.  Recent studies by the [facility] showed that patients that took hydroxychloroquine and Zithromax had a higher death rate than patients with COVID-19  that were not treated with this combination.  I therefore did not prescribe hydroxychloroquine and Zithromax.  It sounds like she has mild disease severity so I have asked her to manage this at home.  She will continue to monitor her vital signs.  I have asked her to take Tylenol as needed and to take Phenergan DM as needed for the cough and other symptoms.  Follow Up Instructions: She agrees to self quarantine for the next 2 weeks.  She will let me know if she develops any new or worsening symptoms.  She agrees to take the medications as needed.    I discussed the assessment and treatment plan with the patient. The patient was provided an opportunity to ask questions and all were answered. The patient agreed with the plan and demonstrated an understanding of the instructions.   The patient was advised to call back or seek an in-person evaluation if the symptoms worsen or if the condition fails to improve as anticipated.  I provided 25 minutes of non-face-to-face time during this encounter.   Gina Linger, MD

## 2018-06-29 NOTE — Telephone Encounter (Signed)
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 Cough, body aches, cough. Temp. 100.1. No shortness of breath. Asking if she needs to be on any medications for this. Warm transferred to Sam in the practice. Answer Assessment - Initial Assessment Questions 1. COVID-19 DIAGNOSIS: "Who made your Coronavirus (COVID-19) diagnosis?" "Was it confirmed by a positive lab test?" If not diagnosed by a HCP, ask "Are there lots of cases (community spread) where you live?" (See public health department website, if unsure)   * MAJOR community spread: high number of cases; numbers of cases are increasing; many people hospitalized.   * MINOR community spread: low number of cases; not increasing; few or no people hospitalized     Tested positive 2. ONSET: "When did the COVID-19 symptoms start?"      Started this past Sunday 3. WORST SYMPTOM: "What is your worst symptom?" (e.g., cough, fever, shortness of breath, muscle aches)     Fever, body aches, chest fullness, cough 4. COUGH: "How bad is the cough?"       Mild 5. FEVER: "Do you have a fever?" If so, ask: "What is your temperature, how was it measured, and when did it start?"     10 0.1 6. RESPIRATORY STATUS: "Describe your breathing?" (e.g., shortness of breath, wheezing, unable to speak)      No 7. BETTER-SAME-WORSE: "Are you getting better, staying the same or getting worse compared to yesterday?"  If getting worse, ask, "In what way?"     Same 8. HIGH RISK DISEASE: "Do you have any chronic medical problems?" (e.g., asthma, heart or lung disease, weak immune system, etc.)     No 9. PREGNANCY: "Is there any chance you are pregnant?" "When was your last menstrual period?"     No 10. OTHER SYMPTOMS: "Do you have any other symptoms?"  (e.g., runny nose, headache, sore throat, loss of smell)       Headache  Protocols used: CORONAVIRUS (COVID-19) DIAGNOSED OR SUSPECTED-A-AH

## 2018-06-29 NOTE — Telephone Encounter (Signed)
Virtual appt made for today °

## 2018-06-30 ENCOUNTER — Encounter: Payer: Self-pay | Admitting: Internal Medicine

## 2018-07-01 ENCOUNTER — Ambulatory Visit: Payer: Self-pay | Admitting: *Deleted

## 2018-07-01 NOTE — Telephone Encounter (Signed)
Patient advised that chest congestion will take time to resolve with this virus---patient denies any shortness of breath, just extreme chest congestion and sputum--patient states she is afebrile, patient advised that if she feels like she starts to experience shortness of breath or any problems with breathing that she can't manage at home, go to [facility] long ED for help

## 2018-07-01 NOTE — Telephone Encounter (Signed)
Pt called to let her pcp know that her chest congestion is not any better. She stated that per her pcp she is positive for the virus. She has quarantine herself and taking the cough medication that was prescribed for her. She had also contacted the health department and wants to know if she should have a chest xray because of the congestion. She would like a call back today please. Routing to flow at [facility] and [facility].

## 2018-07-01 NOTE — Telephone Encounter (Signed)
Can you please call patient? Thank you.

## 2018-07-02 ENCOUNTER — Emergency Department (HOSPITAL_COMMUNITY)
Admission: EM | Admit: 2018-07-02 | Discharge: 2018-07-02 | Disposition: A | Discharge status: Home / Self Care | Payer: PRIVATE HEALTH INSURANCE | Attending: Emergency Medicine | Admitting: Emergency Medicine

## 2018-07-02 ENCOUNTER — Emergency Department (HOSPITAL_COMMUNITY): Payer: PRIVATE HEALTH INSURANCE

## 2018-07-02 ENCOUNTER — Encounter (HOSPITAL_COMMUNITY): Payer: Self-pay

## 2018-07-02 ENCOUNTER — Other Ambulatory Visit: Payer: Self-pay

## 2018-07-02 DIAGNOSIS — J069 Acute upper respiratory infection, unspecified: Secondary | ICD-10-CM | POA: Insufficient documentation

## 2018-07-02 DIAGNOSIS — B9789 Other viral agents as the cause of diseases classified elsewhere: Secondary | ICD-10-CM | POA: Insufficient documentation

## 2018-07-02 DIAGNOSIS — Z79899 Other long term (current) drug therapy: Secondary | ICD-10-CM | POA: Insufficient documentation

## 2018-07-02 DIAGNOSIS — R05 Cough: Secondary | ICD-10-CM | POA: Diagnosis present

## 2018-07-02 DIAGNOSIS — I10 Essential (primary) hypertension: Secondary | ICD-10-CM | POA: Insufficient documentation

## 2018-07-02 DIAGNOSIS — R059 Cough, unspecified: Secondary | ICD-10-CM

## 2018-07-02 NOTE — ED Triage Notes (Signed)
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 Started feeling worsening body aches, congested, cough, fever since last Sunday. Stated she had test done at [facility] and was informed Tuesday test was positive. PCP told her if symptoms worsened to come to ED.

## 2018-07-02 NOTE — ED Provider Notes (Signed)
[facility]-EMERGENCY DEPT Provider Note   CSN: [id number] Arrival date & time: 07/02/18  1623    History   Chief Complaint Chief Complaint  Patient presents with   Nasal Congestion   Cough   Fever    HPI [name] is a 53 y.o. female.      Cough  Cough characteristics:  Non-productive Sputum characteristics:  Nondescript Severity:  Mild Onset quality:  Gradual Timing:  Intermittent Progression:  Waxing and waning Chronicity:  New Context comment:  COVID positive, worsening cough. No SOB. 7 days of symptoms so far. Fever still today. Took tylenol. Overall feels well but concerned about worsening cough.  Relieved by: tylenol. Worsened by:  Nothing Associated symptoms: chills, fever and myalgias   Associated symptoms: no chest pain, no ear pain, no rash, no shortness of breath and no sore throat   Fever  Associated symptoms: chills, cough and myalgias   Associated symptoms: no chest pain, no dysuria, no ear pain, no rash, no sore throat and no vomiting     Past Medical History:  Diagnosis Date   Arthritis    Esophageal stenosis    Esophagitis    GERD (gastroesophageal reflux disease)    Hiatal hernia    Hypertension    Iron deficiency anemia    Migraines    Morbid obesity (HCC)    Sleep apnea         Patient Active Problem List   Diagnosis Date Noted   Bronchitis due to COVID-19 virus 06/29/2018   Migraine 08/18/2017   Iron deficiency anemia due to chronic blood loss 05/18/2017   Vitamin D deficiency 04/27/2017   HLD (hyperlipidemia) 06/27/2016   Gastroesophageal reflux disease with esophagitis 12/20/2014   Fatty liver disease, nonalcoholic 12/11/2014   Obstructive sleep apnea 09/28/2013   Morbid obesity with BMI of 50.0-59.9, adult (HCC) 06/28/2013   Esophageal dysmotility 06/28/2013   Prediabetes 06/28/2013   Leukocytosis 06/28/2013   Low back pain radiating to both legs 06/28/2013   Hip  pain, bilateral 06/28/2013   Essential hypertension, benign 11/04/2011   Routine general medical examination at a health care facility 11/04/2011    Past Surgical History:  Procedure Laterality Date   ABDOMINAL HYSTERECTOMY     ABDOMINAL HYSTERECTOMY  2003   CARDIOVASCULAR STRESS TEST  07/25/2007   Negative stress echo; ef 55-60%   ESOPHAGEAL MANOMETRY  03/07/2012   Procedure: ESOPHAGEAL MANOMETRY (EM);  Surgeon: Mardella Layman, MD;  Location: [facility] END[facility];  Service: Endoscopy;  Laterality: N/A;   ESOPHAGOGASTRODUODENOSCOPY     KNEE ARTHROSCOPY  01/2011   left   KNEE SURGERY       OB History   No obstetric history on file.      Home Medications    Prior to Admission medications   Medication Sig Start Date End Date Taking? Authorizing Provider  acetaminophen (TYLENOL) 500 MG tablet Take 1 tablet (500 mg total) by mouth every 8 (eight) hours as needed. For pain 12/13/14  Yes Albertine Grates, MD  BYSTOLIC 10 MG tablet TAKE 1 TABLET(10 MG) BY MOUTH DAILY Patient taking differently: Take 10 mg by mouth daily.  05/16/18  Yes [name], MD  omeprazole (PRILOSEC) 40 MG capsule Take 1 capsule (40 mg total) by mouth daily. 06/28/18  Yes [name], MD  chlorthalidone (HYGROTON) 25 MG tablet Take 1 tablet (25 mg total) by mouth daily. Patient not taking: Reported on 07/02/2018 07/28/17   Olive Bass, FNP  Cholecalciferol 2000  units TABS Take 1 tablet (2,000 Units total) by mouth daily. Patient not taking: Reported on 07/02/2018 04/27/17   Etta GrandchildJones, Thomas L, MD  ferrous sulfate 325 (65 FE) MG tablet Take 1 tablet (325 mg total) by mouth 2 (two) times daily with a meal. Patient not taking: Reported on 07/02/2018 05/18/17   Etta GrandchildJones, Thomas L, MD  methocarbamol (ROBAXIN) 750 MG tablet TAKE 1 TABLET(750 MG) BY MOUTH EVERY 8 HOURS AS NEEDED FOR MUSCLE SPASMS Patient not taking: Reported on 07/02/2018 05/21/18   Etta GrandchildJones, Thomas L, MD  promethazine-dextromethorphan (PROMETHAZINE-DM)  6.25-15 MG/5ML syrup Take 5 mLs by mouth 4 (four) times daily as needed for cough. Patient not taking: Reported on 07/02/2018 06/29/18   Etta GrandchildJones, Thomas L, MD  traMADol (ULTRAM) 50 MG tablet TAKE 1 TABLET(50 MG) BY MOUTH EVERY 6 HOURS AS NEEDED Patient not taking: Reported on 07/02/2018 12/12/17   Etta GrandchildJones, Thomas L, MD    Family History Family History  Problem Relation Age of Onset   Cervical cancer Mother    Hypertension Sister    Diabetes Sister    Heart disease Neg Hx    Cancer Neg Hx    Alcohol abuse Neg Hx    Early death Neg Hx    Hearing loss Neg Hx    Hyperlipidemia Neg Hx    Kidney disease Neg Hx    Stroke Neg Hx     Social History Social History   Tobacco Use   Smoking status: Never Smoker   Smokeless tobacco: Never Used  Substance Use Topics   Alcohol use: No   Drug use: No     Allergies   Oxycodone and Pneumococcal vaccines   Review of Systems Review of Systems  Constitutional: Positive for chills and fever.  HENT: Negative for ear pain and sore throat.   Eyes: Negative for pain and visual disturbance.  Respiratory: Positive for cough. Negative for shortness of breath.   Cardiovascular: Negative for chest pain and palpitations.  Gastrointestinal: Negative for abdominal pain and vomiting.  Genitourinary: Negative for dysuria and hematuria.  Musculoskeletal: Positive for myalgias. Negative for arthralgias and back pain.  Skin: Negative for color change and rash.  Neurological: Negative for seizures and syncope.  All other systems reviewed and are negative.    Physical Exam Updated Vital Signs  ED Triage Vitals  Enc Vitals Group     BP 07/02/18 1631 140/88     Pulse Rate 07/02/18 1631 84     Resp 07/02/18 1631 18     Temp 07/02/18 1631 100.3 F (37.9 C)     Temp Source 07/02/18 1631 Oral     SpO2 07/02/18 1631 95 %     Weight 07/02/18 1645 279 lb (126.6 kg)     Height 07/02/18 1645 5\' 5"  (1.651 m)     Head Circumference --      Peak  Flow --      Pain Score 07/02/18 1645 0     Pain Loc --      Pain Edu? --      Excl. in GC? --     Physical Exam Vitals signs and nursing note reviewed.  Constitutional:      General: She is not in acute distress.    Appearance: She is well-developed. She is not ill-appearing.  HENT:     Head: Normocephalic and atraumatic.     Nose: Nose normal.  Eyes:     Extraocular Movements: Extraocular movements intact.     Conjunctiva/sclera: Conjunctivae  normal.     Pupils: Pupils are equal, round, and reactive to light.  Neck:     Musculoskeletal: Neck supple.  Cardiovascular:     Rate and Rhythm: Normal rate and regular rhythm.     Pulses: Normal pulses.     Heart sounds: Normal heart sounds. No murmur.  Pulmonary:     Effort: Pulmonary effort is normal. No respiratory distress.  Abdominal:     Palpations: Abdomen is soft.     Tenderness: There is no abdominal tenderness.  Musculoskeletal: Normal range of motion.  Skin:    General: Skin is warm and dry.     Capillary Refill: Capillary refill takes less than 2 seconds.  Neurological:     General: No focal deficit present.     Mental Status: She is alert.  Psychiatric:        Mood and Affect: Mood normal.      ED Treatments / Results  Labs (all labs ordered are listed, but only abnormal results are displayed) Labs Reviewed - No data to display  EKG None  Radiology Dg Chest Portable 1 View  Result Date: 07/02/2018 CLINICAL DATA:  Cough, congestion and fever for 6 days. COVID-19 exposure. EXAM: PORTABLE CHEST 1 VIEW COMPARISON:  Radiographs and CT 05/10/2017. FINDINGS: 1641 hours. The heart size and mediastinal contours are stable. The pulmonary vasculature appears somewhat ill-defined, but there is no overt pulmonary edema, confluent airspace opacity, pleural effusion or pneumothorax. No peripheral ground-glass opacities are identified. The bones appear unremarkable. Telemetry leads overlie the chest. IMPRESSION: Possible  vascular congestion or mild perihilar inflammation. No consolidation. Electronically Signed   By: Carey Bullocks M.D.   On: 07/02/2018 17:50    Procedures Procedures (including critical care time)  Medications Ordered in ED Medications - No data to display   Initial Impression / Assessment and Plan / ED Course  I have reviewed the triage vital signs and the nursing notes.  Pertinent labs & imaging results that were available during my care of the patient were reviewed by me and considered in my medical decision making (see chart for details).     Magnolia Mattila is a 53 year old female with history of hypertension who presents the ED with nasal congestion, cough, known to be positive for coronavirus.  Patient otherwise with normal vitals.  Temperature is 100.3.  Patient had Tylenol earlier today.  Patient with no shortness of breath.  She has had symptoms for the last 7 days.  She works in a nursing home.  Overall she says she feels well.  She has intermittent bouts of severe cough and congestion. But denies any true shortness of breath.  Overall has normal work of breathing on exam.  Looks very comfortable.  Vitals are normal.  Patient with normal respiratory rate.  Chest x-ray overall unremarkable.  Overall patient appears to have overall mild symptoms.  Seems to be progressing well throughout the disease process.  Patient already has cough medicine at home.  Recommend warm tea and honey for further cough suppression.  Given her an additional week off of work given that she still has a fever.  Health department is following along as well.  Given strict return precautions and discharged from ED in good condition.  Christal Lagerstrom was evaluated in Emergency Department on 07/02/2018 for the symptoms described in the history of present illness. She was evaluated in the context of the global COVID-19 pandemic, which necessitated consideration that the patient might be at risk for infection with  the SARS-CoV-2 virus that causes COVID-19. Institutional protocols and algorithms that pertain to the evaluation of patients at risk for COVID-19 are in a state of rapid change based on information released by regulatory bodies including the CDC and federal and state organizations. These policies and algorithms were followed during the patient's care in the ED.  This chart was dictated using voice recognition software.  Despite best efforts to proofread,  errors can occur which can change the documentation meaning.    Final Clinical Impressions(s) / ED Diagnoses   Final diagnoses:  Cough  Viral URI with cough    ED Discharge Orders    None       Virgina Norfolk, DO 07/02/18 1755

## 2018-07-02 NOTE — ED Notes (Signed)
Bed: UY40 Expected date:  Expected time:  Means of arrival:  Comments: +Covid-19 POV

## 2018-07-02 NOTE — Discharge Instructions (Addendum)
Overall your chest x-ray shows no major issues.  I believe that you are continuing to improve.  Continue Tylenol as needed for fever.  Stay well-hydrated.  Please contact your primary care doctor and return to ED if you have worsening respiratory symptoms as previously discussed.

## 2018-07-04 ENCOUNTER — Ambulatory Visit: Payer: Self-pay | Admitting: *Deleted

## 2018-07-04 NOTE — Telephone Encounter (Signed)
Spoke to pt and informed that rx for abx would not be prescribed as it would not help her symptoms. Pt wanted to know what to do since she was feeling bad. Pt informed to treat her symptoms at home. Pt stated understanding.

## 2018-07-04 NOTE — Telephone Encounter (Signed)
Pt called stating that she is +COVID 19; the says that her temp is 101.0 at 0600 07/04/2018; she has taken tylenol; the pt also says that her cough is violent; she would like for a z-pak to be prescribed; it has the pt says that the lowest her temp has been was 100.4 since she was diagnosed; the pt was also seen in the ED 07/02/2018; recommendations made per nurse triage protocol; explained to pt that the office is not open, but this information will be sent; the pt can be contacted at [phone]; the pt is seen by Dr Sanda Linger, [facility]; will route to office for final disposition.  Reason for Disposition . Fever present > 3 days (72 hours)  Answer Assessment - Initial Assessment Questions 1. COVID-19 DIAGNOSIS: "Who made your Coronavirus (COVID-19) diagnosis?" "Was it confirmed by a positive lab test?" If not diagnosed by a HCP, ask "Are there lots of cases (community spread) where you live?" (See public health department website, if unsure)   * MAJOR community spread: high number of cases; numbers of cases are increasing; many people hospitalized.   * MINOR community spread: low number of cases; not increasing; few or no people hospitalized     06/29/2018 2. ONSET: "When did the COVID-19 symptoms start?"      06/26/2018 3. WORST SYMPTOM: "What is your worst symptom?" (e.g., cough, fever, shortness of breath, muscle aches)     fever 4. COUGH: "How bad is the cough?"      "violent" 5. FEVER: "Do you have a fever?" If so, ask: "What is your temperature, how was it measured, and when did it start?"     101.0 at 0600 07/04/2018 6. RESPIRATORY STATUS: "Describe your breathing?" (e.g., shortness of breath, wheezing, unable to speak)      Breathing ok 7. BETTER-SAME-WORSE: "Are you getting better, staying the same or getting worse compared to yesterday?"  If getting worse, ask, "In what way?"    worse 8. HIGH RISK DISEASE: "Do you have any chronic medical problems?" (e.g., asthma, heart or lung  disease, weak immune system, etc.)     hypertension 9. PREGNANCY: "Is there any chance you are pregnant?" "When was your last menstrual period?"    No hysterectomy 10. OTHER SYMPTOMS: "Do you have any other symptoms?"  (e.g., runny nose, headache, sore throat, loss of smell)       Stuffy nose, loss of smell and taste  Protocols used: CORONAVIRUS (COVID-19) DIAGNOSED OR SUSPECTED-A-AH

## 2018-08-16 ENCOUNTER — Other Ambulatory Visit: Payer: Self-pay | Admitting: Internal Medicine

## 2018-08-16 DIAGNOSIS — M79604 Pain in right leg: Secondary | ICD-10-CM

## 2018-08-16 DIAGNOSIS — M545 Low back pain, unspecified: Secondary | ICD-10-CM

## 2018-08-16 DIAGNOSIS — M25551 Pain in right hip: Secondary | ICD-10-CM

## 2018-09-08 ENCOUNTER — Telehealth: Payer: Self-pay

## 2018-09-08 ENCOUNTER — Other Ambulatory Visit: Payer: Self-pay | Admitting: Internal Medicine

## 2018-09-08 DIAGNOSIS — I10 Essential (primary) hypertension: Secondary | ICD-10-CM

## 2018-09-08 MED ORDER — NEBIVOLOL HCL 10 MG PO TABS: 10.0000 mg | ORAL_TABLET | Freq: Every day | ORAL | 0 refills | Status: DC

## 2018-09-08 NOTE — Telephone Encounter (Signed)
lvm informing patient of same.

## 2018-09-08 NOTE — Telephone Encounter (Signed)
Refill for bystolic -   Per PCP - pt is due for an office visit to follow up on BP.

## 2018-09-13 NOTE — Telephone Encounter (Signed)
Called pt again, no answer.   Can you try calling pt for appt with PCP

## 2018-09-14 NOTE — Telephone Encounter (Signed)
LVM for patient to call back to make appt  °

## 2018-09-20 ENCOUNTER — Encounter: Payer: Self-pay | Admitting: Internal Medicine

## 2018-09-20 ENCOUNTER — Other Ambulatory Visit: Payer: PRIVATE HEALTH INSURANCE

## 2018-09-20 ENCOUNTER — Other Ambulatory Visit: Payer: Self-pay

## 2018-09-20 ENCOUNTER — Ambulatory Visit (INDEPENDENT_AMBULATORY_CARE_PROVIDER_SITE_OTHER): Payer: PRIVATE HEALTH INSURANCE | Admitting: Internal Medicine

## 2018-09-20 ENCOUNTER — Other Ambulatory Visit (INDEPENDENT_AMBULATORY_CARE_PROVIDER_SITE_OTHER): Payer: PRIVATE HEALTH INSURANCE

## 2018-09-20 VITALS — BP 156/88 | HR 70 | Temp 98.7°F | Ht 65.0 in | Wt 278.0 lb

## 2018-09-20 DIAGNOSIS — I10 Essential (primary) hypertension: Secondary | ICD-10-CM | POA: Diagnosis not present

## 2018-09-20 DIAGNOSIS — R7303 Prediabetes: Secondary | ICD-10-CM | POA: Diagnosis not present

## 2018-09-20 DIAGNOSIS — E559 Vitamin D deficiency, unspecified: Secondary | ICD-10-CM

## 2018-09-20 DIAGNOSIS — Z0000 Encounter for general adult medical examination without abnormal findings: Secondary | ICD-10-CM

## 2018-09-20 DIAGNOSIS — D500 Iron deficiency anemia secondary to blood loss (chronic): Secondary | ICD-10-CM

## 2018-09-20 DIAGNOSIS — K760 Fatty (change of) liver, not elsewhere classified: Secondary | ICD-10-CM

## 2018-09-20 DIAGNOSIS — Z1211 Encounter for screening for malignant neoplasm of colon: Secondary | ICD-10-CM

## 2018-09-20 DIAGNOSIS — D72829 Elevated white blood cell count, unspecified: Secondary | ICD-10-CM

## 2018-09-20 DIAGNOSIS — K2100 Gastro-esophageal reflux disease with esophagitis, without bleeding: Secondary | ICD-10-CM

## 2018-09-20 DIAGNOSIS — Z6841 Body Mass Index (BMI) 40.0 and over, adult: Secondary | ICD-10-CM

## 2018-09-20 DIAGNOSIS — G43709 Chronic migraine without aura, not intractable, without status migrainosus: Secondary | ICD-10-CM

## 2018-09-20 LAB — VITAMIN D 25 HYDROXY (VIT D DEFICIENCY, FRACTURES): VITD: 15.12 ng/mL — ABNORMAL LOW (ref 30.00–100.00)

## 2018-09-20 LAB — URINALYSIS, ROUTINE W REFLEX MICROSCOPIC
Bilirubin Urine: NEGATIVE
Hgb urine dipstick: NEGATIVE
Ketones, ur: NEGATIVE
Leukocytes,Ua: NEGATIVE
Nitrite: NEGATIVE
RBC / HPF: NONE SEEN (ref 0–?)
Specific Gravity, Urine: 1.02 (ref 1.000–1.030)
Total Protein, Urine: NEGATIVE
Urine Glucose: NEGATIVE
Urobilinogen, UA: 0.2 (ref 0.0–1.0)
pH: 6 (ref 5.0–8.0)

## 2018-09-20 LAB — BASIC METABOLIC PANEL
BUN: 7 mg/dL (ref 6–23)
CO2: 26 mEq/L (ref 19–32)
Calcium: 9 mg/dL (ref 8.4–10.5)
Chloride: 106 mEq/L (ref 96–112)
Creatinine, Ser: 0.57 mg/dL (ref 0.40–1.20)
GFR: 133.65 mL/min (ref >60.00)
Glucose, Bld: 81 mg/dL (ref 70–99)
Potassium: 3.6 mEq/L (ref 3.5–5.1)
Sodium: 140 mEq/L (ref 135–145)

## 2018-09-20 LAB — FERRITIN: Ferritin: 163.8 ng/mL (ref 10.0–291.0)

## 2018-09-20 LAB — HEPATIC FUNCTION PANEL
ALT: 7 U/L (ref 0–35)
AST: 12 U/L (ref 0–37)
Albumin: 4.1 g/dL (ref 3.5–5.2)
Alkaline Phosphatase: 78 U/L (ref 39–117)
Bilirubin, Direct: 0 mg/dL (ref 0.0–0.3)
Total Bilirubin: 0.6 mg/dL (ref 0.2–1.2)
Total Protein: 7.6 g/dL (ref 6.0–8.3)

## 2018-09-20 LAB — CBC WITH DIFFERENTIAL/PLATELET
Basophils Absolute: 0 10*3/uL (ref 0.0–0.1)
Basophils Relative: 0.4 % (ref 0.0–3.0)
Eosinophils Absolute: 0.3 10*3/uL (ref 0.0–0.7)
Eosinophils Relative: 3 % (ref 0.0–5.0)
HCT: 39.2 % (ref 36.0–46.0)
Hemoglobin: 13 g/dL (ref 12.0–15.0)
Lymphocytes Relative: 36.9 % (ref 12.0–46.0)
Lymphs Abs: 4.1 10*3/uL — ABNORMAL HIGH (ref 0.7–4.0)
MCHC: 33.1 g/dL (ref 30.0–36.0)
MCV: 86.5 fl (ref 78.0–100.0)
Monocytes Absolute: 0.9 10*3/uL (ref 0.1–1.0)
Monocytes Relative: 8 % (ref 3.0–12.0)
Neutro Abs: 5.7 10*3/uL (ref 1.4–7.7)
Neutrophils Relative %: 51.7 % (ref 43.0–77.0)
Platelets: 271 10*3/uL (ref 150.0–400.0)
RBC: 4.53 Mil/uL (ref 3.87–5.11)
RDW: 14.4 % (ref 11.5–15.5)
WBC: 11.1 10*3/uL — ABNORMAL HIGH (ref 4.0–10.5)

## 2018-09-20 LAB — LIPID PANEL
Cholesterol: 193 mg/dL (ref 0–200)
HDL: 55.4 mg/dL (ref >39.00)
LDL Cholesterol: 121 mg/dL — ABNORMAL HIGH (ref 0–99)
NonHDL: 137.78
Total CHOL/HDL Ratio: 3
Triglycerides: 83 mg/dL (ref 0.0–149.0)
VLDL: 16.6 mg/dL (ref 0.0–40.0)

## 2018-09-20 LAB — HEMOGLOBIN A1C: Hgb A1c MFr Bld: 5.7 % (ref 4.6–6.5)

## 2018-09-20 LAB — IBC PANEL
Iron: 58 ug/dL (ref 42–145)
Saturation Ratios: 20.4 % (ref 20.0–50.0)
Transferrin: 203 mg/dL — ABNORMAL LOW (ref 212.0–360.0)

## 2018-09-20 LAB — TSH: TSH: 1.6 u[IU]/mL (ref 0.35–4.50)

## 2018-09-20 MED ORDER — CHOLECALCIFEROL 1.25 MG (50000 UT) PO CAPS: 50000.0000 [IU] | ORAL_CAPSULE | ORAL | 1 refills | Status: DC

## 2018-09-20 MED ORDER — UBRELVY 100 MG PO TABS: 1.0000 | ORAL_TABLET | Freq: Every day | ORAL | 5 refills | Status: DC | PRN

## 2018-09-20 MED ORDER — INDAPAMIDE 1.25 MG PO TABS: 1.2500 mg | ORAL_TABLET | Freq: Every day | ORAL | 1 refills | Status: DC

## 2018-09-20 NOTE — Progress Notes (Signed)
Subjective:  Patient ID: [name], female    DOB: 02/06/1966  Age: 53 y.o. MRN: [id number]  CC: Annual Exam, Anemia, and Hypertension   HPI [name] presents for a CPX.  She complains of intermittent headaches.  Her last headache was about 3 weeks ago.  She describes them as typical migraines.  She has tried triptans but it takes about a day or 2 for the triptan to alleviate the headache.  She has had no recent changes or worsening of her headaches.  She complains that her blood pressure is not adequately well controlled.  She is compliant with nebivolol.  She walk several miles a day and denies any recent episodes of shortness of breath, DOE, CP, palpitations, edema, or fatigue.   Outpatient Medications Prior to Visit  Medication Sig Dispense Refill  . nebivolol (BYSTOLIC) 10 MG tablet Take 1 tablet (10 mg total) by mouth daily. 90 tablet 0  . omeprazole (PRILOSEC) 40 MG capsule Take 1 capsule (40 mg total) by mouth daily. 90 capsule 1  . traMADol (ULTRAM) 50 MG tablet TAKE 1 TABLET(50 MG) BY MOUTH EVERY 6 HOURS AS NEEDED 65 tablet 1  . Cholecalciferol 2000 units TABS Take 1 tablet (2,000 Units total) by mouth daily. 90 tablet 1  . acetaminophen (TYLENOL) 500 MG tablet Take 1 tablet (500 mg total) by mouth every 8 (eight) hours as needed. For pain (Patient not taking: Reported on 09/20/2018) 30 tablet 0  . chlorthalidone (HYGROTON) 25 MG tablet Take 1 tablet (25 mg total) by mouth daily. (Patient not taking: Reported on 07/02/2018) 90 tablet 0  . ferrous sulfate 325 (65 FE) MG tablet Take 1 tablet (325 mg total) by mouth 2 (two) times daily with a meal. (Patient not taking: Reported on 07/02/2018) 180 tablet 1   No facility-administered medications prior to visit.     ROS Review of Systems  Constitutional: Negative.  Negative for diaphoresis, fatigue and unexpected weight change.  HENT: Negative.   Eyes: Negative for visual disturbance.  Respiratory: Negative for  cough, chest tightness, shortness of breath and wheezing.   Cardiovascular: Negative for chest pain, palpitations and leg swelling.  Gastrointestinal: Negative for abdominal pain, constipation, diarrhea, nausea and vomiting.  Endocrine: Negative.   Genitourinary: Negative.  Negative for difficulty urinating.  Musculoskeletal: Negative.  Negative for arthralgias, back pain, myalgias and neck pain.  Skin: Negative.  Negative for color change.  Neurological: Negative.  Negative for dizziness, syncope, weakness, light-headedness, numbness and headaches.  Hematological: Negative for adenopathy. Does not bruise/bleed easily.  Psychiatric/Behavioral: Negative.     Objective:  BP (!) 156/88 (BP Location: Left Arm, Patient Position: Sitting, Cuff Size: Large) Comment: BP (R) 156/88 (L) 152/86  Pulse 70   Temp 98.7 F (37.1 C) (Oral)   Ht 5\' 5"  (1.651 m)   Wt 278 lb (126.1 kg)   SpO2 97%   BMI 46.26 kg/m   BP Readings from Last 3 Encounters:  09/20/18 (!) 156/88  07/02/18 140/88  03/03/18 132/82    Wt Readings from Last 3 Encounters:  09/20/18 278 lb (126.1 kg)  07/02/18 279 lb (126.6 kg)  03/03/18 278 lb (126.1 kg)    Physical Exam Vitals signs reviewed.  Constitutional:      Appearance: She is obese. She is not ill-appearing or diaphoretic.  HENT:     Nose: Nose normal.     Mouth/Throat:     Mouth: Mucous membranes are moist.  Eyes:     Conjunctiva/sclera: Conjunctivae normal.  Neck:     Musculoskeletal: Normal range of motion. No neck rigidity.  Cardiovascular:     Rate and Rhythm: Normal rate and regular rhythm.     Heart sounds: No murmur.     Comments: EKG ---  Sinus  Rhythm  WITHIN NORMAL LIMITS  Pulmonary:     Effort: Pulmonary effort is normal.     Breath sounds: Normal breath sounds. No stridor. No wheezing, rhonchi or rales.  Abdominal:     General: Abdomen is protuberant. Bowel sounds are normal. There is no distension.     Palpations: Abdomen is soft.  There is no hepatomegaly, splenomegaly or mass.     Tenderness: There is no abdominal tenderness.     Hernia: No hernia is present.  Musculoskeletal: Normal range of motion.     Right lower leg: No edema.     Left lower leg: No edema.  Skin:    General: Skin is warm and dry.     Coloration: Skin is not pale.  Neurological:     General: No focal deficit present.     Mental Status: She is alert and oriented to person, place, and time. Mental status is at baseline.  Psychiatric:        Mood and Affect: Mood normal.        Behavior: Behavior normal.     Lab Results  Component Value Date   WBC 11.1 (H) 09/20/2018   HGB 13.0 09/20/2018   HCT 39.2 09/20/2018   PLT 271.0 09/20/2018   GLUCOSE 81 09/20/2018   CHOL 193 09/20/2018   TRIG 83.0 09/20/2018   HDL 55.40 09/20/2018   LDLCALC 121 (H) 09/20/2018   ALT 7 09/20/2018   AST 12 09/20/2018   NA 140 09/20/2018   K 3.6 09/20/2018   CL 106 09/20/2018   CREATININE 0.57 09/20/2018   BUN 7 09/20/2018   CO2 26 09/20/2018   TSH 1.60 09/20/2018   INR 1.0 07/06/2007   HGBA1C 5.7 09/20/2018    Dg Chest Portable 1 View  Result Date: 07/02/2018 CLINICAL DATA:  Cough, congestion and fever for 6 days. COVID-19 exposure. EXAM: PORTABLE CHEST 1 VIEW COMPARISON:  Radiographs and CT 05/10/2017. FINDINGS: 1641 hours. The heart size and mediastinal contours are stable. The pulmonary vasculature appears somewhat ill-defined, but there is no overt pulmonary edema, confluent airspace opacity, pleural effusion or pneumothorax. No peripheral ground-glass opacities are identified. The bones appear unremarkable. Telemetry leads overlie the chest. IMPRESSION: Possible vascular congestion or mild perihilar inflammation. No consolidation. Electronically Signed   By: Richardean Sale M.D.   On: 07/02/2018 17:50    Assessment & Plan:   [name] was seen today for annual exam, anemia and hypertension.  Diagnoses and all orders for this visit:  Essential  hypertension, benign- Her blood pressure is not adequately well controlled on nebivolol.  Her EKG is negative for LVH or ischemia.  Her labs are negative for secondary causes or endorgan damage.  I have asked her to add a thiazide diuretic to the nebivolol. -     CBC with Differential/Platelet; Future -     Basic metabolic panel; Future -     TSH; Future -     Urinalysis, Routine w reflex microscopic; Future -     EKG 12-Lead -     indapamide (LOZOL) 1.25 MG tablet; Take 1 tablet (1.25 mg total) by mouth daily.  Fatty liver disease, nonalcoholic- Her LFTs are normal now. -     Hepatic function  panel; Future  Gastroesophageal reflux disease with esophagitis- No complications noted related to this.  Iron deficiency anemia due to chronic blood loss- Her H&H are normal now and she is no longer taking the iron supplement. -     IBC panel; Future -     CBC with Differential/Platelet; Future -     Ferritin; Future  Prediabetes- Her blood sugar is normal now.  She was praised for her lifestyle modifications. -     Hemoglobin A1c; Future  Routine general medical examination at a health care facility- Exam completed, labs reviewed, vaccines reviewed, to screen for colon cancer/polyps I ordered a Cologuard, she tells me her Pap and mammogram are up to a day with her gynecologist. -     Lipid panel; Future  Vitamin D deficiency -     VITAMIN D 25 Hydroxy (Vit-D Deficiency, Fractures); Future -     Cholecalciferol 1.25 MG (50000 UT) capsule; Take 1 capsule (50,000 Units total) by mouth once a week.  Leukocytosis, unspecified type- This is a stable finding for her.  I do not see any evidence of lymphoproliferative disease. -     CBC with Differential/Platelet; Future  Morbid obesity with BMI of 50.0-59.9, adult (HCC)- She was praised for her lifestyle modifications.  Colon cancer screening -     Cologuard  Chronic migraine without aura without status migrainosus, not intractable- I recommended  that she a CGRP antagonist as I think it will be more effective than the triptan. -     Ubrogepant (UBRELVY) 100 MG TABS; Take 1 tablet by mouth daily as needed.   I have discontinued Amoni Mercer's acetaminophen, Cholecalciferol, ferrous sulfate, and chlorthalidone. I am also having her start on Ubrelvy, indapamide, and Cholecalciferol. Additionally, I am having her maintain her omeprazole, traMADol, and nebivolol.  Meds ordered this encounter  Medications  . Ubrogepant (UBRELVY) 100 MG TABS    Sig: Take 1 tablet by mouth daily as needed.    Dispense:  10 tablet    Refill:  5  . indapamide (LOZOL) 1.25 MG tablet    Sig: Take 1 tablet (1.25 mg total) by mouth daily.    Dispense:  90 tablet    Refill:  1  . Cholecalciferol 1.25 MG (50000 UT) capsule    Sig: Take 1 capsule (50,000 Units total) by mouth once a week.    Dispense:  12 capsule    Refill:  1     Follow-up: Return in about 3 months (around 12/21/2018).  Sanda Lingerhomas Aul Mangieri, MD

## 2018-09-20 NOTE — Patient Instructions (Signed)

## 2018-10-04 ENCOUNTER — Encounter: Payer: Self-pay | Admitting: Internal Medicine

## 2018-10-06 ENCOUNTER — Encounter: Payer: Self-pay | Admitting: Internal Medicine

## 2018-10-06 ENCOUNTER — Other Ambulatory Visit: Payer: Self-pay

## 2018-10-06 ENCOUNTER — Ambulatory Visit (INDEPENDENT_AMBULATORY_CARE_PROVIDER_SITE_OTHER): Payer: PRIVATE HEALTH INSURANCE | Admitting: Internal Medicine

## 2018-10-06 VITALS — BP 132/88 | HR 56 | Temp 98.0°F | Resp 16 | Ht 65.0 in | Wt 269.0 lb

## 2018-10-06 DIAGNOSIS — I10 Essential (primary) hypertension: Secondary | ICD-10-CM | POA: Diagnosis not present

## 2018-10-06 NOTE — Progress Notes (Signed)
Subjective:  Patient ID: [name], female    DOB: 1966/01/08  Age: 53 y.o. MRN: [id number]  CC: Hypertension   HPI [name] presents for a BP check - She has been working on her lifestyle modifications since I last saw her and has been able to lose 9 pounds.  She tells me her blood pressure has been well controlled.  Outpatient Medications Prior to Visit  Medication Sig Dispense Refill  . Cholecalciferol 1.25 MG (50000 UT) capsule Take 1 capsule (50,000 Units total) by mouth once a week. 12 capsule 1  . indapamide (LOZOL) 1.25 MG tablet Take 1 tablet (1.25 mg total) by mouth daily. 90 tablet 1  . nebivolol (BYSTOLIC) 10 MG tablet Take 1 tablet (10 mg total) by mouth daily. 90 tablet 0  . omeprazole (PRILOSEC) 40 MG capsule Take 1 capsule (40 mg total) by mouth daily. 90 capsule 1  . traMADol (ULTRAM) 50 MG tablet TAKE 1 TABLET(50 MG) BY MOUTH EVERY 6 HOURS AS NEEDED 65 tablet 1  . Ubrogepant (UBRELVY) 100 MG TABS Take 1 tablet by mouth daily as needed. 10 tablet 5   No facility-administered medications prior to visit.     ROS Review of Systems  Constitutional: Negative for diaphoresis, fatigue and unexpected weight change.  HENT: Negative.   Eyes: Negative for visual disturbance.  Respiratory: Negative for cough, chest tightness, shortness of breath and wheezing.   Cardiovascular: Negative for chest pain, palpitations and leg swelling.  Gastrointestinal: Negative for abdominal pain, constipation, diarrhea, nausea and vomiting.  Endocrine: Negative.   Genitourinary: Negative.  Negative for difficulty urinating.  Musculoskeletal: Negative.  Negative for arthralgias and myalgias.  Skin: Negative.  Negative for color change.  Neurological: Negative.  Negative for dizziness, weakness, light-headedness and headaches.  Hematological: Negative for adenopathy. Does not bruise/bleed easily.  Psychiatric/Behavioral: Negative.     Objective:  BP 132/88 (BP Location: Left  Arm, Patient Position: Sitting, Cuff Size: Large)   Pulse (!) 56   Temp 98 F (36.7 C) (Oral)   Resp 16   Ht 5\' 5"  (1.651 m)   Wt 269 lb (122 kg)   SpO2 98%   BMI 44.76 kg/m   BP Readings from Last 3 Encounters:  10/06/18 132/88  09/20/18 (!) 156/88  07/02/18 140/88    Wt Readings from Last 3 Encounters:  10/06/18 269 lb (122 kg)  09/20/18 278 lb (126.1 kg)  07/02/18 279 lb (126.6 kg)    Physical Exam Vitals signs reviewed.  Constitutional:      Appearance: She is obese. She is not ill-appearing or diaphoretic.  HENT:     Nose: Nose normal.     Mouth/Throat:     Mouth: Mucous membranes are moist.  Eyes:     General: No scleral icterus.    Conjunctiva/sclera: Conjunctivae normal.  Neck:     Musculoskeletal: Normal range of motion. No neck rigidity.  Cardiovascular:     Rate and Rhythm: Regular rhythm. Bradycardia present.     Heart sounds: No murmur.  Pulmonary:     Effort: Pulmonary effort is normal.     Breath sounds: No stridor. No wheezing, rhonchi or rales.  Abdominal:     General: Abdomen is protuberant. Bowel sounds are normal. There is no distension.     Palpations: There is no hepatomegaly or splenomegaly.     Tenderness: There is no abdominal tenderness.  Musculoskeletal: Normal range of motion.     Right lower leg: No edema.  Left lower leg: No edema.  Skin:    General: Skin is warm and dry.     Coloration: Skin is not pale.  Neurological:     General: No focal deficit present.     Mental Status: She is oriented to person, place, and time. Mental status is at baseline.  Psychiatric:        Mood and Affect: Mood normal.     Lab Results  Component Value Date   WBC 11.1 (H) 09/20/2018   HGB 13.0 09/20/2018   HCT 39.2 09/20/2018   PLT 271.0 09/20/2018   GLUCOSE 81 09/20/2018   CHOL 193 09/20/2018   TRIG 83.0 09/20/2018   HDL 55.40 09/20/2018   LDLCALC 121 (H) 09/20/2018   ALT 7 09/20/2018   AST 12 09/20/2018   NA 140 09/20/2018   K  3.6 09/20/2018   CL 106 09/20/2018   CREATININE 0.57 09/20/2018   BUN 7 09/20/2018   CO2 26 09/20/2018   TSH 1.60 09/20/2018   INR 1.0 07/06/2007   HGBA1C 5.7 09/20/2018    Dg Chest Portable 1 View  Result Date: 07/02/2018 CLINICAL DATA:  Cough, congestion and fever for 6 days. COVID-19 exposure. EXAM: PORTABLE CHEST 1 VIEW COMPARISON:  Radiographs and CT 05/10/2017. FINDINGS: 1641 hours. The heart size and mediastinal contours are stable. The pulmonary vasculature appears somewhat ill-defined, but there is no overt pulmonary edema, confluent airspace opacity, pleural effusion or pneumothorax. No peripheral ground-glass opacities are identified. The bones appear unremarkable. Telemetry leads overlie the chest. IMPRESSION: Possible vascular congestion or mild perihilar inflammation. No consolidation. Electronically Signed   By: Carey BullocksWilliam  Veazey M.D.   On: 07/02/2018 17:50    Assessment & Plan:   [name] was seen today for hypertension.  Diagnoses and all orders for this visit:  Essential hypertension, benign- Her blood pressure is adequately well controlled.  Will continue the combination of indapamide and nebivolol.   I am having Trina Zalar maintain her omeprazole, traMADol, nebivolol, Ubrelvy, indapamide, and Cholecalciferol.  No orders of the defined types were placed in this encounter.    Follow-up: Return in about 6 months (around 04/08/2019).  Sanda Lingerhomas Jones, MD

## 2018-10-09 ENCOUNTER — Encounter: Payer: Self-pay | Admitting: Internal Medicine

## 2018-10-09 NOTE — Patient Instructions (Signed)

## 2018-10-12 LAB — COLOGUARD: Cologuard: NEGATIVE

## 2018-10-14 ENCOUNTER — Encounter: Payer: Self-pay | Admitting: Internal Medicine

## 2018-10-14 NOTE — Progress Notes (Signed)
Pt contacted and given negative result.  

## 2018-11-05 ENCOUNTER — Emergency Department (HOSPITAL_BASED_OUTPATIENT_CLINIC_OR_DEPARTMENT_OTHER): Payer: PRIVATE HEALTH INSURANCE

## 2018-11-05 ENCOUNTER — Other Ambulatory Visit: Payer: Self-pay

## 2018-11-05 ENCOUNTER — Emergency Department (HOSPITAL_BASED_OUTPATIENT_CLINIC_OR_DEPARTMENT_OTHER)
Admission: EM | Admit: 2018-11-05 | Discharge: 2018-11-05 | Disposition: A | Discharge status: Home / Self Care | Payer: PRIVATE HEALTH INSURANCE | Attending: Emergency Medicine | Admitting: Emergency Medicine

## 2018-11-05 ENCOUNTER — Encounter (HOSPITAL_BASED_OUTPATIENT_CLINIC_OR_DEPARTMENT_OTHER): Payer: Self-pay | Admitting: Emergency Medicine

## 2018-11-05 DIAGNOSIS — I10 Essential (primary) hypertension: Secondary | ICD-10-CM | POA: Insufficient documentation

## 2018-11-05 DIAGNOSIS — R110 Nausea: Secondary | ICD-10-CM | POA: Insufficient documentation

## 2018-11-05 DIAGNOSIS — R1011 Right upper quadrant pain: Secondary | ICD-10-CM

## 2018-11-05 DIAGNOSIS — Z79899 Other long term (current) drug therapy: Secondary | ICD-10-CM | POA: Insufficient documentation

## 2018-11-05 DIAGNOSIS — R109 Unspecified abdominal pain: Secondary | ICD-10-CM | POA: Diagnosis not present

## 2018-11-05 LAB — URINALYSIS, ROUTINE W REFLEX MICROSCOPIC
Bilirubin Urine: NEGATIVE
Glucose, UA: NEGATIVE mg/dL
Hgb urine dipstick: NEGATIVE
Ketones, ur: NEGATIVE mg/dL
Leukocytes,Ua: NEGATIVE
Nitrite: NEGATIVE
Protein, ur: NEGATIVE mg/dL
Specific Gravity, Urine: <1.005 — ABNORMAL LOW (ref 1.005–1.030)
pH: 6 (ref 5.0–8.0)

## 2018-11-05 LAB — COMPREHENSIVE METABOLIC PANEL
ALT: 12 U/L (ref 0–44)
AST: 17 U/L (ref 15–41)
Albumin: 3.8 g/dL (ref 3.5–5.0)
Alkaline Phosphatase: 78 U/L (ref 38–126)
Anion gap: 10 (ref 5–15)
BUN: 9 mg/dL (ref 6–20)
CO2: 25 mmol/L (ref 22–32)
Calcium: 9.1 mg/dL (ref 8.9–10.3)
Chloride: 104 mmol/L (ref 98–111)
Creatinine, Ser: 0.64 mg/dL (ref 0.44–1.00)
GFR calc Af Amer: >60 mL/min (ref >60)
GFR calc non Af Amer: >60 mL/min (ref >60)
Glucose, Bld: 90 mg/dL (ref 70–99)
Potassium: 3.8 mmol/L (ref 3.5–5.1)
Sodium: 139 mmol/L (ref 135–145)
Total Bilirubin: 0.8 mg/dL (ref 0.3–1.2)
Total Protein: 7.7 g/dL (ref 6.5–8.1)

## 2018-11-05 LAB — CBC WITH DIFFERENTIAL/PLATELET
Abs Immature Granulocytes: 0.03 10*3/uL (ref 0.00–0.07)
Basophils Absolute: 0.1 10*3/uL (ref 0.0–0.1)
Basophils Relative: 1 %
Eosinophils Absolute: 0.3 10*3/uL (ref 0.0–0.5)
Eosinophils Relative: 3 %
HCT: 39.9 % (ref 36.0–46.0)
Hemoglobin: 12.4 g/dL (ref 12.0–15.0)
Immature Granulocytes: 0 %
Lymphocytes Relative: 31 %
Lymphs Abs: 3.5 10*3/uL (ref 0.7–4.0)
MCH: 27.9 pg (ref 26.0–34.0)
MCHC: 31.1 g/dL (ref 30.0–36.0)
MCV: 89.7 fL (ref 80.0–100.0)
Monocytes Absolute: 0.7 10*3/uL (ref 0.1–1.0)
Monocytes Relative: 6 %
Neutro Abs: 6.7 10*3/uL (ref 1.7–7.7)
Neutrophils Relative %: 59 %
Platelets: 286 10*3/uL (ref 150–400)
RBC: 4.45 MIL/uL (ref 3.87–5.11)
RDW: 13.5 % (ref 11.5–15.5)
WBC: 11.3 10*3/uL — ABNORMAL HIGH (ref 4.0–10.5)
nRBC: 0 % (ref 0.0–0.2)

## 2018-11-05 MED ORDER — METHOCARBAMOL 500 MG PO TABS: 500.0000 mg | ORAL_TABLET | Freq: Three times a day (TID) | ORAL | 0 refills | Status: DC | PRN

## 2018-11-05 NOTE — ED Triage Notes (Signed)
Pt here with right flank pain x 1 week. Nausea a few days ago, but not vomiting.

## 2018-11-05 NOTE — ED Notes (Signed)
Patient transported to Ultrasound 

## 2018-11-05 NOTE — ED Provider Notes (Signed)
[facility] EMERGENCY DEPARTMENT Provider Note   CSN: [id number] Arrival date & time: 11/05/18  [id number]     History   Chief Complaint Chief Complaint  Patient presents with  . Flank Pain    HPI [name] is a 53 y.o. female.     HPI Patient presents with right flank pain.  Right upper abdomen going to right back.  Began around a week ago.  Has been mostly constant.  Had some nausea but no vomiting.  No fevers.  No dysuria.  Has not had pains like this before.  No trauma.  States that she took some ibuprofen and muscle relaxers and that is helped her sleep.  She is had a somewhat decreased appetite.  No dysuria.  No diarrhea or constipation.  Still has her gallbladder.  States she has had some episodes like this in the past, however they have just gone away.  Pain is constant. Past Medical History:  Diagnosis Date  . Arthritis   . Esophageal stenosis   . Esophagitis   . GERD (gastroesophageal reflux disease)   . Hiatal hernia   . Hypertension   . Iron deficiency anemia   . Migraines   . Morbid obesity (HCC)   . Sleep apnea         Patient Active Problem List   Diagnosis Date Noted  . Migraine 08/18/2017  . Iron deficiency anemia due to chronic blood loss 05/18/2017  . Vitamin D deficiency 04/27/2017  . HLD (hyperlipidemia) 06/27/2016  . Gastroesophageal reflux disease with esophagitis 12/20/2014  . Fatty liver disease, nonalcoholic 12/11/2014  . Obstructive sleep apnea 09/28/2013  . Morbid obesity with BMI of 50.0-59.9, adult (HCC) 06/28/2013  . Esophageal dysmotility 06/28/2013  . Prediabetes 06/28/2013  . Leukocytosis 06/28/2013  . Low back pain radiating to both legs 06/28/2013  . Hip pain, bilateral 06/28/2013  . Essential hypertension, benign 11/04/2011  . Routine general medical examination at a health care facility 11/04/2011    Past Surgical History:  Procedure Laterality Date  . ABDOMINAL HYSTERECTOMY    . ABDOMINAL HYSTERECTOMY  2003   . CARDIOVASCULAR STRESS TEST  07/25/2007   Negative stress echo; ef 55-60%  . ESOPHAGEAL MANOMETRY  03/07/2012   Procedure: ESOPHAGEAL MANOMETRY (EM);  Surgeon: Mardella Laymanavid R Patterson, MD;  Location: [facility];  Service: Endoscopy;  Laterality: N/A;  . ESOPHAGOGASTRODUODENOSCOPY    . KNEE ARTHROSCOPY  01/2011   left  . KNEE SURGERY       OB History   No obstetric history on file.      Home Medications    Prior to Admission medications   Medication Sig Start Date End Date Taking? Authorizing Provider  Cholecalciferol 1.25 MG (50000 UT) capsule Take 1 capsule (50,000 Units total) by mouth once a week. 09/20/18   Etta GrandchildJones, Thomas L, MD  indapamide (LOZOL) 1.25 MG tablet Take 1 tablet (1.25 mg total) by mouth daily. 09/20/18   Etta GrandchildJones, Thomas L, MD  methocarbamol (ROBAXIN) 500 MG tablet Take 1 tablet (500 mg total) by mouth every 8 (eight) hours as needed for muscle spasms. 11/05/18   Benjiman CorePickering, Saphire Barnhart, MD  nebivolol (BYSTOLIC) 10 MG tablet Take 1 tablet (10 mg total) by mouth daily. 09/08/18   Etta GrandchildJones, Thomas L, MD  omeprazole (PRILOSEC) 40 MG capsule Take 1 capsule (40 mg total) by mouth daily. 06/28/18   Etta GrandchildJones, Thomas L, MD  traMADol (ULTRAM) 50 MG tablet TAKE 1 TABLET(50 MG) BY MOUTH EVERY 6 HOURS AS NEEDED 08/16/18  [name], MD  Ubrogepant (UBRELVY) 100 MG TABS Take 1 tablet by mouth daily as needed. 09/20/18   [name], MD    Family History Family History  Problem Relation Age of Onset  . Cervical cancer Mother   . Hypertension Sister   . Diabetes Sister   . Heart disease Neg Hx   . Cancer Neg Hx   . Alcohol abuse Neg Hx   . Early death Neg Hx   . Hearing loss Neg Hx   . Hyperlipidemia Neg Hx   . Kidney disease Neg Hx   . Stroke Neg Hx     Social History Social History   Tobacco Use  . Smoking status: Never Smoker  . Smokeless tobacco: Never Used  Substance Use Topics  . Alcohol use: No  . Drug use: No     Allergies   Oxycodone and Pneumococcal vaccines    Review of Systems Review of Systems  Constitutional: Positive for appetite change.  HENT: Negative for congestion.   Respiratory: Negative for shortness of breath.   Cardiovascular: Negative for chest pain.  Gastrointestinal: Positive for abdominal pain and nausea.  Genitourinary: Positive for flank pain. Negative for dysuria.  Musculoskeletal: Positive for back pain.  Skin: Negative for rash and wound.  Neurological: Negative for weakness.  Psychiatric/Behavioral: Negative for confusion.     Physical Exam Updated Vital Signs BP (!) 155/83 (BP Location: Right Arm)   Pulse 65   Temp 98.3 F (36.8 C) (Oral)   Resp 18   Ht 5\' 5"  (1.651 m)   Wt 122 kg   SpO2 100%   BMI 44.76 kg/m   Physical Exam Vitals signs and nursing note reviewed.  HENT:     Head: Normocephalic.  Eyes:     Pupils: Pupils are equal, round, and reactive to light.  Cardiovascular:     Rate and Rhythm: Normal rate.  Pulmonary:     Breath sounds: No wheezing, rhonchi or rales.  Chest:     Chest wall: No tenderness.  Abdominal:     Tenderness: There is abdominal tenderness.     Comments: Right upper quadrant tenderness no rebound or guarding.  No mass.  Genitourinary:    Comments: No CVA tenderness Skin:    General: Skin is warm.     Capillary Refill: Capillary refill takes less than 2 seconds.  Neurological:     Mental Status: She is alert. Mental status is at baseline.      ED Treatments / Results  Labs (all labs ordered are listed, but only abnormal results are displayed) Labs Reviewed  CBC WITH DIFFERENTIAL/PLATELET - Abnormal; Notable for the following components:      Result Value   WBC 11.3 (*)    All other components within normal limits  URINALYSIS, ROUTINE W REFLEX MICROSCOPIC - Abnormal; Notable for the following components:   Color, Urine STRAW (*)    Specific Gravity, Urine <1.005 (*)    All other components within normal limits  COMPREHENSIVE METABOLIC PANEL    EKG None   Radiology US Abdomen Limited Ruq  Result Date: 11/05/2018 CLINICAL DATA:  Right upper quadrant pain radiating to the back for the past 2 weeks. EXAM: ULTRASOUND ABDOMEN LIMITED RIGHT UPPER QUADRANT COMPARISON:  CT abdomen pelvis dated November 29, 2016. FINDINGS: Gallbladder: No gallstones or wall thickening visualized. No sonographic Murphy sign noted by sonographer. Common bile duct: Diameter: 3 mm, normal. Liver: No focal lesion identified. Within normal limits in parenchymal echogenicity.  Portal vein is patent on color Doppler imaging with normal direction of blood flow towards the liver. Other: None. IMPRESSION: Normal right upper quadrant ultrasound. Electronically Signed   By: Titus Dubin M.D.   On: 11/05/2018 11:05    Procedures Procedures (including critical care time)  Medications Ordered in ED Medications - No data to display   Initial Impression / Assessment and Plan / ED Course  I have reviewed the triage vital signs and the nursing notes.  Pertinent labs & imaging results that were available during my care of the patient were reviewed by me and considered in my medical decision making (see chart for details).        Patient with right-sided flank/abdominal pain.  Worse with movements.  Worse with palpation.  White count only mildly elevated labwork otherwise reassuring.  Urine does not show infection.  Ultrasound done due to mild upper quadrant tenderness.  Reassuring.  Also no hydronephrosis.  Stones felt less likely either renal or kidney.  Potentially could be musculoskeletal.  Discharge home with symptomatic treatment.  Outpatient follow-up as needed.  Final Clinical Impressions(s) / ED Diagnoses   Final diagnoses:  Right flank pain    ED Discharge Orders         Ordered    methocarbamol (ROBAXIN) 500 MG tablet  Every 8 hours PRN     11/05/18 1154           Davonna Belling, MD 11/05/18 1155

## 2018-12-03 ENCOUNTER — Other Ambulatory Visit: Payer: Self-pay | Admitting: Internal Medicine

## 2018-12-03 DIAGNOSIS — M545 Low back pain, unspecified: Secondary | ICD-10-CM

## 2018-12-03 DIAGNOSIS — M79604 Pain in right leg: Secondary | ICD-10-CM

## 2018-12-03 MED ORDER — METHOCARBAMOL 750 MG PO TABS: 750.0000 mg | ORAL_TABLET | Freq: Three times a day (TID) | ORAL | 2 refills | Status: DC | PRN

## 2018-12-20 ENCOUNTER — Other Ambulatory Visit: Payer: Self-pay | Admitting: Internal Medicine

## 2018-12-20 DIAGNOSIS — I10 Essential (primary) hypertension: Secondary | ICD-10-CM

## 2018-12-20 MED ORDER — INDAPAMIDE 1.25 MG PO TABS: 1.2500 mg | ORAL_TABLET | Freq: Every day | ORAL | 0 refills | Status: DC

## 2018-12-30 ENCOUNTER — Other Ambulatory Visit: Payer: Self-pay

## 2018-12-30 DIAGNOSIS — I10 Essential (primary) hypertension: Secondary | ICD-10-CM

## 2018-12-30 MED ORDER — NEBIVOLOL HCL 10 MG PO TABS: 10.0000 mg | ORAL_TABLET | Freq: Every day | ORAL | 0 refills | Status: DC

## 2019-02-06 ENCOUNTER — Other Ambulatory Visit: Payer: Self-pay | Admitting: Internal Medicine

## 2019-02-06 DIAGNOSIS — M79604 Pain in right leg: Secondary | ICD-10-CM

## 2019-02-06 DIAGNOSIS — M545 Low back pain: Secondary | ICD-10-CM

## 2019-02-06 DIAGNOSIS — M25551 Pain in right hip: Secondary | ICD-10-CM

## 2019-02-06 DIAGNOSIS — M79605 Pain in left leg: Secondary | ICD-10-CM

## 2019-02-06 MED ORDER — TRAMADOL HCL 50 MG PO TABS: ORAL_TABLET | ORAL | 1 refills | Status: DC

## 2019-03-13 ENCOUNTER — Other Ambulatory Visit: Payer: Self-pay | Admitting: Internal Medicine

## 2019-03-13 DIAGNOSIS — K2100 Gastro-esophageal reflux disease with esophagitis, without bleeding: Secondary | ICD-10-CM

## 2019-03-13 MED ORDER — OMEPRAZOLE 40 MG PO CPDR: 40.0000 mg | DELAYED_RELEASE_CAPSULE | Freq: Every day | ORAL | 1 refills | Status: DC

## 2019-03-26 ENCOUNTER — Other Ambulatory Visit: Payer: Self-pay | Admitting: Internal Medicine

## 2019-03-26 DIAGNOSIS — I10 Essential (primary) hypertension: Secondary | ICD-10-CM

## 2019-04-07 ENCOUNTER — Other Ambulatory Visit: Payer: Self-pay | Admitting: Internal Medicine

## 2019-04-07 DIAGNOSIS — E559 Vitamin D deficiency, unspecified: Secondary | ICD-10-CM

## 2019-05-17 ENCOUNTER — Encounter: Payer: Self-pay | Admitting: Internal Medicine

## 2019-05-17 ENCOUNTER — Ambulatory Visit: Payer: PRIVATE HEALTH INSURANCE | Admitting: Internal Medicine

## 2019-05-17 VITALS — BP 138/90 | HR 80 | Temp 98.2°F | Ht 65.0 in | Wt 277.0 lb

## 2019-05-17 DIAGNOSIS — K2100 Gastro-esophageal reflux disease with esophagitis, without bleeding: Secondary | ICD-10-CM | POA: Diagnosis not present

## 2019-05-17 DIAGNOSIS — K224 Dyskinesia of esophagus: Secondary | ICD-10-CM | POA: Diagnosis not present

## 2019-05-17 DIAGNOSIS — K225 Diverticulum of esophagus, acquired: Secondary | ICD-10-CM | POA: Diagnosis not present

## 2019-05-17 NOTE — Progress Notes (Signed)
 [name] 54 y.o. 06/17/1965 [id number]  Assessment & Plan:   Encounter Diagnoses  Name Primary?  . Esophageal dysmotility Yes  . Gastroesophageal reflux disease with esophagitis without hemorrhage   . Esophageal diverticulum, acquired    Plan is to go ahead and have an endoscopically placed manometry probe and manometry study.  The risks and benefits as well as alternatives of endoscopic procedure(s) have been discussed and reviewed. All questions answered. The patient agrees to proceed.  Weight has been increasing and that certainly may be part of the issue as well but she has an esophageal dysmotility and if that is involved more towards an achalasia we could offer more specific therapy    Subjective:   Chief Complaint: Reflux  HPI [name] is here complaining of persistent intermittent acid regurgitation off and on often when she is lying down.  She works third shift so she sleeps during the day.  She remains on PPI previously on Dexilant now on omeprazole but still has these problems intermittently.  When last seen in 2019 I had reviewed her situation, esophageal dysmotility and GERD and esophageal diverticulum with Dr. Nandigam the thought was it would make sense to restudy her but the use an endoscope to place her manometry probe.  That way we would avoid entering the diverticulum inadvertently and causing a complication.  She used to have dysphagia but dilation of the GE junction with a 20 mm balloon has eliminated that.  Cologuard was negative within the past year Re: Colon cancer screening  Wt Readings from Last 3 Encounters:  05/17/19 277 lb (125.6 kg)  11/05/18 269 lb (122 kg)  10/06/18 269 lb (122 kg)    Allergies  Allergen Reactions  . Oxycodone Shortness Of Breath  . Pneumococcal Vaccines Itching and Swelling    Swelling caused by prevnar (06/22/2016).   Current Meds  Medication Sig  . BYSTOLIC 10 MG tablet TAKE 1 TABLET(10 MG) BY MOUTH DAILY  .  Cholecalciferol (VITAMIN D3) 1.25 MG (50000 UT) CAPS TAKE 1 CAPSULE BY MOUTH 1 TIME A WEEK  . indapamide (LOZOL) 1.25 MG tablet Take 1 tablet (1.25 mg total) by mouth daily.  . methocarbamol (ROBAXIN-750) 750 MG tablet Take 1 tablet (750 mg total) by mouth every 8 (eight) hours as needed for muscle spasms.  . omeprazole (PRILOSEC) 40 MG capsule Take 1 capsule (40 mg total) by mouth daily.  . traMADol (ULTRAM) 50 MG tablet TAKE 1 TABLET(50 MG) BY MOUTH EVERY 6 HOURS AS NEEDED  . Ubrogepant (UBRELVY) 100 MG TABS Take 1 tablet by mouth daily as needed.   Past Medical History:  Diagnosis Date  . Arthritis   . Esophageal stenosis   . Esophagitis   . GERD (gastroesophageal reflux disease)   . Hiatal hernia   . Hypertension   . Iron deficiency anemia   . Migraines   . Morbid obesity (HCC)   . Sleep apnea        Past Surgical History:  Procedure Laterality Date  . ABDOMINAL HYSTERECTOMY    . ABDOMINAL HYSTERECTOMY  2003  . CARDIOVASCULAR STRESS TEST  07/25/2007   Negative stress echo; ef 55-60%  . ESOPHAGEAL MANOMETRY  03/07/2012   Procedure: ESOPHAGEAL MANOMETRY (EM);  Surgeon: David R Patterson, MD;  Location: [facility];  Service: Endoscopy;  Laterality: N/A;  . ESOPHAGOGASTRODUODENOSCOPY    . ESOPHAGOGASTRODUODENOSCOPY    . KNEE ARTHROSCOPY  01/2011   left  . 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   Review of Systems As above  Objective:   Physical Exam BP 138/90   Pulse 80   Temp 98.2 F (36.8 C)   Ht 5\' 5"  (1.651 m)   Wt 277 lb (125.6 kg)   BMI 46.10 kg/m  Lungs cta NL heart sounds

## 2019-05-17 NOTE — H&P (View-Only) (Signed)
Sayana Booton 54 y.o. [birth year] [id number]  Assessment & Plan:   Encounter Diagnoses  Name Primary?  . Esophageal dysmotility Yes  . Gastroesophageal reflux disease with esophagitis without hemorrhage   . Esophageal diverticulum, acquired    Plan is to go ahead and have an endoscopically placed manometry probe and manometry study.  The risks and benefits as well as alternatives of endoscopic procedure(s) have been discussed and reviewed. All questions answered. The patient agrees to proceed.  Weight has been increasing and that certainly may be part of the issue as well but she has an esophageal dysmotility and if that is involved more towards an achalasia we could offer more specific therapy    Subjective:   Chief Complaint: Reflux  HPI Afton is here complaining of persistent intermittent acid regurgitation off and on often when she is lying down.  She works third shift so she sleeps during the day.  She remains on PPI previously on Dexilant now on omeprazole but still has these problems intermittently.  When last seen in 2019 I had reviewed her situation, esophageal dysmotility and GERD and esophageal diverticulum with Dr. Silverio Decamp the thought was it would make sense to restudy her but the use an endoscope to place her manometry probe.  That way we would avoid entering the diverticulum inadvertently and causing a complication.  She used to have dysphagia but dilation of the GE junction with a 20 mm balloon has eliminated that.  Cologuard was negative within the past year Re: Colon cancer screening  Wt Readings from Last 3 Encounters:  05/17/19 277 lb (125.6 kg)  11/05/18 269 lb (122 kg)  10/06/18 269 lb (122 kg)    Allergies  Allergen Reactions  . Oxycodone Shortness Of Breath  . Pneumococcal Vaccines Itching and Swelling    Swelling caused by prevnar (06/22/2016).   Current Meds  Medication Sig  . BYSTOLIC 10 MG tablet TAKE 1 TABLET(10 MG) BY MOUTH DAILY  .  Cholecalciferol (VITAMIN D3) 1.25 MG (50000 UT) CAPS TAKE 1 CAPSULE BY MOUTH 1 TIME A WEEK  . indapamide (LOZOL) 1.25 MG tablet Take 1 tablet (1.25 mg total) by mouth daily.  . methocarbamol (ROBAXIN-750) 750 MG tablet Take 1 tablet (750 mg total) by mouth every 8 (eight) hours as needed for muscle spasms.  Marland Kitchen omeprazole (PRILOSEC) 40 MG capsule Take 1 capsule (40 mg total) by mouth daily.  . traMADol (ULTRAM) 50 MG tablet TAKE 1 TABLET(50 MG) BY MOUTH EVERY 6 HOURS AS NEEDED  . Ubrogepant (UBRELVY) 100 MG TABS Take 1 tablet by mouth daily as needed.   Past Medical History:  Diagnosis Date  . Arthritis   . Esophageal stenosis   . Esophagitis   . GERD (gastroesophageal reflux disease)   . Hiatal hernia   . Hypertension   . Iron deficiency anemia   . Migraines   . Morbid obesity ([place])   . Sleep apnea        Past Surgical History:  Procedure Laterality Date  . ABDOMINAL HYSTERECTOMY    . ABDOMINAL HYSTERECTOMY  2003  . CARDIOVASCULAR STRESS TEST  07/25/2007   Negative stress echo; ef 55-60%  . ESOPHAGEAL MANOMETRY  03/07/2012   Procedure: ESOPHAGEAL MANOMETRY (EM);  Surgeon: Sable Feil, MD;  Location: [facility];  Service: Endoscopy;  Laterality: N/A;  . ESOPHAGOGASTRODUODENOSCOPY    . ESOPHAGOGASTRODUODENOSCOPY    . KNEE ARTHROSCOPY  01/2011   left  . 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   Review of Systems As above  Objective:   Physical Exam BP 138/90   Pulse 80   Temp 98.2 F (36.8 C)   Ht 5\' 5"  (1.651 m)   Wt 277 lb (125.6 kg)   BMI 46.10 kg/m  Lungs cta NL heart sounds

## 2019-05-17 NOTE — Patient Instructions (Signed)
We will be in touch about setting up an EGD at the hospital.    I appreciate the opportunity to care for you. Stan Head, MD, [facility]

## 2019-05-18 ENCOUNTER — Telehealth: Payer: Self-pay

## 2019-05-18 NOTE — Telephone Encounter (Signed)
-----   Message from Gatha Mayer, MD sent at 05/18/2019 11:57 AM EST ----- Regarding: EGD and manometry So we need to set her up for an EGD w/ MAC to place the manometry probe  Dx  can be GERD, esophageal dysmotility   This will require calling endo at [facility] to explain that we need to do the EGD to get the mano probe in place and then she will wake up and do the mano  I suspect will need to do during my hospital week  If that is not an option find out when it is and we will figure something out  The reason we are doing it this way is because of the esophageal diverticulum

## 2019-05-18 NOTE — Telephone Encounter (Signed)
I have spoken to [name] and we will try to get her EGD/Mano set up for 06/07/2019 at [facility]. She works 3rd shift, from 11:00PM-7:00AM. She request I call her with information after 1:00pm.

## 2019-05-19 ENCOUNTER — Other Ambulatory Visit: Payer: Self-pay | Admitting: Internal Medicine

## 2019-05-19 DIAGNOSIS — K224 Dyskinesia of esophagus: Secondary | ICD-10-CM

## 2019-05-19 DIAGNOSIS — Q396 Congenital diverticulum of esophagus: Secondary | ICD-10-CM

## 2019-05-19 DIAGNOSIS — K2100 Gastro-esophageal reflux disease with esophagitis, without bleeding: Secondary | ICD-10-CM

## 2019-05-19 NOTE — Telephone Encounter (Signed)
Patient informed of date and time of EGD/MANO , April 1st at 10:00AM at [facility] END[facility], she will COVID test 06/05/2019 at 2:05pm. I will mail her instructions.

## 2019-05-23 ENCOUNTER — Emergency Department (HOSPITAL_BASED_OUTPATIENT_CLINIC_OR_DEPARTMENT_OTHER)
Admission: EM | Admit: 2019-05-23 | Discharge: 2019-05-23 | Disposition: A | Discharge status: Home / Self Care | Payer: PRIVATE HEALTH INSURANCE | Attending: Emergency Medicine | Admitting: Emergency Medicine

## 2019-05-23 ENCOUNTER — Encounter (HOSPITAL_BASED_OUTPATIENT_CLINIC_OR_DEPARTMENT_OTHER): Payer: Self-pay

## 2019-05-23 ENCOUNTER — Other Ambulatory Visit: Payer: Self-pay

## 2019-05-23 DIAGNOSIS — Z887 Allergy status to serum and vaccine status: Secondary | ICD-10-CM | POA: Insufficient documentation

## 2019-05-23 DIAGNOSIS — R42 Dizziness and giddiness: Secondary | ICD-10-CM | POA: Diagnosis not present

## 2019-05-23 DIAGNOSIS — I10 Essential (primary) hypertension: Secondary | ICD-10-CM | POA: Diagnosis not present

## 2019-05-23 DIAGNOSIS — Z79899 Other long term (current) drug therapy: Secondary | ICD-10-CM | POA: Insufficient documentation

## 2019-05-23 DIAGNOSIS — R7303 Prediabetes: Secondary | ICD-10-CM | POA: Diagnosis not present

## 2019-05-23 DIAGNOSIS — Z885 Allergy status to narcotic agent status: Secondary | ICD-10-CM | POA: Diagnosis not present

## 2019-05-23 LAB — COMPREHENSIVE METABOLIC PANEL
ALT: 11 U/L (ref 0–44)
AST: 16 U/L (ref 15–41)
Albumin: 3.7 g/dL (ref 3.5–5.0)
Alkaline Phosphatase: 81 U/L (ref 38–126)
Anion gap: 7 (ref 5–15)
BUN: 12 mg/dL (ref 6–20)
CO2: 27 mmol/L (ref 22–32)
Calcium: 9.2 mg/dL (ref 8.9–10.3)
Chloride: 105 mmol/L (ref 98–111)
Creatinine, Ser: 0.63 mg/dL (ref 0.44–1.00)
GFR calc Af Amer: >60 mL/min (ref >60)
GFR calc non Af Amer: >60 mL/min (ref >60)
Glucose, Bld: 94 mg/dL (ref 70–99)
Potassium: 3.9 mmol/L (ref 3.5–5.1)
Sodium: 139 mmol/L (ref 135–145)
Total Bilirubin: 0.4 mg/dL (ref 0.3–1.2)
Total Protein: 7.8 g/dL (ref 6.5–8.1)

## 2019-05-23 LAB — CBC
HCT: 41.8 % (ref 36.0–46.0)
Hemoglobin: 13 g/dL (ref 12.0–15.0)
MCH: 27.8 pg (ref 26.0–34.0)
MCHC: 31.1 g/dL (ref 30.0–36.0)
MCV: 89.5 fL (ref 80.0–100.0)
Platelets: 280 10*3/uL (ref 150–400)
RBC: 4.67 MIL/uL (ref 3.87–5.11)
RDW: 13.6 % (ref 11.5–15.5)
WBC: 11.1 10*3/uL — ABNORMAL HIGH (ref 4.0–10.5)
nRBC: 0 % (ref 0.0–0.2)

## 2019-05-23 MED ORDER — MECLIZINE HCL 25 MG PO TABS: 25.0000 mg | ORAL_TABLET | Freq: Three times a day (TID) | ORAL | 0 refills | Status: DC | PRN

## 2019-05-23 MED ORDER — MECLIZINE HCL 25 MG PO TABS
12.5000 mg | ORAL_TABLET | Freq: Once | ORAL | Status: AC
  Administered 2019-05-23: 12.5 mg via ORAL
  Filled 2019-05-23: qty 1

## 2019-05-23 NOTE — ED Notes (Signed)
Pt reports dizziness resolved.

## 2019-05-23 NOTE — ED Notes (Signed)
Water provided to pt.

## 2019-05-23 NOTE — Discharge Instructions (Addendum)
It was our pleasure to provide your ER care today - we hope that you feel better.  Rest. Drink plenty of fluids.  Take antivert as need for dizziness - this medication does cause drowsiness - no driving when taking, or if/when feeling dizzy.  Follow up with primary care doctor in 1 week if symptoms fail to improve/resolve.  Return to ER if worse, new symptoms, weak/fainting, severe dizziness, numbness/weakness, change in speech or vision, problems with balance or coordination, or other concern.

## 2019-05-23 NOTE — ED Provider Notes (Signed)
[facility] EMERGENCY DEPARTMENT Provider Note   CSN: [id number] Arrival date & time: 05/23/19  1116     History Chief Complaint  Patient presents with  . Dizziness    [name] is a 54 y.o. female.  Patient c/o feeling dizzy. 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 States has hx vertigo, and noted feeling 'dizzy'. Describes as both a lightheaded/faint feeling, as well as mild room spinning sensation. Worse w standing and head movement. No tinnitus, ear pain or hearing loss. No recent sinus congestion or allergy symptoms. No cough or sore throat. Denies headache. No facial or extremity numbness or weakness. No change in speech or vision. No falls or problems walking. No recent new meds or change in meds. No recent blood loss, heavy vaginal bleeding, or rectal bleeding/melena. No fever or chills.   The history is provided by the patient.  Dizziness Associated symptoms: no chest pain, no headaches, no hearing loss, no nausea, no shortness of breath, no tinnitus and no vomiting        Past Medical History:  Diagnosis Date  . Arthritis   . COVID-19 06/2018  . Esophageal stenosis   . Esophagitis   . GERD (gastroesophageal reflux disease)   . Hiatal hernia   . Hypertension   . Iron deficiency anemia   . Migraines   . Morbid obesity (HCC)   . Sleep apnea         Patient Active Problem List   Diagnosis Date Noted  . Migraine 08/18/2017  . Iron deficiency anemia due to chronic blood loss 05/18/2017  . Vitamin D deficiency 04/27/2017  . HLD (hyperlipidemia) 06/27/2016  . Gastroesophageal reflux disease with esophagitis 12/20/2014  . Fatty liver disease, nonalcoholic 12/11/2014  . Obstructive sleep apnea 09/28/2013  . Morbid obesity with BMI of 50.0-59.9, adult (HCC) 06/28/2013  . Esophageal dysmotility 06/28/2013  . Prediabetes 06/28/2013  . Leukocytosis 06/28/2013  . Low back pain radiating to both legs 06/28/2013  . Hip pain, bilateral  06/28/2013  . Essential hypertension, benign 11/04/2011  . Routine general medical examination at a health care facility 11/04/2011    Past Surgical History:  Procedure Laterality Date  . ABDOMINAL HYSTERECTOMY    . ABDOMINAL HYSTERECTOMY  2003  . CARDIOVASCULAR STRESS TEST  07/25/2007   Negative stress echo; ef 55-60%  . ESOPHAGEAL MANOMETRY  03/07/2012   Procedure: ESOPHAGEAL MANOMETRY (EM);  Surgeon: Mardella Layman, MD;  Location: [facility];  Service: Endoscopy;  Laterality: N/A;  . ESOPHAGOGASTRODUODENOSCOPY    . ESOPHAGOGASTRODUODENOSCOPY    . KNEE ARTHROSCOPY  01/2011   left  . KNEE SURGERY       OB History   No obstetric history on file.     Family History  Problem Relation Age of Onset  . Cervical cancer Mother   . Hypertension Sister   . Diabetes Sister   . Heart disease Neg Hx   . Cancer Neg Hx   . Alcohol abuse Neg Hx   . Early death Neg Hx   . Hearing loss Neg Hx   . Hyperlipidemia Neg Hx   . Kidney disease Neg Hx   . Stroke Neg Hx     Social History   Tobacco Use  . Smoking status: Never Smoker  . Smokeless tobacco: Never Used  Substance Use Topics  . Alcohol use: No  . Drug use: No    Home Medications Prior to Admission medications   Medication Sig Start Date End Date  Taking? Authorizing Provider  BYSTOLIC 10 MG tablet TAKE 1 TABLET(10 MG) BY MOUTH DAILY 03/26/19   [name], MD  Cholecalciferol (VITAMIN D3) 1.25 MG (50000 UT) CAPS TAKE 1 CAPSULE BY MOUTH 1 TIME A WEEK 04/07/19   [name], MD  indapamide (LOZOL) 1.25 MG tablet Take 1 tablet (1.25 mg total) by mouth daily. 12/20/18   [name], MD  methocarbamol (ROBAXIN-750) 750 MG tablet Take 1 tablet (750 mg total) by mouth every 8 (eight) hours as needed for muscle spasms. 12/03/18   [name], MD  omeprazole (PRILOSEC) 40 MG capsule Take 1 capsule (40 mg total) by mouth daily. 03/13/19   [name], MD  traMADol (ULTRAM) 50 MG tablet TAKE 1 TABLET(50 MG) BY  MOUTH EVERY 6 HOURS AS NEEDED 02/06/19   [name], MD  Ubrogepant (UBRELVY) 100 MG TABS Take 1 tablet by mouth daily as needed. 09/20/18   [name], MD  Cholecalciferol 1.25 MG (50000 UT) capsule Take 1 capsule (50,000 Units total) by mouth once a week. 09/20/18   [name], MD    Allergies    Oxycodone and Pneumococcal vaccines  Review of Systems   Review of Systems  Constitutional: Negative for chills and fever.  HENT: Negative for congestion, ear pain, hearing loss, sore throat and tinnitus.   Eyes: Negative for visual disturbance.  Respiratory: Negative for cough and shortness of breath.   Cardiovascular: Negative for chest pain.  Gastrointestinal: Negative for abdominal pain, nausea and vomiting.  Genitourinary: Negative for dysuria and flank pain.  Musculoskeletal: Negative for neck pain.  Skin: Negative for rash.  Neurological: Positive for dizziness and light-headedness. Negative for speech difficulty, numbness and headaches.  Hematological: Does not bruise/bleed easily.  Psychiatric/Behavioral: Negative for confusion.    Physical Exam Updated Vital Signs BP (!) 146/78 (BP Location: Right Wrist)   Pulse 66   Temp 98.3 F (36.8 C) (Oral)   Resp 20   Ht 1.651 m (5\' 5" )   Wt 122.5 kg   SpO2 99%   BMI 44.93 kg/m   Physical Exam Vitals and nursing note reviewed.  Constitutional:      Appearance: Normal appearance. She is well-developed.  HENT:     Head: Atraumatic.     Comments: No focal sts or tenderness. No sinus drainage, pain or tenderness. No mastoid tenderness.     Right Ear: Tympanic membrane, ear canal and external ear normal.     Left Ear: Tympanic membrane, ear canal and external ear normal.     Nose: Nose normal.     Mouth/Throat:     Mouth: Mucous membranes are moist.  Eyes:     General: No scleral icterus.    Conjunctiva/sclera: Conjunctivae normal.     Pupils: Pupils are equal, round, and reactive to light.  Neck:     Vascular:  No carotid bruit.     Trachea: No tracheal deviation.  Cardiovascular:     Rate and Rhythm: Normal rate and regular rhythm.     Pulses: Normal pulses.     Heart sounds: Normal heart sounds. No murmur. No friction rub. No gallop.   Pulmonary:     Effort: Pulmonary effort is normal. No respiratory distress.     Breath sounds: Normal breath sounds.  Abdominal:     General: Bowel sounds are normal. There is no distension.     Palpations: Abdomen is soft.     Tenderness: There is no abdominal tenderness.  Genitourinary:    Comments: No cva tenderness.  Musculoskeletal:        General: No swelling.     Cervical back: Normal range of motion and neck supple. No rigidity. No muscular tenderness.  Skin:    General: Skin is warm and dry.     Findings: No rash.  Neurological:     Mental Status: She is alert.     Cranial Nerves: No cranial nerve deficit.     Comments: Alert, speech normal/fluent. No facial asymmetry or weakness. Motor intact bil, stre 5/5. No pronator drift. Sensation grossly intact. Steady gait, no ataxia.   Psychiatric:        Mood and Affect: Mood normal.     ED Results / Procedures / Treatments   Labs (all labs ordered are listed, but only abnormal results are displayed) Results for orders placed or performed during the hospital encounter of 05/23/19  Comprehensive metabolic panel  Result Value Ref Range   Sodium 139 135 - 145 mmol/L   Potassium 3.9 3.5 - 5.1 mmol/L   Chloride 105 98 - 111 mmol/L   CO2 27 22 - 32 mmol/L   Glucose, Bld 94 70 - 99 mg/dL   BUN 12 6 - 20 mg/dL   Creatinine, Ser 0.63 0.44 - 1.00 mg/dL   Calcium 9.2 8.9 - 10.3 mg/dL   Total Protein 7.8 6.5 - 8.1 g/dL   Albumin 3.7 3.5 - 5.0 g/dL   AST 16 15 - 41 U/L   ALT 11 0 - 44 U/L   Alkaline Phosphatase 81 38 - 126 U/L   Total Bilirubin 0.4 0.3 - 1.2 mg/dL   GFR calc non Af Amer >60 >60 mL/min   GFR calc Af Amer >60 >60 mL/min   Anion gap 7 5 - 15  CBC  Result Value Ref Range   WBC 11.1  (H) 4.0 - 10.5 K/uL   RBC 4.67 3.87 - 5.11 MIL/uL   Hemoglobin 13.0 12.0 - 15.0 g/dL   HCT 41.8 36.0 - 46.0 %   MCV 89.5 80.0 - 100.0 fL   MCH 27.8 26.0 - 34.0 pg   MCHC 31.1 30.0 - 36.0 g/dL   RDW 13.6 11.5 - 15.5 %   Platelets 280 150 - 400 K/uL   nRBC 0.0 0.0 - 0.2 %   EKG EKG Interpretation  Date/Time:  Tuesday May 23 2019 12:17:08 EDT Ventricular Rate:  65 PR Interval:    QRS Duration: 95 QT Interval:  429 QTC Calculation: 447 R Axis:   61 Text Interpretation: Sinus rhythm No significant change since last tracing Confirmed by Lajean Saver [phone]) on 05/23/2019 12:43:01 PM   Radiology No results found.  Procedures Procedures (including critical care time)  Medications Ordered in ED Medications  meclizine (ANTIVERT) tablet 12.5 mg (has no administration in time range)    ED Course  I have reviewed the triage vital signs and the nursing notes.  Pertinent labs & imaging results that were available during my care of the patient were reviewed by me and considered in my medical decision making (see chart for details).    MDM Rules/Calculators/A&P                     Labs sent.   Reviewed nursing notes and prior charts for additional history.   Antivert po.  Labs reviewed/interpreted by me - chem normal, hgb normal.   Ambulate pt in hall.   Po fluids.  Pt tolerating po, no  nv. Ambulates w stead gait.  Patient currently appears stable for d/c.       Final Clinical Impression(s) / ED Diagnoses Final diagnoses:  None    Rx / DC Orders ED Discharge Orders    None       Cathren Laine, MD 05/23/19 1245

## 2019-05-23 NOTE — ED Triage Notes (Signed)
Pt arrives ambulatory to ED With steady gait with c/o dizziness starting last night while working 3rd shift pt also reports some nausea with episode. States that she took a meclizine which helped her dizziness some but states that when she showered and laid down the dizziness came back.

## 2019-05-23 NOTE — ED Notes (Signed)
PT ambulated in hall w/o difficulty.

## 2019-06-05 ENCOUNTER — Other Ambulatory Visit (HOSPITAL_COMMUNITY)
Admission: RE | Admit: 2019-06-05 | Discharge: 2019-06-05 | Disposition: A | Discharge status: Home / Self Care | Payer: PRIVATE HEALTH INSURANCE | Source: Ambulatory Visit | Attending: Internal Medicine | Admitting: Internal Medicine

## 2019-06-05 DIAGNOSIS — Z20822 Contact with and (suspected) exposure to covid-19: Secondary | ICD-10-CM | POA: Insufficient documentation

## 2019-06-05 DIAGNOSIS — Z01812 Encounter for preprocedural laboratory examination: Secondary | ICD-10-CM | POA: Diagnosis not present

## 2019-06-05 LAB — SARS CORONAVIRUS 2 (TAT 6-24 HRS): SARS Coronavirus 2: NEGATIVE

## 2019-06-08 ENCOUNTER — Other Ambulatory Visit: Payer: Self-pay

## 2019-06-08 ENCOUNTER — Ambulatory Visit (HOSPITAL_COMMUNITY)
Admission: RE | Admit: 2019-06-08 | Discharge: 2019-06-08 | Disposition: A | Discharge status: Home / Self Care | Payer: PRIVATE HEALTH INSURANCE | Attending: Internal Medicine | Admitting: Internal Medicine

## 2019-06-08 ENCOUNTER — Encounter: Payer: Self-pay | Admitting: *Deleted

## 2019-06-08 ENCOUNTER — Ambulatory Visit (HOSPITAL_COMMUNITY): Payer: PRIVATE HEALTH INSURANCE | Admitting: Registered Nurse

## 2019-06-08 ENCOUNTER — Encounter (HOSPITAL_COMMUNITY)
Admission: RE | Disposition: A | Discharge status: Home / Self Care | Payer: Self-pay | Source: Home / Self Care | Attending: Internal Medicine

## 2019-06-08 DIAGNOSIS — Z887 Allergy status to serum and vaccine status: Secondary | ICD-10-CM | POA: Insufficient documentation

## 2019-06-08 DIAGNOSIS — G4730 Sleep apnea, unspecified: Secondary | ICD-10-CM | POA: Diagnosis not present

## 2019-06-08 DIAGNOSIS — Z6841 Body Mass Index (BMI) 40.0 and over, adult: Secondary | ICD-10-CM | POA: Insufficient documentation

## 2019-06-08 DIAGNOSIS — Q396 Congenital diverticulum of esophagus: Secondary | ICD-10-CM | POA: Diagnosis present

## 2019-06-08 DIAGNOSIS — K2100 Gastro-esophageal reflux disease with esophagitis, without bleeding: Secondary | ICD-10-CM

## 2019-06-08 DIAGNOSIS — M1990 Unspecified osteoarthritis, unspecified site: Secondary | ICD-10-CM | POA: Diagnosis not present

## 2019-06-08 DIAGNOSIS — Z79899 Other long term (current) drug therapy: Secondary | ICD-10-CM | POA: Diagnosis not present

## 2019-06-08 DIAGNOSIS — R1310 Dysphagia, unspecified: Secondary | ICD-10-CM | POA: Diagnosis not present

## 2019-06-08 DIAGNOSIS — K224 Dyskinesia of esophagus: Secondary | ICD-10-CM

## 2019-06-08 DIAGNOSIS — I10 Essential (primary) hypertension: Secondary | ICD-10-CM | POA: Insufficient documentation

## 2019-06-08 DIAGNOSIS — Z885 Allergy status to narcotic agent status: Secondary | ICD-10-CM | POA: Diagnosis not present

## 2019-06-08 HISTORY — PX: ESOPHAGEAL MANOMETRY: SHX5429

## 2019-06-08 HISTORY — PX: ESOPHAGOGASTRODUODENOSCOPY (EGD) WITH PROPOFOL: SHX5813

## 2019-06-08 SURGERY — MANOMETRY, ESOPHAGUS

## 2019-06-08 SURGERY — ESOPHAGOGASTRODUODENOSCOPY (EGD) WITH PROPOFOL
Anesthesia: Monitor Anesthesia Care

## 2019-06-08 MED ORDER — SODIUM CHLORIDE 0.9 % IV SOLN: INTRAVENOUS | Status: DC

## 2019-06-08 MED ORDER — PROPOFOL 500 MG/50ML IV EMUL
INTRAVENOUS | Status: AC
  Filled 2019-06-08: qty 50

## 2019-06-08 MED ORDER — PROPOFOL 10 MG/ML IV BOLUS
INTRAVENOUS | Status: AC
  Filled 2019-06-08: qty 20

## 2019-06-08 MED ORDER — PROPOFOL 500 MG/50ML IV EMUL
INTRAVENOUS | Status: DC | PRN
  Administered 2019-06-08: 20 mg via INTRAVENOUS
  Administered 2019-06-08: 10 mg via INTRAVENOUS
  Administered 2019-06-08: 140 ug/kg/min via INTRAVENOUS

## 2019-06-08 MED ORDER — LACTATED RINGERS IV SOLN
INTRAVENOUS | Status: AC | PRN
  Administered 2019-06-08: 1000 mL via INTRAVENOUS

## 2019-06-08 MED ORDER — ONDANSETRON HCL 4 MG/2ML IJ SOLN
INTRAMUSCULAR | Status: DC | PRN
  Administered 2019-06-08: 4 mg via INTRAVENOUS

## 2019-06-08 SURGICAL SUPPLY — 15 items

## 2019-06-08 SURGICAL SUPPLY — 2 items
FACESHIELD LNG OPTICON STERILE (SAFETY) IMPLANT
GLOVE BIO SURGEON STRL SZ8 (GLOVE) ×4 IMPLANT

## 2019-06-08 NOTE — Anesthesia Preprocedure Evaluation (Signed)
Anesthesia Evaluation  Patient identified by MRN, date of birth, ID band Patient awake    Reviewed: Allergy & Precautions, H&P , NPO status , Patient's Chart, lab work & pertinent test results  Airway Mallampati: II   Neck ROM: full    Dental   Pulmonary sleep apnea ,    breath sounds clear to auscultation       Cardiovascular hypertension,  Rhythm:regular Rate:Normal     Neuro/Psych  Headaches,    GI/Hepatic hiatal hernia, GERD  ,Esophageal stenosis   Endo/Other    Renal/GU      Musculoskeletal  (+) Arthritis ,   Abdominal   Peds  Hematology   Anesthesia Other Findings   Reproductive/Obstetrics                             Anesthesia Physical Anesthesia Plan  ASA: II  Anesthesia Plan: MAC   Post-op Pain Management:    Induction: Intravenous  PONV Risk Score and Plan: 2 and Propofol infusion and Treatment may vary due to age or medical condition  Airway Management Planned: Nasal Cannula  Additional Equipment:   Intra-op Plan:   Post-operative Plan:   Informed Consent: I have reviewed the patients History and Physical, chart, labs and discussed the procedure including the risks, benefits and alternatives for the proposed anesthesia with the patient or authorized representative who has indicated his/her understanding and acceptance.       Plan Discussed with: CRNA, Anesthesiologist and Surgeon  Anesthesia Plan Comments:         Anesthesia Quick Evaluation

## 2019-06-08 NOTE — Discharge Instructions (Signed)
YOU HAD AN ENDOSCOPIC PROCEDURE TODAY: Refer to the procedure report and other information in the discharge instructions given to you for any specific questions about what was found during the examination. If this information does not answer your questions, please call Dr. Ronnel Zuercher's office at [phone] to clarify.   YOU SHOULD EXPECT: Some feelings of bloating in the abdomen. Passage of more gas than usual. Walking can help get rid of the air that was put into your GI tract during the procedure and reduce the bloating. If you had a lower endoscopy (such as a colonoscopy or flexible sigmoidoscopy) you may notice spotting of blood in your stool or on the toilet paper. Some abdominal soreness may be present for a day or two, also.  DIET: Your first meal following the procedure should be a light meal and then it is ok to progress to your normal diet. A half-sandwich or bowl of soup is an example of a good first meal. Heavy or fried foods are harder to digest and may make you feel nauseous or bloated. Drink plenty of fluids but you should avoid alcoholic beverages for 24 hours.   ACTIVITY: Your care partner should take you home directly after the procedure. You should plan to take it easy, moving slowly for the rest of the day. You can resume normal activity the day after the procedure however YOU SHOULD NOT DRIVE, use power tools, machinery or perform tasks that involve climbing or major physical exertion for 24 hours (because of the sedation medicines used during the test).   SYMPTOMS TO REPORT IMMEDIATELY: A gastroenterologist can be reached at any hour. Please call [phone]  for any of the following symptoms:  Following lower endoscopy (colonoscopy, flexible sigmoidoscopy) Excessive amounts of blood in the stool  Significant tenderness, worsening of abdominal pains  Swelling of the abdomen that is new, acute  Fever of 100 or higher  Following upper endoscopy (EGD, EUS, ERCP, esophageal  dilation) Vomiting of blood or coffee ground material  New, significant abdominal pain  New, significant chest pain or pain under the shoulder blades  Painful or persistently difficult swallowing  New shortness of breath  Black, tarry-looking or red, bloody stools  FOLLOW UP:  If any biopsies were taken you will be contacted by phone or by letter within the next 1-3 weeks. Call [phone]  if you have not heard about the biopsies in 3 weeks.  Please also call with any specific questions about appointments or follow up tests. 

## 2019-06-08 NOTE — Interval H&P Note (Signed)
History and Physical Interval Note:  06/08/2019 9:39 AM  [name]  has presented today for surgery, with the diagnosis of GERD, esophagael dysmotily.  The various methods of treatment have been discussed with the patient and family. After consideration of risks, benefits and other options for treatment, the patient has consented to  Procedure(s): ESOPHAGOGASTRODUODENOSCOPY (EGD) WITH PROPOFOL (N/A) as a surgical intervention.  The patient's history has been reviewed, patient examined, no change in status, stable for surgery.  I have reviewed the patient's chart and labs.  Questions were answered to the patient's satisfaction.     Stan Head

## 2019-06-08 NOTE — Op Note (Signed)
[facility] Patient Name: [name] Procedure Date: 06/08/2019 MRN: [id number] Attending MD: Iva Boop , MD Date of Birth: 12-May-1965 CSN: [id number] Age: 54 Admit Type: Outpatient Procedure:                Upper GI endoscopy Indications:              Dysphagia, place manometry probe Providers:                Iva Boop, MD, Dwain Sarna, RN, Lawson Radar, Technician, Lauree Chandler. [place], CRNA Referring MD:              Medicines:                Propofol per Anesthesia, Monitored Anesthesia Care Complications:            No immediate complications. Estimated Blood Loss:     Estimated blood loss: none. Procedure:                Pre-Anesthesia Assessment:                           - Prior to the procedure, a History and Physical                            was performed, and patient medications and                            allergies were reviewed. The patient's tolerance of                            previous anesthesia was also reviewed. The risks                            and benefits of the procedure and the sedation                            options and risks were discussed with the patient.                            All questions were answered, and informed consent                            was obtained. Prior Anticoagulants: The patient has                            taken no previous anticoagulant or antiplatelet                            agents. ASA Grade Assessment: II - A patient with                            mild systemic disease. After reviewing the risks  and benefits, the patient was deemed in                            satisfactory condition to undergo the procedure.                           After obtaining informed consent, the endoscope was                            passed under direct vision. Throughout the                            procedure, the patient's blood pressure,  pulse, and                            oxygen saturations were monitored continuously. The                            GIF-H190 ([id number] Olympus gastroscope was                            introduced through the mouth, and advanced to the                            second part of duodenum. The upper GI endoscopy was                            accomplished without difficulty. The patient                            tolerated the procedure well. Scope In: Scope Out: Findings:      A diverticulum with a small opening was found in the lower third of the       esophagus.      The exam was otherwise without abnormality.      The cardia and gastric fundus were normal on retroflexion. Impression:               - Diverticulum in the lower third of the esophagus.                           - The examination was otherwise normal.                           - No specimens collected. the manometry probe was                            safely passed into stomach under direct vision -                            avoiding the diverticulum Moderate Sedation:      Not Applicable - Patient had care per Anesthesia. Recommendation:           - Patient has a contact number available for  emergencies. The signs and symptoms of potential                            delayed complications were discussed with the                            patient. Return to normal activities tomorrow.                            Written discharge instructions were provided to the                            patient.                           - Resume previous diet.                           - Continue present medications.                           - Await manometry results Procedure Code(s):        --- Professional ---                           [phone], Esophagogastroduodenoscopy, flexible,                            transoral; diagnostic, including collection of                            specimen(s) by  brushing or washing, when performed                            (separate procedure) Diagnosis Code(s):        --- Professional ---                           Q39.6, Congenital diverticulum of esophagus                           R13.10, Dysphagia, unspecified CPT copyright 2019 [employer]. All rights reserved. The codes documented in this report are preliminary and upon coder review may  be revised to meet current compliance requirements. Iva Boop, MD 06/08/2019 10:32:00 AM This report has been signed electronically. Number of Addenda: 0

## 2019-06-08 NOTE — Transfer of Care (Signed)
Immediate Anesthesia Transfer of Care Note  Patient: [name]  Procedure(s) Performed: ESOPHAGOGASTRODUODENOSCOPY (EGD) WITH PROPOFOL (N/A )  Patient Location: PACU and Endoscopy Unit  Anesthesia Type:MAC  Level of Consciousness: awake, alert , oriented and patient cooperative  Airway & Oxygen Therapy: Patient Spontanous Breathing and Patient connected to face mask oxygen  Post-op Assessment: Report given to RN, Post -op Vital signs reviewed and stable and Patient moving all extremities  Post vital signs: Reviewed and stable  Last Vitals:  Vitals Value Taken Time  BP    Temp    Pulse    Resp    SpO2      Last Pain:  Vitals:   06/08/19 0850  TempSrc: Oral  PainSc: 0-No pain         Complications: No apparent anesthesia complications

## 2019-06-08 NOTE — Progress Notes (Signed)
1100 Esophageal Manometry probe placed during EGD by Dr. Leone Payor due to esophageal diverticulum. Pt recovered from anesthesia and then esophageal manometry study done per protocol. Pt tolerated well. Pt then discharged as normal.

## 2019-06-12 NOTE — Anesthesia Postprocedure Evaluation (Signed)
Anesthesia Post Note  Patient: [name]  Procedure(s) Performed: ESOPHAGOGASTRODUODENOSCOPY (EGD) WITH PROPOFOL (N/A )     Patient location during evaluation: Endoscopy Anesthesia Type: MAC Level of consciousness: awake and alert Pain management: pain level controlled Vital Signs Assessment: post-procedure vital signs reviewed and stable Respiratory status: spontaneous breathing, nonlabored ventilation, respiratory function stable and patient connected to nasal cannula oxygen Cardiovascular status: blood pressure returned to baseline and stable Postop Assessment: no apparent nausea or vomiting Anesthetic complications: no    Last Vitals:  Vitals:   06/08/19 1044 06/08/19 1045  BP: (!) 148/79   Pulse: 62 65  Resp: 17 17  Temp:    SpO2: 99% 98%    Last Pain:  Vitals:   06/08/19 1044  TempSrc:   PainSc: 0-No pain                 Keyler Hoge S

## 2019-06-14 ENCOUNTER — Ambulatory Visit
Admission: EM | Admit: 2019-06-14 | Discharge: 2019-06-14 | Disposition: A | Discharge status: Home / Self Care | Payer: PRIVATE HEALTH INSURANCE | Attending: Physician Assistant | Admitting: Physician Assistant

## 2019-06-14 DIAGNOSIS — M546 Pain in thoracic spine: Secondary | ICD-10-CM

## 2019-06-14 DIAGNOSIS — R0789 Other chest pain: Secondary | ICD-10-CM | POA: Diagnosis not present

## 2019-06-14 MED ORDER — DICLOFENAC SODIUM 1 % EX GEL: 2.0000 g | Freq: Four times a day (QID) | CUTANEOUS | 0 refills | Status: DC

## 2019-06-14 NOTE — Discharge Instructions (Addendum)
No alarming signs on exam.  Your EKG was normal.  Continue Tylenol 1000 mg 3 times a day as needed for pain.  Continue Robaxin as needed.  You can supplement with Voltaren gel.  Ice/warm compress as needed.  Rest and avoid heavy lifting.  If you experience sudden worsening of chest pain, shortness of breath, fever, go to the emergency department for further evaluation.

## 2019-06-14 NOTE — ED Triage Notes (Signed)
Pt states had a endoscopy on Thursday and on Friday was cleaning, moving dressers, and heavy lifting. States since then having center chest pain through to back. States pain on movement. Denies pain radiating down lt arm or SOB. States pain is worse when stretching arms out.

## 2019-06-14 NOTE — ED Triage Notes (Signed)
States took tylenol last night and a robaxin this am with relief.

## 2019-06-14 NOTE — ED Provider Notes (Signed)
[facility]    CSN: [id number] Arrival date & time: 06/14/19  1411      History   Chief Complaint Chief Complaint  Patient presents with  . Chest Pain    HPI [name] is a 53 y.o. female.   54 year old female comes in for 6 day history of left sided chest and back pain. Had EGD 06/08/2019 without immediate complications. States had tolerated procedure well, and did not have significant pain or discomfort. Day after procedure, she did extensive cleaning with heavy lifting. Denies significant injury/trauma. States since then, has started experiencing pain. Has nausea without vomiting that is at baseline from GERD. Minimal cough while walking outside, this has resolved. Denies fever, chills. Denies shortness of breath. Denies palpitations, leg swelling. Denies neck pain, pain radiation down the arm, numbness/tingling. Pain can be exacerbated by arm movement.      Past Medical History:  Diagnosis Date  . Arthritis   . COVID-19 06/2018  . Esophageal stenosis   . Esophagitis   . GERD (gastroesophageal reflux disease)   . Hiatal hernia   . Hypertension   . Iron deficiency anemia   . Migraines   . Morbid obesity (HCC)   . Sleep apnea         Patient Active Problem List   Diagnosis Date Noted  . Esophageal diverticulum   . Migraine 08/18/2017  . Iron deficiency anemia due to chronic blood loss 05/18/2017  . Vitamin D deficiency 04/27/2017  . HLD (hyperlipidemia) 06/27/2016  . Gastroesophageal reflux disease with esophagitis 12/20/2014  . Fatty liver disease, nonalcoholic 12/11/2014  . Obstructive sleep apnea 09/28/2013  . Morbid obesity with BMI of 50.0-59.9, adult (HCC) 06/28/2013  . Esophageal dysmotility 06/28/2013  . Prediabetes 06/28/2013  . Leukocytosis 06/28/2013  . Low back pain radiating to both legs 06/28/2013  . Hip pain, bilateral 06/28/2013  . Essential hypertension, benign 11/04/2011  . Routine general medical examination at a health  care facility 11/04/2011    Past Surgical History:  Procedure Laterality Date  . ABDOMINAL HYSTERECTOMY    . ABDOMINAL HYSTERECTOMY  2003  . CARDIOVASCULAR STRESS TEST  07/25/2007   Negative stress echo; ef 55-60%  . ESOPHAGEAL MANOMETRY  03/07/2012   Procedure: ESOPHAGEAL MANOMETRY (EM);  Surgeon: Mardella Layman, MD;  Location: [facility];  Service: Endoscopy;  Laterality: N/A;  . ESOPHAGEAL MANOMETRY N/A 06/08/2019   Procedure: ESOPHAGEAL MANOMETRY (EM);  Surgeon: Iva Boop, MD;  Location: [facility];  Service: Endoscopy;  Laterality: N/A;  . ESOPHAGOGASTRODUODENOSCOPY    . ESOPHAGOGASTRODUODENOSCOPY    . ESOPHAGOGASTRODUODENOSCOPY (EGD) WITH PROPOFOL N/A 06/08/2019   Procedure: ESOPHAGOGASTRODUODENOSCOPY (EGD) WITH PROPOFOL;  Surgeon: Iva Boop, MD;  Location: [facility];  Service: Endoscopy;  Laterality: N/A;  . KNEE ARTHROSCOPY  01/2011   left  . KNEE SURGERY      OB History   No obstetric history on file.      Home Medications    Prior to Admission medications   Medication Sig Start Date End Date Taking? Authorizing Provider  acetaminophen (TYLENOL) 325 MG tablet Take 650 mg by mouth every 6 (six) hours as needed (for pain.).    [provider]  BYSTOLIC 10 MG tablet TAKE 1 TABLET(10 MG) BY MOUTH DAILY Patient taking differently: Take 10 mg by mouth daily at 2 am. (0400) 03/26/19   [name], MD  Cholecalciferol (VITAMIN D3) 1.25 MG (50000 UT) CAPS TAKE 1 CAPSULE BY MOUTH 1 TIME A  WEEK Patient taking differently: Take 50,000 Units by mouth every Monday.  04/07/19   [name], MD  diclofenac Sodium (VOLTAREN) 1 % GEL Apply 2 g topically 4 (four) times daily. 06/14/19   Cathie Hoops, Zakar Brosch V, PA-C  ibuprofen (ADVIL) 200 MG tablet Take 400 mg by mouth every 8 (eight) hours as needed (pain.).    [provider]  meclizine (ANTIVERT) 25 MG tablet Take 1 tablet (25 mg total) by mouth 3 (three) times daily as needed for dizziness. 05/23/19   Cathren Laine, MD  methocarbamol (ROBAXIN-750) 750 MG tablet Take 1 tablet (750 mg total) by mouth every 8 (eight) hours as needed for muscle spasms. 12/03/18   [name], MD  omeprazole (PRILOSEC) 40 MG capsule Take 1 capsule (40 mg total) by mouth daily. Patient taking differently: Take 40 mg by mouth daily at 2 am. (0400) 03/13/19   [name], MD  traMADol (ULTRAM) 50 MG tablet TAKE 1 TABLET(50 MG) BY MOUTH EVERY 6 HOURS AS NEEDED Patient taking differently: Take 50 mg by mouth every 6 (six) hours as needed (pain.).  02/06/19   [name], MD  Ubrogepant (UBRELVY) 100 MG TABS Take 1 tablet by mouth daily as needed. Patient not taking: Reported on 06/01/2019 09/20/18   [name], MD  Cholecalciferol 1.25 MG (50000 UT) capsule Take 1 capsule (50,000 Units total) by mouth once a week. 09/20/18   [name], MD    Family History Family History  Problem Relation Age of Onset  . Cervical cancer Mother   . Hypertension Sister   . Diabetes Sister   . Heart disease Neg Hx   . Cancer Neg Hx   . Alcohol abuse Neg Hx   . Early death Neg Hx   . Hearing loss Neg Hx   . Hyperlipidemia Neg Hx   . Kidney disease Neg Hx   . Stroke Neg Hx     Social History Social History   Tobacco Use  . Smoking status: Never Smoker  . Smokeless tobacco: Never Used  Substance Use Topics  . Alcohol use: No  . Drug use: No     Allergies   Oxycodone and Pneumococcal vaccines   Review of Systems Review of Systems  Reason unable to perform ROS: See HPI as above.     Physical Exam Triage Vital Signs ED Triage Vitals  Enc Vitals Group     BP 06/14/19 1416 (!) 145/84     Pulse Rate 06/14/19 1416 66     Resp 06/14/19 1416 20     Temp 06/14/19 1416 98.1 F (36.7 C)     Temp Source 06/14/19 1416 Oral     SpO2 06/14/19 1416 95 %     Weight --      Height --      Head Circumference --      Peak Flow --      Pain Score 06/14/19 1425 5     Pain Loc --      Pain Edu? --      Excl. in  GC? --    No data found.  Updated Vital Signs BP (!) 145/84 (BP Location: Left Arm)   Pulse 66   Temp 98.1 F (36.7 C) (Oral)   Resp 20   SpO2 95%   Visual Acuity Right Eye Distance:   Left Eye Distance:   Bilateral Distance:    Right Eye Near:   Left Eye Near:  Bilateral Near:     Physical Exam Constitutional:      General: She is not in acute distress.    Appearance: Normal appearance. She is well-developed. She is not toxic-appearing or diaphoretic.  HENT:     Head: Normocephalic and atraumatic.  Eyes:     Conjunctiva/sclera: Conjunctivae normal.     Pupils: Pupils are equal, round, and reactive to light.  Cardiovascular:     Rate and Rhythm: Normal rate and regular rhythm.  Pulmonary:     Effort: Pulmonary effort is normal. No respiratory distress.     Comments: Speaking in full sentences without difficulty. LCTAB Chest:     Comments: Tenderness to palpation of left chest Musculoskeletal:     Cervical back: Normal range of motion and neck supple.     Comments: No tenderness to spinous processes. Tenderness to palpation of left thoracic back. Full ROM of neck, shoulder, back. Strength 5/5. Sensation intact.   Skin:    General: Skin is warm and dry.  Neurological:     Mental Status: She is alert and oriented to person, place, and time.      UC Treatments / Results  Labs (all labs ordered are listed, but only abnormal results are displayed) Labs Reviewed - No data to display  EKG   Radiology No results found.  Procedures Procedures (including critical care time)  Medications Ordered in UC Medications - No data to display  Initial Impression / Assessment and Plan / UC Course  I have reviewed the triage vital signs and the nursing notes.  Pertinent labs & imaging results that were available during my care of the patient were reviewed by me and considered in my medical decision making (see chart for details).    No alarming signs on exam. No  fever, cough, shob. LCTAB, low suspicion for pneumonia/PE. EKG NSR, 60bpm, no ST changes. Pain is reproducible. Given recent EGD due to GERD/gastritis, will try to avoid NSAIDs for now. Tylenol, robaxin as directed. Can add voltaren gel for further symptomatic relief. Return precautions given.  Final Clinical Impressions(s) / UC Diagnoses   Final diagnoses:  Chest wall pain  Acute left-sided thoracic back pain   ED Prescriptions    Medication Sig Dispense Auth. Provider   diclofenac Sodium (VOLTAREN) 1 % GEL Apply 2 g topically 4 (four) times daily. 100 g Ok Edwards, PA-C     PDMP not reviewed this encounter.   Ok Edwards, PA-C 06/14/19 2220

## 2019-06-19 ENCOUNTER — Other Ambulatory Visit: Payer: Self-pay | Admitting: Internal Medicine

## 2019-06-19 ENCOUNTER — Encounter: Payer: Self-pay | Admitting: Internal Medicine

## 2019-06-19 DIAGNOSIS — R1310 Dysphagia, unspecified: Secondary | ICD-10-CM

## 2019-06-19 MED ORDER — FAMOTIDINE 40 MG PO TABS: 40.0000 mg | ORAL_TABLET | Freq: Every day | ORAL | 3 refills | Status: DC

## 2019-07-31 ENCOUNTER — Other Ambulatory Visit: Payer: Self-pay | Admitting: Internal Medicine

## 2019-07-31 DIAGNOSIS — M79604 Pain in right leg: Secondary | ICD-10-CM

## 2019-07-31 DIAGNOSIS — E559 Vitamin D deficiency, unspecified: Secondary | ICD-10-CM

## 2019-08-21 ENCOUNTER — Telehealth: Payer: Self-pay

## 2019-08-21 ENCOUNTER — Ambulatory Visit
Admission: EM | Admit: 2019-08-21 | Discharge: 2019-08-21 | Disposition: A | Discharge status: Home / Self Care | Payer: PRIVATE HEALTH INSURANCE | Attending: Emergency Medicine | Admitting: Emergency Medicine

## 2019-08-21 ENCOUNTER — Other Ambulatory Visit: Payer: Self-pay

## 2019-08-21 DIAGNOSIS — L282 Other prurigo: Secondary | ICD-10-CM | POA: Diagnosis not present

## 2019-08-21 MED ORDER — TRIAMCINOLONE ACETONIDE 0.1 % EX CREA: 1.0000 "application " | TOPICAL_CREAM | Freq: Two times a day (BID) | CUTANEOUS | 0 refills | Status: DC

## 2019-08-21 MED ORDER — PREDNISONE 10 MG (21) PO TBPK: ORAL_TABLET | Freq: Every day | ORAL | 0 refills | Status: DC

## 2019-08-21 NOTE — ED Triage Notes (Signed)
Itchy rash to back and bilateral arms x 1 week, worse at night. Benadryl not helping

## 2019-08-21 NOTE — Discharge Instructions (Addendum)
Take steroid as directed: Avoid hot water as this can further dry skin. Try changing detergent back to your previous, washing all bedding and linens. Please be on look out for bedbugs as well as any itchy rash for family members at home.

## 2019-08-21 NOTE — ED Provider Notes (Signed)
[facility]    CSN: [id number] Arrival date & time: 08/21/19  1601      History   Chief Complaint Chief Complaint  Patient presents with  . Rash    HPI [name] is a 54 y.o. female with history of obesity, hypertension, sleep apnea presenting for right-sided pruritic rash.  States has been there for about 1 week.  Had some on her left arm, though has persisted on right side.  Noticed more so at night.  States she sleeps in bed with her husband who has not had any rash.  Denies known bug bites, scabies.  Has tried calamine and hydrocortisone without relief.  Does admit to changing detergents recently.  No change in lifestyle, medications.   Past Medical History:  Diagnosis Date  . Arthritis   . COVID-19 06/2018  . Esophageal stenosis   . Esophagitis   . GERD (gastroesophageal reflux disease)   . Hiatal hernia   . Hypertension   . Iron deficiency anemia   . Migraines   . Morbid obesity ([place])   . Sleep apnea         Patient Active Problem List   Diagnosis Date Noted  . Dysphagia   . Esophageal diverticulum   . Migraine 08/18/2017  . Iron deficiency anemia due to chronic blood loss 05/18/2017  . Vitamin D deficiency 04/27/2017  . HLD (hyperlipidemia) 06/27/2016  . Gastroesophageal reflux disease with esophagitis 12/20/2014  . Fatty liver disease, nonalcoholic 84/69/6295  . Obstructive sleep apnea 09/28/2013  . Morbid obesity with BMI of 50.0-59.9, adult ([place]) 06/28/2013  . Esophageal dysmotility 06/28/2013  . Prediabetes 06/28/2013  . Leukocytosis 06/28/2013  . Low back pain radiating to both legs 06/28/2013  . Hip pain, bilateral 06/28/2013  . Essential hypertension, benign 11/04/2011  . Routine general medical examination at a health care facility 11/04/2011    Past Surgical History:  Procedure Laterality Date  . ABDOMINAL HYSTERECTOMY    . ABDOMINAL HYSTERECTOMY  2003  . CARDIOVASCULAR STRESS TEST  07/25/2007   Negative stress echo; ef  55-60%  . ESOPHAGEAL MANOMETRY  03/07/2012   Procedure: ESOPHAGEAL MANOMETRY (EM);  Surgeon: Sable Feil, MD;  Location: [facility];  Service: Endoscopy;  Laterality: N/A;  . ESOPHAGEAL MANOMETRY N/A 06/08/2019   Procedure: ESOPHAGEAL MANOMETRY (EM);  Surgeon: Gatha Mayer, MD;  Location: [facility];  Service: Endoscopy;  Laterality: N/A;  . ESOPHAGOGASTRODUODENOSCOPY    . ESOPHAGOGASTRODUODENOSCOPY    . ESOPHAGOGASTRODUODENOSCOPY (EGD) WITH PROPOFOL N/A 06/08/2019   Procedure: ESOPHAGOGASTRODUODENOSCOPY (EGD) WITH PROPOFOL;  Surgeon: Gatha Mayer, MD;  Location: [facility];  Service: Endoscopy;  Laterality: N/A;  . KNEE ARTHROSCOPY  01/2011   left  . KNEE SURGERY      OB History   No obstetric history on file.      Home Medications    Prior to Admission medications   Medication Sig Start Date End Date Taking? Authorizing Provider  acetaminophen (TYLENOL) 325 MG tablet Take 650 mg by mouth every 6 (six) hours as needed (for pain.).    [provider]  BYSTOLIC 10 MG tablet TAKE 1 TABLET(10 MG) BY MOUTH DAILY Patient taking differently: Take 10 mg by mouth daily at 2 am. (0400) 03/26/19   Janith Lima, MD  Cholecalciferol (VITAMIN D3) 1.25 MG (50000 UT) CAPS Take 50,000 Units by mouth every Monday. 07/31/19   Janith Lima, MD  diclofenac Sodium (VOLTAREN) 1 % GEL Apply 2 g topically 4 (four) times daily.  06/14/19   Cathie Hoops, Amy V, PA-C  famotidine (PEPCID) 40 MG tablet Take 1 tablet (40 mg total) by mouth at bedtime. 06/19/19   Iva Boop, MD  ibuprofen (ADVIL) 200 MG tablet Take 400 mg by mouth every 8 (eight) hours as needed (pain.).    [provider]  meclizine (ANTIVERT) 25 MG tablet Take 1 tablet (25 mg total) by mouth 3 (three) times daily as needed for dizziness. 05/23/19   Cathren Laine, MD  methocarbamol (ROBAXIN) 750 MG tablet TAKE 1 TABLET(750 MG) BY MOUTH EVERY 8 HOURS AS NEEDED FOR MUSCLE SPASMS 07/31/19   [name], MD  omeprazole  (PRILOSEC) 40 MG capsule Take 1 capsule (40 mg total) by mouth daily. Patient taking differently: Take 40 mg by mouth daily at 2 am. (0400) 03/13/19   [name], MD  predniSONE (STERAPRED UNI-PAK 21 TAB) 10 MG (21) TBPK tablet Take by mouth daily. Take steroid taper as written 08/21/19   Hall-Potvin, [place], PA-C  traMADol (ULTRAM) 50 MG tablet TAKE 1 TABLET(50 MG) BY MOUTH EVERY 6 HOURS AS NEEDED Patient taking differently: Take 50 mg by mouth every 6 (six) hours as needed (pain.).  02/06/19   [name], MD  triamcinolone cream (KENALOG) 0.1 % Apply 1 application topically 2 (two) times daily. 08/21/19   Hall-Potvin, [place], PA-C  Ubrogepant (UBRELVY) 100 MG TABS Take 1 tablet by mouth daily as needed. Patient not taking: Reported on 06/01/2019 09/20/18   [name], MD  Cholecalciferol 1.25 MG (50000 UT) capsule Take 1 capsule (50,000 Units total) by mouth once a week. 09/20/18   [name], MD    Family History Family History  Problem Relation Age of Onset  . Cervical cancer Mother   . Hypertension Sister   . Diabetes Sister   . Heart disease Neg Hx   . Cancer Neg Hx   . Alcohol abuse Neg Hx   . Early death Neg Hx   . Hearing loss Neg Hx   . Hyperlipidemia Neg Hx   . Kidney disease Neg Hx   . Stroke Neg Hx     Social History Social History   Tobacco Use  . Smoking status: Never Smoker  . Smokeless tobacco: Never Used  Vaping Use  . Vaping Use: Never used  Substance Use Topics  . Alcohol use: No  . Drug use: No     Allergies   Oxycodone and Pneumococcal vaccines   Review of Systems As per HPI   Physical Exam Triage Vital Signs ED Triage Vitals  Enc Vitals Group     BP 08/21/19 1618 (!) 149/87     Pulse Rate 08/21/19 1618 73     Resp 08/21/19 1618 18     Temp 08/21/19 1618 97.8 F (36.6 C)     Temp src --      SpO2 08/21/19 1618 93 %     Weight --      Height --      Head Circumference --      Peak Flow --      Pain Score 08/21/19  1619 0     Pain Loc --      Pain Edu? --      Excl. in GC? --    No data found.  Updated Vital Signs BP (!) 149/87   Pulse 73   Temp 97.8 F (36.6 C)   Resp 18   SpO2 93%   Visual Acuity Right Eye  Distance:   Left Eye Distance:   Bilateral Distance:    Right Eye Near:   Left Eye Near:    Bilateral Near:     Physical Exam Constitutional:      General: She is not in acute distress. HENT:     Head: Normocephalic and atraumatic.  Eyes:     General: No scleral icterus.    Pupils: Pupils are equal, round, and reactive to light.  Cardiovascular:     Rate and Rhythm: Normal rate.  Pulmonary:     Effort: Pulmonary effort is normal.  Skin:    Coloration: Skin is not jaundiced or pale.     Comments: Small, circumferential erythematous lesions over right arm without specific pattern, open wound, active discharge or tenderness.  No warmth or streaking.  Neurological:     Mental Status: She is alert and oriented to person, place, and time.      UC Treatments / Results  Labs (all labs ordered are listed, but only abnormal results are displayed) Labs Reviewed - No data to display  EKG   Radiology No results found.  Procedures Procedures (including critical care time)  Medications Ordered in UC Medications - No data to display  Initial Impression / Assessment and Plan / UC Course  I have reviewed the triage vital signs and the nursing notes.  Pertinent labs & imaging results that were available during my care of the patient were reviewed by me and considered in my medical decision making (see chart for details).     Patient febrile, nontoxic in office today.  Low concern for drug or food allergy, topical irritant given specificity of lesions on right arm.  Higher concern for bedbugs, the patient denies this and has been does not have any lesions.  We will treat supportively as outlined below.  Return precautions discussed, patient verbalized understanding and is  agreeable to plan. Final Clinical Impressions(s) / UC Diagnoses   Final diagnoses:  Pruritic rash     Discharge Instructions     Take steroid as directed: Avoid hot water as this can further dry skin. Try changing detergent back to your previous, washing all bedding and linens. Please be on look out for bedbugs as well as any itchy rash for family members at home.    ED Prescriptions    Medication Sig Dispense Auth. Provider   predniSONE (STERAPRED UNI-PAK 21 TAB) 10 MG (21) TBPK tablet Take by mouth daily. Take steroid taper as written 21 tablet Hall-Potvin, [place], PA-C   triamcinolone cream (KENALOG) 0.1 % Apply 1 application topically 2 (two) times daily. 30 g Hall-Potvin, [place], PA-C     PDMP not reviewed this encounter.   Hall-Potvin, [place], [place] 08/21/19 1702

## 2019-08-21 NOTE — Telephone Encounter (Signed)
New message     The patient called in voiced C/o rash on arm/back - worse at night   The patient is aware the call will be transfer to the [facility] Triage nurse for assessment.    Call and spoke with Delice Bison.

## 2019-08-28 ENCOUNTER — Other Ambulatory Visit: Payer: Self-pay | Admitting: Internal Medicine

## 2019-08-28 DIAGNOSIS — M25551 Pain in right hip: Secondary | ICD-10-CM

## 2019-08-28 DIAGNOSIS — M25552 Pain in left hip: Secondary | ICD-10-CM

## 2019-08-28 DIAGNOSIS — M79604 Pain in right leg: Secondary | ICD-10-CM

## 2019-08-30 ENCOUNTER — Ambulatory Visit: Payer: PRIVATE HEALTH INSURANCE | Admitting: Internal Medicine

## 2019-08-30 ENCOUNTER — Encounter: Payer: Self-pay | Admitting: Internal Medicine

## 2019-08-31 ENCOUNTER — Ambulatory Visit: Payer: PRIVATE HEALTH INSURANCE | Admitting: Internal Medicine

## 2019-09-07 ENCOUNTER — Other Ambulatory Visit: Payer: Self-pay

## 2019-09-07 ENCOUNTER — Ambulatory Visit: Payer: PRIVATE HEALTH INSURANCE | Admitting: Internal Medicine

## 2019-09-07 ENCOUNTER — Encounter: Payer: Self-pay | Admitting: Internal Medicine

## 2019-09-07 VITALS — BP 148/90 | HR 75 | Temp 98.1°F | Ht 65.0 in | Wt 275.0 lb

## 2019-09-07 DIAGNOSIS — L509 Urticaria, unspecified: Secondary | ICD-10-CM | POA: Diagnosis not present

## 2019-09-07 DIAGNOSIS — R21 Rash and other nonspecific skin eruption: Secondary | ICD-10-CM

## 2019-09-07 DIAGNOSIS — I10 Essential (primary) hypertension: Secondary | ICD-10-CM

## 2019-09-07 LAB — CBC WITH DIFFERENTIAL/PLATELET
Basophils Absolute: 0.1 10*3/uL (ref 0.0–0.1)
Basophils Relative: 1.1 % (ref 0.0–3.0)
Eosinophils Absolute: 0.3 10*3/uL (ref 0.0–0.7)
Eosinophils Relative: 3 % (ref 0.0–5.0)
HCT: 40.1 % (ref 36.0–46.0)
Hemoglobin: 13.3 g/dL (ref 12.0–15.0)
Lymphocytes Relative: 34.6 % (ref 12.0–46.0)
Lymphs Abs: 3.7 10*3/uL (ref 0.7–4.0)
MCHC: 33.1 g/dL (ref 30.0–36.0)
MCV: 85.9 fl (ref 78.0–100.0)
Monocytes Absolute: 0.8 10*3/uL (ref 0.1–1.0)
Monocytes Relative: 7.2 % (ref 3.0–12.0)
Neutro Abs: 5.9 10*3/uL (ref 1.4–7.7)
Neutrophils Relative %: 54.1 % (ref 43.0–77.0)
Platelets: 295 10*3/uL (ref 150.0–400.0)
RBC: 4.67 Mil/uL (ref 3.87–5.11)
RDW: 14.1 % (ref 11.5–15.5)
WBC: 10.8 10*3/uL — ABNORMAL HIGH (ref 4.0–10.5)

## 2019-09-07 LAB — SEDIMENTATION RATE: Sed Rate: 20 mm/hr (ref 0–30)

## 2019-09-07 MED ORDER — LEVOCETIRIZINE DIHYDROCHLORIDE 5 MG PO TABS: 10.0000 mg | ORAL_TABLET | Freq: Every evening | ORAL | 0 refills | Status: DC

## 2019-09-07 MED ORDER — MONTELUKAST SODIUM 10 MG PO TABS: 10.0000 mg | ORAL_TABLET | Freq: Every day | ORAL | 0 refills | Status: DC

## 2019-09-07 MED ORDER — NEBIVOLOL HCL 10 MG PO TABS: ORAL_TABLET | ORAL | 0 refills | Status: DC

## 2019-09-07 NOTE — Progress Notes (Signed)
Subjective:  Patient ID: [name], female    DOB: 1966-01-22  Age: 54 y.o. MRN: [id number]  CC: Rash  This visit occurred during the SARS-CoV-2 public health emergency.  Safety protocols were in place, including screening questions prior to the visit, additional usage of staff PPE, and extensive cleaning of exam room while observing appropriate contact time as indicated for disinfecting solutions.    HPI [name] presents for concerns about an itchy rash.  She describes a 1 week history of rash that comes and goes from different parts of her extremities and torso.  She describes it as hives.  She was recently seen at an urgent care center and took a course of steroids and got some improvement but now the symptoms have returned.  She also gets brief relief with Benadryl.  She is not been exposed to any new medications.  She does not take NSAIDs and does not drink alcohol.  She is on the same dosage of tramadol as usual.  She takes an H2 blocker for GERD.  She has not been monitoring her blood pressure.  According to prescription refills she would have run out of nebivolol several months ago.  Outpatient Medications Prior to Visit  Medication Sig Dispense Refill  . acetaminophen (TYLENOL) 325 MG tablet Take 650 mg by mouth every 6 (six) hours as needed (for pain.).    Marland Kitchen Cholecalciferol (VITAMIN D3) 1.25 MG (50000 UT) CAPS Take 50,000 Units by mouth every Monday. 12 capsule 0  . famotidine (PEPCID) 40 MG tablet Take 1 tablet (40 mg total) by mouth at bedtime. 90 tablet 3  . methocarbamol (ROBAXIN) 750 MG tablet TAKE 1 TABLET(750 MG) BY MOUTH EVERY 8 HOURS AS NEEDED FOR MUSCLE SPASMS 65 tablet 2  . omeprazole (PRILOSEC) 40 MG capsule Take 1 capsule (40 mg total) by mouth daily. (Patient taking differently: Take 40 mg by mouth daily at 2 am. (0400)) 90 capsule 1  . traMADol (ULTRAM) 50 MG tablet TAKE 1 TABLET(50 MG) BY MOUTH EVERY 6 HOURS AS NEEDED 65 tablet 3  . Ubrogepant  (UBRELVY) 100 MG TABS Take 1 tablet by mouth daily as needed. 10 tablet 5  . BYSTOLIC 10 MG tablet TAKE 1 TABLET(10 MG) BY MOUTH DAILY (Patient taking differently: Take 10 mg by mouth daily at 2 am. (0400)) 90 tablet 0  . ibuprofen (ADVIL) 200 MG tablet Take 400 mg by mouth every 8 (eight) hours as needed (pain.).    Marland Kitchen meclizine (ANTIVERT) 25 MG tablet Take 1 tablet (25 mg total) by mouth 3 (three) times daily as needed for dizziness. 15 tablet 0  . diclofenac Sodium (VOLTAREN) 1 % GEL Apply 2 g topically 4 (four) times daily. 100 g 0  . predniSONE (STERAPRED UNI-PAK 21 TAB) 10 MG (21) TBPK tablet Take by mouth daily. Take steroid taper as written 21 tablet 0  . triamcinolone cream (KENALOG) 0.1 % Apply 1 application topically 2 (two) times daily. 30 g 0   No facility-administered medications prior to visit.    ROS Review of Systems  Constitutional: Negative.  Negative for chills, diaphoresis, fatigue and fever.  HENT: Negative.  Negative for sore throat and trouble swallowing.   Eyes: Negative.   Respiratory: Negative for cough, chest tightness, shortness of breath and wheezing.   Cardiovascular: Negative for chest pain, palpitations and leg swelling.  Gastrointestinal: Negative for abdominal pain, constipation, diarrhea, nausea and vomiting.  Endocrine: Negative.   Genitourinary: Negative.  Negative for difficulty urinating, dysuria and  urgency.  Musculoskeletal: Negative.  Negative for arthralgias and myalgias.  Skin: Positive for rash. Negative for color change.  Neurological: Negative for dizziness, weakness and headaches.  Hematological: Negative for adenopathy. Does not bruise/bleed easily.  Psychiatric/Behavioral: Negative.     Objective:  BP (!) 148/90 (BP Location: Left Arm, Patient Position: Sitting, Cuff Size: Large)   Pulse 75   Temp 98.1 F (36.7 C) (Oral)   Ht 5\' 5"  (1.651 m)   Wt 275 lb (124.7 kg)   SpO2 97%   BMI 45.76 kg/m   BP Readings from Last 3 Encounters:   09/07/19 (!) 148/90  08/21/19 (!) 149/87  06/14/19 (!) 145/84    Wt Readings from Last 3 Encounters:  09/07/19 275 lb (124.7 kg)  05/23/19 270 lb (122.5 kg)  05/17/19 277 lb (125.6 kg)    Physical Exam Vitals reviewed.  Constitutional:      General: She is not in acute distress.    Appearance: Normal appearance. She is not ill-appearing, toxic-appearing or diaphoretic.  HENT:     Nose: Nose normal.     Mouth/Throat:     Mouth: Mucous membranes are moist.  Eyes:     General: No scleral icterus.    Conjunctiva/sclera: Conjunctivae normal.  Cardiovascular:     Rate and Rhythm: Normal rate and regular rhythm.     Heart sounds: No murmur heard.   Pulmonary:     Effort: Pulmonary effort is normal.     Breath sounds: No wheezing, rhonchi or rales.  Abdominal:     General: Abdomen is protuberant. Bowel sounds are normal. There is no distension.     Palpations: Abdomen is soft. There is no hepatomegaly, splenomegaly or mass.     Tenderness: There is no abdominal tenderness.  Musculoskeletal:        General: Normal range of motion.     Cervical back: Neck supple.     Right lower leg: No edema.     Left lower leg: No edema.  Lymphadenopathy:     Cervical: No cervical adenopathy.  Skin:    General: Skin is warm and dry.     Findings: Rash present.     Comments: There are faint, erythematous macules on the dorsum of her upper extremities.  There are no targets or vesicles.  There is no involvement of the palms or soles.  There is no involvement of the mucous membranes.  Neurological:     General: No focal deficit present.     Mental Status: She is alert.  Psychiatric:        Mood and Affect: Mood normal.        Behavior: Behavior normal.     Lab Results  Component Value Date   WBC 10.8 (H) 09/07/2019   HGB 13.3 09/07/2019   HCT 40.1 09/07/2019   PLT 295.0 09/07/2019   GLUCOSE 94 05/23/2019   CHOL 193 09/20/2018   TRIG 83.0 09/20/2018   HDL 55.40 09/20/2018    LDLCALC 121 (H) 09/20/2018   ALT 11 05/23/2019   AST 16 05/23/2019   NA 139 05/23/2019   K 3.9 05/23/2019   CL 105 05/23/2019   CREATININE 0.63 05/23/2019   BUN 12 05/23/2019   CO2 27 05/23/2019   TSH 1.60 09/20/2018   INR 1.0 07/06/2007   HGBA1C 5.7 09/20/2018    No results found.  Assessment & Plan:   [name] was seen today for rash.  Diagnoses and all orders for this visit:  Urticaria- Her  white cell count is mildly elevated but this is not a new finding for her.  Also, she recently took a course of systemic steroids.  She is not anemic and her other cell lines are normal.  I do not think her rash is related to a lymphoid dyscrasia.  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  Will screen her for alpha gal. -     CBC with Differential/Platelet; Future -     Alpha-Gal Panel; Future -     Sedimentation rate; Future -     levocetirizine (XYZAL) 5 MG tablet; Take 2 tablets (10 mg total) by mouth every evening. -     montelukast (SINGULAIR) 10 MG tablet; Take 1 tablet (10 mg total) by mouth at bedtime. -     Sedimentation rate -     Alpha-Gal Panel -     CBC with Differential/Platelet  Rash- See above. -     CBC with Differential/Platelet; Future -     Alpha-Gal Panel; Future -     Sedimentation rate; Future -     Sedimentation rate -     Alpha-Gal Panel -     CBC with Differential/Platelet  Essential hypertension, benign- Her blood pressure is not adequately well controlled.  I have asked her to restart nebivolol. -     CBC with Differential/Platelet; Future -     nebivolol (BYSTOLIC) 10 MG tablet; TAKE 1 TABLET(10 MG) BY MOUTH DAILY -     CBC with Differential/Platelet   I have discontinued Hawa Tirrell's meclizine, ibuprofen, diclofenac Sodium, predniSONE, and triamcinolone cream. I have also changed her Bystolic to nebivolol. Additionally, I am having her start on levocetirizine  and montelukast. Lastly, I am having her maintain her Ubrelvy, omeprazole, acetaminophen, famotidine, Vitamin D3, methocarbamol, and traMADol.  Meds ordered this encounter  Medications  . nebivolol (BYSTOLIC) 10 MG tablet    Sig: TAKE 1 TABLET(10 MG) BY MOUTH DAILY    Dispense:  90 tablet    Refill:  0  . levocetirizine (XYZAL) 5 MG tablet    Sig: Take 2 tablets (10 mg total) by mouth every evening.    Dispense:  180 tablet    Refill:  0  . montelukast (SINGULAIR) 10 MG tablet    Sig: Take 1 tablet (10 mg total) by mouth at bedtime.    Dispense:  90 tablet    Refill:  0   I spent 50 minutes in preparing to see the patient by review of recent labs, imaging and procedures, obtaining and reviewing separately obtained history, communicating with the patient and family or caregiver, ordering medications, tests or procedures, and documenting clinical information in the EHR including the differential Dx, treatment, and any further evaluation and other management of 1. Urticaria 2. Rash 3. Essential hypertension, benign    Follow-up: Return in about 3 months (around 12/08/2019).  Sanda Linger, MD

## 2019-09-07 NOTE — Patient Instructions (Signed)
Hives Hives (urticaria) are itchy, red, swollen areas on the skin. Hives can appear on any part of the body. Hives often fade within 24 hours (acute hives). Sometimes, new hives appear after old ones fade and the cycle can continue for several days or weeks (chronic hives). Hives do not spread from person to person (are not contagious). Hives come from the body's reaction to something a person is allergic to (allergen), something that causes irritation, or various other triggers. When a person is exposed to a trigger, his or her body releases a chemical (histamine) that causes redness, itching, and swelling. Hives can appear right after exposure to a trigger or hours later. What are the causes? This condition may be caused by:  Allergies to foods or ingredients.  Insect bites or stings.  Exposure to pollen or pets.  Contact with latex or chemicals.  Spending time in sunlight, heat, or cold (exposure).  Exercise.  Stress.  Certain medicines. You can also get hives from other medical conditions and treatments, such as:  Viruses, including the common cold.  Bacterial infections, such as urinary tract infections and strep throat.  Certain medicines.  Allergy shots.  Blood transfusions. Sometimes, the cause of this condition is not known (idiopathic hives). What increases the risk? You are more likely to develop this condition if you:  Are a woman.  Have food allergies, especially to citrus fruits, milk, eggs, peanuts, tree nuts, or shellfish.  Are allergic to: ? Medicines. ? Latex. ? Insects. ? Animals. ? Pollen. What are the signs or symptoms? Common symptoms of this condition include raised, itchy, red or white bumps or patches on your skin. These areas may:  Become large and swollen (welts).  Change in shape and location, quickly and repeatedly.  Be separate hives or connect over a large area of skin.  Sting or become painful.  Turn white when pressed in the  center (blanch). In severe cases, yourhands, feet, and face may also become swollen. This may occur if hives develop deeper in your skin. How is this diagnosed? This condition may be diagnosed by your symptoms, medical history, and physical exam.  Your skin, urine, or blood may be tested to find out what is causing your hives and to rule out other health issues.  Your health care provider may also remove a small sample of skin from the affected area and examine it under a microscope (biopsy). How is this treated? Treatment for this condition depends on the cause and severity of your symptoms. Your health care provider may recommend using cool, wet cloths (cool compresses) or taking cool showers to relieve itching. Treatment may include:  Medicines that help: ? Relieve itching (antihistamines). ? Reduce swelling (corticosteroids). ? Treat infection (antibiotics).  An injectable medicine (omalizumab). Your health care provider may prescribe this if you have chronic idiopathic hives and you continue to have symptoms even after treatment with antihistamines. Severe cases may require an emergency injection of adrenaline (epinephrine) to prevent a life-threatening allergic reaction (anaphylaxis). Follow these instructions at home: Medicines  Take and apply over-the-counter and prescription medicines only as told by your health care provider.  If you were prescribed an antibiotic medicine, take it as told by your health care provider. Do not stop using the antibiotic even if you start to feel better. Skin care  Apply cool compresses to the affected areas.  Do not scratch or rub your skin. General instructions  Do not take hot showers or baths. This can make itching   worse.  Do not wear tight-fitting clothing.  Use sunscreen and wear protective clothing when you are outside.  Avoid any substances that cause your hives. Keep a journal to help track what causes your hives. Write  down: ? What medicines you take. ? What you eat and drink. ? What products you use on your skin.  Keep all follow-up visits as told by your health care provider. This is important. Contact a health care provider if:  Your symptoms are not controlled with medicine.  Your joints are painful or swollen. Get help right away if:  You have a fever.  You have pain in your abdomen.  Your tongue or lips are swollen.  Your eyelids are swollen.  Your chest or throat feels tight.  You have trouble breathing or swallowing. These symptoms may represent a serious problem that is an emergency. Do not wait to see if the symptoms will go away. Get medical help right away. Call your local emergency services (911 in the [place] Do not drive yourself to the hospital. Summary  Hives (urticaria) are itchy, red, swollen areas on your skin. Hives come from the body's reaction to something a person is allergic to (allergen), something that causes irritation, or various other triggers.  Treatment for this condition depends on the cause and severity of your symptoms.  Avoid any substances that cause your hives. Keep a journal to help track what causes your hives.  Take and apply over-the-counter and prescription medicines only as told by your health care provider.  Keep all follow-up visits as told by your health care provider. This is important. This information is not intended to replace advice given to you by your health care provider. Make sure you discuss any questions you have with your health care provider. Document Revised: 09/08/2017 Document Reviewed: 09/08/2017 Elsevier Patient Education  2020 [employer]  

## 2019-09-14 LAB — ALPHA-GAL PANEL
Beef IgE: <0.1 kU/L (ref <0.35)
Class: 0
Class: 0
Class: 0
Galactose-alpha-1,3-galactose IgE: <0.1 kU/L (ref <0.10)
LAMB/MUTTON IGE: <0.1 kU/L (ref <0.35)
Pork IgE: <0.1 kU/L (ref <0.35)

## 2019-11-17 ENCOUNTER — Other Ambulatory Visit: Payer: Self-pay | Admitting: Internal Medicine

## 2019-11-17 DIAGNOSIS — K2100 Gastro-esophageal reflux disease with esophagitis, without bleeding: Secondary | ICD-10-CM

## 2019-11-21 ENCOUNTER — Other Ambulatory Visit: Payer: Self-pay | Admitting: Internal Medicine

## 2019-11-21 ENCOUNTER — Encounter: Payer: Self-pay | Admitting: Internal Medicine

## 2019-11-21 DIAGNOSIS — I10 Essential (primary) hypertension: Secondary | ICD-10-CM

## 2019-11-21 MED ORDER — CARVEDILOL 6.25 MG PO TABS: 6.2500 mg | ORAL_TABLET | Freq: Two times a day (BID) | ORAL | 0 refills | Status: DC

## 2019-12-10 ENCOUNTER — Other Ambulatory Visit: Payer: Self-pay | Admitting: Internal Medicine

## 2019-12-10 DIAGNOSIS — L509 Urticaria, unspecified: Secondary | ICD-10-CM

## 2019-12-11 ENCOUNTER — Encounter: Payer: Self-pay | Admitting: Internal Medicine

## 2019-12-22 ENCOUNTER — Encounter: Payer: Self-pay | Admitting: Internal Medicine

## 2019-12-23 ENCOUNTER — Encounter: Payer: Self-pay | Admitting: Emergency Medicine

## 2019-12-23 ENCOUNTER — Ambulatory Visit
Admission: EM | Admit: 2019-12-23 | Discharge: 2019-12-23 | Disposition: A | Discharge status: Home / Self Care | Payer: PRIVATE HEALTH INSURANCE | Attending: Physician Assistant | Admitting: Physician Assistant

## 2019-12-23 ENCOUNTER — Encounter: Payer: Self-pay | Admitting: Internal Medicine

## 2019-12-23 ENCOUNTER — Other Ambulatory Visit: Payer: Self-pay

## 2019-12-23 DIAGNOSIS — R42 Dizziness and giddiness: Secondary | ICD-10-CM

## 2019-12-23 DIAGNOSIS — R002 Palpitations: Secondary | ICD-10-CM

## 2019-12-23 LAB — POCT FASTING CBG KUC MANUAL ENTRY: POCT Glucose (KUC): 83 mg/dL (ref 70–99)

## 2019-12-23 NOTE — Discharge Instructions (Signed)
No alarming signs on exam. Coreg twice a day with meals. Keep hydrated, urine should be clear to pale yellow in color. Follow up with PCP for reevaluation. If having worsening symptoms, chest pain, passing out, got to the ED for further evaluation.

## 2019-12-23 NOTE — ED Provider Notes (Signed)
[facility]    CSN: [id number] Arrival date & time: 12/23/19  1124      History   Chief Complaint Chief Complaint  Patient presents with  . Palpitations    HPI [name] is a 54 y.o. female.   54 year old female comes in for palpitations and lightheadedness starting last night.  States had cough, chest pain, chills the past few days, but this has completely resolved prior to current symptom onset.  Negative Covid test 2 days ago.  States last night while at work, started having palpitations and lightheadedness that lasted for 1 to 2 hours and resolved on own.  Denies associated chest pain, shortness of breath, syncope.  Since then, has had intermittent symptoms.  Denies fever.  Denies nausea, vomiting, diarrhea.  Denies urinary changes.  Shewas on Bystolic for BP control, and was switched to Coreg 2 weeks ago due to insurance change.  However, did not realize prescription was for twice daily, and has been taking once daily.     Past Medical History:  Diagnosis Date  . Arthritis   . COVID-19 06/2018  . Esophageal stenosis   . Esophagitis   . GERD (gastroesophageal reflux disease)   . Hiatal hernia   . Hypertension   . Iron deficiency anemia   . Migraines   . Morbid obesity (HCC)   . Sleep apnea         Patient Active Problem List   Diagnosis Date Noted  . Urticaria 09/07/2019  . Rash 09/07/2019  . Dysphagia   . Esophageal diverticulum   . Migraine 08/18/2017  . Iron deficiency anemia due to chronic blood loss 05/18/2017  . Vitamin D deficiency 04/27/2017  . HLD (hyperlipidemia) 06/27/2016  . Gastroesophageal reflux disease with esophagitis 12/20/2014  . Fatty liver disease, nonalcoholic 12/11/2014  . Obstructive sleep apnea 09/28/2013  . Morbid obesity with BMI of 50.0-59.9, adult (HCC) 06/28/2013  . Esophageal dysmotility 06/28/2013  . Prediabetes 06/28/2013  . Leukocytosis 06/28/2013  . Low back pain radiating to both legs 06/28/2013  .  Hip pain, bilateral 06/28/2013  . Essential hypertension, benign 11/04/2011  . Routine general medical examination at a health care facility 11/04/2011    Past Surgical History:  Procedure Laterality Date  . ABDOMINAL HYSTERECTOMY    . ABDOMINAL HYSTERECTOMY  2003  . CARDIOVASCULAR STRESS TEST  07/25/2007   Negative stress echo; ef 55-60%  . ESOPHAGEAL MANOMETRY  03/07/2012   Procedure: ESOPHAGEAL MANOMETRY (EM);  Surgeon: Mardella Layman, MD;  Location: [facility];  Service: Endoscopy;  Laterality: N/A;  . ESOPHAGEAL MANOMETRY N/A 06/08/2019   Procedure: ESOPHAGEAL MANOMETRY (EM);  Surgeon: Iva Boop, MD;  Location: [facility];  Service: Endoscopy;  Laterality: N/A;  . ESOPHAGOGASTRODUODENOSCOPY    . ESOPHAGOGASTRODUODENOSCOPY    . ESOPHAGOGASTRODUODENOSCOPY (EGD) WITH PROPOFOL N/A 06/08/2019   Procedure: ESOPHAGOGASTRODUODENOSCOPY (EGD) WITH PROPOFOL;  Surgeon: Iva Boop, MD;  Location: [facility];  Service: Endoscopy;  Laterality: N/A;  . KNEE ARTHROSCOPY  01/2011   left  . KNEE SURGERY      OB History   No obstetric history on file.      Home Medications    Prior to Admission medications   Medication Sig Start Date End Date Taking? Authorizing Provider  carvedilol (COREG) 6.25 MG tablet Take 1 tablet (6.25 mg total) by mouth 2 (two) times daily with a meal. 11/21/19  Yes [name], MD  methocarbamol (ROBAXIN) 750 MG tablet TAKE 1 TABLET(750 MG) BY MOUTH  EVERY 8 HOURS AS NEEDED FOR MUSCLE SPASMS 07/31/19  Yes [name], MD  omeprazole (PRILOSEC) 40 MG capsule Take 1 capsule (40 mg total) by mouth daily at 2 am. (0400) 11/17/19  Yes [name], MD  traMADol (ULTRAM) 50 MG tablet TAKE 1 TABLET(50 MG) BY MOUTH EVERY 6 HOURS AS NEEDED 08/28/19  Yes [name], MD  acetaminophen (TYLENOL) 325 MG tablet Take 650 mg by mouth every 6 (six) hours as needed (for pain.).    [provider]  Cholecalciferol (VITAMIN D3) 1.25 MG (50000 UT) CAPS  Take 50,000 Units by mouth every Monday. 07/31/19   [name], MD  famotidine (PEPCID) 40 MG tablet Take 1 tablet (40 mg total) by mouth at bedtime. 06/19/19   Iva Boop, MD  levocetirizine (XYZAL) 5 MG tablet Take 2 tablets (10 mg total) by mouth every evening. 09/07/19   [name], MD  montelukast (SINGULAIR) 10 MG tablet TAKE 1 TABLET(10 MG) BY MOUTH AT BEDTIME 12/10/19   [name], MD  Ubrogepant (UBRELVY) 100 MG TABS Take 1 tablet by mouth daily as needed. 09/20/18   [name], MD  Cholecalciferol 1.25 MG (50000 UT) capsule Take 1 capsule (50,000 Units total) by mouth once a week. 09/20/18   [name], MD  montelukast (SINGULAIR) 10 MG tablet Take 1 tablet (10 mg total) by mouth at bedtime. 09/07/19   [name], MD    Family History Family History  Problem Relation Age of Onset  . Cervical cancer Mother   . Hypertension Sister   . Diabetes Sister   . Heart disease Neg Hx   . Cancer Neg Hx   . Alcohol abuse Neg Hx   . Early death Neg Hx   . Hearing loss Neg Hx   . Hyperlipidemia Neg Hx   . Kidney disease Neg Hx   . Stroke Neg Hx     Social History Social History   Tobacco Use  . Smoking status: Never Smoker  . Smokeless tobacco: Never Used  Vaping Use  . Vaping Use: Never used  Substance Use Topics  . Alcohol use: No  . Drug use: No     Allergies   Oxycodone and Pneumococcal vaccines   Review of Systems Review of Systems  Reason unable to perform ROS: See HPI as above.     Physical Exam Triage Vital Signs ED Triage Vitals [12/23/19 1131]  Enc Vitals Group     BP (!) 163/86     Pulse Rate 70     Resp 16     Temp 98.6 F (37 C)     Temp Source Oral     SpO2 96 %     Weight      Height      Head Circumference      Peak Flow      Pain Score 2     Pain Loc      Pain Edu?      Excl. in GC?    No data found.  Updated Vital Signs BP (!) 163/86   Pulse 70   Temp 98.6 F (37 C) (Oral)   Resp 16   SpO2 96%    Visual Acuity Right Eye Distance:   Left Eye Distance:   Bilateral Distance:    Right Eye Near:   Left Eye Near:    Bilateral Near:     Physical Exam Constitutional:  General: She is not in acute distress.    Appearance: Normal appearance. She is not ill-appearing, toxic-appearing or diaphoretic.  HENT:     Head: Normocephalic and atraumatic.     Right Ear: Tympanic membrane, ear canal and external ear normal.     Left Ear: Tympanic membrane, ear canal and external ear normal.     Mouth/Throat:     Mouth: Mucous membranes are moist.     Pharynx: Oropharynx is clear. Uvula midline.  Eyes:     Extraocular Movements: Extraocular movements intact.     Conjunctiva/sclera: Conjunctivae normal.     Pupils: Pupils are equal, round, and reactive to light.  Cardiovascular:     Rate and Rhythm: Normal rate and regular rhythm.     Heart sounds: Normal heart sounds. No murmur heard.  No friction rub. No gallop.   Pulmonary:     Effort: Pulmonary effort is normal. No accessory muscle usage, prolonged expiration, respiratory distress or retractions.     Comments: Lungs clear to auscultation without adventitious lung sounds. Musculoskeletal:     Cervical back: Normal range of motion and neck supple.  Skin:    General: Skin is warm and dry.  Neurological:     General: No focal deficit present.     Mental Status: She is alert and oriented to person, place, and time.     Comments: Cranial nerves II-XII grossly intact. Strength 5/5 bilaterally for upper and lower extremity. Sensation intact. Normal coordination with normal finger to nose, heel to shin. Negative pronator drift, romberg. Gait intact. Able to ambulate on own without difficulty.        UC Treatments / Results  Labs (all labs ordered are listed, but only abnormal results are displayed) Labs Reviewed  POCT FASTING CBG [facility] MANUAL ENTRY    EKG   Radiology No results found.  Procedures Procedures (including  critical care time)  Medications Ordered in UC Medications - No data to display  Initial Impression / Assessment and Plan / UC Course  I have reviewed the triage vital signs and the nursing notes.  Pertinent labs & imaging results that were available during my care of the patient were reviewed by me and considered in my medical decision making (see chart for details).    EKG NSR, 72bpm, no ST changes, no changes from prior EKG. RN did notice a PVC while obtaining EKG. No alarming signs on exam. For now, will have patient take coreg BID as directed. Push fluids and continue to monitor. If symptoms worsens, to discontinue coreg and contact PCP. Return precautions given.   Final Clinical Impressions(s) / UC Diagnoses   Final diagnoses:  Palpitations  Intermittent lightheadedness   ED Prescriptions    None     PDMP not reviewed this encounter.   Belinda Fisher, PA-C 12/23/19 1206

## 2019-12-23 NOTE — ED Triage Notes (Signed)
Palpitations and lightheadedness started last night. Started coreg 2 weeks ago

## 2019-12-27 ENCOUNTER — Other Ambulatory Visit: Payer: Self-pay

## 2019-12-27 ENCOUNTER — Emergency Department (HOSPITAL_BASED_OUTPATIENT_CLINIC_OR_DEPARTMENT_OTHER): Payer: PRIVATE HEALTH INSURANCE

## 2019-12-27 ENCOUNTER — Encounter (HOSPITAL_BASED_OUTPATIENT_CLINIC_OR_DEPARTMENT_OTHER): Payer: Self-pay | Admitting: *Deleted

## 2019-12-27 ENCOUNTER — Emergency Department (HOSPITAL_BASED_OUTPATIENT_CLINIC_OR_DEPARTMENT_OTHER)
Admission: EM | Admit: 2019-12-27 | Discharge: 2019-12-27 | Disposition: A | Discharge status: Home / Self Care | Payer: PRIVATE HEALTH INSURANCE | Attending: Emergency Medicine | Admitting: Emergency Medicine

## 2019-12-27 DIAGNOSIS — Z8616 Personal history of COVID-19: Secondary | ICD-10-CM | POA: Diagnosis not present

## 2019-12-27 DIAGNOSIS — I10 Essential (primary) hypertension: Secondary | ICD-10-CM | POA: Diagnosis not present

## 2019-12-27 DIAGNOSIS — R002 Palpitations: Secondary | ICD-10-CM | POA: Diagnosis not present

## 2019-12-27 LAB — CBC
HCT: 39.5 % (ref 36.0–46.0)
Hemoglobin: 12.9 g/dL (ref 12.0–15.0)
MCH: 28.2 pg (ref 26.0–34.0)
MCHC: 32.7 g/dL (ref 30.0–36.0)
MCV: 86.4 fL (ref 80.0–100.0)
Platelets: 302 10*3/uL (ref 150–400)
RBC: 4.57 MIL/uL (ref 3.87–5.11)
RDW: 13.7 % (ref 11.5–15.5)
WBC: 10.4 10*3/uL (ref 4.0–10.5)
nRBC: 0 % (ref 0.0–0.2)

## 2019-12-27 LAB — COMPREHENSIVE METABOLIC PANEL
ALT: 9 U/L (ref 0–44)
AST: 16 U/L (ref 15–41)
Albumin: 3.5 g/dL (ref 3.5–5.0)
Alkaline Phosphatase: 73 U/L (ref 38–126)
Anion gap: 11 (ref 5–15)
BUN: 9 mg/dL (ref 6–20)
CO2: 24 mmol/L (ref 22–32)
Calcium: 8.6 mg/dL — ABNORMAL LOW (ref 8.9–10.3)
Chloride: 104 mmol/L (ref 98–111)
Creatinine, Ser: 0.62 mg/dL (ref 0.44–1.00)
GFR, Estimated: >60 mL/min (ref >60)
Glucose, Bld: 118 mg/dL — ABNORMAL HIGH (ref 70–99)
Potassium: 3.9 mmol/L (ref 3.5–5.1)
Sodium: 139 mmol/L (ref 135–145)
Total Bilirubin: 0.6 mg/dL (ref 0.3–1.2)
Total Protein: 7 g/dL (ref 6.5–8.1)

## 2019-12-27 NOTE — ED Triage Notes (Signed)
palpations and dizziness x 6 days , seen at UC x 3 days ago for same, pt reports new med coreg x 2 weeks

## 2019-12-27 NOTE — ED Provider Notes (Signed)
[facility] Emergency Department Provider Note MRN:  [id number]  Arrival date & time: 12/27/19     Chief Complaint   Palpitations   History of Present Illness   [name] is a 54 y.o. year-old female with a history of obesity, hypertension presenting to the ED with chief complaint of palpitation.  Palpitations intermittently for the past 6 days ever since starting new Coreg medication.  Denies shortness of breath, no chest pain, occasionally feels lightheaded, denies vertigo, no abdominal pain, no numbness or weakness to the arms or legs, no headache.  No exacerbating or alleviating factors, symptoms intermittent.  Review of Systems  A complete 10 system review of systems was obtained and all systems are negative except as noted in the HPI and PMH.   Patient's Health History    Past Medical History:  Diagnosis Date  . Arthritis   . COVID-19 06/2018  . Esophageal stenosis   . Esophagitis   . GERD (gastroesophageal reflux disease)   . Hiatal hernia   . Hypertension   . Iron deficiency anemia   . Migraines   . Morbid obesity (HCC)   . Sleep apnea         Past Surgical History:  Procedure Laterality Date  . ABDOMINAL HYSTERECTOMY    . ABDOMINAL HYSTERECTOMY  2003  . CARDIOVASCULAR STRESS TEST  07/25/2007   Negative stress echo; ef 55-60%  . ESOPHAGEAL MANOMETRY  03/07/2012   Procedure: ESOPHAGEAL MANOMETRY (EM);  Surgeon: Mardella Layman, MD;  Location: [facility];  Service: Endoscopy;  Laterality: N/A;  . ESOPHAGEAL MANOMETRY N/A 06/08/2019   Procedure: ESOPHAGEAL MANOMETRY (EM);  Surgeon: Iva Boop, MD;  Location: [facility];  Service: Endoscopy;  Laterality: N/A;  . ESOPHAGOGASTRODUODENOSCOPY    . ESOPHAGOGASTRODUODENOSCOPY    . ESOPHAGOGASTRODUODENOSCOPY (EGD) WITH PROPOFOL N/A 06/08/2019   Procedure: ESOPHAGOGASTRODUODENOSCOPY (EGD) WITH PROPOFOL;  Surgeon: Iva Boop, MD;  Location: [facility];  Service: Endoscopy;   Laterality: N/A;  . KNEE ARTHROSCOPY  01/2011   left  . KNEE SURGERY      Family History  Problem Relation Age of Onset  . Cervical cancer Mother   . Hypertension Sister   . Diabetes Sister   . Heart disease Neg Hx   . Cancer Neg Hx   . Alcohol abuse Neg Hx   . Early death Neg Hx   . Hearing loss Neg Hx   . Hyperlipidemia Neg Hx   . Kidney disease Neg Hx   . Stroke Neg Hx     Social History   Socioeconomic History  . Marital status: Married    Spouse name: Not on file  . Number of children: 0  . Years of education: Not on file  . Highest education level: Not on file  Occupational History  . Occupation: Scientist, research (medical): [employer]  Tobacco Use  . Smoking status: Never Smoker  . Smokeless tobacco: Never Used  Vaping Use  . Vaping Use: Never used  Substance and Sexual Activity  . Alcohol use: No  . Drug use: No  . Sexual activity: Not Currently    Birth control/protection: Surgical  Other Topics Concern  . 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 Difficulty of Paying Living Expenses: Not on file  Food  Insecurity:   . Worried About Programme researcher, broadcasting/film/video in the Last Year: Not on file  . Ran Out of Food in the Last Year: Not on file  Transportation Needs:   . Lack of Transportation (Medical): Not on file  . Lack of Transportation (Non-Medical): Not on file  Physical Activity:   . Days of Exercise per Week: Not on file  . Minutes of Exercise per Session: Not on file  Stress:   . Feeling of Stress : Not on file  Social Connections:   . Frequency of Communication with Friends and Family: Not on file  . Frequency of Social Gatherings with Friends and Family: Not on file  . Attends Religious Services: Not on file  . Active Member of Clubs or Organizations: Not on file  . Attends Banker Meetings: Not  on file  . Marital Status: Not on file  Intimate Partner Violence:   . Fear of Current or Ex-Partner: Not on file  . Emotionally Abused: Not on file  . Physically Abused: Not on file  . Sexually Abused: Not on file     Physical Exam   Vitals:   12/27/19 2043  BP: (!) 153/93  Pulse: 69  Resp: 18  Temp: 98.1 F (36.7 C)  SpO2: 98%    CONSTITUTIONAL: Well-appearing, NAD NEURO:  Alert and oriented x 3, no focal deficits EYES:  eyes equal and reactive ENT/NECK:  no LAD, no JVD CARDIO: Regular rate, well-perfused, normal S1 and S2 PULM:  CTAB no wheezing or rhonchi GI/GU:  normal bowel sounds, non-distended, non-tender MSK/SPINE:  No gross deformities, no edema SKIN:  no rash, atraumatic PSYCH:  Appropriate speech and behavior  *Additional and/or pertinent findings included in MDM below  Diagnostic and Interventional Summary    EKG Interpretation  Date/Time:  Wednesday December 27 2019 20:44:53 EDT Ventricular Rate:  73 PR Interval:  138 QRS Duration: 68 QT Interval:  376 QTC Calculation: 414 R Axis:   18 Text Interpretation: Normal sinus rhythm Nonspecific T wave abnormality Abnormal ECG Confirmed by Kennis Carina [phone]) on 12/27/2019 10:22:05 PM      Labs Reviewed  COMPREHENSIVE METABOLIC PANEL - Abnormal; Notable for the following components:      Result Value   Glucose, Bld 118 (*)    Calcium 8.6 (*)    All other components within normal limits  CBC    DG Chest Port 1 View  Final Result      Medications - No data to display   Procedures  /  Critical Care Procedures  ED Course and Medical Decision Making  I have reviewed the triage vital signs, the nursing notes, and pertinent available records from the EMR.  Listed above are laboratory and imaging tests that I personally ordered, reviewed, and interpreted and then considered in my medical decision making (see below for details).  Palpitations, EKG reassuring, patient on the cardiac monitor for a few  hours without any signs of arrhythmia.  Labs are normal with no electrolyte disturbance.  Possibly related to PACs or PVCs or related to new Coreg prescription, advised pausing this medicine and following up with PCP, no signs of emergencies today.       Elmer Sow. Pilar Plate, MD [facility] Emergency Medicine [facility] [email or web address]  Final Clinical Impressions(s) / ED Diagnoses     ICD-10-CM   1. Palpitations  R00.2     ED Discharge Orders    None  Discharge Instructions Discussed with and Provided to Patient:     Discharge Instructions     You were evaluated in the Emergency Department and after careful evaluation, we did not find any emergent condition requiring admission or further testing in the hospital.  Your exam/testing today was overall reassuring.  Your blood testing today was normal.  As discussed, we recommend that you pause your new Coreg medication and follow closely with your primary care doctor to discuss your symptoms and your blood pressure management.  Please return to the Emergency Department if you experience any worsening of your condition.  Thank you for allowing [place] to be a part of your care.       Sabas Sous, MD 12/27/19 2224

## 2019-12-27 NOTE — Discharge Instructions (Addendum)
You were evaluated in the Emergency Department and after careful evaluation, we did not find any emergent condition requiring admission or further testing in the hospital.  Your exam/testing today was overall reassuring.  Your blood testing today was normal.  As discussed, we recommend that you pause your new Coreg medication and follow closely with your primary care doctor to discuss your symptoms and your blood pressure management.  Please return to the Emergency Department if you experience any worsening of your condition.  Thank you for allowing [place] to be a part of your care.

## 2020-01-01 ENCOUNTER — Encounter: Payer: Self-pay | Admitting: Internal Medicine

## 2020-01-01 ENCOUNTER — Ambulatory Visit: Payer: PRIVATE HEALTH INSURANCE | Admitting: Internal Medicine

## 2020-01-01 ENCOUNTER — Other Ambulatory Visit: Payer: Self-pay

## 2020-01-01 VITALS — BP 156/98 | HR 70 | Temp 98.6°F | Resp 16 | Ht 65.0 in | Wt 267.0 lb

## 2020-01-01 DIAGNOSIS — R002 Palpitations: Secondary | ICD-10-CM

## 2020-01-01 DIAGNOSIS — E559 Vitamin D deficiency, unspecified: Secondary | ICD-10-CM | POA: Diagnosis not present

## 2020-01-01 DIAGNOSIS — I10 Essential (primary) hypertension: Secondary | ICD-10-CM

## 2020-01-01 DIAGNOSIS — Z0000 Encounter for general adult medical examination without abnormal findings: Secondary | ICD-10-CM

## 2020-01-01 DIAGNOSIS — R7303 Prediabetes: Secondary | ICD-10-CM

## 2020-01-01 DIAGNOSIS — Z1231 Encounter for screening mammogram for malignant neoplasm of breast: Secondary | ICD-10-CM

## 2020-01-01 LAB — LIPID PANEL
Cholesterol: 206 mg/dL — ABNORMAL HIGH (ref 0–200)
HDL: 57.7 mg/dL (ref >39.00)
LDL Cholesterol: 124 mg/dL — ABNORMAL HIGH (ref 0–99)
NonHDL: 148.5
Total CHOL/HDL Ratio: 4
Triglycerides: 123 mg/dL (ref 0.0–149.0)
VLDL: 24.6 mg/dL (ref 0.0–40.0)

## 2020-01-01 LAB — HEMOGLOBIN A1C: Hgb A1c MFr Bld: 6.1 % (ref 4.6–6.5)

## 2020-01-01 LAB — TSH: TSH: 0.95 u[IU]/mL (ref 0.35–4.50)

## 2020-01-01 LAB — VITAMIN D 25 HYDROXY (VIT D DEFICIENCY, FRACTURES): VITD: 28.54 ng/mL — ABNORMAL LOW (ref 30.00–100.00)

## 2020-01-01 MED ORDER — INDAPAMIDE 2.5 MG PO TABS: 2.5000 mg | ORAL_TABLET | Freq: Every day | ORAL | 0 refills | Status: DC

## 2020-01-01 NOTE — Patient Instructions (Signed)

## 2020-01-01 NOTE — Progress Notes (Signed)
Subjective:  Patient ID: [name], female    DOB: November 16, 1965  Age: 54 y.o. MRN: [id number]  CC: Annual Exam and Hypertension  This visit occurred during the SARS-CoV-2 public health emergency.  Safety protocols were in place, including screening questions prior to the visit, additional usage of staff PPE, and extensive cleaning of exam room while observing appropriate contact time as indicated for disinfecting solutions.    HPI [name] presents for a CPX.  She not felt well for several weeks with palpitations with a sensation of skipped heartbeat and lightheadedness.  She was seen at an urgent care center 10 days ago and then at the ED about 5 days ago and was advised to stop taking carvedilol.  Since she has not taken carvedilol for 5 days the palpitations have resolved.  She is active and denies chest pain, shortness of breath, diaphoresis, near-syncope, or syncope.  Outpatient Medications Prior to Visit  Medication Sig Dispense Refill  . acetaminophen (TYLENOL) 325 MG tablet Take 650 mg by mouth every 6 (six) hours as needed (for pain.).    Marland Kitchen famotidine (PEPCID) 40 MG tablet Take 1 tablet (40 mg total) by mouth at bedtime. 90 tablet 3  . levocetirizine (XYZAL) 5 MG tablet Take 2 tablets (10 mg total) by mouth every evening. 180 tablet 0  . methocarbamol (ROBAXIN) 750 MG tablet TAKE 1 TABLET(750 MG) BY MOUTH EVERY 8 HOURS AS NEEDED FOR MUSCLE SPASMS 65 tablet 2  . montelukast (SINGULAIR) 10 MG tablet TAKE 1 TABLET(10 MG) BY MOUTH AT BEDTIME 90 tablet 0  . omeprazole (PRILOSEC) 40 MG capsule Take 1 capsule (40 mg total) by mouth daily at 2 am. (0400) 90 capsule 1  . traMADol (ULTRAM) 50 MG tablet TAKE 1 TABLET(50 MG) BY MOUTH EVERY 6 HOURS AS NEEDED 65 tablet 3  . Ubrogepant (UBRELVY) 100 MG TABS Take 1 tablet by mouth daily as needed. 10 tablet 5  . Cholecalciferol (VITAMIN D3) 1.25 MG (50000 UT) CAPS Take 50,000 Units by mouth every Monday. 12 capsule 0  . carvedilol  (COREG) 6.25 MG tablet Take 1 tablet (6.25 mg total) by mouth 2 (two) times daily with a meal. 180 tablet 0   No facility-administered medications prior to visit.    ROS Review of Systems  Constitutional: Negative for chills, diaphoresis, fatigue, fever and unexpected weight change.  HENT: Negative.   Eyes: Negative for visual disturbance.  Respiratory: Positive for apnea. Negative for cough, chest tightness, shortness of breath and wheezing.   Cardiovascular: Positive for palpitations. Negative for chest pain and leg swelling.  Gastrointestinal: Negative for abdominal pain, constipation, diarrhea, nausea and vomiting.  Endocrine: Negative for cold intolerance and heat intolerance.  Genitourinary: Negative.  Negative for difficulty urinating.  Musculoskeletal: Negative.  Negative for arthralgias and myalgias.  Skin: Negative for color change, pallor and rash.  Neurological: Positive for light-headedness. Negative for dizziness and weakness.  Hematological: Negative for adenopathy. Does not bruise/bleed easily.  Psychiatric/Behavioral: Negative.     Objective:  BP (!) 156/98   Pulse 70   Temp 98.6 F (37 C) (Oral)   Resp 16   Ht 5\' 5"  (1.651 m)   Wt 267 lb (121.1 kg)   SpO2 97%   BMI 44.43 kg/m   BP Readings from Last 3 Encounters:  01/01/20 (!) 156/98  12/27/19 108/70  12/23/19 (!) 163/86    Wt Readings from Last 3 Encounters:  01/01/20 267 lb (121.1 kg)  12/27/19 275 lb (124.7 kg)  09/07/19  275 lb (124.7 kg)    Physical Exam Vitals reviewed.  Constitutional:      Appearance: Normal appearance.  HENT:     Nose: Nose normal.     Mouth/Throat:     Mouth: Mucous membranes are moist.  Eyes:     General: No scleral icterus.    Conjunctiva/sclera: Conjunctivae normal.  Cardiovascular:     Rate and Rhythm: Normal rate and regular rhythm.     Heart sounds: No murmur heard.   Pulmonary:     Effort: Pulmonary effort is normal.     Breath sounds: No stridor. No  wheezing, rhonchi or rales.  Abdominal:     General: Abdomen is protuberant. Bowel sounds are normal. There is no distension.     Palpations: Abdomen is soft. There is no hepatomegaly, splenomegaly or mass.     Tenderness: There is no abdominal tenderness.  Musculoskeletal:        General: Normal range of motion.     Cervical back: Neck supple.     Right lower leg: No edema.     Left lower leg: No edema.  Lymphadenopathy:     Cervical: No cervical adenopathy.  Skin:    General: Skin is warm and dry.     Coloration: Skin is not pale.  Neurological:     General: No focal deficit present.     Mental Status: She is alert.  Psychiatric:        Mood and Affect: Mood normal.        Behavior: Behavior normal.     Lab Results  Component Value Date   WBC 10.4 12/27/2019   HGB 12.9 12/27/2019   HCT 39.5 12/27/2019   PLT 302 12/27/2019   GLUCOSE 118 (H) 12/27/2019   CHOL 206 (H) 01/01/2020   TRIG 123.0 01/01/2020   HDL 57.70 01/01/2020   LDLCALC 124 (H) 01/01/2020   ALT 9 12/27/2019   AST 16 12/27/2019   NA 139 12/27/2019   K 3.9 12/27/2019   CL 104 12/27/2019   CREATININE 0.62 12/27/2019   BUN 9 12/27/2019   CO2 24 12/27/2019   TSH 0.95 01/01/2020   INR 1.0 07/06/2007   HGBA1C 6.1 01/01/2020    DG Chest Port 1 View  Result Date: 12/27/2019 CLINICAL DATA:  Palpitations EXAM: PORTABLE CHEST 1 VIEW COMPARISON:  07/02/2018 FINDINGS: Cardiomegaly. No focal opacity, pleural effusion or overt pulmonary edema. No pneumothorax. IMPRESSION: No active cardiopulmonary disease. Cardiomegaly. Electronically Signed   By: Jasmine Pang M.D.   On: 12/27/2019 21:51    Assessment & Plan:   Dalinda was seen today for annual exam and hypertension.  Diagnoses and all orders for this visit:  Essential hypertension, benign- Her blood pressure is not adequately well controlled.  I recommended that she start taking a thiazide diuretic. -     TSH; Future -     indapamide (LOZOL) 2.5 MG  tablet; Take 1 tablet (2.5 mg total) by mouth daily. -     TSH  Prediabetes- Her A1c is at 6.1%.  She is prediabetic.  Medical therapy is not indicated.  She is working on her lifestyle modifications. -     Hemoglobin A1c; Future -     Hemoglobin A1c  Routine general medical examination at a health care facility- Exam completed, labs reviewed, vaccines reviewed and updated, cancer screenings addressed, patient education was given. -     Lipid panel; Future -     HIV Antibody (routine testing w rflx);  Future -     Lipid panel -     HIV Antibody (routine testing w rflx)  Vitamin D deficiency -     VITAMIN D 25 Hydroxy (Vit-D Deficiency, Fractures); Future -     VITAMIN D 25 Hydroxy (Vit-D Deficiency, Fractures) -     Cholecalciferol 50 MCG (2000 UT) TABS; Take 1 tablet (2,000 Units total) by mouth daily.  Intermittent palpitations- Her palpitations resolved since she stopped taking carvedilol.  Her only associated symptom was lightheadedness.  This is indicative of benign dysrhythmia associated with beta-blocker therapy.  Her labs are negative for secondary causes.  If the palpitations returned then I will order a cardiac event monitor.   I have discontinued Chrisette Senegal's Vitamin D3 and carvedilol. I am also having her start on indapamide and Cholecalciferol. Additionally, I am having her maintain her Ubrelvy, acetaminophen, famotidine, methocarbamol, traMADol, levocetirizine, omeprazole, and montelukast.  Meds ordered this encounter  Medications  . indapamide (LOZOL) 2.5 MG tablet    Sig: Take 1 tablet (2.5 mg total) by mouth daily.    Dispense:  90 tablet    Refill:  0  . Cholecalciferol 50 MCG (2000 UT) TABS    Sig: Take 1 tablet (2,000 Units total) by mouth daily.    Dispense:  90 tablet    Refill:  3   In addition to time spent on CPE, I spent 50 minutes in preparing to see the patient by review of recent labs, imaging and procedures, obtaining and reviewing separately  obtained history, communicating with the patient and family or caregiver, ordering medications, tests or procedures, and documenting clinical information in the EHR including the differential Dx, treatment, and any further evaluation and other management of 1. Essential hypertension, benign 2. Prediabetes 3. Vitamin D deficiency 4. Intermittent palpitations      Follow-up: Return in about 2 months (around 03/02/2020).  Sanda Linger, MD

## 2020-01-02 LAB — HIV ANTIBODY (ROUTINE TESTING W REFLEX): HIV 1&2 Ab, 4th Generation: NONREACTIVE

## 2020-01-02 MED ORDER — CHOLECALCIFEROL 50 MCG (2000 UT) PO TABS: 1.0000 | ORAL_TABLET | Freq: Every day | ORAL | 3 refills | Status: DC

## 2020-01-19 IMAGING — US ULTRASOUND ABDOMEN LIMITED
1 series · 14 of 25 positions shown · non-contrast
Comparison: CT abdomen pelvis dated November 29, 2016.

CLINICAL DATA: Right upper quadrant pain radiating to the back for
the past 2 weeks.

EXAM:
ULTRASOUND ABDOMEN LIMITED RIGHT UPPER QUADRANT

[Series 1: ultrasound abdomen limited · 14 of 34 slices shown]
[im 1/34]
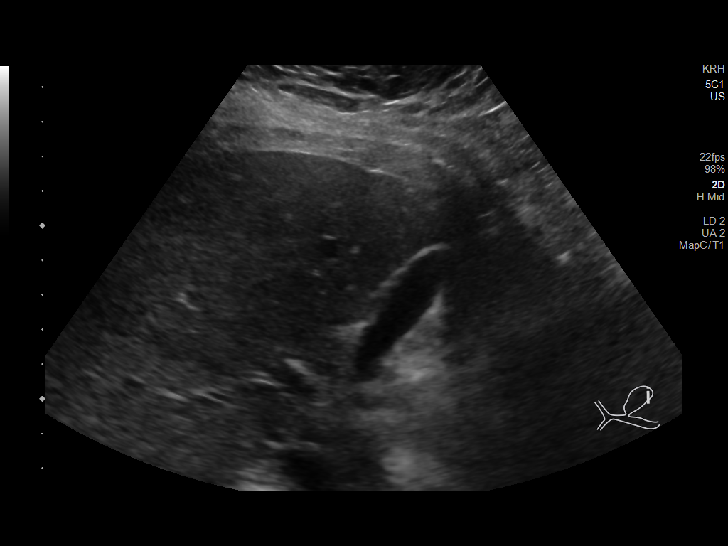
[im 3/34]
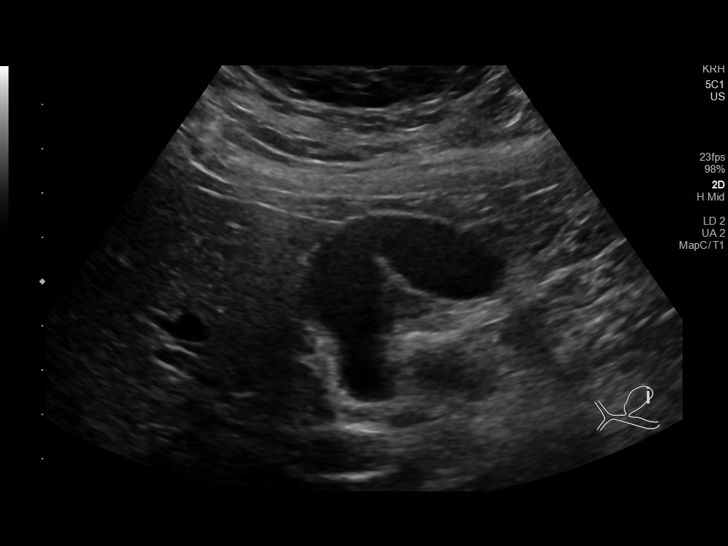
[im 6/34]
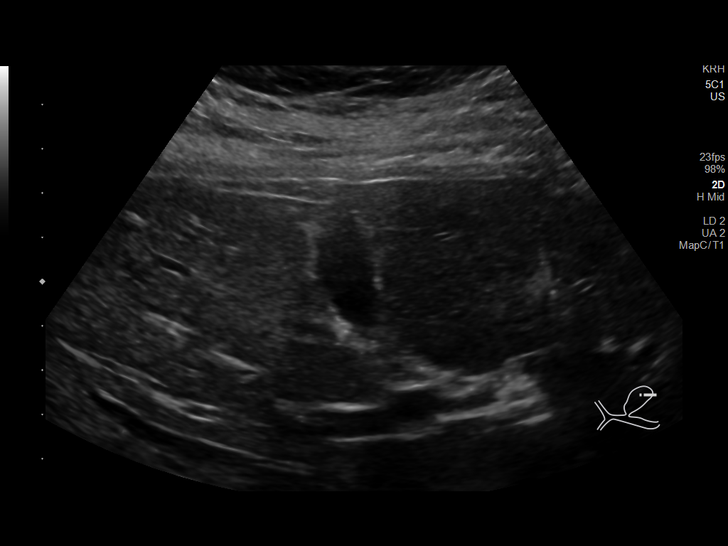
[im 9/34]
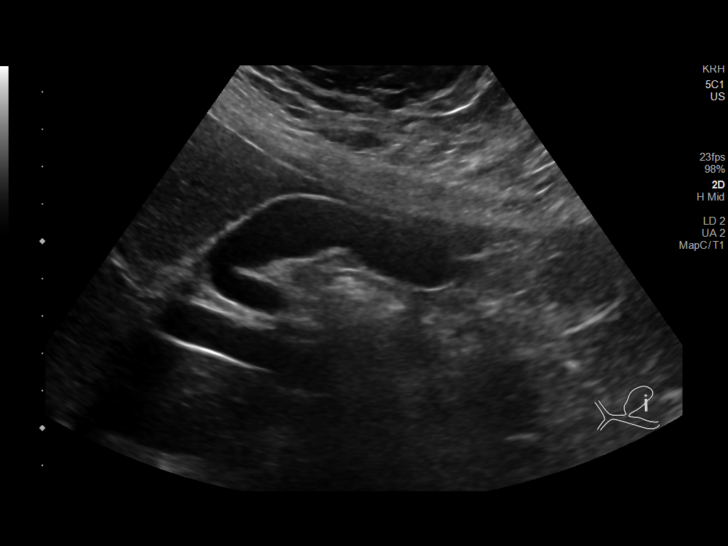
[im 12/34]
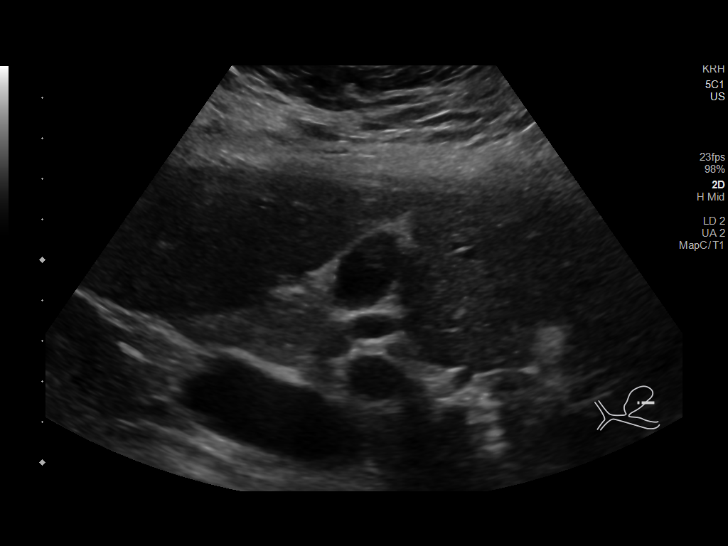
[im 13/34]
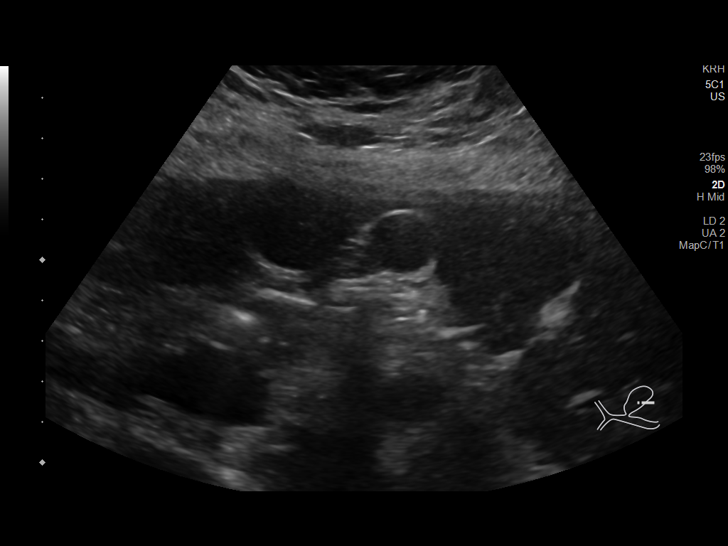
[im 16/34]
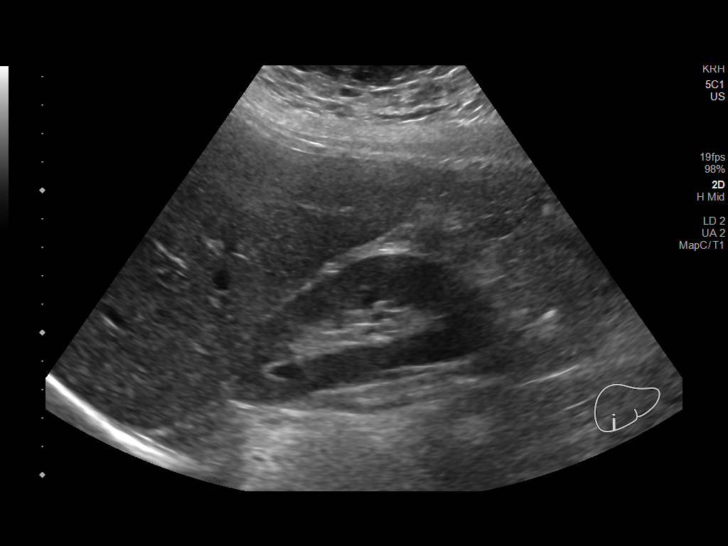
[im 18/34]
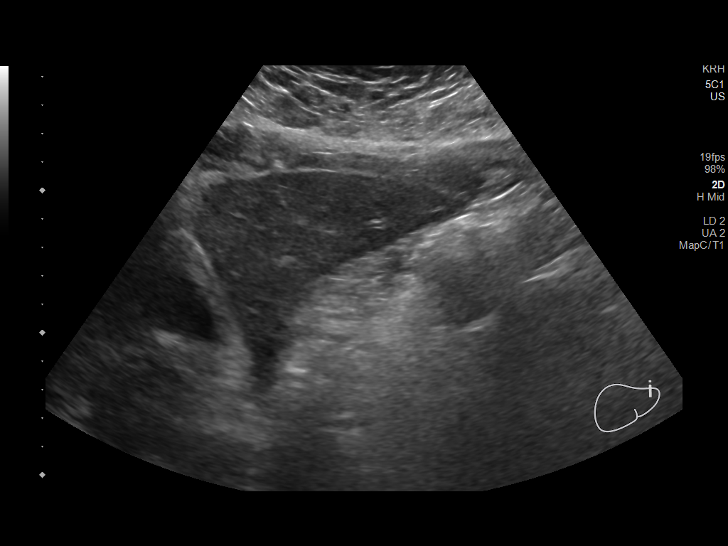
[im 21/34]
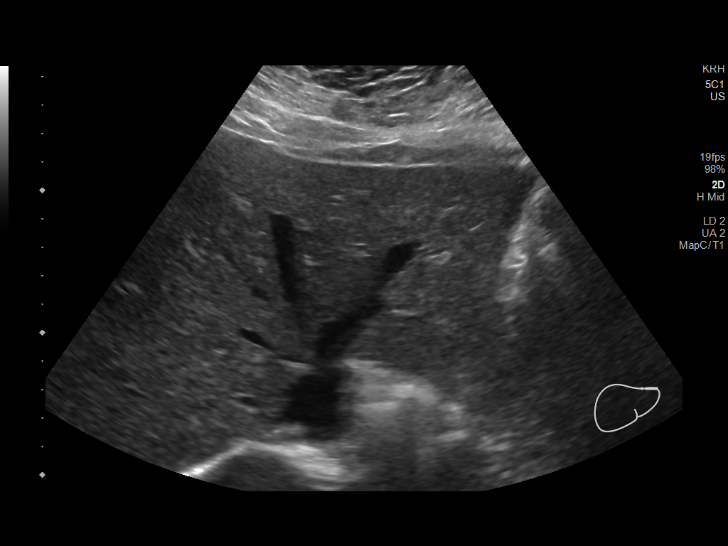
[im 23/34]
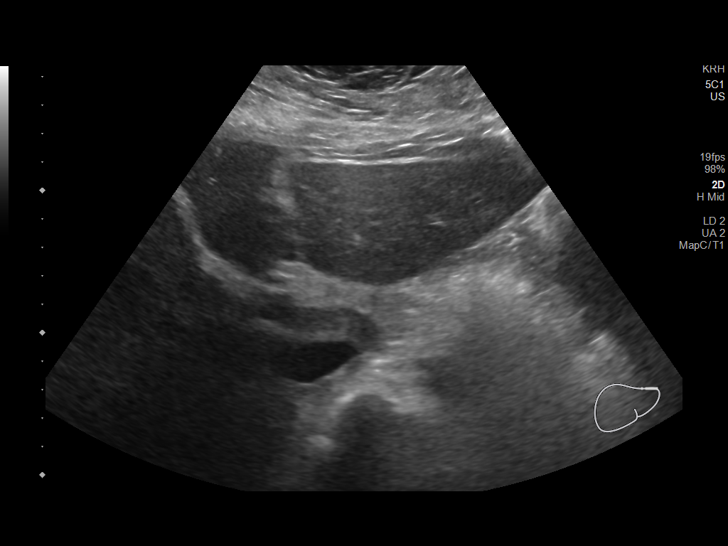
[im 25/34]
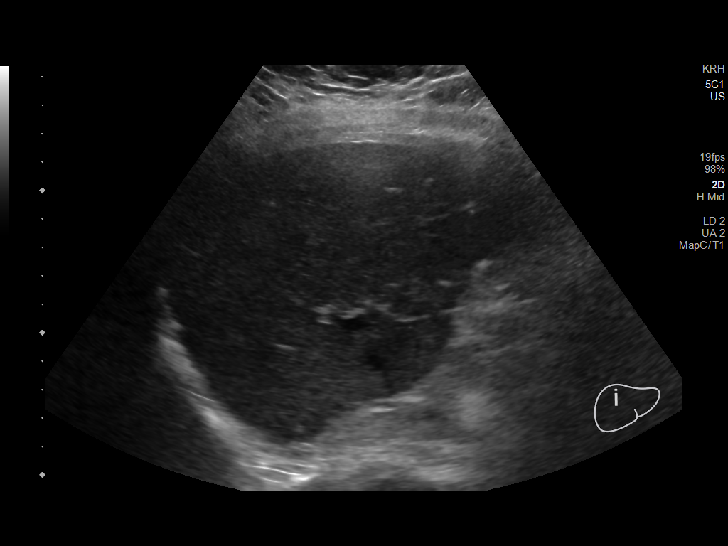
[im 28/34]
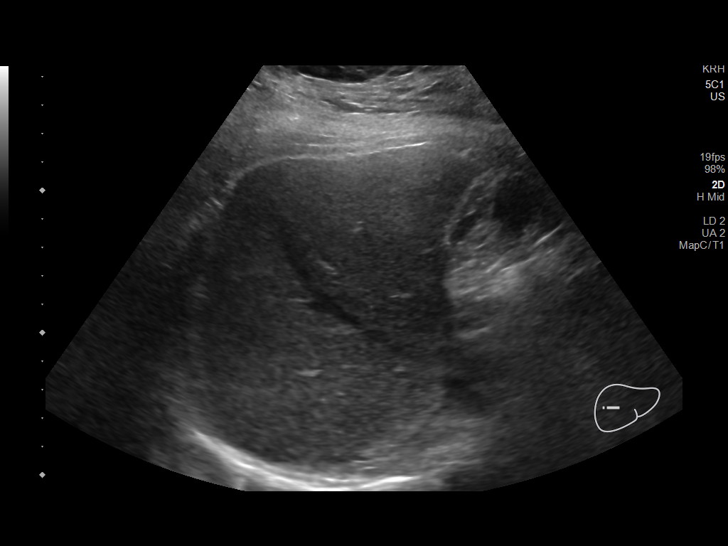
[im 31/34]
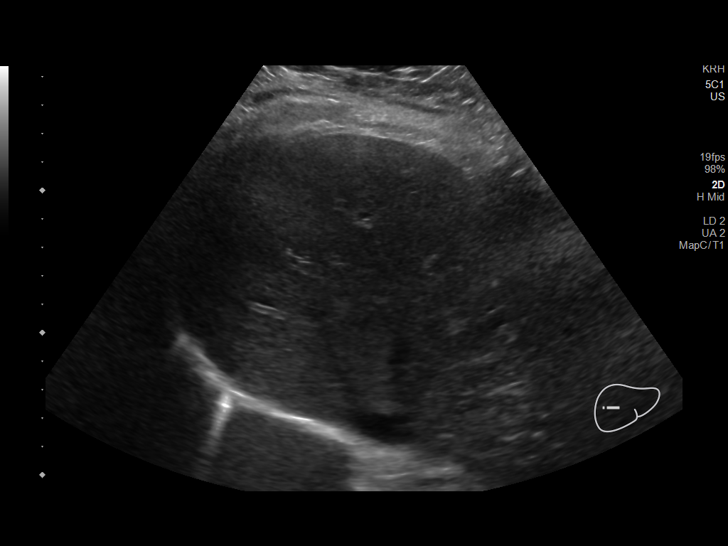
[im 34/34]
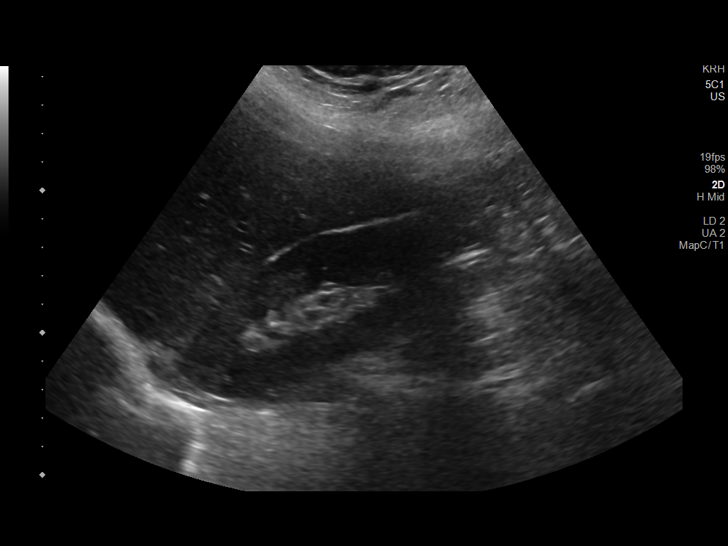

[14 of 25 positions shown; findings below may reference images not displayed]

FINDINGS: Gallbladder:

No gallstones or wall thickening visualized. No sonographic Murphy
sign noted by sonographer.

Common bile duct:

Diameter: 3 mm, normal.

Liver:

No focal lesion identified. Within normal limits in parenchymal
echogenicity. Portal vein is patent on color Doppler imaging with
normal direction of blood flow towards the liver.

Other: None.
IMPRESSION: Normal right upper quadrant ultrasound.

## 2020-01-26 ENCOUNTER — Encounter: Payer: Self-pay | Admitting: Internal Medicine

## 2020-01-26 ENCOUNTER — Telehealth: Payer: Self-pay | Admitting: Internal Medicine

## 2020-01-26 NOTE — Telephone Encounter (Signed)
LVM stating letter was ready and asking is she would like to pick it up or have it mailed to her home.

## 2020-01-26 NOTE — Telephone Encounter (Signed)
   Patient calling to request a letter stating she has had an allergic reaction in the past to the pneumonia vaccine. Her employer is requiring pneumonia vaccine.  Patient is requesting a letter to exempt her from taking vaccine. Please advise

## 2020-01-30 NOTE — Telephone Encounter (Signed)
Letter has been placed at check in desk

## 2020-01-30 NOTE — Telephone Encounter (Signed)
Follow up message  Patient will pick up letter

## 2020-02-19 ENCOUNTER — Other Ambulatory Visit: Payer: Self-pay | Admitting: Internal Medicine

## 2020-02-19 DIAGNOSIS — E559 Vitamin D deficiency, unspecified: Secondary | ICD-10-CM

## 2020-03-11 ENCOUNTER — Encounter: Payer: Self-pay | Admitting: Internal Medicine

## 2020-03-11 NOTE — Progress Notes (Deleted)
Cardiology Office Note    Date:  03/11/2020   ID:  [name], DOB [birth year], MRN [id number]  PCP:  [name], MD  Cardiologist: Kristeen Miss, MD EPS: None  No chief complaint on file.   History of Present Illness:  [name] is a 55 y.o. female with history of hypertension, chest pain that improved with Dexilant, palpitations.  Last saw Dr. Elease Hashimoto in 06/2017 and doing well.  Back in October she was having increasing palpitation after being started on Coreg and was seen in the ED 12/27/2019 at which time they told her to stop carvedilol which was started in the ED 12/23/2019 for PVCs seen on EKG and palpitations.  Follow-up with PCP palpitations resolved off carvedilol but blood pressure was running high so started on Lozol 2.5 mg daily.  Was previously on Bystolic 10 mg daily.  Past Medical History:  Diagnosis Date  . Arthritis   . COVID-19 06/2018  . Esophageal stenosis   . Esophagitis   . GERD (gastroesophageal reflux disease)   . Hiatal hernia   . Hypertension   . Iron deficiency anemia   . Migraines   . Morbid obesity (HCC)   . Sleep apnea         Past Surgical History:  Procedure Laterality Date  . ABDOMINAL HYSTERECTOMY    . ABDOMINAL HYSTERECTOMY  2003  . CARDIOVASCULAR STRESS TEST  07/25/2007   Negative stress echo; ef 55-60%  . ESOPHAGEAL MANOMETRY  03/07/2012   Procedure: ESOPHAGEAL MANOMETRY (EM);  Surgeon: Mardella Layman, MD;  Location: [facility];  Service: Endoscopy;  Laterality: N/A;  . ESOPHAGEAL MANOMETRY N/A 06/08/2019   Procedure: ESOPHAGEAL MANOMETRY (EM);  Surgeon: Iva Boop, MD;  Location: [facility];  Service: Endoscopy;  Laterality: N/A;  . ESOPHAGOGASTRODUODENOSCOPY    . ESOPHAGOGASTRODUODENOSCOPY    . ESOPHAGOGASTRODUODENOSCOPY (EGD) WITH PROPOFOL N/A 06/08/2019   Procedure: ESOPHAGOGASTRODUODENOSCOPY (EGD) WITH PROPOFOL;  Surgeon: Iva Boop, MD;  Location: [facility];  Service: Endoscopy;  Laterality:  N/A;  . KNEE ARTHROSCOPY  01/2011   left  . KNEE SURGERY      Current Medications: No outpatient medications have been marked as taking for the 03/13/20 encounter (Appointment) with Dyann Kief, PA-C.     Allergies:   Oxycodone and Pneumococcal vaccines   Social History   Socioeconomic History  . Marital status: Married    Spouse name: Not on file  . Number of children: 0  . Years of education: Not on file  . Highest education level: Not on file  Occupational History  . Occupation: Scientist, research (medical): [employer]  Tobacco Use  . Smoking status: Never Smoker  . Smokeless tobacco: Never Used  Vaping Use  . Vaping Use: Never used  Substance and Sexual Activity  . Alcohol use: No  . Drug use: No  . Sexual activity: Not Currently    Birth control/protection: Surgical  Other Topics Concern  . 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   ROS:   Please see the history of present illness.    ROS All other systems reviewed and are negative.   PHYSICAL EXAM:   VS:  There were no vitals taken for this visit.  Physical Exam  GEN: Well nourished, well developed, in no acute distress  HEENT: normal  Neck: no JVD, carotid bruits, or masses Cardiac:RRR; no murmurs, rubs, or gallops  Respiratory:  clear to auscultation bilaterally, normal work of breathing GI: soft, nontender, nondistended, + BS Ext: without cyanosis, clubbing, or edema, Good distal pulses bilaterally MS: no deformity or atrophy  Skin: warm and dry, no  rash Neuro:  Alert and Oriented x 3, Strength and sensation are intact Psych: euthymic mood, full affect  Wt Readings from Last 3 Encounters:  01/01/20 267 lb (121.1 kg)  12/27/19 275 lb (124.7 kg)  09/07/19 275 lb (124.7 kg)      Studies/Labs Reviewed:   EKG:  EKG is*** ordered today.  The ekg ordered today demonstrates ***  Recent Labs: 12/27/2019: ALT 9; BUN 9; Creatinine, Ser 0.62; Hemoglobin 12.9; Platelets 302; Potassium 3.9; Sodium 139 01/01/2020: TSH 0.95   Lipid Panel    Component Value Date/Time   CHOL 206 (H) 01/01/2020 1515   TRIG 123.0 01/01/2020 1515   HDL 57.70 01/01/2020 1515   CHOLHDL 4 01/01/2020 1515   VLDL 24.6 01/01/2020 1515   LDLCALC 124 (H) 01/01/2020 1515    Additional studies/ records that were reviewed today include:  ***   Risk Assessment/Calculations:   {Does this patient have ATRIAL FIBRILLATION?:[phone]}     ASSESSMENT:    No diagnosis found.   PLAN:  In order of problems listed above:  Palpitations worsen with carvedilol.  Previously on Bystolic  Hypertension  Prediabetes   Shared Decision Making/Informed Consent   {Are you ordering a CV Procedure (e.g. stress test, cath, DCCV, TEE, etc)?   Press F2        :[id number]}    Medication Adjustments/Labs and Tests Ordered: Current medicines are reviewed at length with the patient today.  Concerns regarding medicines are outlined above.  Medication changes, Labs and Tests ordered today are listed in the Patient Instructions below. There are no Patient Instructions on file for this visit.   Elson Clan, PA-C  03/11/2020 12:38 PM    [facility] [address] [place], [place], [place]  [address] Phone: [phone]; Fax: [phone]

## 2020-03-13 ENCOUNTER — Ambulatory Visit: Payer: PRIVATE HEALTH INSURANCE | Admitting: Physician Assistant

## 2020-03-21 ENCOUNTER — Other Ambulatory Visit: Payer: Self-pay | Admitting: Internal Medicine

## 2020-03-21 DIAGNOSIS — I10 Essential (primary) hypertension: Secondary | ICD-10-CM

## 2020-03-25 ENCOUNTER — Other Ambulatory Visit: Payer: Self-pay | Admitting: Internal Medicine

## 2020-03-25 DIAGNOSIS — I10 Essential (primary) hypertension: Secondary | ICD-10-CM

## 2020-03-25 DIAGNOSIS — M545 Low back pain, unspecified: Secondary | ICD-10-CM

## 2020-03-25 DIAGNOSIS — M79604 Pain in right leg: Secondary | ICD-10-CM

## 2020-04-08 ENCOUNTER — Encounter: Payer: Self-pay | Admitting: Cardiovascular Disease

## 2020-04-08 NOTE — Progress Notes (Signed)
Cardiology Office Note:    Date:  04/09/2020   ID:  [name], DOB 06/11/1965, MRN [id number]  PCP:  [name], MD  Cardiologist:  Kristeen Miss, MD   Referring MD: [name], MD   Chief Complaint  Patient presents with  . Chest Pain    Previous notes from 06/09/17    [name] is a 55 y.o. female with a hx of hypertension.  She has been recently having some atypical episodes of chest pain. She was recently seen in the emergency room for atypical chest pain.  A CT scan ruled out pulmonary embolus.  She also ruled out for myocardial infarction.  She describes pain as a aching and heartburn-like sensation underneath her breastbone.   Was started on Dexilant by her primary MD and the pains have resolved.   She has been under lots of stress.  Was found to have a low vit D level, was not getting nearly enough sleep. ( 10 hours of sleep in a week)   Still has palpitations .  Is sleeping a bit better.   Works 3rd shift at [employer]   Has been getting some exercise.   Is limited by some issues with her right foot.  Was up to 5 miles a day   Feb. 1, 2022:  [name] is seen today follow up of her HTN, atypical CP, Has been having more heart palps She had stopped her Bystolic due to insurance not paying for the med. She tried Carvedilol but she felt worse  She has tried metoprolol in the past -  Is now on Indapamide  ( is not on a potassium replacement )  Having leg cramps    Past Medical History:  Diagnosis Date  . Arthritis   . COVID-19 06/2018  . Esophageal stenosis   . Esophagitis   . GERD (gastroesophageal reflux disease)   . Hiatal hernia   . Hypertension   . Iron deficiency anemia   . Migraines   . Morbid obesity (HCC)   . Sleep apnea         Past Surgical History:  Procedure Laterality Date  . ABDOMINAL HYSTERECTOMY    . ABDOMINAL HYSTERECTOMY  2003  . CARDIOVASCULAR STRESS TEST  07/25/2007   Negative stress echo; ef 55-60%   . ESOPHAGEAL MANOMETRY  03/07/2012   Procedure: ESOPHAGEAL MANOMETRY (EM);  Surgeon: Mardella Layman, MD;  Location: [facility];  Service: Endoscopy;  Laterality: N/A;  . ESOPHAGEAL MANOMETRY N/A 06/08/2019   Procedure: ESOPHAGEAL MANOMETRY (EM);  Surgeon: Iva Boop, MD;  Location: [facility];  Service: Endoscopy;  Laterality: N/A;  . ESOPHAGOGASTRODUODENOSCOPY    . ESOPHAGOGASTRODUODENOSCOPY    . ESOPHAGOGASTRODUODENOSCOPY (EGD) WITH PROPOFOL N/A 06/08/2019   Procedure: ESOPHAGOGASTRODUODENOSCOPY (EGD) WITH PROPOFOL;  Surgeon: Iva Boop, MD;  Location: [facility];  Service: Endoscopy;  Laterality: N/A;  . KNEE ARTHROSCOPY  01/2011   left  . KNEE SURGERY      Current Medications: Current Meds  Medication Sig  . acetaminophen (TYLENOL) 325 MG tablet Take 650 mg by mouth every 6 (six) hours as needed (for pain.).  Marland Kitchen Cholecalciferol 50 MCG (2000 UT) TABS Take 1 tablet (2,000 Units total) by mouth daily.  . famotidine (PEPCID) 40 MG tablet Take 1 tablet (40 mg total) by mouth at bedtime.  . indapamide (LOZOL) 2.5 MG tablet TAKE 1 TABLET(2.5 MG) BY MOUTH DAILY  . methocarbamol (ROBAXIN) 750 MG tablet TAKE 1 TABLET(750 MG) BY MOUTH EVERY  8 HOURS AS NEEDED FOR MUSCLE SPASMS  . metoprolol tartrate (LOPRESSOR) 25 MG tablet Take 1 tablet (25 mg total) by mouth 2 (two) times daily.  . montelukast (SINGULAIR) 10 MG tablet TAKE 1 TABLET(10 MG) BY MOUTH AT BEDTIME  . omeprazole (PRILOSEC) 40 MG capsule Take 1 capsule (40 mg total) by mouth daily at 2 am. (0400)  . potassium chloride SA (KLOR-CON) 20 MEQ tablet Take 1 tablet (20 mEq total) by mouth daily.  . traMADol (ULTRAM) 50 MG tablet TAKE 1 TABLET(50 MG) BY MOUTH EVERY 6 HOURS AS NEEDED  . Ubrogepant (UBRELVY) 100 MG TABS Take 1 tablet by mouth daily as needed.     Allergies:   Oxycodone and Pneumococcal vaccines   Social History   Socioeconomic History  . Marital status: Married    Spouse name: Not on file  . Number of  children: 0  . Years of education: Not on file  . Highest education level: Not on file  Occupational History  . Occupation: Scientist, research (medical): [employer]  Tobacco Use  . Smoking status: Never Smoker  . Smokeless tobacco: Never Used  Vaping Use  . Vaping Use: Never used  Substance and Sexual Activity  . Alcohol use: No  . Drug use: No  . Sexual activity: Not Currently    Birth control/protection: Surgical  Other Topics Concern  . 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 There is no history of Heart disease, Cancer, Alcohol abuse, Early death, Hearing loss, Hyperlipidemia, Kidney disease, or Stroke.  ROS:   Please see the history of present illness.     All other systems reviewed and are negative.  EKGs/Labs/Other Studies Reviewed:    The following studies were reviewed today:   EKG:     Recent Labs: 12/27/2019: ALT 9; BUN 9; Creatinine, Ser 0.62; Hemoglobin 12.9; Platelets 302; Potassium 3.9; Sodium 139 01/01/2020: TSH 0.95  Recent Lipid Panel    Component Value Date/Time   CHOL 206 (H) 01/01/2020 1515   TRIG 123.0 01/01/2020 1515   HDL 57.70 01/01/2020 1515   CHOLHDL 4 01/01/2020 1515   VLDL 24.6 01/01/2020 1515   LDLCALC 124 (H) 01/01/2020 1515    Physical Exam:    Physical Exam: Blood pressure 140/86, pulse 93, height 5\' 5"  (1.651 m), weight 269 lb (122 kg), SpO2 96 %.  GEN:    Middle age female,  Moderate- morbid obesity  HEENT: Normal NECK: No JVD; No carotid bruits LYMPHATICS: No  lymphadenopathy CARDIAC: RRR , no murmurs, rubs, gallops RESPIRATORY:  Clear to auscultation without rales, wheezing or rhonchi  ABDOMEN: Soft, non-tender, non-distended MUSCULOSKELETAL:  No edema; No deformity  SKIN: Warm and dry NEUROLOGIC:  Alert and oriented x 3   ASSESSMENT:    1. Palpitations    PLAN:    In order of problems listed above:  1.   Palpitations: Verda presents for further evaluation of palpitations.  She incidentally told me that she is also having leg cramps.  She was started on indapamide but with no potassium supplementation several months ago.  I suspect that her potassium levels are low.  We will add potassium chloride 20 mEq a day.  We will go ahead and check her basic metabolic profile today.  Because she is tachycardic I would also add metoprolol 25 mg twice a day. We will have her return to see an APP in 2 to 3 months.  3.  Hypertension: Blood pressure is mildly elevated.  I encouraged her to work on diet, exercise, weight loss.  We will be adding metoprolol which might help slightly with her blood pressure issues.  .   Medication Adjustments/Labs and Tests Ordered: Current medicines are reviewed at length with the patient today.  Concerns regarding medicines are outlined above.  Orders Placed This Encounter  Procedures  . Basic metabolic panel   Meds ordered this encounter  Medications  . potassium chloride SA (KLOR-CON) 20 MEQ tablet    Sig: Take 1 tablet (20 mEq total) by mouth daily.    Dispense:  90 tablet    Refill:  3  . metoprolol tartrate (LOPRESSOR) 25 MG tablet    Sig: Take 1 tablet (25 mg total) by mouth 2 (two) times daily.    Dispense:  180 tablet    Refill:  3    Signed, Kristeen Miss, MD  04/09/2020 5:21 PM    [facility]

## 2020-04-09 ENCOUNTER — Other Ambulatory Visit: Payer: Self-pay

## 2020-04-09 ENCOUNTER — Ambulatory Visit: Payer: PRIVATE HEALTH INSURANCE | Admitting: Cardiovascular Disease

## 2020-04-09 ENCOUNTER — Encounter: Payer: Self-pay | Admitting: Cardiovascular Disease

## 2020-04-09 VITALS — BP 140/86 | HR 93 | Ht 65.0 in | Wt 269.0 lb

## 2020-04-09 DIAGNOSIS — R252 Cramp and spasm: Secondary | ICD-10-CM | POA: Insufficient documentation

## 2020-04-09 DIAGNOSIS — I10 Essential (primary) hypertension: Secondary | ICD-10-CM

## 2020-04-09 DIAGNOSIS — R002 Palpitations: Secondary | ICD-10-CM | POA: Diagnosis not present

## 2020-04-09 MED ORDER — POTASSIUM CHLORIDE CRYS ER 20 MEQ PO TBCR: 20.0000 meq | EXTENDED_RELEASE_TABLET | Freq: Every day | ORAL | 3 refills | Status: DC

## 2020-04-09 MED ORDER — METOPROLOL TARTRATE 25 MG PO TABS: 25.0000 mg | ORAL_TABLET | Freq: Two times a day (BID) | ORAL | 3 refills | Status: DC

## 2020-04-09 NOTE — Patient Instructions (Signed)
Medication Instructions:  Your physician has recommended you make the following change in your medication:  START: 1. Potassium daily(1 tablet) 2. Metoprolol Tartrate 25mg  (1 tablet twice a day)   *If you need a refill on your cardiac medications before your next appointment, please call your pharmacy*   Lab Work: Today (BMET) If you have labs (blood work) drawn today and your tests are completely normal, you will receive your results only by: MyChart Message (if you have MyChart) OR . A paper copy in the mail If you have any lab test that is abnormal or we need to change your treatment, we will call you to review the results.   Testing/Procedures: none   Follow-Up: At [facility], you and your health needs are our priority.  As part of our continuing mission to provide you with exceptional heart care, we have created designated Provider Care Teams.  These Care Teams include your primary Cardiologist (physician) and Advanced Practice Providers (APPs -  Physician Assistants and Nurse Practitioners) who all work together to provide you with the care you need, when you need it.  Your next appointment:   2-3 month(s)  The format for your next appointment:   In Person  Provider:   You will see one of the following Advanced Practice Providers on your designated Care Team:    [facility], PA-C  Vin Winifred, [place]

## 2020-04-10 LAB — BASIC METABOLIC PANEL
BUN/Creatinine Ratio: 15 (ref 9–23)
BUN: 10 mg/dL (ref 6–24)
CO2: 28 mmol/L (ref 20–29)
Calcium: 9.4 mg/dL (ref 8.7–10.2)
Chloride: 99 mmol/L (ref 96–106)
Creatinine, Ser: 0.66 mg/dL (ref 0.57–1.00)
GFR calc Af Amer: 115 mL/min/{1.73_m2} (ref >59)
GFR calc non Af Amer: 100 mL/min/{1.73_m2} (ref >59)
Glucose: 102 mg/dL — ABNORMAL HIGH (ref 65–99)
Potassium: 3.8 mmol/L (ref 3.5–5.2)
Sodium: 141 mmol/L (ref 134–144)

## 2020-04-11 ENCOUNTER — Telehealth: Payer: Self-pay

## 2020-04-11 DIAGNOSIS — R002 Palpitations: Secondary | ICD-10-CM

## 2020-04-11 NOTE — Telephone Encounter (Signed)
-----   Message from Vesta Mixer, MD sent at 04/11/2020  8:05 AM EST ----- As expected, her potassium level was low and I expect her palpitations are related to her low potassium level.  We have already added Kdur 20 meq a day .  She should cont this . Lets recheck BMP in 3 weeks ( this might have been ordered already ??)

## 2020-04-11 NOTE — Telephone Encounter (Signed)
The patient has been notified of the result and verbalized understanding.  All questions (if any) were answered. Theresia Majors, RN 04/11/2020 8:49 AM  Patient will continue Kdur and repeat labs on 2/24.

## 2020-05-02 ENCOUNTER — Other Ambulatory Visit: Payer: Self-pay

## 2020-05-02 ENCOUNTER — Other Ambulatory Visit: Payer: PRIVATE HEALTH INSURANCE

## 2020-05-02 DIAGNOSIS — R002 Palpitations: Secondary | ICD-10-CM

## 2020-05-02 LAB — BASIC METABOLIC PANEL
BUN/Creatinine Ratio: 15 (ref 9–23)
BUN: 12 mg/dL (ref 6–24)
CO2: 24 mmol/L (ref 20–29)
Calcium: 9.8 mg/dL (ref 8.7–10.2)
Chloride: 98 mmol/L (ref 96–106)
Creatinine, Ser: 0.78 mg/dL (ref 0.57–1.00)
GFR calc Af Amer: 99 mL/min/{1.73_m2} (ref >59)
GFR calc non Af Amer: 86 mL/min/{1.73_m2} (ref >59)
Glucose: 88 mg/dL (ref 65–99)
Potassium: 3.8 mmol/L (ref 3.5–5.2)
Sodium: 137 mmol/L (ref 134–144)

## 2020-05-06 ENCOUNTER — Other Ambulatory Visit: Payer: Self-pay | Admitting: Internal Medicine

## 2020-05-06 DIAGNOSIS — M545 Low back pain, unspecified: Secondary | ICD-10-CM

## 2020-05-06 DIAGNOSIS — M79604 Pain in right leg: Secondary | ICD-10-CM

## 2020-05-06 DIAGNOSIS — M25551 Pain in right hip: Secondary | ICD-10-CM

## 2020-05-06 DIAGNOSIS — M25552 Pain in left hip: Secondary | ICD-10-CM

## 2020-05-09 ENCOUNTER — Other Ambulatory Visit: Payer: Self-pay | Admitting: Internal Medicine

## 2020-05-09 DIAGNOSIS — I10 Essential (primary) hypertension: Secondary | ICD-10-CM

## 2020-06-23 ENCOUNTER — Encounter: Payer: Self-pay | Admitting: Cardiovascular Disease

## 2020-06-23 NOTE — Progress Notes (Signed)
This encounter was created in error - please disregard.

## 2020-06-24 ENCOUNTER — Encounter: Payer: PRIVATE HEALTH INSURANCE | Admitting: Cardiovascular Disease

## 2020-07-01 ENCOUNTER — Encounter: Payer: Self-pay | Admitting: Cardiovascular Disease

## 2020-07-01 ENCOUNTER — Ambulatory Visit: Payer: PRIVATE HEALTH INSURANCE | Admitting: Cardiovascular Disease

## 2020-07-01 ENCOUNTER — Other Ambulatory Visit: Payer: Self-pay

## 2020-07-01 VITALS — BP 140/80 | HR 68 | Ht 65.0 in | Wt 267.2 lb

## 2020-07-01 DIAGNOSIS — I10 Essential (primary) hypertension: Secondary | ICD-10-CM

## 2020-07-01 MED ORDER — SPIRONOLACTONE 25 MG PO TABS: 25.0000 mg | ORAL_TABLET | Freq: Every day | ORAL | 3 refills | Status: DC

## 2020-07-01 NOTE — Patient Instructions (Addendum)
Medication Instructions:  Your physician has recommended you make the following change in your medication:   STOP potassium STOP Indapamide START Spironolactone 25mg  daily   *If you need a refill on your cardiac medications before your next appointment, please call your pharmacy*   Lab Work: BMET    07/16/2020 anytime between 7:30-4:30 Your physician recommends that you return for lab work in: 2-3 weeks    Testing/Procedures: none   Follow-Up: At [facility], you and your health needs are our priority.  As part of our continuing mission to provide you with exceptional heart care, we have created designated Provider Care Teams.  These Care Teams include your primary Cardiologist (physician) and Advanced Practice Providers (APPs -  Physician Assistants and Nurse Practitioners) who all work together to provide you with the care you need, when you need it.  We recommend signing up for the patient portal called "MyChart".  Sign up information is provided on this After Visit Summary.  MyChart is used to connect with patients for Virtual Visits (Telemedicine).  Patients are able to view lab/test results, encounter notes, upcoming appointments, etc.  Non-urgent messages can be sent to your provider as well.   To learn more about what you can do with MyChart, go to [facility].    Your next appointment:   1 year(s)  The format for your next appointment:   In Person  Provider:   You may see [email or web address], MD or one of the following Advanced Practice Providers on your designated Care Team:    Kristeen Miss, PA-C  Vin [place], [place]

## 2020-07-01 NOTE — Progress Notes (Signed)
Cardiology Office Note:    Date:  07/01/2020   ID:  [name], DOB 20-Jun-1965, MRN [id number]  PCP:  [name], MD  Cardiologist:  Kristeen Miss, MD   Referring MD: [name], MD   Chief Complaint  Patient presents with  . Hypertension  . Palpitations    Previous notes from 06/09/17    [name] is a 55 y.o. female with a hx of hypertension.  She has been recently having some atypical episodes of chest pain. She was recently seen in the emergency room for atypical chest pain.  A CT scan ruled out pulmonary embolus.  She also ruled out for myocardial infarction.  She describes pain as a aching and heartburn-like sensation underneath her breastbone.   Was started on Dexilant by her primary MD and the pains have resolved.   She has been under lots of stress.  Was found to have a low vit D level, was not getting nearly enough sleep. ( 10 hours of sleep in a week)   Still has palpitations .  Is sleeping a bit better.   Works 3rd shift at [employer]   Has been getting some exercise.   Is limited by some issues with her right foot.  Was up to 5 miles a day   Feb. 1, 2022:  [name] is seen today follow up of her HTN, atypical CP, Has been having more heart palps She had stopped her Bystolic due to insurance not paying for the med. She tried Carvedilol but she felt worse  She has tried metoprolol in the past -  Is now on Indapamide  ( is not on a potassium replacement )  Having leg cramps    June 24, 2020 [name] is seen today for follow up of her HTN, palpitations No CP  Is exercising ,  No cp or dyspnea with that.   Past Medical History:  Diagnosis Date  . Arthritis   . COVID-19 06/2018  . Esophageal stenosis   . Esophagitis   . GERD (gastroesophageal reflux disease)   . Hiatal hernia   . Hypertension   . Iron deficiency anemia   . Migraines   . Morbid obesity (HCC)   . Sleep apnea         Past Surgical History:  Procedure  Laterality Date  . ABDOMINAL HYSTERECTOMY    . ABDOMINAL HYSTERECTOMY  2003  . CARDIOVASCULAR STRESS TEST  07/25/2007   Negative stress echo; ef 55-60%  . ESOPHAGEAL MANOMETRY  03/07/2012   Procedure: ESOPHAGEAL MANOMETRY (EM);  Surgeon: Mardella Layman, MD;  Location: [facility];  Service: Endoscopy;  Laterality: N/A;  . ESOPHAGEAL MANOMETRY N/A 06/08/2019   Procedure: ESOPHAGEAL MANOMETRY (EM);  Surgeon: Iva Boop, MD;  Location: [facility];  Service: Endoscopy;  Laterality: N/A;  . ESOPHAGOGASTRODUODENOSCOPY    . ESOPHAGOGASTRODUODENOSCOPY    . ESOPHAGOGASTRODUODENOSCOPY (EGD) WITH PROPOFOL N/A 06/08/2019   Procedure: ESOPHAGOGASTRODUODENOSCOPY (EGD) WITH PROPOFOL;  Surgeon: Iva Boop, MD;  Location: [facility];  Service: Endoscopy;  Laterality: N/A;  . KNEE ARTHROSCOPY  01/2011   left  . KNEE SURGERY      Current Medications: Current Meds  Medication Sig  . acetaminophen (TYLENOL) 325 MG tablet Take 650 mg by mouth every 6 (six) hours as needed (for pain.).  Marland Kitchen Cholecalciferol 50 MCG (2000 UT) TABS Take 1 tablet (2,000 Units total) by mouth daily.  . famotidine (PEPCID) 40 MG tablet Take 1 tablet (40 mg total) by  mouth at bedtime.  . methocarbamol (ROBAXIN) 750 MG tablet TAKE 1 TABLET(750 MG) BY MOUTH EVERY 8 HOURS AS NEEDED FOR MUSCLE SPASMS  . metoprolol tartrate (LOPRESSOR) 25 MG tablet Take 1 tablet (25 mg total) by mouth 2 (two) times daily.  . montelukast (SINGULAIR) 10 MG tablet Take 10 mg by mouth at bedtime as needed.  Marland Kitchen omeprazole (PRILOSEC) 40 MG capsule Take 1 capsule (40 mg total) by mouth daily at 2 am. (0400)  . spironolactone (ALDACTONE) 25 MG tablet Take 1 tablet (25 mg total) by mouth daily.  . traMADol (ULTRAM) 50 MG tablet TAKE 1 TABLET(50 MG) BY MOUTH EVERY 6 HOURS AS NEEDED  . [DISCONTINUED] indapamide (LOZOL) 2.5 MG tablet TAKE 1 TABLET(2.5 MG) BY MOUTH DAILY  . [DISCONTINUED] potassium chloride SA (KLOR-CON) 20 MEQ tablet Take 1 tablet (20 mEq  total) by mouth daily.     Allergies:   Oxycodone and Pneumococcal vaccines   Social History   Socioeconomic History  . Marital status: Married    Spouse name: Not on file  . Number of children: 0  . Years of education: Not on file  . Highest education level: Not on file  Occupational History  . Occupation: Scientist, research (medical): [employer]  Tobacco Use  . Smoking status: Never Smoker  . Smokeless tobacco: Never Used  Vaping Use  . Vaping Use: Never used  Substance and Sexual Activity  . Alcohol use: No  . Drug use: No  . Sexual activity: Not Currently    Birth control/protection: Surgical  Other Topics Concern  . 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 There is no history of Heart disease, Cancer, Alcohol abuse, Early death, Hearing loss, Hyperlipidemia, Kidney disease, or Stroke.  ROS:   Please see the history of present illness.     All other systems reviewed and are negative.  EKGs/Labs/Other Studies Reviewed:    The following studies were reviewed today:   EKG:     Recent Labs: 12/27/2019: ALT 9; Hemoglobin 12.9; Platelets 302 01/01/2020: TSH 0.95 05/02/2020: BUN 12; Creatinine, Ser 0.78; Potassium 3.8; Sodium 137  Recent Lipid Panel    Component Value Date/Time   CHOL 206 (H) 01/01/2020 1515   TRIG 123.0 01/01/2020 1515   HDL 57.70 01/01/2020 1515   CHOLHDL 4 01/01/2020 1515   VLDL 24.6 01/01/2020 1515   LDLCALC 124 (H) 01/01/2020 1515    Physical Exam:     Physical Exam: Blood pressure 140/80,  pulse 68, height 5\' 5"  (1.651 m), weight 267 lb 3.2 oz (121.2 kg), SpO2 97 %.  GEN:   Middle age , morbidly obese female .  HEENT: Normal NECK: No JVD; No carotid bruits LYMPHATICS: No lymphadenopathy CARDIAC: RRR , no murmurs, rubs, gallops RESPIRATORY:  Clear to auscultation without rales, wheezing or rhonchi  ABDOMEN: Soft, non-tender, non-distended MUSCULOSKELETAL:  No edema; No deformity  SKIN: Warm and dry NEUROLOGIC:  Alert and oriented x 3   ASSESSMENT:    1.  Essential hypertension, benign    PLAN:      1.   Palpitations:   No further palpitatins    3.  Hypertension:  BP is ok Will see if she does better with spironolactone instead of indapamide / kdur  DC indapamide  DC kdur  Start spironolactone 25 mg a day  BMP in 2-3 weeks  She will follow her BP readings.   .   Medication Adjustments/Labs and Tests Ordered: Current medicines are reviewed at length with the patient today.  Concerns regarding medicines are outlined above.  Orders Placed This Encounter  Procedures  . Basic metabolic panel   Meds ordered this encounter  Medications  . spironolactone (ALDACTONE) 25 MG tablet    Sig: Take 1 tablet (25 mg total) by mouth daily.    Dispense:  90 tablet    Refill:  3    Signed, Kristeen Miss, MD  07/01/2020 4:45 PM    [facility]

## 2020-07-16 ENCOUNTER — Other Ambulatory Visit: Payer: Self-pay

## 2020-07-16 ENCOUNTER — Other Ambulatory Visit: Payer: PRIVATE HEALTH INSURANCE | Admitting: *Deleted

## 2020-07-16 DIAGNOSIS — I10 Essential (primary) hypertension: Secondary | ICD-10-CM

## 2020-07-17 LAB — BASIC METABOLIC PANEL
BUN/Creatinine Ratio: 15 (ref 9–23)
BUN: 9 mg/dL (ref 6–24)
CO2: 23 mmol/L (ref 20–29)
Calcium: 9.2 mg/dL (ref 8.7–10.2)
Chloride: 105 mmol/L (ref 96–106)
Creatinine, Ser: 0.62 mg/dL (ref 0.57–1.00)
Glucose: 84 mg/dL (ref 65–99)
Potassium: 4 mmol/L (ref 3.5–5.2)
Sodium: 142 mmol/L (ref 134–144)
eGFR: 104 mL/min/{1.73_m2} (ref >59)

## 2020-09-12 ENCOUNTER — Other Ambulatory Visit: Payer: Self-pay | Admitting: Internal Medicine

## 2020-09-12 DIAGNOSIS — K2100 Gastro-esophageal reflux disease with esophagitis, without bleeding: Secondary | ICD-10-CM

## 2020-12-14 ENCOUNTER — Other Ambulatory Visit: Payer: Self-pay | Admitting: Internal Medicine

## 2021-01-12 NOTE — Progress Notes (Signed)
Subjective:    Patient ID: [name], female    DOB: 04-24-1965, 55 y.o.   MRN: [id number]  This visit occurred during the SARS-CoV-2 public health emergency.  Safety protocols were in place, including screening questions prior to the visit, additional usage of staff PPE, and extensive cleaning of exam room while observing appropriate contact time as indicated for disinfecting solutions.    HPI The patient is here for an acute visit.   Severe upper thigh pain x 2 weeks - she has anterior upper leg and sometimes lower leg pain.  It is worse with sleep and sitting.  The pain is better with standing and walking, but still there.  Sleeping is the worse.     No N/T.  No weakness in the leg.  She denies lower back pain.  No other muscle issues.  No muscle pain.   Ibuprofen has helped.  Muscle relaxers may help slightly.      She walks 1 mile several times a week.  No new activities or injuries.     Medications and allergies reviewed with patient and updated if appropriate.  Patient Active Problem List   Diagnosis Date Noted   Leg cramps 04/09/2020   Dysphagia    Esophageal diverticulum    Migraine 08/18/2017   Vitamin D deficiency 04/27/2017   HLD (hyperlipidemia) 06/27/2016   Gastroesophageal reflux disease with esophagitis 12/20/2014   Fatty liver disease, nonalcoholic 12/11/2014   Obstructive sleep apnea 09/28/2013   Morbid obesity with BMI of 50.0-59.9, adult (HCC) 06/28/2013   Esophageal dysmotility 06/28/2013   Prediabetes 06/28/2013   Low back pain radiating to both legs 06/28/2013   Essential hypertension, benign 11/04/2011   Routine general medical examination at a health care facility 11/04/2011   Palpitations 05/12/2011    Current Outpatient Medications on File Prior to Visit  Medication Sig Dispense Refill   acetaminophen (TYLENOL) 325 MG tablet Take 650 mg by mouth every 6 (six) hours as needed (for pain.).     Cholecalciferol 50 MCG (2000 UT) TABS Take  1 tablet (2,000 Units total) by mouth daily. 90 tablet 3   famotidine (PEPCID) 40 MG tablet TAKE 1 TABLET(40 MG) BY MOUTH AT BEDTIME 90 tablet 3   methocarbamol (ROBAXIN) 750 MG tablet TAKE 1 TABLET(750 MG) BY MOUTH EVERY 8 HOURS AS NEEDED FOR MUSCLE SPASMS 65 tablet 2   metoprolol tartrate (LOPRESSOR) 25 MG tablet Take 1 tablet (25 mg total) by mouth 2 (two) times daily. 180 tablet 3   montelukast (SINGULAIR) 10 MG tablet TAKE 1 TABLET(10 MG) BY MOUTH AT BEDTIME 90 tablet 0   omeprazole (PRILOSEC) 40 MG capsule TAKE 1 CAPSULE BY MOUTH EVERY DAY AT 2 AM 90 capsule 1   spironolactone (ALDACTONE) 25 MG tablet Take 1 tablet (25 mg total) by mouth daily. 90 tablet 3   traMADol (ULTRAM) 50 MG tablet TAKE 1 TABLET(50 MG) BY MOUTH EVERY 6 HOURS AS NEEDED 65 tablet 1   No current facility-administered medications on file prior to visit.    Past Medical History:  Diagnosis Date   Arthritis    COVID-19 06/2018   Esophageal stenosis    Esophagitis    GERD (gastroesophageal reflux disease)    Hiatal hernia    Hypertension    Iron deficiency anemia    Migraines    Morbid obesity (HCC)    Sleep apnea         Past Surgical History:  Procedure Laterality Date   ABDOMINAL HYSTERECTOMY  ABDOMINAL HYSTERECTOMY  2003   CARDIOVASCULAR STRESS TEST  07/25/2007   Negative stress echo; ef 55-60%   ESOPHAGEAL MANOMETRY  03/07/2012   Procedure: ESOPHAGEAL MANOMETRY (EM);  Surgeon: Mardella Layman, MD;  Location: [facility];  Service: Endoscopy;  Laterality: N/A;   ESOPHAGEAL MANOMETRY N/A 06/08/2019   Procedure: ESOPHAGEAL MANOMETRY (EM);  Surgeon: Iva Boop, MD;  Location: [facility];  Service: Endoscopy;  Laterality: N/A;   ESOPHAGOGASTRODUODENOSCOPY     ESOPHAGOGASTRODUODENOSCOPY     ESOPHAGOGASTRODUODENOSCOPY (EGD) WITH PROPOFOL N/A 06/08/2019   Procedure: ESOPHAGOGASTRODUODENOSCOPY (EGD) WITH PROPOFOL;  Surgeon: Iva Boop, MD;  Location: [facility];  Service: Endoscopy;   Laterality: N/A;   KNEE ARTHROSCOPY  01/2011   left   KNEE SURGERY      Social History   Socioeconomic History   Marital status: Married    Spouse name: Not on file   Number of children: 0   Years of education: Not on file   Highest education level: Not on file  Occupational History   Occupation: CNA    Employer: [employer]  Tobacco Use   Smoking status: Never   Smokeless tobacco: Never  Vaping Use   Vaping Use: Never used  Substance and Sexual Activity   Alcohol use: No   Drug use: No   Sexual activity: Not Currently    Birth control/protection: Surgical  Other Topics Concern   Not on file  Social History Narrative   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[employer]   No children   Never smoker, no EtOH/drugs   Social Determinants of Corporate investment banker Strain: Not on file  Food Insecurity: Not on file  Transportation Needs: Not on file  Physical Activity: Not on file  Stress: Not on file  Social Connections: Not on file    Family History  Problem Relation Age of Onset   Cervical cancer Mother    Hypertension Sister    Diabetes Sister    Heart disease Neg Hx    Cancer Neg Hx    Alcohol abuse Neg Hx    Early death Neg Hx    Hearing loss Neg Hx    Hyperlipidemia Neg Hx    Kidney disease Neg Hx    Stroke Neg Hx     Review of Systems     Objective:   Vitals:   01/13/21 0747  BP: (!) 142/90  Pulse: 65  Temp: 98.3 F (36.8 C)  SpO2: 99%   BP Readings from Last 3 Encounters:  01/13/21 (!) 142/90  07/01/20 140/80  04/09/20 140/86   Wt Readings from Last 3 Encounters:  01/13/21 270 lb (122.5 kg)  07/01/20 267 lb 3.2 oz (121.2 kg)  04/09/20 269 lb (122 kg)   Body mass index is 44.93 kg/m.   Physical Exam Constitutional:      General: She is not in acute distress.    Appearance: Normal appearance. She is not ill-appearing.  HENT:     Head: Normocephalic and atraumatic.  Musculoskeletal:     Right lower leg: No edema.     Left lower  leg: No edema.     Comments: No lumbar spine or lower back tenderness.  No leg muscle tenderness with palpation.    Skin:    General: Skin is warm and dry.  Neurological:     Mental Status: She is alert.     Sensory: No sensory deficit.     Motor: No weakness.  Gait: Gait normal.           Assessment & Plan:    B/L leg pain: Acute Started 2 weeks ago w/o cause  No lower back pain, N/T or leg weakness, no muscle tenderness Worse with laying down, then sitting - still present with walking but better ? Spinal stenosis, radiculopathy Xray today -- may need MRI Prednisone 50 mg daily x 5 days - take with food - no nsaids while taking this Gabapentin 100-200 mg HS   If no improvement or symptoms return will need to see specialist or further imaging

## 2021-01-13 ENCOUNTER — Other Ambulatory Visit: Payer: Self-pay

## 2021-01-13 ENCOUNTER — Ambulatory Visit (INDEPENDENT_AMBULATORY_CARE_PROVIDER_SITE_OTHER): Payer: No Typology Code available for payment source

## 2021-01-13 ENCOUNTER — Encounter: Payer: Self-pay | Admitting: Internal Medicine

## 2021-01-13 ENCOUNTER — Ambulatory Visit: Payer: No Typology Code available for payment source | Admitting: Internal Medicine

## 2021-01-13 VITALS — BP 142/90 | HR 65 | Temp 98.3°F | Ht 65.0 in | Wt 270.0 lb

## 2021-01-13 DIAGNOSIS — M79604 Pain in right leg: Secondary | ICD-10-CM

## 2021-01-13 DIAGNOSIS — M79605 Pain in left leg: Secondary | ICD-10-CM | POA: Diagnosis not present

## 2021-01-13 MED ORDER — GABAPENTIN 100 MG PO CAPS
100.0000 mg | ORAL_CAPSULE | Freq: Every day | ORAL | 1 refills | Status: DC
Start: 2021-01-13 — End: 2022-08-28

## 2021-01-13 MED ORDER — PREDNISONE 50 MG PO TABS: 50.0000 mg | ORAL_TABLET | Freq: Every day | ORAL | 0 refills | Status: DC

## 2021-01-13 NOTE — Patient Instructions (Addendum)
    Have an xray downstairs.     Medications changes include :   prednisone 50 mg daily x 5 days, gabapentin 100-200 mg at night  Your prescription(s) have been submitted to your pharmacy. Please take as directed and contact our office if you believe you are having problem(s) with the medication(s).

## 2021-03-12 ENCOUNTER — Other Ambulatory Visit: Payer: Self-pay | Admitting: Internal Medicine

## 2021-03-12 DIAGNOSIS — M545 Low back pain, unspecified: Secondary | ICD-10-CM

## 2021-03-12 DIAGNOSIS — M79604 Pain in right leg: Secondary | ICD-10-CM

## 2021-04-09 ENCOUNTER — Other Ambulatory Visit: Payer: Self-pay | Admitting: *Deleted

## 2021-04-09 MED ORDER — METOPROLOL TARTRATE 25 MG PO TABS: 25.0000 mg | ORAL_TABLET | Freq: Two times a day (BID) | ORAL | 0 refills | Status: DC

## 2021-05-05 ENCOUNTER — Other Ambulatory Visit: Payer: Self-pay | Admitting: Internal Medicine

## 2021-05-05 DIAGNOSIS — K2100 Gastro-esophageal reflux disease with esophagitis, without bleeding: Secondary | ICD-10-CM

## 2021-05-22 LAB — HM MAMMOGRAPHY: HM Mammogram: NORMAL (ref 0–4)

## 2021-06-02 LAB — HM MAMMOGRAPHY

## 2021-07-14 ENCOUNTER — Other Ambulatory Visit: Payer: Self-pay | Admitting: Cardiovascular Disease

## 2021-08-01 ENCOUNTER — Ambulatory Visit: Payer: No Typology Code available for payment source | Admitting: Internal Medicine

## 2021-08-01 ENCOUNTER — Encounter: Payer: Self-pay | Admitting: Internal Medicine

## 2021-08-01 ENCOUNTER — Ambulatory Visit (INDEPENDENT_AMBULATORY_CARE_PROVIDER_SITE_OTHER): Payer: No Typology Code available for payment source

## 2021-08-01 ENCOUNTER — Ambulatory Visit (HOSPITAL_COMMUNITY)
Admission: RE | Admit: 2021-08-01 | Discharge: 2021-08-01 | Disposition: A | Discharge status: Home / Self Care | Payer: No Typology Code available for payment source | Source: Ambulatory Visit | Attending: Internal Medicine | Admitting: Internal Medicine

## 2021-08-01 VITALS — BP 140/86 | HR 72 | Temp 98.4°F | Ht 65.0 in | Wt 269.0 lb

## 2021-08-01 DIAGNOSIS — M25561 Pain in right knee: Secondary | ICD-10-CM

## 2021-08-01 DIAGNOSIS — G8929 Other chronic pain: Secondary | ICD-10-CM

## 2021-08-01 DIAGNOSIS — M79604 Pain in right leg: Secondary | ICD-10-CM | POA: Insufficient documentation

## 2021-08-01 MED ORDER — KETOROLAC TROMETHAMINE 60 MG/2ML IM SOLN
60.0000 mg | Freq: Once | INTRAMUSCULAR | Status: AC
  Administered 2021-08-01: 60 mg via INTRAMUSCULAR

## 2021-08-01 MED ORDER — TRAMADOL HCL 50 MG PO TABS: 50.0000 mg | ORAL_TABLET | Freq: Three times a day (TID) | ORAL | 0 refills | Status: AC | PRN

## 2021-08-01 NOTE — Patient Instructions (Addendum)
    Have an xray down stairs.   Make an appointment with Sports medicine for your right knee pain and right lower leg pain.    You had a toradol injection today.    Medications changes include :   Tramadol 1-2 tabs every 8 hours as needed for severe pain.   Your prescription(s) have been sent to your pharmacy.    A referral was ordered for sport medicine.     Someone from that office will call you to schedule an appointment.    Return if symptoms worsen or fail to improve.

## 2021-08-01 NOTE — Progress Notes (Signed)
Subjective:    Patient ID: [name], female    DOB: 1965-07-29, 56 y.o.   MRN: [id number]      HPI [name] is here for  Chief Complaint  Patient presents with   Leg Pain    Right leg pain     Her right leg hurts.  This started about one week ago.  She has difficulty sleeping.  It starts in the lower posterior leg in her distal calf and moves up to her posterior knee.  The pain is sharp sometimes and dull other times.  She is not sure if it is nerve pain or related to her right knee OA.  The pain is constant.  She denies any injuries or new activities.  Her right knee was hot for a couple of days.  Her lower legs were a little swollen - feel a little tight.  No N/T.  The leg feels weak.  No back pain.  Has muscle spasms in both legs.  Sitting may make it worse.    Gabapentin 200 mg did not help, tylenol 1000 mg, methocarbamol 750 mg and ibuprofen 600 mg did not help.    Medications and allergies reviewed with patient and updated if appropriate.  Current Outpatient Medications on File Prior to Visit  Medication Sig Dispense Refill   acetaminophen (TYLENOL) 325 MG tablet Take 650 mg by mouth every 6 (six) hours as needed (for pain.).     famotidine (PEPCID) 40 MG tablet TAKE 1 TABLET(40 MG) BY MOUTH AT BEDTIME 90 tablet 3   gabapentin (NEURONTIN) 100 MG capsule Take 1-2 capsules (100-200 mg total) by mouth at bedtime. 60 capsule 1   methocarbamol (ROBAXIN) 750 MG tablet TAKE 1 TABLET(750 MG) BY MOUTH EVERY 8 HOURS AS NEEDED FOR MUSCLE SPASMS 65 tablet 2   metoprolol tartrate (LOPRESSOR) 25 MG tablet TAKE 1 TABLET(25 MG) BY MOUTH TWICE DAILY 60 tablet 0   omeprazole (PRILOSEC) 40 MG capsule TAKE 1 CAPSULE BY MOUTH EVERY DAY AT 2 AM 90 capsule 1   spironolactone (ALDACTONE) 25 MG tablet Take 1 tablet (25 mg total) by mouth daily. 90 tablet 3   sucralfate (CARAFATE) 1 GM/10ML suspension 10 mL orally QIDACHS for 30 day(s)     No current facility-administered medications on  file prior to visit.    Review of Systems     Objective:   Vitals:   08/01/21 0934  BP: 140/86  Pulse: 72  Temp: 98.4 F (36.9 C)  SpO2: 98%   BP Readings from Last 3 Encounters:  08/01/21 140/86  01/13/21 (!) 142/90  07/01/20 140/80   Wt Readings from Last 3 Encounters:  08/01/21 269 lb (122 kg)  01/13/21 270 lb (122.5 kg)  07/01/20 267 lb 3.2 oz (121.2 kg)   Body mass index is 44.76 kg/m.    Physical Exam Constitutional:      General: She is not in acute distress.    Appearance: Normal appearance.  HENT:     Head: Normocephalic.  Musculoskeletal:     Right lower leg: Edema (Trace) present.     Left lower leg: Edema (Trace) present.     Comments: Mild tenderness with palpation of right calf, positive Homans' sign..  No pain in right knee with palpation.  Good range of motion of knee.  No lower back pain  Skin:    General: Skin is warm and dry.  Neurological:     Mental Status: She is alert.  Assessment & Plan:     Acute pain right lower leg, right knee pain Acute Pain started 1 week ago and is from the right posterior lower leg near the Achilles to the posterior knee.  She also has chronic right knee pain No injuries or new activities Possible some mild swelling Differential causes-related to knee osteoarthritis, pain related to Baker's cyst, lumbar radiculopathy, less likely DVT We will get ultrasound of leg to rule out DVT/Baker's cyst X-ray of right knee Referral to sports medicine Toradol 60 mg IM x1 Pain is significant and affecting her ability to sleep She has tried several things without improvement in the pain I will prescribe tramadol 50-100 mg every 8 hours as needed-short-term only

## 2021-08-05 ENCOUNTER — Ambulatory Visit: Payer: No Typology Code available for payment source | Admitting: Family Medicine

## 2021-08-05 ENCOUNTER — Encounter: Payer: Self-pay | Admitting: Family Medicine

## 2021-08-05 VITALS — BP 106/74 | HR 69 | Ht 65.0 in | Wt 269.2 lb

## 2021-08-05 DIAGNOSIS — M25561 Pain in right knee: Secondary | ICD-10-CM

## 2021-08-05 DIAGNOSIS — M5416 Radiculopathy, lumbar region: Secondary | ICD-10-CM | POA: Diagnosis not present

## 2021-08-05 DIAGNOSIS — M79661 Pain in right lower leg: Secondary | ICD-10-CM | POA: Diagnosis not present

## 2021-08-05 NOTE — Progress Notes (Signed)
Subjective:    CC: R knee and lower leg pain  I, Molly Weber, LAT, ATC, am serving as scribe for Dr. Clementeen Graham.  HPI: Pt is a 56 y/o female presenting w/ c/o R post knee and calf pain x approximately 2 weeks w/ no known MOI.  She locates her pain to her R ant  knee and R lateral lower leg.  She was seen by [facility] for these c/o on 08/01/21 and was referred for a R LE venous doppler US that she had on 08/01/21 that was negative for DVT or Baker's cyst.  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  Back pain: yes, low back pain Knee Swelling: no Aggravating factors: at night when she goes to bed; prolonged walking/weight-bearing activity; driving as it pertains to her lower leg Treatments tried: Toradol injection; Gabapentin; Advil; Tylenol; IcyHot  Diagnostic testing: R LE venous doppler US- 08/01/21; R knee XR- 08/01/21; L-spine XR- 01/13/21  Pertinent review of Systems: No fevers or chills  Relevant historical information: Hypertension   Objective:    Vitals:   08/05/21 0857  BP: 106/74  Pulse: 69  SpO2: 95%   General: Well Developed, well nourished, and in no acute distress.   MSK: Nontender midline.  Normal lumbar motion. Lower extremity strength is intact. Normal gait  Right knee: Normal appearing Tender palpation mildly medial joint line. Normal knee motion.    Lab and Radiology Results  EXAM: LUMBAR SPINE - COMPLETE 4+ VIEW   COMPARISON:  06/30/2013   FINDINGS: No recent fracture is seen. There is 7 mm anterolisthesis at L4-L5 level. There is disc space narrowing at L4-L5 level. Small anterior bony spurs seen. Degenerative changes are noted in facet joints, particularly prominent from L3-S1 levels.   IMPRESSION: No recent fracture is seen in the lumbar spine. There is first-degree anterolisthesis at L4-L5 level. Degenerative changes are noted with disc space narrowing, bony spurs and facet hypertrophy, more so at L4-L5 and  L5-S1 levels.     Electronically Signed   By: Ernie Avena M.D.   On: 01/13/2021 15:07    EXAM: RIGHT KNEE - COMPLETE 4+ VIEW   COMPARISON:  None Available.   FINDINGS: No acute fracture or dislocation identified. Moderate to severe narrowing of the medial compartment and patellofemoral joint. Small tricompartmental osteophytes. Bones appear osteopenic. No significant joint effusion visualized.   IMPRESSION: Degenerative changes with no acute osseous abnormality identified.     Electronically Signed   By: Jannifer Hick M.D.   On: 08/01/2021 15:35   I, Clementeen Graham, personally (independently) visualized and performed the interpretation of the images attached in this note.    Impression and Recommendations:    Assessment and Plan: 56 y.o. female with right knee pain thought to be due to exacerbation of DJD.  We discussed options.  She would like to avoid injection for now which is reasonable.  We will proceed with conservative management trial of Voltaren gel and Tylenol arthritis.  Also refer to physical therapy and focus on weight loss.  Recheck in 6 weeks.  If not improved consider steroid injection at that point.  Right lateral lower leg pain thought to be lumbar radiculopathy at L5.  Currently treated with low-dose gabapentin.  Discussed modifying the timing of gabapentin to better coincide with bedtime which will be more tolerable.  Backup plan for prednisone.  Can prescribe this at any time.  Again physical therapy should be  helpful here.  Recheck in 6 weeks just prior to a planned vacation to the beach in late July.Marland Kitchen  PDMP not reviewed this encounter. Orders Placed This Encounter  Procedures   Ambulatory referral to Physical Therapy    Referral Priority:   Routine    Referral Type:   Physical Medicine    Referral Reason:   Specialty Services Required    Requested Specialty:   Physical Therapy    Number of Visits Requested:   1   No orders of the  defined types were placed in this encounter.   Discussed warning signs or symptoms. Please see discharge instructions. Patient expresses understanding.   The above documentation has been reviewed and is accurate and complete Lynne Leader, M.D.

## 2021-08-05 NOTE — Patient Instructions (Addendum)
Nice to meet you today.   I've referred you to [facility].  Their office will call you to schedule but please let us know if you don't hear from them in one week regarding scheduling.  Take Gabapentin.  Tylenol Arthritis  Please use Voltaren gel (Generic Diclofenac Gel) up to 4x daily for pain as needed.  This is available over-the-counter as both the name brand Voltaren gel and the generic diclofenac gel.   Follow-up: late July

## 2021-08-15 NOTE — Therapy (Incomplete)
OUTPATIENT PHYSICAL THERAPY LOWER EXTREMITY EVALUATION   Patient Name: [name] MRN: [id number] DOB:1965-07-31, 56 y.o., female Today's Date: 08/15/2021    Past Medical History:  Diagnosis Date   Arthritis    COVID-19 06/2018   Esophageal stenosis    Esophagitis    GERD (gastroesophageal reflux disease)    Hiatal hernia    Hypertension    Iron deficiency anemia    Migraines    Morbid obesity (HCC)    Sleep apnea        Past Surgical History:  Procedure Laterality Date   ABDOMINAL HYSTERECTOMY     ABDOMINAL HYSTERECTOMY  2003   CARDIOVASCULAR STRESS TEST  07/25/2007   Negative stress echo; ef 55-60%   ESOPHAGEAL MANOMETRY  03/07/2012   Procedure: ESOPHAGEAL MANOMETRY (EM);  Surgeon: Mardella Layman, MD;  Location: [facility];  Service: Endoscopy;  Laterality: N/A;   ESOPHAGEAL MANOMETRY N/A 06/08/2019   Procedure: ESOPHAGEAL MANOMETRY (EM);  Surgeon: Iva Boop, MD;  Location: [facility];  Service: Endoscopy;  Laterality: N/A;   ESOPHAGOGASTRODUODENOSCOPY     ESOPHAGOGASTRODUODENOSCOPY     ESOPHAGOGASTRODUODENOSCOPY (EGD) WITH PROPOFOL N/A 06/08/2019   Procedure: ESOPHAGOGASTRODUODENOSCOPY (EGD) WITH PROPOFOL;  Surgeon: Iva Boop, MD;  Location: [facility];  Service: Endoscopy;  Laterality: N/A;   KNEE ARTHROSCOPY  01/2011   left   KNEE SURGERY     Patient Active Problem List   Diagnosis Date Noted   Leg cramps 04/09/2020   Dysphagia    Esophageal diverticulum    Migraine 08/18/2017   Vitamin D deficiency 04/27/2017   HLD (hyperlipidemia) 06/27/2016   Gastroesophageal reflux disease with esophagitis 12/20/2014   Fatty liver disease, nonalcoholic 12/11/2014   Obstructive sleep apnea 09/28/2013   Morbid obesity with BMI of 50.0-59.9, adult (HCC) 06/28/2013   Esophageal dysmotility 06/28/2013   Prediabetes 06/28/2013   Low back pain radiating to both legs 06/28/2013   Essential hypertension, benign 11/04/2011   Routine general medical  examination at a health care facility 11/04/2011   Palpitations 05/12/2011    PCP: Sanda Linger  REFERRING PROVIDER: Sanda Linger  REFERRING DIAG: M25.561, M79.661, M54.16  THERAPY DIAG:  No diagnosis found.  Rationale for Evaluation and Treatment {HABREHAB:27488}  ONSET DATE: ***  SUBJECTIVE:   SUBJECTIVE STATEMENT: ***  PERTINENT HISTORY: ***  PAIN:  Are you having pain? {OPRCPAIN:27236}  PRECAUTIONS: {Therapy precautions:24002}  WEIGHT BEARING RESTRICTIONS {Yes ***/No:24003}  FALLS:  Has patient fallen in last 6 months? {fallsyesno:27318}  LIVING ENVIRONMENT: Lives with: {[facility] lives with:25569::"lives with their family"} Lives in: {Lives in:25570} Stairs: {opstairs:27293} Has following equipment at home: {Assistive devices:23999}  OCCUPATION: ***  PLOF: {PLOF:24004}  PATIENT GOALS ***   OBJECTIVE:   DIAGNOSTIC FINDINGS: EXAM: LUMBAR SPINE - COMPLETE 4+ VIEW   COMPARISON:  06/30/2013   FINDINGS: No recent fracture is seen. There is 7 mm anterolisthesis at L4-L5 level. There is disc space narrowing at L4-L5 level. Small anterior bony spurs seen. Degenerative changes are noted in facet joints, particularly prominent from L3-S1 levels.   IMPRESSION: No recent fracture is seen in the lumbar spine. There is first-degree anterolisthesis at L4-L5 level. Degenerative changes are noted with disc space narrowing, bony spurs and facet hypertrophy, more so at L4-L5 and L5-S1 levels.   EXAM: RIGHT KNEE - COMPLETE 4+ VIEW   COMPARISON:  None Available.   FINDINGS: No acute fracture or dislocation identified. Moderate to severe narrowing of the medial compartment and patellofemoral joint. Small tricompartmental osteophytes. Bones appear osteopenic. No significant  joint effusion visualized.   IMPRESSION: Degenerative changes with no acute osseous abnormality identified.  PATIENT SURVEYS:  {rehab surveys:24030}  COGNITION:  Overall cognitive  status: {cognition:24006}     SENSATION: {sensation:27233}  EDEMA:  {edema:24020}  MUSCLE LENGTH: Hamstrings: Right *** deg; Left *** deg [name] test: Right *** deg; Left *** deg  POSTURE: {posture:25561}  PALPATION: ***   LUMBAR ROM:   LOWER EXTREMITY ROM:  {AROM/PROM:27142} ROM Right eval Left eval  Hip flexion    Hip extension    Hip abduction    Hip adduction    Hip internal rotation    Hip external rotation    Knee flexion    Knee extension    Ankle dorsiflexion    Ankle plantarflexion    Ankle inversion    Ankle eversion     (Blank rows = not tested)  LOWER EXTREMITY MMT:  MMT Right eval Left eval  Hip flexion    Hip extension    Hip abduction    Hip adduction    Hip internal rotation    Hip external rotation    Knee flexion    Knee extension    Ankle dorsiflexion    Ankle plantarflexion    Ankle inversion    Ankle eversion     (Blank rows = not tested)  LOWER EXTREMITY SPECIAL TESTS:  {LEspecialtests:26242}  FUNCTIONAL TESTS:  {Functional tests:24029}  GAIT: Distance walked: *** Assistive device utilized: {Assistive devices:23999} Level of assistance: {Levels of assistance:24026} Comments: ***    TODAY'S TREATMENT: ***   PATIENT EDUCATION:  Education details: *** Person educated: {Person educated:25204} Education method: {Education Method:25205} Education comprehension: {Education Comprehension:25206}   HOME EXERCISE PROGRAM: ***  ASSESSMENT:  CLINICAL IMPRESSION: Patient is a *** y.o. *** who was seen today for physical therapy evaluation and treatment for ***.    OBJECTIVE IMPAIRMENTS {opptimpairments:25111}.   ACTIVITY LIMITATIONS {activitylimitations:27494}  PARTICIPATION LIMITATIONS: {participationrestrictions:25113}  PERSONAL FACTORS {Personal factors:25162} are also affecting patient's functional outcome.   REHAB POTENTIAL: {rehabpotential:25112}  CLINICAL DECISION MAKING: {clinical decision  making:25114}  EVALUATION COMPLEXITY: {Evaluation complexity:25115}   GOALS: Goals reviewed with patient? {yes/no:20286}  SHORT TERM GOALS: Target date: {follow up:25551}  *** Baseline: Goal status: {GOALSTATUS:25110}  2.  *** Baseline:  Goal status: {GOALSTATUS:25110}  3.  *** Baseline:  Goal status: {GOALSTATUS:25110}  4.  *** Baseline:  Goal status: {GOALSTATUS:25110}  5.  *** Baseline:  Goal status: {GOALSTATUS:25110}  6.  *** Baseline:  Goal status: {GOALSTATUS:25110}  LONG TERM GOALS: Target date: {follow up:25551}   *** Baseline:  Goal status: {GOALSTATUS:25110}  2.  *** Baseline:  Goal status: {GOALSTATUS:25110}  3.  *** Baseline:  Goal status: {GOALSTATUS:25110}  4.  *** Baseline:  Goal status: {GOALSTATUS:25110}  5.  *** Baseline:  Goal status: {GOALSTATUS:25110}  6.  *** Baseline:  Goal status: {GOALSTATUS:25110}   PLAN: PT FREQUENCY: {rehab frequency:25116}  PT DURATION: {rehab duration:25117}  PLANNED INTERVENTIONS: {rehab planned interventions:25118::"Therapeutic exercises","Therapeutic activity","Neuromuscular re-education","Balance training","Gait training","Patient/Family education","Joint mobilization"}  PLAN FOR NEXT SESSION: ***   Cassie Freer, PT 08/15/2021, 10:07 AM

## 2021-08-18 ENCOUNTER — Ambulatory Visit: Payer: No Typology Code available for payment source

## 2021-08-26 NOTE — Therapy (Signed)
OUTPATIENT PHYSICAL THERAPY LOWER EXTREMITY EVALUATION   Patient Name: [name] MRN: [id number] DOB:1966/01/30, 56 y.o., female Today's Date: 08/26/2021    Past Medical History:  Diagnosis Date   Arthritis    COVID-19 06/2018   Esophageal stenosis    Esophagitis    GERD (gastroesophageal reflux disease)    Hiatal hernia    Hypertension    Iron deficiency anemia    Migraines    Morbid obesity (HCC)    Sleep apnea        Past Surgical History:  Procedure Laterality Date   ABDOMINAL HYSTERECTOMY     ABDOMINAL HYSTERECTOMY  2003   CARDIOVASCULAR STRESS TEST  07/25/2007   Negative stress echo; ef 55-60%   ESOPHAGEAL MANOMETRY  03/07/2012   Procedure: ESOPHAGEAL MANOMETRY (EM);  Surgeon: Mardella Layman, MD;  Location: [facility];  Service: Endoscopy;  Laterality: N/A;   ESOPHAGEAL MANOMETRY N/A 06/08/2019   Procedure: ESOPHAGEAL MANOMETRY (EM);  Surgeon: Iva Boop, MD;  Location: [facility];  Service: Endoscopy;  Laterality: N/A;   ESOPHAGOGASTRODUODENOSCOPY     ESOPHAGOGASTRODUODENOSCOPY     ESOPHAGOGASTRODUODENOSCOPY (EGD) WITH PROPOFOL N/A 06/08/2019   Procedure: ESOPHAGOGASTRODUODENOSCOPY (EGD) WITH PROPOFOL;  Surgeon: Iva Boop, MD;  Location: [facility];  Service: Endoscopy;  Laterality: N/A;   KNEE ARTHROSCOPY  01/2011   left   KNEE SURGERY     Patient Active Problem List   Diagnosis Date Noted   Leg cramps 04/09/2020   Dysphagia    Esophageal diverticulum    Migraine 08/18/2017   Vitamin D deficiency 04/27/2017   HLD (hyperlipidemia) 06/27/2016   Gastroesophageal reflux disease with esophagitis 12/20/2014   Fatty liver disease, nonalcoholic 12/11/2014   Obstructive sleep apnea 09/28/2013   Morbid obesity with BMI of 50.0-59.9, adult (HCC) 06/28/2013   Esophageal dysmotility 06/28/2013   Prediabetes 06/28/2013   Low back pain radiating to both legs 06/28/2013   Essential hypertension, benign 11/04/2011   Routine general medical  examination at a health care facility 11/04/2011   Palpitations 05/12/2011    PCP: Sanda Linger  REFERRING PROVIDER: Sanda Linger  REFERRING DIAG: M25.561, M79.661, M54.16  THERAPY DIAG:  No diagnosis found.  Rationale for Evaluation and Treatment Rehabilitation  ONSET DATE: ***  SUBJECTIVE:   SUBJECTIVE STATEMENT: ***  PERTINENT HISTORY: ***  PAIN:  Are you having pain? {OPRCPAIN:27236}  PRECAUTIONS: {Therapy precautions:24002}  WEIGHT BEARING RESTRICTIONS {Yes ***/No:24003}  FALLS:  Has patient fallen in last 6 months? {fallsyesno:27318}  LIVING ENVIRONMENT: Lives with: {[facility] lives with:25569::"lives with their family"} Lives in: {Lives in:25570} Stairs: {opstairs:27293} Has following equipment at home: {Assistive devices:23999}  OCCUPATION: ***  PLOF: {PLOF:24004}  PATIENT GOALS ***   OBJECTIVE:   DIAGNOSTIC FINDINGS: EXAM: LUMBAR SPINE - COMPLETE 4+ VIEW   COMPARISON:  06/30/2013   FINDINGS: No recent fracture is seen. There is 7 mm anterolisthesis at L4-L5 level. There is disc space narrowing at L4-L5 level. Small anterior bony spurs seen. Degenerative changes are noted in facet joints, particularly prominent from L3-S1 levels.   IMPRESSION: No recent fracture is seen in the lumbar spine. There is first-degree anterolisthesis at L4-L5 level. Degenerative changes are noted with disc space narrowing, bony spurs and facet hypertrophy, more so at L4-L5 and L5-S1 levels.   EXAM: RIGHT KNEE - COMPLETE 4+ VIEW   COMPARISON:  None Available.   FINDINGS: No acute fracture or dislocation identified. Moderate to severe narrowing of the medial compartment and patellofemoral joint. Small tricompartmental osteophytes. Bones appear osteopenic. No significant  joint effusion visualized.   IMPRESSION: Degenerative changes with no acute osseous abnormality identified.  PATIENT SURVEYS:  {rehab surveys:24030}  COGNITION:  Overall cognitive status:  {cognition:24006}     SENSATION: {sensation:27233}  EDEMA:  {edema:24020}  MUSCLE LENGTH: Hamstrings: Right *** deg; Left *** deg [name] test: Right *** deg; Left *** deg  POSTURE: {posture:25561}  PALPATION: ***   LUMBAR ROM:   LOWER EXTREMITY ROM:  {AROM/PROM:27142} ROM Right eval Left eval  Hip flexion    Hip extension    Hip abduction    Hip adduction    Hip internal rotation    Hip external rotation    Knee flexion    Knee extension    Ankle dorsiflexion    Ankle plantarflexion    Ankle inversion    Ankle eversion     (Blank rows = not tested)  LOWER EXTREMITY MMT:  MMT Right eval Left eval  Hip flexion    Hip extension    Hip abduction    Hip adduction    Hip internal rotation    Hip external rotation    Knee flexion    Knee extension    Ankle dorsiflexion    Ankle plantarflexion    Ankle inversion    Ankle eversion     (Blank rows = not tested)  LOWER EXTREMITY SPECIAL TESTS:  Knee special tests: {KNEE SPECIAL TESTS:26240}  FUNCTIONAL TESTS:  5 times sit to stand: *** Timed up and go (TUG): ***  GAIT: Distance walked: *** Assistive device utilized: {Assistive devices:23999} Level of assistance: {Levels of assistance:24026} Comments: ***    TODAY'S TREATMENT: ***   PATIENT EDUCATION:  Education details: *** Person educated: {Person educated:25204} Education method: {Education Method:25205} Education comprehension: {Education Comprehension:25206}   HOME EXERCISE PROGRAM: ***  ASSESSMENT:  CLINICAL IMPRESSION: Patient is a *** y.o. *** who was seen today for physical therapy evaluation and treatment for ***.    OBJECTIVE IMPAIRMENTS {opptimpairments:25111}.   ACTIVITY LIMITATIONS {activitylimitations:27494}  PARTICIPATION LIMITATIONS: {participationrestrictions:25113}  PERSONAL FACTORS {Personal factors:25162} are also affecting patient's functional outcome.   REHAB POTENTIAL: {rehabpotential:25112}  CLINICAL  DECISION MAKING: {clinical decision making:25114}  EVALUATION COMPLEXITY: {Evaluation complexity:25115}   GOALS: Goals reviewed with patient? {yes/no:20286}  SHORT TERM GOALS: Target date: {follow up:25551}  *** Baseline: Goal status: {GOALSTATUS:25110}  2.  *** Baseline:  Goal status: {GOALSTATUS:25110}  3.  *** Baseline:  Goal status: {GOALSTATUS:25110}  4.  *** Baseline:  Goal status: {GOALSTATUS:25110}  5.  *** Baseline:  Goal status: {GOALSTATUS:25110}  6.  *** Baseline:  Goal status: {GOALSTATUS:25110}  LONG TERM GOALS: Target date: {follow up:25551}   *** Baseline:  Goal status: {GOALSTATUS:25110}  2.  *** Baseline:  Goal status: {GOALSTATUS:25110}  3.  *** Baseline:  Goal status: {GOALSTATUS:25110}  4.  *** Baseline:  Goal status: {GOALSTATUS:25110}  5.  *** Baseline:  Goal status: {GOALSTATUS:25110}  6.  *** Baseline:  Goal status: {GOALSTATUS:25110}   PLAN: PT FREQUENCY: {rehab frequency:25116}  PT DURATION: {rehab duration:25117}  PLANNED INTERVENTIONS: {rehab planned interventions:25118::"Therapeutic exercises","Therapeutic activity","Neuromuscular re-education","Balance training","Gait training","Patient/Family education","Joint mobilization"}  PLAN FOR NEXT SESSION: ***   Cassie Freer, PT 08/26/2021, 2:52 PM

## 2021-08-27 ENCOUNTER — Ambulatory Visit: Payer: No Typology Code available for payment source | Attending: Family Medicine

## 2021-08-27 DIAGNOSIS — M79661 Pain in right lower leg: Secondary | ICD-10-CM | POA: Insufficient documentation

## 2021-08-27 DIAGNOSIS — M5416 Radiculopathy, lumbar region: Secondary | ICD-10-CM | POA: Diagnosis not present

## 2021-08-27 DIAGNOSIS — M25561 Pain in right knee: Secondary | ICD-10-CM | POA: Diagnosis present

## 2021-08-27 DIAGNOSIS — M6281 Muscle weakness (generalized): Secondary | ICD-10-CM | POA: Diagnosis present

## 2021-08-27 DIAGNOSIS — G8929 Other chronic pain: Secondary | ICD-10-CM | POA: Diagnosis present

## 2021-08-28 ENCOUNTER — Other Ambulatory Visit: Payer: Self-pay

## 2021-08-28 MED ORDER — METOPROLOL TARTRATE 25 MG PO TABS: ORAL_TABLET | ORAL | 0 refills | Status: DC

## 2021-09-01 ENCOUNTER — Ambulatory Visit: Payer: No Typology Code available for payment source | Admitting: Physical Therapy

## 2021-09-02 ENCOUNTER — Encounter: Payer: Self-pay | Admitting: Cardiovascular Disease

## 2021-09-02 ENCOUNTER — Ambulatory Visit: Payer: No Typology Code available for payment source | Admitting: Cardiovascular Disease

## 2021-09-02 VITALS — BP 152/88 | HR 65 | Ht 65.0 in | Wt 271.8 lb

## 2021-09-02 DIAGNOSIS — I10 Essential (primary) hypertension: Secondary | ICD-10-CM

## 2021-09-02 DIAGNOSIS — Z6841 Body Mass Index (BMI) 40.0 and over, adult: Secondary | ICD-10-CM | POA: Diagnosis not present

## 2021-09-02 DIAGNOSIS — R002 Palpitations: Secondary | ICD-10-CM | POA: Diagnosis not present

## 2021-09-02 MED ORDER — SPIRONOLACTONE 25 MG PO TABS: 25.0000 mg | ORAL_TABLET | Freq: Every day | ORAL | 3 refills | Status: DC

## 2021-09-02 NOTE — Progress Notes (Signed)
Cardiology Office Note:    Date:  09/02/2021   ID:  [name], DOB 1966-02-08, MRN [id number]  PCP:  [name], MD  Cardiologist:  Kristeen Miss, MD   Referring MD: [name], MD   No chief complaint on file.   Previous notes from 06/09/17    [name] is a 56 y.o. female with a hx of hypertension.  She has been recently having some atypical episodes of chest pain. She was recently seen in the emergency room for atypical chest pain.  A CT scan ruled out pulmonary embolus.  She also ruled out for myocardial infarction.  She describes pain as a aching and heartburn-like sensation underneath her breastbone.   Was started on Dexilant by her primary MD and the pains have resolved.   She has been under lots of stress.  Was found to have a low vit D level, was not getting nearly enough sleep. ( 10 hours of sleep in a week)   Still has palpitations .  Is sleeping a bit better.   Works 3rd shift at [employer]   Has been getting some exercise.   Is limited by some issues with her right foot.  Was up to 5 miles a day   Feb. 1, 2022:  [name] is seen today follow up of her HTN, atypical CP, Has been having more heart palps She had stopped her Bystolic due to insurance not paying for the med. She tried Carvedilol but she felt worse  She has tried metoprolol in the past -  Is now on Indapamide  ( is not on a potassium replacement )  Having leg cramps    June 24, 2020 [name] is seen today for follow up of her HTN, palpitations No CP  Is exercising ,  No cp or dyspnea with that.  September 02, 2021: [name] seen today for follow-up of her hypertension and palpitations.  She has been out of her spironolactone for about a week.  Her blood pressure is slightly elevated because of that.  Try to watch her salt intake She walks 30 minutes a day during her break at work    Past Medical History:  Diagnosis Date   Arthritis    COVID-19 06/2018   Esophageal  stenosis    Esophagitis    GERD (gastroesophageal reflux disease)    Hiatal hernia    Hypertension    Iron deficiency anemia    Migraines    Morbid obesity (HCC)    Sleep apnea         Past Surgical History:  Procedure Laterality Date   ABDOMINAL HYSTERECTOMY     ABDOMINAL HYSTERECTOMY  2003   CARDIOVASCULAR STRESS TEST  07/25/2007   Negative stress echo; ef 55-60%   ESOPHAGEAL MANOMETRY  03/07/2012   Procedure: ESOPHAGEAL MANOMETRY (EM);  Surgeon: Mardella Layman, MD;  Location: [facility];  Service: Endoscopy;  Laterality: N/A;   ESOPHAGEAL MANOMETRY N/A 06/08/2019   Procedure: ESOPHAGEAL MANOMETRY (EM);  Surgeon: Iva Boop, MD;  Location: [facility];  Service: Endoscopy;  Laterality: N/A;   ESOPHAGOGASTRODUODENOSCOPY     ESOPHAGOGASTRODUODENOSCOPY     ESOPHAGOGASTRODUODENOSCOPY (EGD) WITH PROPOFOL N/A 06/08/2019   Procedure: ESOPHAGOGASTRODUODENOSCOPY (EGD) WITH PROPOFOL;  Surgeon: Iva Boop, MD;  Location: [facility];  Service: Endoscopy;  Laterality: N/A;   KNEE ARTHROSCOPY  01/2011   left   KNEE SURGERY      Current Medications: Current Meds  Medication Sig   acetaminophen (TYLENOL)  325 MG tablet Take 650 mg by mouth every 6 (six) hours as needed (for pain.).   famotidine (PEPCID) 40 MG tablet TAKE 1 TABLET(40 MG) BY MOUTH AT BEDTIME   gabapentin (NEURONTIN) 100 MG capsule Take 1-2 capsules (100-200 mg total) by mouth at bedtime.   methocarbamol (ROBAXIN) 750 MG tablet TAKE 1 TABLET(750 MG) BY MOUTH EVERY 8 HOURS AS NEEDED FOR MUSCLE SPASMS   metoprolol tartrate (LOPRESSOR) 25 MG tablet TAKE 1 TABLET(25 MG) BY MOUTH TWICE DAILY. Please Keep upcoming appt. With Dr. Elease Hashimoto in order to receive future refills. Thank You.   omeprazole (PRILOSEC) 40 MG capsule TAKE 1 CAPSULE BY MOUTH EVERY DAY AT 2 AM   [DISCONTINUED] spironolactone (ALDACTONE) 25 MG tablet Take 1 tablet (25 mg total) by mouth daily.     Allergies:   Oxycodone and Pneumococcal vaccines    Social History   Socioeconomic History   Marital status: Married    Spouse name: Not on file   Number of children: 0   Years of education: Not on file   Highest education level: Not on file  Occupational History   Occupation: CNA    Employer: [employer]  Tobacco Use   Smoking status: Never   Smokeless tobacco: Never  Vaping Use   Vaping Use: Never used  Substance and Sexual Activity   Alcohol use: No   Drug use: No   Sexual activity: Not Currently    Birth control/protection: Surgical  Other Topics Concern   Not on file  Social History Narrative   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 There is no history of Heart disease, Cancer, Alcohol abuse, Early death, Hearing loss, Hyperlipidemia, Kidney disease, or Stroke.  ROS:   Please see the history of present illness.     All other systems reviewed and are negative.  EKGs/Labs/Other Studies Reviewed:    The following studies were reviewed today:   EKG:   September 02, 2021: Normal sinus rhythm at 65.  Normal EKG.  Recent Labs: No results found for requested labs within last 365 days.  Recent Lipid Panel    Component Value Date/Time   CHOL 206 (H) 01/01/2020 1515   TRIG 123.0 01/01/2020 1515   HDL 57.70 01/01/2020 1515   CHOLHDL 4 01/01/2020 1515   VLDL 24.6 01/01/2020 1515   LDLCALC 124 (H) 01/01/2020 1515    Physical Exam:     Physical Exam: Blood pressure (!) 152/88, pulse 65, height 5\' 5"  (1.651 m), weight 271 lb 12.8 oz (123.3 kg), SpO2 96 %.  GEN:  Well nourished, well developed in no acute  distress HEENT: Normal NECK: No JVD; No carotid bruits LYMPHATICS: No lymphadenopathy CARDIAC: RRR , no murmurs, rubs, gallops RESPIRATORY:  Clear to auscultation without rales, wheezing or rhonchi  ABDOMEN: Soft, non-tender, non-distended MUSCULOSKELETAL:  No edema; No deformity  SKIN: Warm and dry NEUROLOGIC:  Alert and oriented x 3    ASSESSMENT:    1. Essential hypertension, benign   2. Palpitations   3. Morbid obesity with BMI of  50.0-59.9, adult (HCC)     PLAN:      1.   Palpitations:    no significant palpitations   3.  Hypertension:   she ran out of her spironolactone last week. BP is up slighlty  Will refill  Have her follow up in a 1 year     Medication Adjustments/Labs and Tests Ordered: Current medicines are reviewed at length with the patient today.  Concerns regarding medicines are outlined above.  Orders Placed This Encounter  Procedures   EKG 12-Lead   Meds ordered this encounter  Medications   spironolactone (ALDACTONE) 25 MG tablet    Sig: Take 1 tablet (25 mg total) by mouth daily.    Dispense:  90 tablet    Refill:  3     Signed, Kristeen Miss, MD  09/02/2021 6:06 PM    [facility]

## 2021-09-11 ENCOUNTER — Ambulatory Visit: Payer: No Typology Code available for payment source | Admitting: Physical Therapy

## 2021-09-18 ENCOUNTER — Ambulatory Visit: Payer: No Typology Code available for payment source | Admitting: Physical Therapy

## 2021-09-25 ENCOUNTER — Ambulatory Visit: Payer: No Typology Code available for payment source

## 2021-09-26 NOTE — Progress Notes (Deleted)
   I, Christoper Fabian, LAT, ATC, am serving as scribe for Dr. Clementeen Graham.  Michaelann Gunnoe is a 56 y.o. female who presents to [facility] today for f/u or R knee pain due to DJD and R lateral lower leg pain thought to be lumbar radiculopathy at L5.  She was last seen by Dr. Denyse Amass on 08/05/21 and was referred to PT of which she's completed one visit.  She was also prescribed Gabapentin and advised to use Voltaren gel.  Today, pt reports   Diagnostic testing: R knee XR- 08/01/21; L-spine XR- 01/13/22  Pertinent review of systems: ***  Relevant historical information: ***   Exam:  There were no vitals taken for this visit. General: Well Developed, well nourished, and in no acute distress.   MSK: ***    Lab and Radiology Results No results found for this or any previous visit (from the past 72 hour(s)). No results found.     Assessment and Plan: 56 y.o. female with ***   PDMP not reviewed this encounter. No orders of the defined types were placed in this encounter.  No orders of the defined types were placed in this encounter.    Discussed warning signs or symptoms. Please see discharge instructions. Patient expresses understanding.   ***

## 2021-09-29 ENCOUNTER — Ambulatory Visit: Payer: No Typology Code available for payment source | Admitting: Family Medicine

## 2021-10-02 ENCOUNTER — Ambulatory Visit: Payer: No Typology Code available for payment source | Admitting: Physical Therapy

## 2021-11-11 ENCOUNTER — Other Ambulatory Visit: Payer: Self-pay | Admitting: Internal Medicine

## 2021-11-27 ENCOUNTER — Other Ambulatory Visit (INDEPENDENT_AMBULATORY_CARE_PROVIDER_SITE_OTHER): Payer: 59

## 2021-11-27 ENCOUNTER — Ambulatory Visit (INDEPENDENT_AMBULATORY_CARE_PROVIDER_SITE_OTHER): Payer: 59 | Admitting: Internal Medicine

## 2021-11-27 ENCOUNTER — Encounter: Payer: Self-pay | Admitting: Internal Medicine

## 2021-11-27 VITALS — BP 144/94 | HR 65 | Temp 98.1°F | Resp 16 | Ht 65.0 in | Wt 263.0 lb

## 2021-11-27 DIAGNOSIS — Z0000 Encounter for general adult medical examination without abnormal findings: Secondary | ICD-10-CM | POA: Diagnosis not present

## 2021-11-27 DIAGNOSIS — K760 Fatty (change of) liver, not elsewhere classified: Secondary | ICD-10-CM | POA: Diagnosis not present

## 2021-11-27 DIAGNOSIS — I10 Essential (primary) hypertension: Secondary | ICD-10-CM | POA: Diagnosis not present

## 2021-11-27 DIAGNOSIS — R7303 Prediabetes: Secondary | ICD-10-CM

## 2021-11-27 DIAGNOSIS — Z1211 Encounter for screening for malignant neoplasm of colon: Secondary | ICD-10-CM

## 2021-11-27 DIAGNOSIS — K2100 Gastro-esophageal reflux disease with esophagitis, without bleeding: Secondary | ICD-10-CM | POA: Diagnosis not present

## 2021-11-27 DIAGNOSIS — Z6841 Body Mass Index (BMI) 40.0 and over, adult: Secondary | ICD-10-CM | POA: Diagnosis not present

## 2021-11-27 DIAGNOSIS — M79605 Pain in left leg: Secondary | ICD-10-CM

## 2021-11-27 DIAGNOSIS — Z23 Encounter for immunization: Secondary | ICD-10-CM

## 2021-11-27 DIAGNOSIS — M545 Low back pain, unspecified: Secondary | ICD-10-CM

## 2021-11-27 DIAGNOSIS — M79604 Pain in right leg: Secondary | ICD-10-CM

## 2021-11-27 LAB — BASIC METABOLIC PANEL
BUN: 13 mg/dL (ref 6–23)
CO2: 27 mEq/L (ref 19–32)
Calcium: 9.6 mg/dL (ref 8.4–10.5)
Chloride: 103 mEq/L (ref 96–112)
Creatinine, Ser: 0.69 mg/dL (ref 0.40–1.20)
GFR: 96.37 mL/min (ref >60.00)
Glucose, Bld: 83 mg/dL (ref 70–99)
Potassium: 4.1 mEq/L (ref 3.5–5.1)
Sodium: 138 mEq/L (ref 135–145)

## 2021-11-27 LAB — CBC WITH DIFFERENTIAL/PLATELET
Basophils Absolute: 0.1 10*3/uL (ref 0.0–0.1)
Basophils Relative: 0.6 % (ref 0.0–3.0)
Eosinophils Absolute: 0.4 10*3/uL (ref 0.0–0.7)
Eosinophils Relative: 3.3 % (ref 0.0–5.0)
HCT: 38 % (ref 36.0–46.0)
Hemoglobin: 12.4 g/dL (ref 12.0–15.0)
Lymphocytes Relative: 35.6 % (ref 12.0–46.0)
Lymphs Abs: 4 10*3/uL (ref 0.7–4.0)
MCHC: 32.7 g/dL (ref 30.0–36.0)
MCV: 85.8 fl (ref 78.0–100.0)
Monocytes Absolute: 1 10*3/uL (ref 0.1–1.0)
Monocytes Relative: 9 % (ref 3.0–12.0)
Neutro Abs: 5.8 10*3/uL (ref 1.4–7.7)
Neutrophils Relative %: 51.5 % (ref 43.0–77.0)
Platelets: 284 10*3/uL (ref 150.0–400.0)
RBC: 4.43 Mil/uL (ref 3.87–5.11)
RDW: 14.5 % (ref 11.5–15.5)
WBC: 11.2 10*3/uL — ABNORMAL HIGH (ref 4.0–10.5)

## 2021-11-27 LAB — URINALYSIS, ROUTINE W REFLEX MICROSCOPIC
Bilirubin Urine: NEGATIVE
Hgb urine dipstick: NEGATIVE
Ketones, ur: NEGATIVE
Leukocytes,Ua: NEGATIVE
Nitrite: NEGATIVE
RBC / HPF: NONE SEEN (ref 0–?)
Specific Gravity, Urine: 1.01 (ref 1.000–1.030)
Total Protein, Urine: NEGATIVE
Urine Glucose: NEGATIVE
Urobilinogen, UA: 0.2 (ref 0.0–1.0)
pH: 6 (ref 5.0–8.0)

## 2021-11-27 LAB — TSH: TSH: 1.39 u[IU]/mL (ref 0.35–5.50)

## 2021-11-27 LAB — LIPID PANEL
Cholesterol: 194 mg/dL (ref 0–200)
HDL: 56 mg/dL (ref >39.00)
LDL Cholesterol: 122 mg/dL — ABNORMAL HIGH (ref 0–99)
NonHDL: 138.03
Total CHOL/HDL Ratio: 3
Triglycerides: 82 mg/dL (ref 0.0–149.0)
VLDL: 16.4 mg/dL (ref 0.0–40.0)

## 2021-11-27 LAB — HEPATIC FUNCTION PANEL
ALT: 10 U/L (ref 0–35)
AST: 16 U/L (ref 0–37)
Albumin: 4 g/dL (ref 3.5–5.2)
Alkaline Phosphatase: 80 U/L (ref 39–117)
Bilirubin, Direct: 0.1 mg/dL (ref 0.0–0.3)
Total Bilirubin: 0.5 mg/dL (ref 0.2–1.2)
Total Protein: 7.9 g/dL (ref 6.0–8.3)

## 2021-11-27 MED ORDER — METHOCARBAMOL 750 MG PO TABS: ORAL_TABLET | ORAL | 2 refills | Status: DC

## 2021-11-27 MED ORDER — OMEPRAZOLE 40 MG PO CPDR: DELAYED_RELEASE_CAPSULE | ORAL | 1 refills | Status: DC

## 2021-11-27 NOTE — Patient Instructions (Signed)

## 2021-11-27 NOTE — Progress Notes (Unsigned)
Subjective:  Patient ID: [name], female    DOB: 12-Dec-1965  Age: 56 y.o. MRN: [id number]  CC: Annual Exam, Osteoarthritis, Back Pain, and Hypertension   HPI [name] presents for a CPX and f/up -  She walks about 30 minutes a day and does not experience chest pain, shortness of breath, diaphoresis, or edema.  Outpatient Medications Prior to Visit  Medication Sig Dispense Refill   acetaminophen (TYLENOL) 325 MG tablet Take 650 mg by mouth every 6 (six) hours as needed (for pain.).     famotidine (PEPCID) 40 MG tablet TAKE 1 TABLET(40 MG) BY MOUTH AT BEDTIME 90 tablet 3   gabapentin (NEURONTIN) 100 MG capsule Take 1-2 capsules (100-200 mg total) by mouth at bedtime. 60 capsule 1   metoprolol tartrate (LOPRESSOR) 25 MG tablet TAKE 1 TABLET(25 MG) BY MOUTH TWICE DAILY. Please Keep upcoming appt. With Dr. Acie Fredrickson in order to receive future refills. Thank You. 180 tablet 0   spironolactone (ALDACTONE) 25 MG tablet Take 1 tablet (25 mg total) by mouth daily. 90 tablet 3   methocarbamol (ROBAXIN) 750 MG tablet TAKE 1 TABLET(750 MG) BY MOUTH EVERY 8 HOURS AS NEEDED FOR MUSCLE SPASMS 65 tablet 2   omeprazole (PRILOSEC) 40 MG capsule TAKE 1 CAPSULE BY MOUTH EVERY DAY AT 2 AM 90 capsule 1   No facility-administered medications prior to visit.    ROS Review of Systems  Constitutional: Negative.  Negative for chills, diaphoresis, fatigue and fever.  HENT: Negative.    Eyes: Negative.   Respiratory: Negative.  Negative for cough, chest tightness, shortness of breath and wheezing.   Cardiovascular:  Negative for chest pain, palpitations and leg swelling.  Gastrointestinal:  Negative for abdominal pain, constipation, diarrhea, nausea and vomiting.  Endocrine: Negative.   Genitourinary: Negative.  Negative for difficulty urinating, dysuria and hematuria.  Musculoskeletal:  Positive for arthralgias and back pain. Negative for myalgias and neck pain.  Neurological: Negative.   Negative for dizziness, weakness, light-headedness and headaches.  Hematological:  Negative for adenopathy. Does not bruise/bleed easily.    Objective:  BP (!) 144/94 (BP Location: Right Arm, Patient Position: Sitting, Cuff Size: Large)   Pulse 65   Temp 98.1 F (36.7 C) (Oral)   Resp 16   Ht 5\' 5"  (1.651 m)   Wt 263 lb (119.3 kg)   SpO2 98%   BMI 43.77 kg/m   BP Readings from Last 3 Encounters:  11/27/21 (!) 144/94  09/02/21 (!) 152/88  08/05/21 106/74    Wt Readings from Last 3 Encounters:  11/27/21 263 lb (119.3 kg)  09/02/21 271 lb 12.8 oz (123.3 kg)  08/05/21 269 lb 3.2 oz (122.1 kg)    Physical Exam Vitals reviewed.  HENT:     Mouth/Throat:     Mouth: Mucous membranes are moist.  Eyes:     General: No scleral icterus.    Conjunctiva/sclera: Conjunctivae normal.  Cardiovascular:     Rate and Rhythm: Normal rate and regular rhythm.     Heart sounds: No murmur heard. Pulmonary:     Effort: Pulmonary effort is normal.     Breath sounds: No stridor. No wheezing, rhonchi or rales.  Abdominal:     General: Abdomen is flat.     Palpations: There is no mass.     Tenderness: There is no abdominal tenderness. There is no guarding.     Hernia: No hernia is present.  Musculoskeletal:        General: Normal range of  motion.     Cervical back: Neck supple.     Right lower leg: No edema.     Left lower leg: No edema.  Lymphadenopathy:     Cervical: No cervical adenopathy.  Skin:    General: Skin is warm and dry.  Neurological:     General: No focal deficit present.     Mental Status: She is alert.  Psychiatric:        Mood and Affect: Mood normal.        Behavior: Behavior normal.     Lab Results  Component Value Date   WBC 11.2 (H) 11/27/2021   HGB 12.4 11/27/2021   HCT 38.0 11/27/2021   PLT 284.0 11/27/2021   GLUCOSE 83 11/27/2021   CHOL 194 11/27/2021   TRIG 82.0 11/27/2021   HDL 56.00 11/27/2021   LDLCALC 122 (H) 11/27/2021   ALT 10 11/27/2021    AST 16 11/27/2021   NA 138 11/27/2021   K 4.1 11/27/2021   CL 103 11/27/2021   CREATININE 0.69 11/27/2021   BUN 13 11/27/2021   CO2 27 11/27/2021   TSH 1.39 11/27/2021   INR 1.0 07/06/2007   HGBA1C 6.1 01/01/2020    VAS [place] LOWER EXTREMITY VENOUS (DVT)  Result Date: 08/01/2021  Lower Venous DVT Study Patient Name:  [name]  Date of Exam:   08/01/2021 Medical Rec #: [id number]          Accession #:    [id number] Date of Birth: Feb 02, 1966           Patient Gender: F Patient Age:   77 years Exam Location:  [facility] Procedure:      VAS [place] LOWER EXTREMITY VENOUS (DVT) Referring Phys: Kennyth Arnold BURNS --------------------------------------------------------------------------------  Indications: Acute pain in the right anterior knee radiating to the calf and achilles area x 1 week. Patient denies SOB.  Risk Factors: None identified. Comparison Study: NA Performing Technologist: Tyna Jaksch RVT  Examination Guidelines: A complete evaluation includes B-mode imaging, spectral Doppler, color Doppler, and power Doppler as needed of all accessible portions of each vessel. Bilateral testing is considered an integral part of a complete examination. Limited examinations for reoccurring indications may be performed as noted. The reflux portion of the exam is performed with the patient in reverse Trendelenburg.  +---------+---------------+---------+-----------+----------+--------------+ RIGHT    CompressibilityPhasicitySpontaneityPropertiesThrombus Aging +---------+---------------+---------+-----------+----------+--------------+ CFV      Full           Yes      Yes                                 +---------+---------------+---------+-----------+----------+--------------+ SFJ      Full           Yes      Yes                                 +---------+---------------+---------+-----------+----------+--------------+ FV Prox  Full           Yes      Yes                                  +---------+---------------+---------+-----------+----------+--------------+ FV Mid   Full                                                        +---------+---------------+---------+-----------+----------+--------------+  FV DistalFull           Yes      Yes                                 +---------+---------------+---------+-----------+----------+--------------+ PFV      Full           Yes      Yes                                 +---------+---------------+---------+-----------+----------+--------------+ POP      Full           Yes      Yes                                 +---------+---------------+---------+-----------+----------+--------------+ PTV      Full                                                        +---------+---------------+---------+-----------+----------+--------------+ PERO     Full                                                        +---------+---------------+---------+-----------+----------+--------------+ Gastroc  Full                                                        +---------+---------------+---------+-----------+----------+--------------+ GSV      Full           Yes      Yes                                 +---------+---------------+---------+-----------+----------+--------------+   +----+---------------+---------+-----------+----------+--------------+ LEFTCompressibilityPhasicitySpontaneityPropertiesThrombus Aging +----+---------------+---------+-----------+----------+--------------+ CFV Full           Yes      Yes                                 +----+---------------+---------+-----------+----------+--------------+    Findings reported to Dr. Cheryll CockayneStacy Burns via Epic messaging at 11:55 am.  Summary: RIGHT: - No evidence of deep vein thrombosis in the lower extremity. No indirect evidence of obstruction proximal to the inguinal ligament. - No cystic structure found in the popliteal fossa.  LEFT: - No evidence of  common femoral vein obstruction.  *See table(s) above for measurements and observations. Electronically signed by Julien Nordmannimothy Gollan MD on 08/01/2021 at 4:27:54 PM.    Final    DG Knee Complete 4 Views Right  Result Date: 08/01/2021 CLINICAL DATA:  Right knee pain EXAM: RIGHT KNEE - COMPLETE 4+ VIEW COMPARISON:  None Available. FINDINGS: No acute fracture or dislocation identified. Moderate to severe narrowing of the medial compartment and patellofemoral joint. Small tricompartmental osteophytes. Bones appear  osteopenic. No significant joint effusion visualized. IMPRESSION: Degenerative changes with no acute osseous abnormality identified. Electronically Signed   By: Jannifer Hick M.D.   On: 08/01/2021 15:35    Assessment & Plan:   [name] was seen today for annual exam, osteoarthritis, back pain and hypertension.  Diagnoses and all orders for this visit:  Essential hypertension, benign -     Basic metabolic panel; Future -     CBC with Differential/Platelet; Future -     TSH; Future -     Urinalysis, Routine w reflex microscopic; Future -     Hepatic function panel; Future -     Hepatic function panel -     Urinalysis, Routine w reflex microscopic -     TSH -     CBC with Differential/Platelet -     Basic metabolic panel -     amLODipine (NORVASC) 5 MG tablet; Take 1 tablet (5 mg total) by mouth daily.  Fatty liver disease, nonalcoholic -     Hepatic function panel; Future -     Hepatic function panel  Gastroesophageal reflux disease with esophagitis without hemorrhage  Prediabetes -     Hemoglobin A1c; Future -     Hemoglobin A1c  Morbid obesity with BMI of 50.0-59.9, adult (HCC) -     TSH; Future -     Hemoglobin A1c; Future -     Hemoglobin A1c -     TSH  Routine general medical examination at a health care facility -     Lipid panel; Future -     Lipid panel  Low back pain radiating to both legs -     methocarbamol (ROBAXIN) 750 MG tablet; TAKE 1 TABLET(750 MG) BY  MOUTH EVERY 8 HOURS AS NEEDED FOR MUSCLE SPASMS  Gastroesophageal reflux disease with esophagitis -     omeprazole (PRILOSEC) 40 MG capsule; TAKE 1 CAPSULE BY MOUTH EVERY DAY AT 2 AM  Screen for colon cancer -     Cologuard  Other orders -     Tdap vaccine greater than or equal to 7yo IM   I am having [name] start on amLODipine. I am also having her maintain her acetaminophen, famotidine, gabapentin, metoprolol tartrate, spironolactone, methocarbamol, and omeprazole.  Meds ordered this encounter  Medications   methocarbamol (ROBAXIN) 750 MG tablet    Sig: TAKE 1 TABLET(750 MG) BY MOUTH EVERY 8 HOURS AS NEEDED FOR MUSCLE SPASMS    Dispense:  65 tablet    Refill:  2   omeprazole (PRILOSEC) 40 MG capsule    Sig: TAKE 1 CAPSULE BY MOUTH EVERY DAY AT 2 AM    Dispense:  90 capsule    Refill:  1   amLODipine (NORVASC) 5 MG tablet    Sig: Take 1 tablet (5 mg total) by mouth daily.    Dispense:  90 tablet    Refill:  1     Follow-up: Return in about 6 months (around 05/28/2022).  Sanda Linger, MD

## 2021-11-28 MED ORDER — AMLODIPINE BESYLATE 5 MG PO TABS: 5.0000 mg | ORAL_TABLET | Freq: Every day | ORAL | 1 refills | Status: DC

## 2021-12-01 LAB — HEMOGLOBIN A1C: Hgb A1c MFr Bld: 6.1 % (ref 4.6–6.5)

## 2021-12-10 LAB — COLOGUARD: COLOGUARD: NEGATIVE

## 2021-12-26 ENCOUNTER — Other Ambulatory Visit: Payer: Self-pay | Admitting: *Deleted

## 2021-12-26 MED ORDER — METOPROLOL TARTRATE 25 MG PO TABS: ORAL_TABLET | ORAL | 3 refills | Status: DC

## 2022-05-26 ENCOUNTER — Other Ambulatory Visit: Payer: Self-pay | Admitting: Internal Medicine

## 2022-05-26 DIAGNOSIS — Z1231 Encounter for screening mammogram for malignant neoplasm of breast: Secondary | ICD-10-CM

## 2022-07-09 ENCOUNTER — Ambulatory Visit: Payer: No Typology Code available for payment source

## 2022-07-13 LAB — HM MAMMOGRAPHY

## 2022-08-09 ENCOUNTER — Emergency Department (HOSPITAL_COMMUNITY)
Admission: EM | Admit: 2022-08-09 | Discharge: 2022-08-09 | Disposition: A | Discharge status: Home / Self Care | Payer: Managed Care, Other (non HMO) | Attending: Emergency Medicine | Admitting: Emergency Medicine

## 2022-08-09 ENCOUNTER — Emergency Department (HOSPITAL_COMMUNITY): Payer: Managed Care, Other (non HMO)

## 2022-08-09 ENCOUNTER — Encounter (HOSPITAL_COMMUNITY): Payer: Self-pay

## 2022-08-09 ENCOUNTER — Ambulatory Visit
Admission: EM | Admit: 2022-08-09 | Discharge: 2022-08-09 | Disposition: A | Discharge status: Home / Self Care | Payer: Managed Care, Other (non HMO) | Attending: Internal Medicine | Admitting: Internal Medicine

## 2022-08-09 ENCOUNTER — Other Ambulatory Visit: Payer: Self-pay

## 2022-08-09 DIAGNOSIS — R002 Palpitations: Secondary | ICD-10-CM

## 2022-08-09 DIAGNOSIS — R0789 Other chest pain: Secondary | ICD-10-CM | POA: Diagnosis not present

## 2022-08-09 LAB — BASIC METABOLIC PANEL
Anion gap: 8 (ref 5–15)
BUN: 6 mg/dL (ref 6–20)
CO2: 26 mmol/L (ref 22–32)
Calcium: 9.1 mg/dL (ref 8.9–10.3)
Chloride: 106 mmol/L (ref 98–111)
Creatinine, Ser: 0.59 mg/dL (ref 0.44–1.00)
GFR, Estimated: >60 mL/min (ref >60)
Glucose, Bld: 91 mg/dL (ref 70–99)
Potassium: 3.7 mmol/L (ref 3.5–5.1)
Sodium: 140 mmol/L (ref 135–145)

## 2022-08-09 LAB — CBC
HCT: 39.1 % (ref 36.0–46.0)
Hemoglobin: 12.5 g/dL (ref 12.0–15.0)
MCH: 28.4 pg (ref 26.0–34.0)
MCHC: 32 g/dL (ref 30.0–36.0)
MCV: 88.9 fL (ref 80.0–100.0)
Platelets: 288 10*3/uL (ref 150–400)
RBC: 4.4 MIL/uL (ref 3.87–5.11)
RDW: 13.7 % (ref 11.5–15.5)
WBC: 9.4 10*3/uL (ref 4.0–10.5)
nRBC: 0 % (ref 0.0–0.2)

## 2022-08-09 LAB — TROPONIN I (HIGH SENSITIVITY)
Troponin I (High Sensitivity): 4 ng/L (ref <18)
Troponin I (High Sensitivity): 4 ng/L (ref <18)

## 2022-08-09 LAB — D-DIMER, QUANTITATIVE: D-Dimer, Quant: 0.3 ug/mL-FEU (ref 0.00–0.50)

## 2022-08-09 MED ORDER — ACETAMINOPHEN 500 MG PO TABS
1000.0000 mg | ORAL_TABLET | Freq: Once | ORAL | Status: AC
  Administered 2022-08-09: 1000 mg via ORAL
  Filled 2022-08-09: qty 2

## 2022-08-09 MED ORDER — KETOROLAC TROMETHAMINE 15 MG/ML IJ SOLN
15.0000 mg | Freq: Once | INTRAMUSCULAR | Status: AC
  Administered 2022-08-09: 15 mg via INTRAVENOUS
  Filled 2022-08-09: qty 1

## 2022-08-09 MED ORDER — FAMOTIDINE 20 MG PO TABS
20.0000 mg | ORAL_TABLET | Freq: Once | ORAL | Status: AC
  Administered 2022-08-09: 20 mg via ORAL
  Filled 2022-08-09: qty 1

## 2022-08-09 NOTE — Discharge Instructions (Addendum)
You were seen today for chest pain. We did not identify any emergent cause for your symptoms. Your evaluation is most consistent with nonspecific etiology.   Plan and next steps:   The following may be helpful in managing your symptoms:   Pain- Lidocaine Patches  Apply to affected area for up to 12 hours at a time.   Pain/Fever- Adult Tylenol dosing:  650 mg orally every 4 to 6 hours as needed, MAX: 3250 mg/24 hours   (Extra-strength) 1000 mg orally every 6 hours as needed; MAX: 3000 mg/24 hours   Do not use if you have liver disease. Read the label on the bottle.   Pain/Fever- Adult Ibuprofen Dosing  200 to 400 mg orally every 4 to 6 hours as needed; MAX 1200 mg/day; do not take longer than 10 days   Do not use if you have kidney disease. Read the label on the bottle   Findings:  You may see all of your lab and imaging results utilizing our online portal! Look in this document or ask a team member for your mychart* access information. The most notable results have additionally been verbally communicated with you and your bedside family.    Follow-up Plan:   Follow up with the patient's normal primary care provider for monitoring of this condition within 48 hours.   Signs/Symptoms that would warrant return to the ED:  Please return to the ED if you experience worsening of symptoms or any abrupt changes in your health. Standard of care precautions for your chief complaint have already been verbally communicated with you. Always be on alert for fevers, chills, shortness of breath, chest pains, or sudden changes that warrant immediate evaluation.    Thank you for allowing [place] to be a part of you and your families' care.   Glyn Ade MD

## 2022-08-09 NOTE — ED Notes (Signed)
Patient is being discharged from the Urgent Care and sent to the Emergency Department via POV with family. Per [place], NP, patient is in need of higher level of care due to abnormal EKG. Patient is aware and verbalizes understanding of plan of care.  Vitals:   08/09/22 1403  BP: (!) 167/93  Pulse: 63  Resp: 18  SpO2: 97%

## 2022-08-09 NOTE — ED Provider Notes (Signed)
[facility]    CSN: [id number] Arrival date & time: 08/09/22  1342      History   Chief Complaint Chief Complaint  Patient presents with   Palpitations   Chest Pain    HPI [name] is a 57 y.o. female.   Patient presents with feelings of palpitations that have occurred twice over the past 4 days.  Patient states that she felt palpitations about 4 days ago which resolved.  They returned this morning while she was at church.  She describes it as feeling like her heart is racing.  Reports that she has also has some left-sided chest pain when it occurs.  She describes it as a dull ache on the left side of her chest.  Denies shortness of breath, dizziness, blurred vision, nausea, vomiting but the patient does report a frontal very mild headache.  Reports history of palpitations and currently takes metoprolol daily for it.  Is followed by cardiology.  She is supposed to take spironolactone but reports that she has been out of it and not taken it in a few days.   Palpitations Chest Pain   Past Medical History:  Diagnosis Date   Arthritis    COVID-19 06/2018   Esophageal stenosis    Esophagitis    GERD (gastroesophageal reflux disease)    Hiatal hernia    Hypertension    Iron deficiency anemia    Migraines    Morbid obesity (HCC)    Sleep apnea         Patient Active Problem List   Diagnosis Date Noted   Dysphagia    Esophageal diverticulum    Migraine 08/18/2017   Vitamin D deficiency 04/27/2017   HLD (hyperlipidemia) 06/27/2016   Gastroesophageal reflux disease with esophagitis 12/20/2014   Fatty liver disease, nonalcoholic 12/11/2014   Obstructive sleep apnea 09/28/2013   Morbid obesity with BMI of 50.0-59.9, adult (HCC) 06/28/2013   Esophageal dysmotility 06/28/2013   Prediabetes 06/28/2013   Low back pain radiating to both legs 06/28/2013   Essential hypertension, benign 11/04/2011   Routine general medical examination at a health care  facility 11/04/2011    Past Surgical History:  Procedure Laterality Date   ABDOMINAL HYSTERECTOMY     ABDOMINAL HYSTERECTOMY  2003   CARDIOVASCULAR STRESS TEST  07/25/2007   Negative stress echo; ef 55-60%   ESOPHAGEAL MANOMETRY  03/07/2012   Procedure: ESOPHAGEAL MANOMETRY (EM);  Surgeon: Mardella Layman, MD;  Location: [facility];  Service: Endoscopy;  Laterality: N/A;   ESOPHAGEAL MANOMETRY N/A 06/08/2019   Procedure: ESOPHAGEAL MANOMETRY (EM);  Surgeon: Iva Boop, MD;  Location: [facility];  Service: Endoscopy;  Laterality: N/A;   ESOPHAGOGASTRODUODENOSCOPY     ESOPHAGOGASTRODUODENOSCOPY     ESOPHAGOGASTRODUODENOSCOPY (EGD) WITH PROPOFOL N/A 06/08/2019   Procedure: ESOPHAGOGASTRODUODENOSCOPY (EGD) WITH PROPOFOL;  Surgeon: Iva Boop, MD;  Location: [facility];  Service: Endoscopy;  Laterality: N/A;   KNEE ARTHROSCOPY  01/2011   left   KNEE SURGERY      OB History   No obstetric history on file.      Home Medications    Prior to Admission medications   Medication Sig Start Date End Date Taking? Authorizing Provider  acetaminophen (TYLENOL) 325 MG tablet Take 650 mg by mouth every 6 (six) hours as needed (for pain.).    [provider]  amLODipine (NORVASC) 5 MG tablet Take 1 tablet (5 mg total) by mouth daily. 11/28/21   [name], MD  famotidine (PEPCID) 40 MG tablet TAKE 1 TABLET(40 MG) BY MOUTH AT BEDTIME 09/12/20   Iva Boop, MD  gabapentin (NEURONTIN) 100 MG capsule Take 1-2 capsules (100-200 mg total) by mouth at bedtime. 01/13/21   Pincus Sanes, MD  methocarbamol (ROBAXIN) 750 MG tablet TAKE 1 TABLET(750 MG) BY MOUTH EVERY 8 HOURS AS NEEDED FOR MUSCLE SPASMS 11/27/21   [name], MD  metoprolol tartrate (LOPRESSOR) 25 MG tablet TAKE 1 TABLET(25 MG) BY MOUTH TWICE DAILY. 12/26/21   Nahser, Deloris Ping, MD  omeprazole (PRILOSEC) 40 MG capsule TAKE 1 CAPSULE BY MOUTH EVERY DAY AT 2 AM 11/27/21   [name], MD  spironolactone  (ALDACTONE) 25 MG tablet Take 1 tablet (25 mg total) by mouth daily. 09/02/21   Nahser, Deloris Ping, MD    Family History Family History  Problem Relation Age of Onset   Cervical cancer Mother    Hypertension Sister    Diabetes Sister    Heart disease Neg Hx    Cancer Neg Hx    Alcohol abuse Neg Hx    Early death Neg Hx    Hearing loss Neg Hx    Hyperlipidemia Neg Hx    Kidney disease Neg Hx    Stroke Neg Hx     Social History Social History   Tobacco Use   Smoking status: Never   Smokeless tobacco: Never  Vaping Use   Vaping Use: Never used  Substance Use Topics   Alcohol use: No   Drug use: No     Allergies   Oxycodone and Pneumococcal vaccines   Review of Systems Review of Systems Per HPI  Physical Exam Triage Vital Signs ED Triage Vitals  Enc Vitals Group     BP 08/09/22 1403 (!) 167/93     Pulse Rate 08/09/22 1403 63     Resp 08/09/22 1403 18     Temp --      Temp Source 08/09/22 1403 Oral     SpO2 08/09/22 1403 97 %     Weight --      Height --      Head Circumference --      Peak Flow --      Pain Score 08/09/22 1404 7     Pain Loc --      Pain Edu? --      Excl. in GC? --    No data found.  Updated Vital Signs BP (!) 167/93 (BP Location: Left Arm)   Pulse 63   Resp 18   SpO2 97%   Visual Acuity Right Eye Distance:   Left Eye Distance:   Bilateral Distance:    Right Eye Near:   Left Eye Near:    Bilateral Near:     Physical Exam Constitutional:      General: She is not in acute distress.    Appearance: Normal appearance. She is not toxic-appearing or diaphoretic.  HENT:     Head: Normocephalic and atraumatic.  Eyes:     Extraocular Movements: Extraocular movements intact.     Conjunctiva/sclera: Conjunctivae normal.  Cardiovascular:     Rate and Rhythm: Normal rate and regular rhythm.     Pulses: Normal pulses.     Heart sounds: Normal heart sounds.  Pulmonary:     Effort: Pulmonary effort is normal. No respiratory  distress.     Breath sounds: Normal breath sounds.  Neurological:     General: No focal deficit present.  Mental Status: She is alert and oriented to person, place, and time. Mental status is at baseline.  Psychiatric:        Mood and Affect: Mood normal.        Behavior: Behavior normal.        Thought Content: Thought content normal.        Judgment: Judgment normal.      UC Treatments / Results  Labs (all labs ordered are listed, but only abnormal results are displayed) Labs Reviewed - No data to display  EKG   Radiology No results found.  Procedures Procedures (including critical care time)  Medications Ordered in UC Medications - No data to display  Initial Impression / Assessment and Plan / UC Course  I have reviewed the triage vital signs and the nursing notes.  Pertinent labs & imaging results that were available during my care of the patient were reviewed by me and considered in my medical decision making (see chart for details).     Patient presenting with palpitations and left-sided chest pain.  Patient does have a history of palpitations and takes metoprolol, but I am concerned given that patient is showing premature supraventricular complexes on today's EKG which is changed from previous EKG.  I do think patient needs stat blood work in case there are electrolyte abnormalities or cardiac abnormality.  Cannot do this in urgent care so patient was advised to go to the ER for further evaluation and management.  She was agreeable with plan but wishes for her husband to transport her.  Advised patient to go straight to the emergency department. Final Clinical Impressions(s) / UC Diagnoses   Final diagnoses:  Other chest pain  Palpitations     Discharge Instructions      Go to the emergency department as soon as you leave urgent care for further evaluation and management.    ED Prescriptions   None    PDMP not reviewed this encounter.   Gustavus Bryant, [place] 08/09/22 1425

## 2022-08-09 NOTE — Discharge Instructions (Signed)
Go to the emergency department as soon as you leave urgent care for further evaluation and management. 

## 2022-08-09 NOTE — ED Provider Notes (Signed)
[place] EM[facility]ENC[facility] DEPARTMENT AT [facility] Provider Note   CSN: [id number] Arrival date & time: 08/09/22  1502     History Chief Complaint  Patient presents with   Chest Pain   Palpitations    HPI [name] is a 57 y.o. female presenting for chief plaint of intermittent chest pain and palpitations over the past 4 days.  Occurred this morning while at church.  States that it is left-sided in nature unrelated to her breathing.  She denies fevers chills nausea vomiting syncope or shortness of breath. Otherwise ambulatory tolerating p.o. intake.  Denies any smoking history history of hypertension.  Otherwise compliant on all medications.   Patient's recorded medical, surgical, social, medication list and allergies were reviewed in the Snapshot window as part of the initial history.   Review of Systems   Review of Systems  Constitutional:  Negative for chills and fever.  HENT:  Negative for ear pain and sore throat.   Eyes:  Negative for pain and visual disturbance.  Respiratory:  Negative for cough and shortness of breath.   Cardiovascular:  Positive for chest pain. Negative for palpitations.  Gastrointestinal:  Negative for abdominal pain and vomiting.  Genitourinary:  Negative for dysuria and hematuria.  Musculoskeletal:  Negative for arthralgias and back pain.  Skin:  Negative for color change and rash.  Neurological:  Negative for seizures and syncope.  All other systems reviewed and are negative.   Physical Exam Updated Vital Signs BP 128/73   Pulse (!) 57   Temp 98.2 F (36.8 C) (Oral)   Resp 18   SpO2 100%  Physical Exam Vitals and nursing note reviewed.  Constitutional:      General: She is not in acute distress.    Appearance: She is well-developed.  HENT:     Head: Normocephalic and atraumatic.  Eyes:     Conjunctiva/sclera: Conjunctivae normal.  Cardiovascular:     Rate and Rhythm: Normal rate and regular rhythm.     Heart sounds: No  murmur heard. Pulmonary:     Effort: Pulmonary effort is normal. No respiratory distress.     Breath sounds: Normal breath sounds.  Abdominal:     General: There is no distension.     Palpations: Abdomen is soft.     Tenderness: There is no abdominal tenderness. There is no right CVA tenderness or left CVA tenderness.  Musculoskeletal:        General: No swelling or tenderness. Normal range of motion.     Cervical back: Neck supple.  Skin:    General: Skin is warm and dry.  Neurological:     General: No focal deficit present.     Mental Status: She is alert and oriented to person, place, and time. Mental status is at baseline.     Cranial Nerves: No cranial nerve deficit.      ED Course/ Medical Decision Making/ A&P    Procedures Procedures   Medications Ordered in ED Medications  acetaminophen (TYLENOL) tablet 1,000 mg (1,000 mg Oral Given 08/09/22 1747)  ketorolac (TORADOL) 15 MG/ML injection 15 mg (15 mg Intravenous Given 08/09/22 1747)  famotidine (PEPCID) tablet 20 mg (20 mg Oral Given 08/09/22 1747)   Medical Decision Making: [name] is a 57 y.o. female who presented to the ED today with chest pain, detailed above.  Based on patient's comorbidities, patient has a heart score of 2.    Additional history discussed with patient's family/caregivers.  Patient placed on continuous  vitals and telemetry monitoring while in ED which was reviewed periodically.  Complete initial physical exam performed, notably the patient was HDS in NAD.   Reviewed and confirmed nursing documentation for past medical history, family history, social history.    Initial Assessment: With the patient's presentation of left-sided chest pain, most likely diagnosis is musculoskeletal chest pain versus GERD, although ACS remains on the differential. Other diagnoses were considered including (but not limited to) pulmonary embolism, community-acquired pneumonia, aortic dissection, pneumothorax,  underlying bony abnormality, anemia. These are considered less likely due to history of present illness and physical exam findings.    In particular, concerning pulmonary embolism: Patient is PERC + and the they deny malignancy, recent surgery, history of DVT, or calf tenderness leading to a low risk Wells score. Aortic Dissection also reconsidered but seems less likely based on the location, quality, onset, and severity of symptoms in this case. Patient has a lack of serious comorbidities for this condition including a lack of Smoking. Patient also has a lack of underlying history of AD or TAA.  This is most consistent with an acute life/limb threatening illness complicated by underlying chronic conditions.   Initial Plan: Evaluate for ACS with delta troponin and EKG evaluated as below  Evaluate for dissection, bony abnormality, or pneumonia with chest x-ray and screening laboratory evaluation including CBC, BMP  Further evaluation for pulmonary embolism indicated at this time based on patient's PERC and Wells score.  Will start with a D-dimer and plan to proceed to CTA PE study if indicated Further evaluation for Thoracic Aortic Dissection not indicated at this time based on patient's clinical history and PE findings.   Initial Study Results: EKG was reviewed independently. Rate, rhythm, axis, intervals all examined and without medically relevant abnormality. ST segments without concerns for elevations.    Laboratory  Delta troponin demonstrated NAA  CBC and BMP without obvious metabolic or inflammatory abnormalities requiring further evaluation   Radiology  DG Chest 2 View  Result Date: 08/09/2022 CLINICAL DATA:  Chest pain EXAM: CHEST - 2 VIEW COMPARISON:  December 27, 2019 FINDINGS: The heart size and mediastinal contours are within normal limits. Both lungs are clear. The visualized skeletal structures are unremarkable. IMPRESSION: No active cardiopulmonary disease. Electronically Signed    By: Gerome Sam III M.D.   On: 08/09/2022 16:54    Final Assessment and Plan: Patient's HPI and PE findings are without acute pathology.  On reassessment all symptoms have resolved.  Patient is ambulatory tolerating p.o. intake.  Given resolution of symptoms, low heart score, negative objective evaluation patient is stable for outpatient care management follow-up with her primary care provider. Disposition:  I have considered need for hospitalization, however, considering all of the above, I believe this patient is stable for discharge at this time.  Patient/family educated about specific return precautions for given chief complaint and symptoms.  Patient/family educated about follow-up with PCP.     Patient/family expressed understanding of return precautions and need for follow-up. Patient spoken to regarding all imaging and laboratory results and appropriate follow up for these results. All education provided in verbal form with additional information in written form. Time was allowed for answering of patient questions. Patient discharged.    Emergency Department Medication Summary:   Medications  acetaminophen (TYLENOL) tablet 1,000 mg (1,000 mg Oral Given 08/09/22 1747)  ketorolac (TORADOL) 15 MG/ML injection 15 mg (15 mg Intravenous Given 08/09/22 1747)  famotidine (PEPCID) tablet 20 mg (20 mg Oral Given 08/09/22 1747)  Clinical Impression:  1. Heart palpitations      Discharge   Final Clinical Impression(s) / ED Diagnoses Final diagnoses:  Heart palpitations    Rx / DC Orders ED Discharge Orders     None         Glyn Ade, MD 08/09/22 1930

## 2022-08-09 NOTE — ED Triage Notes (Signed)
Patient presents to [facility] for heart palpitations, fatigue states noticed 1st episode Thursday, again today. States she felt like she was punched in chest. Intermittent chest pain, describes the pain as dull and located on left side of chest. Hx of palpitations, takes metoprolol. Today when she checked her HR it was 64 at 1300. She states she feels her heart is skipping a beat.   Denies SOB, nausea.

## 2022-08-09 NOTE — ED Triage Notes (Signed)
Pt came in via POV d/t CP that started 3 days ago, has been feeling palpitations intermittently & once it started again today while in church she decided to come in for eval. She went to [facility] & was told her rhythm was irregular. A/Ox4, rates her pain 6/10 when it happens & denies any radiation.

## 2022-08-11 ENCOUNTER — Telehealth: Payer: Self-pay

## 2022-08-11 NOTE — Transitions of Care (Post Inpatient/ED Visit) (Signed)
   08/11/2022  Name: [name] MRN: [id number] DOB: 09/28/1965  Today's TOC FU Call Status: Today's TOC FU Call Status:: Successful TOC FU Call Competed TOC FU Call Complete Date: 08/11/22  Transition Care Management Follow-up Telephone Call Date of Discharge: 08/09/22 Discharge Facility: [facility]) Type of Discharge: Emergency Department Reason for ED Visit: Other: (palpitation) How have you been since you were released from the hospital?: Better Any questions or concerns?: No  Items Reviewed: Did you receive and understand the discharge instructions provided?: Yes Medications obtained,verified, and reconciled?: Yes (Medications Reviewed) Any new allergies since your discharge?: No Dietary orders reviewed?: Yes Do you have support at home?: Yes People in Home: spouse  Medications Reviewed Today: Medications Reviewed Today     Reviewed by Karena Addison, LPN (Licensed Practical Nurse) on 08/11/22 at 1236  Med List Status: <None>   Medication Order Taking? Sig Documenting Provider Last Dose Status Informant  acetaminophen (TYLENOL) 325 MG tablet [id number] Yes Take 650 mg by mouth every 6 (six) hours as needed (for pain.). [provider] Taking Active Self  amLODipine (NORVASC) 5 MG tablet [id number] Yes Take 1 tablet (5 mg total) by mouth daily. [name], MD Taking Active   famotidine (PEPCID) 40 MG tablet [id number] Yes TAKE 1 TABLET(40 MG) BY MOUTH AT BEDTIME Iva Boop, MD Taking Active   gabapentin (NEURONTIN) 100 MG capsule [id number] Yes Take 1-2 capsules (100-200 mg total) by mouth at bedtime. Pincus Sanes, MD Taking Active   methocarbamol (ROBAXIN) 750 MG tablet [id number] Yes TAKE 1 TABLET(750 MG) BY MOUTH EVERY 8 HOURS AS NEEDED FOR MUSCLE SPASMS [name], MD Taking Active   metoprolol tartrate (LOPRESSOR) 25 MG tablet [id number] Yes TAKE 1 TABLET(25 MG) BY MOUTH TWICE DAILY. Nahser, Deloris Ping, MD Taking Active   omeprazole (PRILOSEC)  40 MG capsule [id number] Yes TAKE 1 CAPSULE BY MOUTH EVERY DAY AT 2 AM [name], MD Taking Active   spironolactone (ALDACTONE) 25 MG tablet [id number] Yes Take 1 tablet (25 mg total) by mouth daily. Nahser, Deloris Ping, MD Taking Active             Home Care and Equipment/Supplies: Were Home Health Services Ordered?: NA Any new equipment or medical supplies ordered?: NA  Functional Questionnaire: Do you need assistance with bathing/showering or dressing?: No Do you need assistance with meal preparation?: No Do you need assistance with eating?: No Do you have difficulty maintaining continence: No Do you need assistance with getting out of bed/getting out of a chair/moving?: No Do you have difficulty managing or taking your medications?: No  Follow up appointments reviewed: PCP Follow-up appointment confirmed?: Yes Date of PCP follow-up appointment?: 08/26/22 Follow-up Provider: Dr Yetta Barre [facility] Follow-up appointment confirmed?: NA Do you need transportation to your follow-up appointment?: No Do you understand care options if your condition(s) worsen?: Yes-patient verbalized understanding    SIGNATURE Karena Addison, LPN [facility] Nurse Health Advisor Direct Dial [phone]

## 2022-08-26 ENCOUNTER — Encounter: Payer: Self-pay | Admitting: Internal Medicine

## 2022-08-26 ENCOUNTER — Ambulatory Visit (INDEPENDENT_AMBULATORY_CARE_PROVIDER_SITE_OTHER): Payer: Managed Care, Other (non HMO) | Admitting: Internal Medicine

## 2022-08-26 VITALS — BP 144/86 | HR 67 | Temp 98.4°F | Ht 65.0 in | Wt 257.0 lb

## 2022-08-26 DIAGNOSIS — I10 Essential (primary) hypertension: Secondary | ICD-10-CM

## 2022-08-26 DIAGNOSIS — R002 Palpitations: Secondary | ICD-10-CM | POA: Diagnosis not present

## 2022-08-26 MED ORDER — AMLODIPINE BESYLATE 5 MG PO TABS
5.0000 mg | ORAL_TABLET | Freq: Every day | ORAL | 0 refills | Status: DC
Start: 2022-08-26 — End: 2023-01-21

## 2022-08-26 MED ORDER — TRIAMTERENE-HCTZ 37.5-25 MG PO CAPS
1.0000 | ORAL_CAPSULE | Freq: Every day | ORAL | 0 refills | Status: AC
Start: 2022-08-26 — End: ?

## 2022-08-26 NOTE — Progress Notes (Signed)
Subjective:  Patient ID: [name], female    DOB: January 30, 1966  Age: 57 y.o. MRN: [id number]  CC: Hypertension   HPI Charnee Turnipseed presents for f/up ---  Discussed the use of AI scribe software for clinical note transcription with the patient, who gave verbal consent to proceed.  History of Present Illness   The patient, a CNA with a history of hypertension, presented for a follow-up visit after experiencing palpitations and chest pain two weeks prior. The palpitations were described as a sensation of skipped beats, which had been experienced before. The chest pain was described as unbearable, occurring after a night shift at work, and was severe enough to consider calling 911. The patient managed the pain with prayer and rest and called out of work due to increasing stress levels.  The palpitations recurred during a church service a few days later, prompting a visit to urgent care. An EKG was performed, which showed abnormal findings, leading to a referral to [facility]. A repeat EKG at [facility] was reported as normal. The patient speculated that excessive caffeine intake may have triggered the palpitations, as they had been consuming multiple cups of coffee daily due to their night shift work schedule.  Since the incident, the patient reported occasional recurrence of palpitations, but not as severe as the initial episode. They also reported a slight headache on the day of the visit, which they attributed to their hypertension. No other symptoms such as shortness of breath, dizziness, lightheadedness, leg or feet swelling, or changes in weight or appetite were reported. The patient is currently on metoprolol, amlodipine, and spironolactone for blood pressure control, and also takes medication for acid reflux.       Correction: EKG was normal. Outpatient Medications Prior to Visit  Medication Sig Dispense Refill   acetaminophen (TYLENOL) 325 MG tablet Take 650 mg by mouth every 6  (six) hours as needed (for pain.).     famotidine (PEPCID) 40 MG tablet TAKE 1 TABLET(40 MG) BY MOUTH AT BEDTIME 90 tablet 3   omeprazole (PRILOSEC) 40 MG capsule TAKE 1 CAPSULE BY MOUTH EVERY DAY AT 2 AM 90 capsule 1   amLODipine (NORVASC) 5 MG tablet Take 1 tablet (5 mg total) by mouth daily. 90 tablet 1   gabapentin (NEURONTIN) 100 MG capsule Take 1-2 capsules (100-200 mg total) by mouth at bedtime. (Patient not taking: Reported on 08/28/2022) 60 capsule 1   methocarbamol (ROBAXIN) 750 MG tablet TAKE 1 TABLET(750 MG) BY MOUTH EVERY 8 HOURS AS NEEDED FOR MUSCLE SPASMS (Patient not taking: Reported on 08/28/2022) 65 tablet 2   metoprolol tartrate (LOPRESSOR) 25 MG tablet TAKE 1 TABLET(25 MG) BY MOUTH TWICE DAILY. 180 tablet 3   spironolactone (ALDACTONE) 25 MG tablet Take 1 tablet (25 mg total) by mouth daily. 90 tablet 3   No facility-administered medications prior to visit.    ROS Review of Systems  Constitutional:  Negative for diaphoresis and fatigue.  HENT: Negative.    Eyes: Negative.   Respiratory:  Negative for cough, chest tightness, shortness of breath and wheezing.   Cardiovascular:  Positive for palpitations. Negative for chest pain and leg swelling.  Gastrointestinal:  Negative for abdominal pain, constipation, diarrhea, nausea and vomiting.  Endocrine: Negative.   Genitourinary: Negative.  Negative for difficulty urinating.  Musculoskeletal: Negative.  Negative for arthralgias, joint swelling and myalgias.  Skin: Negative.   Neurological:  Negative for dizziness, weakness, light-headedness and headaches.  Hematological:  Negative for adenopathy. Does not bruise/bleed  easily.  Psychiatric/Behavioral: Negative.      Objective:  BP (!) 144/86 (BP Location: Left Arm, Patient Position: Sitting, Cuff Size: Large)   Pulse 67   Temp 98.4 F (36.9 C) (Oral)   Ht 5\' 5"  (1.651 m)   Wt 257 lb (116.6 kg)   SpO2 97%   BMI 42.77 kg/m   BP Readings from Last 3 Encounters:   08/28/22 118/78  08/26/22 (!) 144/86  08/09/22 118/62    Wt Readings from Last 3 Encounters:  08/28/22 255 lb 3.2 oz (115.8 kg)  08/26/22 257 lb (116.6 kg)  11/27/21 263 lb (119.3 kg)    Physical Exam Vitals reviewed.  HENT:     Nose: Nose normal.  Eyes:     General: No scleral icterus.    Conjunctiva/sclera: Conjunctivae normal.  Cardiovascular:     Rate and Rhythm: Normal rate and regular rhythm.     Heart sounds: No murmur heard. Pulmonary:     Effort: Pulmonary effort is normal.     Breath sounds: No stridor. No wheezing, rhonchi or rales.  Abdominal:     General: Abdomen is protuberant. Bowel sounds are normal. There is no distension.     Palpations: Abdomen is soft. There is no hepatomegaly, splenomegaly or mass.     Tenderness: There is no abdominal tenderness.  Musculoskeletal:        General: Normal range of motion.     Cervical back: Neck supple.     Right lower leg: No edema.     Left lower leg: No edema.  Lymphadenopathy:     Cervical: No cervical adenopathy.  Skin:    General: Skin is warm and dry.  Neurological:     General: No focal deficit present.     Mental Status: She is alert. Mental status is at baseline.  Psychiatric:        Mood and Affect: Mood normal.        Behavior: Behavior normal.     Lab Results  Component Value Date   WBC 9.4 08/09/2022   HGB 12.5 08/09/2022   HCT 39.1 08/09/2022   PLT 288 08/09/2022   GLUCOSE 91 08/09/2022   CHOL 194 11/27/2021   TRIG 82.0 11/27/2021   HDL 56.00 11/27/2021   LDLCALC 122 (H) 11/27/2021   ALT 10 11/27/2021   AST 16 11/27/2021   NA 140 08/09/2022   K 3.7 08/09/2022   CL 106 08/09/2022   CREATININE 0.59 08/09/2022   BUN 6 08/09/2022   CO2 26 08/09/2022   TSH 1.39 11/27/2021   INR 1.0 07/06/2007   HGBA1C 6.1 11/27/2021    DG Chest 2 View  Result Date: 08/09/2022 CLINICAL DATA:  Chest pain EXAM: CHEST - 2 VIEW COMPARISON:  December 27, 2019 FINDINGS: The heart size and mediastinal  contours are within normal limits. Both lungs are clear. The visualized skeletal structures are unremarkable. IMPRESSION: No active cardiopulmonary disease. Electronically Signed   By: Gerome Sam III M.D.   On: 08/09/2022 16:54    Assessment & Plan:   Essential hypertension, benign- She has not achieved her blood pressure goal of 130/80 and her recent potassium level was 3.7.  Will discontinue spironolactone and upgrade to the combination of triamterene and hydrochlorothiazide.  She will continue the current dose of amlodipine. -     Triamterene-HCTZ; Take 1 each (1 capsule total) by mouth daily.  Dispense: 90 capsule; Refill: 0 -     amLODIPine Besylate; Take 1 tablet (5 mg  total) by mouth daily.  Dispense: 90 tablet; Refill: 0  Intermittent palpitations- She will see cardiology about this.     Follow-up: Return in about 3 months (around 11/26/2022).  Sanda Linger, MD

## 2022-08-26 NOTE — Patient Instructions (Signed)
Hypertension, Adult High blood pressure (hypertension) is when the force of blood pumping through the arteries is too strong. The arteries are the blood vessels that carry blood from the heart throughout the body. Hypertension forces the heart to work harder to pump blood and may cause arteries to become narrow or stiff. Untreated or uncontrolled hypertension can lead to a heart attack, heart failure, a stroke, kidney disease, and other problems. A blood pressure reading consists of a higher number over a lower number. Ideally, your blood pressure should be below 120/80. The first ("top") number is called the systolic pressure. It is a measure of the pressure in your arteries as your heart beats. The second ("bottom") number is called the diastolic pressure. It is a measure of the pressure in your arteries as the heart relaxes. What are the causes? The exact cause of this condition is not known. There are some conditions that result in high blood pressure. What increases the risk? Certain factors may make you more likely to develop high blood pressure. Some of these risk factors are under your control, including: Smoking. Not getting enough exercise or physical activity. Being overweight. Having too much fat, sugar, calories, or salt (sodium) in your diet. Drinking too much alcohol. Other risk factors include: Having a personal history of heart disease, diabetes, high cholesterol, or kidney disease. Stress. Having a family history of high blood pressure and high cholesterol. Having obstructive sleep apnea. Age. The risk increases with age. What are the signs or symptoms? High blood pressure may not cause symptoms. Very high blood pressure (hypertensive crisis) may cause: Headache. Fast or irregular heartbeats (palpitations). Shortness of breath. Nosebleed. Nausea and vomiting. Vision changes. Severe chest pain, dizziness, and seizures. How is this diagnosed? This condition is diagnosed by  measuring your blood pressure while you are seated, with your arm resting on a flat surface, your legs uncrossed, and your feet flat on the floor. The cuff of the blood pressure monitor will be placed directly against the skin of your upper arm at the level of your heart. Blood pressure should be measured at least twice using the same arm. Certain conditions can cause a difference in blood pressure between your right and left arms. If you have a high blood pressure reading during one visit or you have normal blood pressure with other risk factors, you may be asked to: Return on a different day to have your blood pressure checked again. Monitor your blood pressure at home for 1 week or longer. If you are diagnosed with hypertension, you may have other blood or imaging tests to help your health care provider understand your overall risk for other conditions. How is this treated? This condition is treated by making healthy lifestyle changes, such as eating healthy foods, exercising more, and reducing your alcohol intake. You may be referred for counseling on a healthy diet and physical activity. Your health care provider may prescribe medicine if lifestyle changes are not enough to get your blood pressure under control and if: Your systolic blood pressure is above 130. Your diastolic blood pressure is above 80. Your personal target blood pressure may vary depending on your medical conditions, your age, and other factors. Follow these instructions at home: Eating and drinking  Eat a diet that is high in fiber and potassium, and low in sodium, added sugar, and fat. An example of this eating plan is called the DASH diet. DASH stands for Dietary Approaches to Stop Hypertension. To eat this way: Eat   plenty of fresh fruits and vegetables. Try to fill one half of your plate at each meal with fruits and vegetables. Eat whole grains, such as whole-wheat pasta, brown rice, or whole-grain bread. Fill about one  fourth of your plate with whole grains. Eat or drink low-fat dairy products, such as skim milk or low-fat yogurt. Avoid fatty cuts of meat, processed or cured meats, and poultry with skin. Fill about one fourth of your plate with lean proteins, such as fish, chicken without skin, beans, eggs, or tofu. Avoid pre-made and processed foods. These tend to be higher in sodium, added sugar, and fat. Reduce your daily sodium intake. Many people with hypertension should eat less than 1,500 mg of sodium a day. Do not drink alcohol if: Your health care provider tells you not to drink. You are pregnant, may be pregnant, or are planning to become pregnant. If you drink alcohol: Limit how much you have to: 0-1 drink a day for women. 0-2 drinks a day for men. Know how much alcohol is in your drink. In the [place] one drink equals one 12 oz bottle of beer (355 mL), one 5 oz glass of wine (148 mL), or one 1 oz glass of hard liquor (44 mL). Lifestyle  Work with your health care provider to maintain a healthy body weight or to lose weight. Ask what an ideal weight is for you. Get at least 30 minutes of exercise that causes your heart to beat faster (aerobic exercise) most days of the week. Activities may include walking, swimming, or biking. Include exercise to strengthen your muscles (resistance exercise), such as Pilates or lifting weights, as part of your weekly exercise routine. Try to do these types of exercises for 30 minutes at least 3 days a week. Do not use any products that contain nicotine or tobacco. These products include cigarettes, chewing tobacco, and vaping devices, such as e-cigarettes. If you need help quitting, ask your health care provider. Monitor your blood pressure at home as told by your health care provider. Keep all follow-up visits. This is important. Medicines Take over-the-counter and prescription medicines only as told by your health care provider. Follow directions carefully. Blood  pressure medicines must be taken as prescribed. Do not skip doses of blood pressure medicine. Doing this puts you at risk for problems and can make the medicine less effective. Ask your health care provider about side effects or reactions to medicines that you should watch for. Contact a health care provider if you: Think you are having a reaction to a medicine you are taking. Have headaches that keep coming back (recurring). Feel dizzy. Have swelling in your ankles. Have trouble with your vision. Get help right away if you: Develop a severe headache or confusion. Have unusual weakness or numbness. Feel faint. Have severe pain in your chest or abdomen. Vomit repeatedly. Have trouble breathing. These symptoms may be an emergency. Get help right away. Call 911. Do not wait to see if the symptoms will go away. Do not drive yourself to the hospital. Summary Hypertension is when the force of blood pumping through your arteries is too strong. If this condition is not controlled, it may put you at risk for serious complications. Your personal target blood pressure may vary depending on your medical conditions, your age, and other factors. For most people, a normal blood pressure is less than 120/80. Hypertension is treated with lifestyle changes, medicines, or a combination of both. Lifestyle changes include losing weight, eating a healthy,   low-sodium diet, exercising more, and limiting alcohol. This information is not intended to replace advice given to you by your health care provider. Make sure you discuss any questions you have with your health care provider. Document Revised: 12/31/2020 Document Reviewed: 12/31/2020 Elsevier Patient Education  2024 Elsevier Inc.  

## 2022-08-27 NOTE — Progress Notes (Signed)
  Cardiology Office Note:    Date:  08/28/2022  ID:  [name], DOB 10/09/1965, MRN [id number] PCP: [name], MD  [facility] Providers Cardiologist:  Kristeen Miss, MD       Patient Profile:      Hypertension  Palpitations  OSA  Obesity GERD, esophagitis        History of Present Illness:   [name] is a 57 y.o. female who returns for follow up of HTN. She was last seen by Dr. Elease Hashimoto in 08/2021. She went to the ED on 08/09/22 with chest pain and palpitations. Her EKG showed NSR and no acute changes. Hgb, K+, Creatinine, DDimer and hsTroponins x 2 were all normal. She had improved symptoms with Toradol, Tylenol and Pepcid.  She is here alone.  She is a Lawyer at [employer].  She notes a lot of stress at work and works third shift.  She developed chest discomfort with palpitations last week after getting off of work.  Symptoms were severe at times and she consider going to the emergency room at that point.  She does not have exertional chest discomfort.  She only has chest discomfort with palpitations.  She has not had shortness of breath, arm or jaw discomfort, orthopnea, leg edema or syncope.    Review of Systems  Constitutional: Negative for fever.  Respiratory:  Negative for cough.   Gastrointestinal:  Positive for diarrhea (chronic). Negative for hematochezia, melena and vomiting.  Genitourinary:  Negative for hematuria.   See HPI    Studies Reviewed:        Risk Assessment/Calculations:             Physical Exam:   VS:  BP 118/78   Pulse 62   Ht 5\' 5"  (1.651 m)   Wt 255 lb 3.2 oz (115.8 kg)   SpO2 98%   BMI 42.47 kg/m    Wt Readings from Last 3 Encounters:  08/28/22 255 lb 3.2 oz (115.8 kg)  08/26/22 257 lb (116.6 kg)  11/27/21 263 lb (119.3 kg)    Constitutional:      Appearance: Healthy appearance. Not in distress.  Neck:     Vascular: JVD normal.  Pulmonary:     Breath sounds: Normal breath sounds. No wheezing. No rales.   Cardiovascular:     Normal rate. Regular rhythm.     Murmurs: There is no murmur.  Edema:    Peripheral edema absent.  Abdominal:     Palpations: Abdomen is soft.       ASSESSMENT AND PLAN:   Palpitations Etiology not entirely clear.  She likely has PVCs or PACs that are symptomatic.  They were severe enough to bring her to the emergency room recently.  Arrange 14-day ZIO XT to further evaluate.  Continue metoprolol tartrate 25 mg twice daily.  She can take an extra 12.5 to 25 mg as needed for increased palpitations.  Follow-up in 6 months or sooner if significant arrhythmia noted.  Essential hypertension, benign Blood pressure well-controlled.  Continue amlodipine 5 mg daily, Lopressor 25 mg twice daily, triamterene/HCTZ 37.5/25 mg daily.  Recent creatinine, potassium normal.     Dispo:  Return in about 6 months (around 02/27/2023) for Routine Follow Up, w/ Dr. Elease Hashimoto, or Tereso Newcomer, PA-C.  Signed, Tereso Newcomer, PA-C

## 2022-08-28 ENCOUNTER — Ambulatory Visit (INDEPENDENT_AMBULATORY_CARE_PROVIDER_SITE_OTHER): Payer: Managed Care, Other (non HMO)

## 2022-08-28 ENCOUNTER — Encounter: Payer: Self-pay | Admitting: Physician Assistant

## 2022-08-28 ENCOUNTER — Ambulatory Visit: Payer: Managed Care, Other (non HMO) | Attending: Physician Assistant | Admitting: Physician Assistant

## 2022-08-28 VITALS — BP 118/78 | HR 62 | Ht 65.0 in | Wt 255.2 lb

## 2022-08-28 DIAGNOSIS — I10 Essential (primary) hypertension: Secondary | ICD-10-CM

## 2022-08-28 DIAGNOSIS — R002 Palpitations: Secondary | ICD-10-CM

## 2022-08-28 MED ORDER — METOPROLOL TARTRATE 25 MG PO TABS: ORAL_TABLET | ORAL | 3 refills | Status: DC

## 2022-08-28 NOTE — Patient Instructions (Signed)
Medication Instructions:  Your physician has recommended you make the following change in your medication:  You may take 1/2-1 extra Metoprolol as needed for palpitations   *If you need a refill on your cardiac medications before your next appointment, please call your pharmacy*   Lab Work: None ordered  If you have labs (blood work) drawn today and your tests are completely normal, you will receive your results only by: MyChart Message (if you have MyChart) OR A paper copy in the mail If you have any lab test that is abnormal or we need to change your treatment, we will call you to review the results.   Testing/Procedures: [name]- Long Term Monitor Instructions  Your physician has requested you wear a ZIO patch monitor for 14 days.  This is a single patch monitor. [employer] supplies one patch monitor per enrollment. Additional stickers are not available. Please do not apply patch if you will be having a Nuclear Stress Test,  Echocardiogram, Cardiac CT, MRI, or Chest Xray during the period you would be wearing the  monitor. The patch cannot be worn during these tests. You cannot remove and re-apply the  ZIO XT patch monitor.  Your ZIO patch monitor will be mailed 3 day USPS to your address on file. It may take 3-5 days  to receive your monitor after you have been enrolled.  Once you have received your monitor, please review the enclosed instructions. Your monitor  has already been registered assigning a specific monitor serial # to you.  Billing and Patient Assistance Program Information  We have supplied [employer] with any of your insurance information on file for billing purposes. [employer] offers a sliding scale Patient Assistance Program for patients that do not have  insurance, or whose insurance does not completely cover the cost of the ZIO monitor.  You must apply for the Patient Assistance Program to qualify for this discounted rate.  To apply, please call [employer] at  [phone], select option 4, select option 2, ask to apply for  Patient Assistance Program. [name] will ask your household income, and how many people  are in your household. They will quote your out-of-pocket cost based on that information.  [employer] will also be able to set up a 23-month, interest-free payment plan if needed.  Applying the monitor   Shave hair from upper left chest.  Hold abrader disc by orange tab. Rub abrader in 40 strokes over the upper left chest as  indicated in your monitor instructions.  Clean area with 4 enclosed alcohol pads. Let dry.  Apply patch as indicated in monitor instructions. Patch will be placed under collarbone on left  side of chest with arrow pointing upward.  Rub patch adhesive wings for 2 minutes. Remove white label marked "1". Remove the white  label marked "2". Rub patch adhesive wings for 2 additional minutes.  While looking in a mirror, press and release button in center of patch. A small green light will  flash 3-4 times. This will be your only indicator that the monitor has been turned on.  Do not shower for the first 24 hours. You may shower after the first 24 hours.  Press the button if you feel a symptom. You will hear a small click. Record Date, Time and  Symptom in the Patient Logbook.  When you are ready to remove the patch, follow instructions on the last 2 pages of Patient  Logbook. Stick patch monitor onto the last page of Patient Logbook.  Place  Patient Logbook in the blue and white box. Use locking tab on box and tape box closed  securely. The blue and white box has prepaid postage on it. Please place it in the mailbox as  soon as possible. Your physician should have your test results approximately 7 days after the  monitor has been mailed back to [facility].  Call [facility] Customer Care at [phone] if you have questions regarding  your ZIO XT patch monitor. Call them immediately if you see an orange light  blinking on your  monitor.  If your monitor falls off in less than 4 days, contact our Monitor department at [phone].  If your monitor becomes loose or falls off after 4 days call [employer] at [phone] for  suggestions on securing your monitor    Follow-Up: At [facility], you and your health needs are our priority.  As part of our continuing mission to provide you with exceptional heart care, we have created designated Provider Care Teams.  These Care Teams include your primary Cardiologist (physician) and Advanced Practice Providers (APPs -  Physician Assistants and Nurse Practitioners) who all work together to provide you with the care you need, when you need it.  We recommend signing up for the patient portal called "MyChart".  Sign up information is provided on this After Visit Summary.  MyChart is used to connect with patients for Virtual Visits (Telemedicine).  Patients are able to view lab/test results, encounter notes, upcoming appointments, etc.  Non-urgent messages can be sent to your provider as well.   To learn more about what you can do with MyChart, go to [email or web address].    Your next appointment:   6 month(s)  Provider:   Kristeen Miss, MD     Other Instructions

## 2022-08-28 NOTE — Assessment & Plan Note (Signed)
Etiology not entirely clear.  She likely has PVCs or PACs that are symptomatic.  They were severe enough to bring her to the emergency room recently.  Arrange 14-day ZIO XT to further evaluate.  Continue metoprolol tartrate 25 mg twice daily.  She can take an extra 12.5 to 25 mg as needed for increased palpitations.  Follow-up in 6 months or sooner if significant arrhythmia noted.

## 2022-08-28 NOTE — Progress Notes (Unsigned)
Enrolled for [employer] to mail a ZIO XT long term holter monitor to the patients address on file.   Dr. Nahser to read. 

## 2022-08-28 NOTE — Assessment & Plan Note (Signed)
Blood pressure well-controlled.  Continue amlodipine 5 mg daily, Lopressor 25 mg twice daily, triamterene/HCTZ 37.5/25 mg daily.  Recent creatinine, potassium normal.

## 2022-08-31 DIAGNOSIS — I10 Essential (primary) hypertension: Secondary | ICD-10-CM | POA: Diagnosis not present

## 2022-08-31 DIAGNOSIS — R002 Palpitations: Secondary | ICD-10-CM | POA: Diagnosis not present

## 2022-09-07 ENCOUNTER — Telehealth: Payer: Self-pay | Admitting: Cardiovascular Disease

## 2022-09-07 NOTE — Telephone Encounter (Signed)
Spoke to the pt, currently has Zio monitor for 14 days. Pt stated during her last OV when APP suggested wearing the heart monitor she mentioned   excessive sweating. Pt stated the patch falls off everyday, she has placed additional tape over the patch and it was unsuccessful. Pt stated she did call the 800 for advise and the patch still falls of multiple times of the day. Pt wanted to inform APP she will be mailing the Zio monitor this afternoon.  Will forward to APP.

## 2022-09-07 NOTE — Telephone Encounter (Signed)
Patient stated her heart monitor would not stay on and she will be sending the monitor back.

## 2022-09-07 NOTE — Telephone Encounter (Signed)
Dorise Bullion, PA-C    09/07/2022 12:56 PM

## 2022-09-15 ENCOUNTER — Ambulatory Visit (INDEPENDENT_AMBULATORY_CARE_PROVIDER_SITE_OTHER): Payer: Managed Care, Other (non HMO) | Admitting: Internal Medicine

## 2022-09-15 ENCOUNTER — Encounter: Payer: Self-pay | Admitting: Internal Medicine

## 2022-09-15 VITALS — BP 136/80 | HR 73 | Temp 98.8°F | Ht 65.0 in | Wt 257.0 lb

## 2022-09-15 DIAGNOSIS — R10811 Right upper quadrant abdominal tenderness: Secondary | ICD-10-CM | POA: Diagnosis not present

## 2022-09-15 DIAGNOSIS — R109 Unspecified abdominal pain: Secondary | ICD-10-CM | POA: Diagnosis not present

## 2022-09-15 DIAGNOSIS — R10A1 Flank pain, right side: Secondary | ICD-10-CM | POA: Insufficient documentation

## 2022-09-15 DIAGNOSIS — R1011 Right upper quadrant pain: Secondary | ICD-10-CM

## 2022-09-15 DIAGNOSIS — D72829 Elevated white blood cell count, unspecified: Secondary | ICD-10-CM | POA: Diagnosis not present

## 2022-09-15 LAB — CBC WITH DIFFERENTIAL/PLATELET
Basophils Absolute: 0.1 10*3/uL (ref 0.0–0.1)
Basophils Relative: 0.7 % (ref 0.0–3.0)
Eosinophils Absolute: 0.3 10*3/uL (ref 0.0–0.7)
Eosinophils Relative: 3 % (ref 0.0–5.0)
HCT: 37.4 % (ref 36.0–46.0)
Hemoglobin: 12.2 g/dL (ref 12.0–15.0)
Lymphocytes Relative: 34.9 % (ref 12.0–46.0)
Lymphs Abs: 3.7 10*3/uL (ref 0.7–4.0)
MCHC: 32.5 g/dL (ref 30.0–36.0)
MCV: 86.3 fl (ref 78.0–100.0)
Monocytes Absolute: 0.9 10*3/uL (ref 0.1–1.0)
Monocytes Relative: 8.3 % (ref 3.0–12.0)
Neutro Abs: 5.7 10*3/uL (ref 1.4–7.7)
Neutrophils Relative %: 53.1 % (ref 43.0–77.0)
Platelets: 284 10*3/uL (ref 150.0–400.0)
RBC: 4.34 Mil/uL (ref 3.87–5.11)
RDW: 14.2 % (ref 11.5–15.5)
WBC: 10.7 10*3/uL — ABNORMAL HIGH (ref 4.0–10.5)

## 2022-09-15 LAB — HEPATIC FUNCTION PANEL
ALT: 7 U/L (ref 0–35)
AST: 12 U/L (ref 0–37)
Albumin: 4 g/dL (ref 3.5–5.2)
Alkaline Phosphatase: 79 U/L (ref 39–117)
Bilirubin, Direct: 0.1 mg/dL (ref 0.0–0.3)
Total Bilirubin: 0.5 mg/dL (ref 0.2–1.2)
Total Protein: 7.4 g/dL (ref 6.0–8.3)

## 2022-09-15 LAB — LIPASE: Lipase: 14 U/L (ref 11.0–59.0)

## 2022-09-15 LAB — AMYLASE: Amylase: 26 U/L — ABNORMAL LOW (ref 27–131)

## 2022-09-15 NOTE — Progress Notes (Signed)
Subjective:  Patient ID: [name], female    DOB: [birth year]  Age: 57 y.o. MRN: [id number]  CC: Abdominal Pain   HPI [name] presents for f/up ---   Discussed the use of AI scribe software for clinical note transcription with the patient, who gave verbal consent to proceed.  History of Present Illness   The patient presents with a two-week history of right flank pain, described as sharp and dull, which is present at rest and exacerbated by movement. The pain is localized and does not radiate to the groin. The patient denies dysuria, hematuria, or urinary frequency, but notes some urinary urgency due to diuretic use.  In addition to the flank pain, the patient has experienced episodes of nausea without vomiting. They also report early satiety, which has been concurrent with the onset of the flank pain. There is no history of cough, chest pain, shortness of breath, rash, or swollen lymph nodes.       Outpatient Medications Prior to Visit  Medication Sig Dispense Refill   acetaminophen (TYLENOL) 325 MG tablet Take 650 mg by mouth every 6 (six) hours as needed (for pain.).     amLODipine (NORVASC) 5 MG tablet Take 1 tablet (5 mg total) by mouth daily. 90 tablet 0   famotidine (PEPCID) 40 MG tablet TAKE 1 TABLET(40 MG) BY MOUTH AT BEDTIME 90 tablet 3   metoprolol tartrate (LOPRESSOR) 25 MG tablet TAKE 1 TABLET BY MOUTH TWICE A DAY, MAY TAKE 1/2-1 EXTRA TABLET ONLY AS NEEDED FOR PALPITATIONS 195 tablet 3   omeprazole (PRILOSEC) 40 MG capsule TAKE 1 CAPSULE BY MOUTH EVERY DAY AT 2 AM 90 capsule 1   triamterene-hydrochlorothiazide (DYAZIDE) 37.5-25 MG capsule Take 1 each (1 capsule total) by mouth daily. 90 capsule 0   No facility-administered medications prior to visit.    ROS Review of Systems  Constitutional: Negative.  Negative for diaphoresis and fatigue.  HENT: Negative.    Eyes: Negative.   Respiratory: Negative.  Negative for cough, chest tightness, shortness of  breath and wheezing.   Cardiovascular:  Negative for chest pain, palpitations and leg swelling.  Gastrointestinal:  Positive for abdominal pain. Negative for blood in stool, constipation, diarrhea, nausea and vomiting.  Endocrine: Negative.   Genitourinary:  Positive for flank pain. Negative for difficulty urinating, dysuria, hematuria and pelvic pain.  Neurological: Negative.  Negative for dizziness, weakness and light-headedness.  Hematological:  Negative for adenopathy. Does not bruise/bleed easily.  Psychiatric/Behavioral: Negative.      Objective:  BP 136/80 (BP Location: Left Arm, Patient Position: Sitting, Cuff Size: Large)   Pulse 73   Temp 98.8 F (37.1 C) (Oral)   Ht 5\' 5"  (1.651 m)   Wt 257 lb (116.6 kg)   SpO2 95%   BMI 42.77 kg/m   BP Readings from Last 3 Encounters:  09/15/22 136/80  08/28/22 118/78  08/26/22 (!) 144/86    Wt Readings from Last 3 Encounters:  09/15/22 257 lb (116.6 kg)  08/28/22 255 lb 3.2 oz (115.8 kg)  08/26/22 257 lb (116.6 kg)    Physical Exam Vitals reviewed.  Constitutional:      General: She is not in acute distress.    Appearance: She is not ill-appearing, toxic-appearing or diaphoretic.  HENT:     Nose: Nose normal.     Mouth/Throat:     Mouth: Mucous membranes are moist.  Eyes:     General: No scleral icterus.    Conjunctiva/sclera: Conjunctivae normal.  Cardiovascular:  Rate and Rhythm: Normal rate and regular rhythm.     Heart sounds: No murmur heard.    No gallop.  Pulmonary:     Effort: Pulmonary effort is normal.     Breath sounds: No stridor. No wheezing, rhonchi or rales.  Abdominal:     General: Abdomen is protuberant. Bowel sounds are normal. There is no distension.     Palpations: Abdomen is soft. There is no hepatomegaly, splenomegaly or mass.     Tenderness: There is abdominal tenderness in the right upper quadrant. There is no right CVA tenderness or left CVA tenderness.     Hernia: No hernia is present.   Musculoskeletal:        General: Normal range of motion.     Cervical back: Neck supple.     Right lower leg: No edema.     Left lower leg: No edema.  Lymphadenopathy:     Cervical: No cervical adenopathy.  Skin:    General: Skin is warm and dry.  Neurological:     General: No focal deficit present.     Mental Status: She is alert. Mental status is at baseline.  Psychiatric:        Mood and Affect: Mood normal.        Behavior: Behavior normal.     Lab Results  Component Value Date   WBC 10.7 (H) 09/15/2022   HGB 12.2 09/15/2022   HCT 37.4 09/15/2022   PLT 284.0 09/15/2022   GLUCOSE 91 08/09/2022   CHOL 194 11/27/2021   TRIG 82.0 11/27/2021   HDL 56.00 11/27/2021   LDLCALC 122 (H) 11/27/2021   ALT 7 09/15/2022   AST 12 09/15/2022   NA 140 08/09/2022   K 3.7 08/09/2022   CL 106 08/09/2022   CREATININE 0.59 08/09/2022   BUN 6 08/09/2022   CO2 26 08/09/2022   TSH 1.39 11/27/2021   INR 1.0 07/06/2007   HGBA1C 6.1 11/27/2021    DG Chest 2 View  Result Date: 08/09/2022 CLINICAL DATA:  Chest pain EXAM: CHEST - 2 VIEW COMPARISON:  December 27, 2019 FINDINGS: The heart size and mediastinal contours are within normal limits. Both lungs are clear. The visualized skeletal structures are unremarkable. IMPRESSION: No active cardiopulmonary disease. Electronically Signed   By: Gerome Sam III M.D.   On: 08/09/2022 16:54   DG ABD ACUTE 2+V W 1V CHEST  Result Date: 09/17/2022 CLINICAL DATA:  right flank pain, RUQ TTP EXAM: DG ABDOMEN ACUTE WITH 1 VIEW CHEST COMPARISON:  CXR 08/09/22 FINDINGS: No pleural effusion. No pneumothorax. Normal cardiac mediastinal contours. No focal airspace opacity. No radiographically apparent displaced rib fractures. Nonobstructive bowel gas pattern. No acute osseous abnormality. Symmetric SI joints. No radiographic finding to suggest cholelithiasis or nephrolithiasis. IMPRESSION: 1. No radiographic finding to suggest cholelithiasis or nephrolithiasis. If  these are clinical concerns, further evaluation with an abdominal ultrasound is recommended. 2. Nonobstructive bowel gas pattern. 3. No focal airspace opacity. Electronically Signed   By: Lorenza Cambridge M.D.   On: 09/17/2022 09:38     Assessment & Plan:   Acute right flank pain- With the exception of a mildly elevated WBC her labs are reassuring. Plain films are normal. -     CBC with Differential/Platelet; Future -     Hepatic function panel; Future -     Urinalysis, Routine w reflex microscopic; Future -     Amylase; Future -     Lipase; Future -     DG  ABD ACUTE 2+V W 1V CHEST; Future  Right upper quadrant abdominal tenderness without rebound tenderness- Will evaluate for gallbladder disease. -     CBC with Differential/Platelet; Future -     Hepatic function panel; Future -     Urinalysis, Routine w reflex microscopic; Future -     Amylase; Future -     Lipase; Future -     DG ABD ACUTE 2+V W 1V CHEST; Future -     US ABDOMEN LIMITED RUQ (LIVER/GB); Future  Leukocytosis, unspecified type  Right upper quadrant abdominal pain     Follow-up: Return in about 3 months (around 12/16/2022).  Sanda Linger, MD

## 2022-09-15 NOTE — Patient Instructions (Signed)
Abdominal Pain, Adult  Pain in the abdomen (abdominal pain) can be caused by many things. In most cases, it gets better with no treatment or by being treated at home. But in some cases, it can be serious. Your health care provider will ask questions about your medical history and do a physical exam to try to figure out what is causing your pain. Follow these instructions at home: Medicines Take over-the-counter and prescription medicines only as told by your provider. Do not take medicines that help you poop (laxatives) unless told by your provider. General instructions Watch your condition for any changes. Drink enough fluid to keep your pee (urine) pale yellow. Contact a health care provider if: Your pain changes, gets worse, or lasts longer than expected. You have severe cramping or bloating in your abdomen, or you vomit. Your pain gets worse with meals, after eating, or with certain foods. You are constipated or have diarrhea for more than 2-3 days. You are not hungry, or you lose weight without trying. You have signs of dehydration. These may include: Dark pee, very little pee, or no pee. Cracked lips or dry mouth. Sleepiness or weakness. You have pain when you pee (urinate) or poop. Your abdominal pain wakes you up at night. You have blood in your pee. You have a fever. Get help right away if: You cannot stop vomiting. Your pain is only in one part of the abdomen. Pain on the right side could be caused by appendicitis. You have bloody or black poop (stool), or poop that looks like tar. You have trouble breathing. You have chest pain. These symptoms may be an emergency. Get help right away. Call 911. Do not wait to see if the symptoms will go away. Do not drive yourself to the hospital. This information is not intended to replace advice given to you by your health care provider. Make sure you discuss any questions you have with your health care provider. Document Revised:  12/10/2021 Document Reviewed: 12/10/2021 Elsevier Patient Education  2024 [employer] Inc[employer]  

## 2022-09-16 DIAGNOSIS — D72829 Elevated white blood cell count, unspecified: Secondary | ICD-10-CM | POA: Insufficient documentation

## 2022-09-16 LAB — URINALYSIS, ROUTINE W REFLEX MICROSCOPIC
Bilirubin Urine: NEGATIVE
Hgb urine dipstick: NEGATIVE
Ketones, ur: NEGATIVE
Leukocytes,Ua: NEGATIVE
Nitrite: NEGATIVE
RBC / HPF: NONE SEEN (ref 0–?)
Specific Gravity, Urine: <=1.005 — AB (ref 1.000–1.030)
Total Protein, Urine: NEGATIVE
Urine Glucose: NEGATIVE
Urobilinogen, UA: 0.2 (ref 0.0–1.0)
pH: 6.5 (ref 5.0–8.0)

## 2022-09-17 ENCOUNTER — Ambulatory Visit (INDEPENDENT_AMBULATORY_CARE_PROVIDER_SITE_OTHER): Payer: Managed Care, Other (non HMO)

## 2022-09-17 DIAGNOSIS — R109 Unspecified abdominal pain: Secondary | ICD-10-CM

## 2022-09-17 DIAGNOSIS — R1011 Right upper quadrant pain: Secondary | ICD-10-CM | POA: Insufficient documentation

## 2022-09-17 DIAGNOSIS — R10811 Right upper quadrant abdominal tenderness: Secondary | ICD-10-CM

## 2022-09-21 ENCOUNTER — Encounter: Payer: Self-pay | Admitting: Physician Assistant

## 2022-09-25 ENCOUNTER — Encounter: Payer: Self-pay | Admitting: Internal Medicine

## 2022-09-25 ENCOUNTER — Ambulatory Visit
Admission: RE | Admit: 2022-09-25 | Discharge: 2022-09-25 | Disposition: A | Discharge status: Home / Self Care | Payer: Managed Care, Other (non HMO) | Source: Ambulatory Visit | Attending: Internal Medicine | Admitting: Internal Medicine

## 2022-09-25 DIAGNOSIS — R10811 Right upper quadrant abdominal tenderness: Secondary | ICD-10-CM

## 2022-09-29 ENCOUNTER — Other Ambulatory Visit: Payer: Self-pay | Admitting: Internal Medicine

## 2022-09-29 DIAGNOSIS — D72829 Elevated white blood cell count, unspecified: Secondary | ICD-10-CM

## 2022-09-29 DIAGNOSIS — K760 Fatty (change of) liver, not elsewhere classified: Secondary | ICD-10-CM

## 2022-09-29 DIAGNOSIS — R109 Unspecified abdominal pain: Secondary | ICD-10-CM

## 2022-09-29 DIAGNOSIS — R10811 Right upper quadrant abdominal tenderness: Secondary | ICD-10-CM

## 2022-10-07 ENCOUNTER — Ambulatory Visit
Admission: RE | Admit: 2022-10-07 | Discharge: 2022-10-07 | Discharge status: Home / Self Care | Payer: Managed Care, Other (non HMO) | Source: Ambulatory Visit | Attending: Internal Medicine | Admitting: Internal Medicine

## 2022-10-07 DIAGNOSIS — R10811 Right upper quadrant abdominal tenderness: Secondary | ICD-10-CM

## 2022-10-07 DIAGNOSIS — D72829 Elevated white blood cell count, unspecified: Secondary | ICD-10-CM

## 2022-10-07 DIAGNOSIS — K760 Fatty (change of) liver, not elsewhere classified: Secondary | ICD-10-CM

## 2022-10-07 DIAGNOSIS — R109 Unspecified abdominal pain: Secondary | ICD-10-CM

## 2022-10-07 MED ORDER — IOPAMIDOL (ISOVUE-300) INJECTION 61%
100.0000 mL | Freq: Once | INTRAVENOUS | Status: AC | PRN
  Administered 2022-10-07: 100 mL via INTRAVENOUS

## 2022-10-22 ENCOUNTER — Encounter (INDEPENDENT_AMBULATORY_CARE_PROVIDER_SITE_OTHER): Payer: Self-pay

## 2022-11-26 ENCOUNTER — Ambulatory Visit: Payer: Managed Care, Other (non HMO) | Admitting: Internal Medicine

## 2022-11-26 ENCOUNTER — Encounter: Payer: Self-pay | Admitting: Internal Medicine

## 2022-11-26 VITALS — BP 140/82 | HR 63 | Temp 99.2°F | Resp 16 | Ht 65.0 in | Wt 249.0 lb

## 2022-11-26 DIAGNOSIS — R509 Fever, unspecified: Secondary | ICD-10-CM

## 2022-11-26 DIAGNOSIS — I10 Essential (primary) hypertension: Secondary | ICD-10-CM | POA: Diagnosis not present

## 2022-11-26 DIAGNOSIS — R7303 Prediabetes: Secondary | ICD-10-CM

## 2022-11-26 DIAGNOSIS — Z0001 Encounter for general adult medical examination with abnormal findings: Secondary | ICD-10-CM

## 2022-11-26 LAB — CBC WITH DIFFERENTIAL/PLATELET
Basophils Absolute: 0.1 10*3/uL (ref 0.0–0.1)
Basophils Relative: 1 % (ref 0.0–3.0)
Eosinophils Absolute: 0.5 10*3/uL (ref 0.0–0.7)
Eosinophils Relative: 3.9 % (ref 0.0–5.0)
HCT: 39.4 % (ref 36.0–46.0)
Hemoglobin: 12.6 g/dL (ref 12.0–15.0)
Lymphocytes Relative: 36 % (ref 12.0–46.0)
Lymphs Abs: 4.4 10*3/uL — ABNORMAL HIGH (ref 0.7–4.0)
MCHC: 31.9 g/dL (ref 30.0–36.0)
MCV: 86.9 fl (ref 78.0–100.0)
Monocytes Absolute: 1 10*3/uL (ref 0.1–1.0)
Monocytes Relative: 8.2 % (ref 3.0–12.0)
Neutro Abs: 6.3 10*3/uL (ref 1.4–7.7)
Neutrophils Relative %: 50.9 % (ref 43.0–77.0)
Platelets: 307 10*3/uL (ref 150.0–400.0)
RBC: 4.53 Mil/uL (ref 3.87–5.11)
RDW: 14.1 % (ref 11.5–15.5)
WBC: 12.3 10*3/uL — ABNORMAL HIGH (ref 4.0–10.5)

## 2022-11-26 LAB — BASIC METABOLIC PANEL
BUN: 11 mg/dL (ref 6–23)
CO2: 28 mEq/L (ref 19–32)
Calcium: 9.9 mg/dL (ref 8.4–10.5)
Chloride: 102 mEq/L (ref 96–112)
Creatinine, Ser: 0.77 mg/dL (ref 0.40–1.20)
GFR: 85.06 mL/min (ref >60.00)
Glucose, Bld: 95 mg/dL (ref 70–99)
Potassium: 4.1 mEq/L (ref 3.5–5.1)
Sodium: 141 mEq/L (ref 135–145)

## 2022-11-26 LAB — HEMOGLOBIN A1C: Hgb A1c MFr Bld: 5.9 % (ref 4.6–6.5)

## 2022-11-26 LAB — POC COVID19 BINAXNOW: SARS Coronavirus 2 Ag: NEGATIVE

## 2022-11-26 NOTE — Progress Notes (Unsigned)
Subjective:  Patient ID: [name], female    DOB: 29-Apr-1965  Age: 57 y.o. MRN: [id number]  CC: Annual Exam and Hypertension   HPI Kirby Brull presents for a CPX and f/up ----  Discussed the use of AI scribe software for clinical note transcription with the patient, who gave verbal consent to proceed.  History of Present Illness   The patient, with a history of cardiovascular disease, reports an episode of lightheadedness and leg pain that occurred at work. The episode was associated with leg swelling, but resolved after rest and taking their blood pressure medication. The patient also reports experiencing palpitations, a symptom they have had in the past.  The patient denies any chest pain, shortness of breath, or dizziness. They also deny any current pain, but express interest in muscle relaxers for their leg. The patient is a nonsmoker and nondrinker, and does not take cholesterol medication.  The patient works at a location that recently had a COVID-19 outbreak, but they report being tested nightly and have not had any symptoms of fever, chills, cough, or sore throat. They have not been vaccinated for COVID-19.       Outpatient Medications Prior to Visit  Medication Sig Dispense Refill   acetaminophen (TYLENOL) 325 MG tablet Take 650 mg by mouth every 6 (six) hours as needed (for pain.).     amLODipine (NORVASC) 5 MG tablet Take 1 tablet (5 mg total) by mouth daily. 90 tablet 0   famotidine (PEPCID) 40 MG tablet TAKE 1 TABLET(40 MG) BY MOUTH AT BEDTIME 90 tablet 3   metoprolol tartrate (LOPRESSOR) 25 MG tablet TAKE 1 TABLET BY MOUTH TWICE A DAY, MAY TAKE 1/2-1 EXTRA TABLET ONLY AS NEEDED FOR PALPITATIONS 195 tablet 3   omeprazole (PRILOSEC) 40 MG capsule TAKE 1 CAPSULE BY MOUTH EVERY DAY AT 2 AM 90 capsule 1   triamterene-hydrochlorothiazide (DYAZIDE) 37.5-25 MG capsule Take 1 each (1 capsule total) by mouth daily. 90 capsule 0   No facility-administered medications  prior to visit.    ROS Review of Systems  Objective:  BP (!) 140/82 (BP Location: Left Arm, Patient Position: Sitting, Cuff Size: Large)   Pulse 63   Temp 99.2 F (37.3 C) (Oral)   Resp 16   Ht 5\' 5"  (1.651 m)   Wt 249 lb (112.9 kg)   SpO2 93%   BMI 41.44 kg/m   BP Readings from Last 3 Encounters:  11/26/22 (!) 140/82  09/15/22 136/80  08/28/22 118/78    Wt Readings from Last 3 Encounters:  11/26/22 249 lb (112.9 kg)  09/15/22 257 lb (116.6 kg)  08/28/22 255 lb 3.2 oz (115.8 kg)    Physical Exam  Lab Results  Component Value Date   WBC 12.3 (H) 11/26/2022   HGB 12.6 11/26/2022   HCT 39.4 11/26/2022   PLT 307.0 11/26/2022   GLUCOSE 95 11/26/2022   CHOL 194 11/27/2021   TRIG 82.0 11/27/2021   HDL 56.00 11/27/2021   LDLCALC 122 (H) 11/27/2021   ALT 7 09/15/2022   AST 12 09/15/2022   NA 141 11/26/2022   K 4.1 11/26/2022   CL 102 11/26/2022   CREATININE 0.77 11/26/2022   BUN 11 11/26/2022   CO2 28 11/26/2022   TSH 1.39 11/27/2021   INR 1.0 07/06/2007   HGBA1C 5.9 11/26/2022    CT ABDOMEN PELVIS W CONTRAST  Result Date: 10/10/2022 CLINICAL DATA:  Right flank and abdominal pain for 2 months. EXAM: CT ABDOMEN AND PELVIS WITH CONTRAST  TECHNIQUE: Multidetector CT imaging of the abdomen and pelvis was performed using the standard protocol following bolus administration of intravenous contrast. RADIATION DOSE REDUCTION: This exam was performed according to the departmental dose-optimization program which includes automated exposure control, adjustment of the mA and/or kV according to patient size and/or use of iterative reconstruction technique. CONTRAST:  ISOVUE-300 IOPAMIDOL (ISOVUE-300) INJECTION 61% COMPARISON:  11/29/2016 FINDINGS: Lower Chest: No acute findings. Hepatobiliary: No suspicious hepatic masses identified. Gallbladder is unremarkable. No evidence of biliary ductal dilatation. Pancreas:  No mass or inflammatory changes. Spleen: Within normal limits  in size and appearance. Adrenals/Urinary Tract: No suspicious masses identified. No evidence of ureteral calculi or hydronephrosis. Stomach/Bowel: No evidence of obstruction, inflammatory process or abnormal fluid collections. Vascular/Lymphatic: No pathologically enlarged lymph nodes. No acute vascular findings. Reproductive: Prior hysterectomy noted. Adnexal regions are unremarkable in appearance. Other:  None. Musculoskeletal:  No suspicious bone lesions identified. IMPRESSION: No acute findings or other significant abnormality. Electronically Signed   By: Danae Orleans M.D.   On: 10/10/2022 16:05    Assessment & Plan:  Prediabetes -     Hemoglobin A1c; Future -     Basic metabolic panel; Future  Fever, low grade -     CBC with Differential/Platelet; Future -     POC COVID-19 BinaxNow  Essential hypertension, benign -     Basic metabolic panel; Future -     CBC with Differential/Platelet; Future  Encounter for general adult medical examination with abnormal findings     Follow-up: Return in about 6 months (around 05/26/2023).  Sanda Linger, MD

## 2022-11-26 NOTE — Patient Instructions (Signed)

## 2022-12-09 ENCOUNTER — Other Ambulatory Visit: Payer: Self-pay | Admitting: Internal Medicine

## 2022-12-09 DIAGNOSIS — K2100 Gastro-esophageal reflux disease with esophagitis, without bleeding: Secondary | ICD-10-CM

## 2023-01-21 ENCOUNTER — Other Ambulatory Visit: Payer: Self-pay | Admitting: Internal Medicine

## 2023-01-21 ENCOUNTER — Ambulatory Visit
Admission: EM | Admit: 2023-01-21 | Discharge: 2023-01-21 | Disposition: A | Discharge status: Home / Self Care | Payer: Managed Care, Other (non HMO) | Attending: Emergency Medicine | Admitting: Emergency Medicine

## 2023-01-21 DIAGNOSIS — H60391 Other infective otitis externa, right ear: Secondary | ICD-10-CM

## 2023-01-21 DIAGNOSIS — H6011 Cellulitis of right external ear: Secondary | ICD-10-CM | POA: Diagnosis not present

## 2023-01-21 DIAGNOSIS — H60331 Swimmer's ear, right ear: Secondary | ICD-10-CM

## 2023-01-21 DIAGNOSIS — I10 Essential (primary) hypertension: Secondary | ICD-10-CM

## 2023-01-21 MED ORDER — CEPHALEXIN 500 MG PO CAPS: 1000.0000 mg | ORAL_CAPSULE | Freq: Two times a day (BID) | ORAL | 0 refills | Status: AC

## 2023-01-21 MED ORDER — NEOMYCIN-POLYMYXIN-HC 3.5-10000-1 OT SUSP: 4.0000 [drp] | Freq: Four times a day (QID) | OTIC | 0 refills | Status: AC

## 2023-01-21 NOTE — ED Triage Notes (Signed)
"  My right ear is hurting". "It has been off/on for a few wks but now I am seeing/feeling a lot of stuff inside with pain". No fever.

## 2023-01-21 NOTE — ED Provider Notes (Signed)
HPI  SUBJECTIVE:  [name] is a 57 y.o. female who presents with 1 week of sharp, constant right ear pain and soreness, with white/hard otorrhea, crusting.  No change in hearing, fevers, recent swimming, foreign body insertion, trauma to the ear.  No popping or clicking of the jaw.  No tinnitus or vertigo.  No facial rash, headache, numbness or tingling, pain in the V1 dermatome.  She tried cleaning it with Q-tips, avocado, castor oil, washing it out, and scraping it with a fingernail.  No alleviating factors.  Symptoms worse at night.  She gets her ear wet when she showers.  She has a past medical history of hypertension, vertigo.  No history of MRSA, diabetes, immunocompromise.  PCP: [facility]  Past Medical History:  Diagnosis Date   Arthritis    COVID-19 06/2018   Esophageal stenosis    Esophagitis    GERD (gastroesophageal reflux disease)    Hiatal hernia    Hypertension    Iron deficiency anemia    Migraines    Morbid obesity (HCC)    Palpitations 05/12/2011   Monitor 08/2022: NSR, rare nonsustained SVT (longest 7 beats), rare PVCs/PACs      Sleep apnea         Past Surgical History:  Procedure Laterality Date   ABDOMINAL HYSTERECTOMY     ABDOMINAL HYSTERECTOMY  2003   CARDIOVASCULAR STRESS TEST  07/25/2007   Negative stress echo; ef 55-60%   ESOPHAGEAL MANOMETRY  03/07/2012   Procedure: ESOPHAGEAL MANOMETRY (EM);  Surgeon: Mardella Layman, MD;  Location: [facility];  Service: Endoscopy;  Laterality: N/A;   ESOPHAGEAL MANOMETRY N/A 06/08/2019   Procedure: ESOPHAGEAL MANOMETRY (EM);  Surgeon: Iva Boop, MD;  Location: [facility];  Service: Endoscopy;  Laterality: N/A;   ESOPHAGOGASTRODUODENOSCOPY     ESOPHAGOGASTRODUODENOSCOPY     ESOPHAGOGASTRODUODENOSCOPY (EGD) WITH PROPOFOL N/A 06/08/2019   Procedure: ESOPHAGOGASTRODUODENOSCOPY (EGD) WITH PROPOFOL;  Surgeon: Iva Boop, MD;  Location: [facility];  Service: Endoscopy;  Laterality: N/A;   KNEE  ARTHROSCOPY  01/2011   left   KNEE SURGERY      Family History  Problem Relation Age of Onset   Cervical cancer Mother    Hypertension Sister    Diabetes Sister    Heart disease Neg Hx    Cancer Neg Hx    Alcohol abuse Neg Hx    Early death Neg Hx    Hearing loss Neg Hx    Hyperlipidemia Neg Hx    Kidney disease Neg Hx    Stroke Neg Hx     Social History   Tobacco Use   Smoking status: Never   Smokeless tobacco: Never  Vaping Use   Vaping status: Never Used  Substance Use Topics   Alcohol use: No   Drug use: No    No current facility-administered medications for this encounter.  Current Outpatient Medications:    cephALEXin (KEFLEX) 500 MG capsule, Take 2 capsules (1,000 mg total) by mouth 2 (two) times daily for 5 days., Disp: 20 capsule, Rfl: 0   metoprolol tartrate (LOPRESSOR) 25 MG tablet, TAKE 1 TABLET BY MOUTH TWICE A DAY, MAY TAKE 1/2-1 EXTRA TABLET ONLY AS NEEDED FOR PALPITATIONS, Disp: 195 tablet, Rfl: 3   neomycin-polymyxin-hydrocortisone (CORTISPORIN) 3.5-10000-1 OTIC suspension, Place 4 drops into the right ear 4 (four) times daily for 7 days., Disp: 7.5 mL, Rfl: 0   triamterene-hydrochlorothiazide (DYAZIDE) 37.5-25 MG capsule, Take 1 each (1 capsule total) by mouth daily., Disp: 90  capsule, Rfl: 0   acetaminophen (TYLENOL) 325 MG tablet, Take 650 mg by mouth every 6 (six) hours as needed (for pain.)., Disp: , Rfl:    amLODipine (NORVASC) 5 MG tablet, TAKE 1 TABLET(5 MG) BY MOUTH DAILY, Disp: 90 tablet, Rfl: 0   famotidine (PEPCID) 40 MG tablet, TAKE 1 TABLET(40 MG) BY MOUTH AT BEDTIME, Disp: 90 tablet, Rfl: 3   omeprazole (PRILOSEC) 40 MG capsule, TAKE 1 CAPSULE BY MOUTH EVERY DAY AT 2 AM, Disp: 90 capsule, Rfl: 1  Allergies  Allergen Reactions   Oxycodone Shortness Of Breath   Pneumococcal Vaccines Itching and Swelling    Swelling caused by prevnar (06/22/2016).     ROS  As noted in HPI.   Physical Exam  BP 108/74 (BP Location: Left Arm)    Pulse 61   Temp 98.8 F (37.1 C) (Oral)   Resp 20   Ht 5\' 5"  (1.651 m)   Wt 112.5 kg   SpO2 97%   BMI 41.27 kg/m   Constitutional: Well developed, well nourished, no acute distress Eyes:  EOMI, conjunctiva normal bilaterally HENT: Normocephalic, atraumatic,mucus membranes moist Right external ear normal.  Yellow crusting, nontender erythema at the entrance of the EAC.  White debris in the EAC.  EAC normal, skin intact.  TM normal, intact.  Pain with traction on pinna.  No tenderness with palpation of tragus or mastoid.  Neck: No cervical lymphadenopathy Respiratory: Normal inspiratory effort Cardiovascular: Normal rate GI: nondistended skin: No facial rash or tenderness, skin intact Musculoskeletal: no deformities Neurologic: Alert & oriented x 3, no focal neuro deficits Psychiatric: Speech and behavior appropriate   ED Course   Medications - No data to display  No orders of the defined types were placed in this encounter.   No results found for this or any previous visit (from the past 24 hour(s)). No results found.  ED Clinical Impression  1. Other infective acute otitis externa of right ear   2. Cellulitis of right ear      ED Assessment/Plan    Presents with a right otitis externa versus cellulitis at the external ear canal.  Will send home with Cortisporin and Keflex 1000 mg p.o. twice daily for 5 days.  Work note for today.  Follow-up with PCP as needed  Discussed MDM, treatment plan, and plan for follow-up with patient. patient agrees with plan.   Meds ordered this encounter  Medications   neomycin-polymyxin-hydrocortisone (CORTISPORIN) 3.5-10000-1 OTIC suspension    Sig: Place 4 drops into the right ear 4 (four) times daily for 7 days.    Dispense:  7.5 mL    Refill:  0   cephALEXin (KEFLEX) 500 MG capsule    Sig: Take 2 capsules (1,000 mg total) by mouth 2 (two) times daily for 5 days.    Dispense:  20 capsule    Refill:  0      *This clinic  note was created using Scientist, clinical (histocompatibility and immunogenetics). Therefore, there may be occasional mistakes despite careful proofreading.  ?    Domenick Gong, MD 01/22/23 [phone]

## 2023-01-21 NOTE — Discharge Instructions (Addendum)
The eardrops are for infection of the ear canal and the antibiotics are for a cellulitis at the entrance of the ear.  This should cover both entities.  Finish them, even if you feel better.

## 2023-02-13 ENCOUNTER — Encounter: Payer: Self-pay | Admitting: Cardiovascular Disease

## 2023-02-13 NOTE — Progress Notes (Unsigned)
Cardiology Office Note:    Date:  02/15/2023   ID:  [name], DOB Nov 12, 1965, MRN [id number]  PCP:  [name], MD  Cardiologist:  Kristeen Miss, MD   Referring MD: [name], MD   Chief Complaint  Patient presents with   Hypertension        Chest Pain    Previous notes from 06/09/17    [name] is a 57 y.o. female with a hx of hypertension.  She has been recently having some atypical episodes of chest pain. She was recently seen in the emergency room for atypical chest pain.  A CT scan ruled out pulmonary embolus.  She also ruled out for myocardial infarction.  She describes pain as a aching and heartburn-like sensation underneath her breastbone.   Was started on Dexilant by her primary MD and the pains have resolved.   She has been under lots of stress.  Was found to have a low vit D level, was not getting nearly enough sleep. ( 10 hours of sleep in a week)   Still has palpitations .  Is sleeping a bit better.   Works 3rd shift at [employer]   Has been getting some exercise.   Is limited by some issues with her right foot.  Was up to 5 miles a day   Feb. 1, 2022:  [name] is seen today follow up of her HTN, atypical CP, Has been having more heart palps She had stopped her Bystolic due to insurance not paying for the med. She tried Carvedilol but she felt worse  She has tried metoprolol in the past -  Is now on Indapamide  ( is not on a potassium replacement )  Having leg cramps    June 24, 2020 [name] is seen today for follow up of her HTN, palpitations No CP  Is exercising ,  No cp or dyspnea with that.  September 02, 2021: [name] seen today for follow-up of her hypertension and palpitations.  She has been out of her spironolactone for about a week.  Her blood pressure is slightly elevated because of that.  Try to watch her salt intake She walks 30 minutes a day during her break at work    Dec. 9, 2024 [name] is seen today  for follow up of her HTN, palpitations Palpitations are not very troublesome Takes an extra metoprolol on rare occasion Getting some regular exercise - walks  Wt is 246 lbs  We discussed some dietary changes.     Past Medical History:  Diagnosis Date   Arthritis    COVID-19 06/2018   Esophageal stenosis    Esophagitis    GERD (gastroesophageal reflux disease)    Hiatal hernia    Hypertension    Iron deficiency anemia    Migraines    Morbid obesity (HCC)    Palpitations 05/12/2011   Monitor 08/2022: NSR, rare nonsustained SVT (longest 7 beats), rare PVCs/PACs      Sleep apnea         Past Surgical History:  Procedure Laterality Date   ABDOMINAL HYSTERECTOMY     ABDOMINAL HYSTERECTOMY  2003   CARDIOVASCULAR STRESS TEST  07/25/2007   Negative stress echo; ef 55-60%   ESOPHAGEAL MANOMETRY  03/07/2012   Procedure: ESOPHAGEAL MANOMETRY (EM);  Surgeon: Mardella Layman, MD;  Location: [facility];  Service: Endoscopy;  Laterality: N/A;   ESOPHAGEAL MANOMETRY N/A 06/08/2019   Procedure: ESOPHAGEAL MANOMETRY (EM);  Surgeon: Iva Boop,  MD;  Location: [facility];  Service: Endoscopy;  Laterality: N/A;   ESOPHAGOGASTRODUODENOSCOPY     ESOPHAGOGASTRODUODENOSCOPY     ESOPHAGOGASTRODUODENOSCOPY (EGD) WITH PROPOFOL N/A 06/08/2019   Procedure: ESOPHAGOGASTRODUODENOSCOPY (EGD) WITH PROPOFOL;  Surgeon: Iva Boop, MD;  Location: [facility];  Service: Endoscopy;  Laterality: N/A;   KNEE ARTHROSCOPY  01/2011   left   KNEE SURGERY      Current Medications: Current Meds  Medication Sig   acetaminophen (TYLENOL) 325 MG tablet Take 650 mg by mouth every 6 (six) hours as needed (for pain.).   amLODipine (NORVASC) 5 MG tablet TAKE 1 TABLET(5 MG) BY MOUTH DAILY   famotidine (PEPCID) 40 MG tablet TAKE 1 TABLET(40 MG) BY MOUTH AT BEDTIME   metoprolol tartrate (LOPRESSOR) 25 MG tablet TAKE 1 TABLET BY MOUTH TWICE A DAY, MAY TAKE 1/2-1 EXTRA TABLET ONLY AS NEEDED FOR PALPITATIONS    nitrofurantoin, macrocrystal-monohydrate, (MACROBID) 100 MG capsule Take 100 mg by mouth daily as needed (for UTIs).   omeprazole (PRILOSEC) 40 MG capsule TAKE 1 CAPSULE BY MOUTH EVERY DAY AT 2 AM   triamterene-hydrochlorothiazide (DYAZIDE) 37.5-25 MG capsule Take 1 each (1 capsule total) by mouth daily.     Allergies:   Oxycodone and Pneumococcal vaccines   Social History   Socioeconomic History   Marital status: Married    Spouse name: Not on file   Number of children: 0   Years of education: Not on file   Highest education level: Not on file  Occupational History   Occupation: CNA    Employer: [employer]  Tobacco Use   Smoking status: Never   Smokeless tobacco: Never  Vaping Use   Vaping status: Never Used  Substance and Sexual Activity   Alcohol use: No   Drug use: No   Sexual activity: Not Currently    Birth control/protection: Surgical  Other Topics Concern   Not on file  Social History Narrative   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 There is no history of Heart disease, Cancer, Alcohol abuse, Early death, Hearing loss, Hyperlipidemia, Kidney disease, or Stroke.  ROS:   Please see the history of present illness.     All other systems reviewed and are negative.  EKGs/Labs/Other Studies Reviewed:    The following studies were reviewed today:   EKG:        Recent Labs: 09/15/2022: ALT 7 11/26/2022: BUN 11; Creatinine, Ser 0.77; Hemoglobin 12.6; Platelets 307.0; Potassium 4.1; Sodium 141  Recent Lipid Panel    Component Value Date/Time   CHOL  194 11/27/2021 0825   TRIG 82.0 11/27/2021 0825   HDL 56.00 11/27/2021 0825   CHOLHDL 3 11/27/2021 0825   VLDL 16.4 11/27/2021 0825   LDLCALC 122 (H) 11/27/2021 0825    Physical Exam:     Physical Exam: Blood pressure 112/72, pulse 65, height 5\' 5"  (1.651 m), weight 246 lb 12.8 oz (111.9 kg), SpO2 97%.       GEN:  Well nourished, well developed in no acute distress HEENT: Normal NECK:  No JVD; No carotid bruits LYMPHATICS: No lymphadenopathy CARDIAC: RRR , no murmurs, rubs, gallops RESPIRATORY:  Clear to auscultation without rales, wheezing or rhonchi  ABDOMEN: Soft, non-tender, non-distended MUSCULOSKELETAL:  No edema; No deformity  SKIN: Warm and dry NEUROLOGIC:  Alert and oriented x 3    ASSESSMENT:    1. Essential hypertension, benign   2. Palpitations      PLAN:      1.   Palpitations:     palpitations are well controlled.   Cont metoprolol ,  she takes additional metoprolol as nneded.    3.  Hypertension:    BP is well controlled.   Have her follow up in a 1 year with Dr Carolan Clines     Medication Adjustments/Labs and Tests Ordered: Current medicines are reviewed at length with the patient today.  Concerns regarding medicines are outlined above.  No orders of the defined types were placed in this encounter.  No orders of the defined types were placed in this encounter.    Signed, Kristeen Miss, MD  02/15/2023 10:34 AM    [facility]

## 2023-02-15 ENCOUNTER — Ambulatory Visit: Payer: Managed Care, Other (non HMO) | Attending: Cardiovascular Disease | Admitting: Cardiovascular Disease

## 2023-02-15 ENCOUNTER — Encounter: Payer: Self-pay | Admitting: Cardiovascular Disease

## 2023-02-15 VITALS — BP 112/72 | HR 65 | Ht 65.0 in | Wt 246.8 lb

## 2023-02-15 DIAGNOSIS — R002 Palpitations: Secondary | ICD-10-CM

## 2023-02-15 DIAGNOSIS — I10 Essential (primary) hypertension: Secondary | ICD-10-CM

## 2023-02-15 NOTE — Patient Instructions (Signed)
Medication Instructions:  The current medical regimen is effective;  continue present plan and medications.  *If you need a refill on your cardiac medications before your next appointment, please call your pharmacy*   Follow-Up: At [facility], you and your health needs are our priority.  As part of our continuing mission to provide you with exceptional heart care, we have created designated Provider Care Teams.  These Care Teams include your primary Cardiologist (physician) and Advanced Practice Providers (APPs -  Physician Assistants and Nurse Practitioners) who all work together to provide you with the care you need, when you need it.  We recommend signing up for the patient portal called "MyChart".  Sign up information is provided on this After Visit Summary.  MyChart is used to connect with patients for Virtual Visits (Telemedicine).  Patients are able to view lab/test results, encounter notes, upcoming appointments, etc.  Non-urgent messages can be sent to your provider as well.   To learn more about what you can do with MyChart, go to [email or web address].    Your next appointment:   1 year(s)  Provider:   Dr Carolan Clines

## 2023-03-04 ENCOUNTER — Ambulatory Visit: Payer: Managed Care, Other (non HMO)

## 2023-03-04 ENCOUNTER — Ambulatory Visit (INDEPENDENT_AMBULATORY_CARE_PROVIDER_SITE_OTHER): Payer: Managed Care, Other (non HMO) | Admitting: Internal Medicine

## 2023-03-04 ENCOUNTER — Encounter: Payer: Self-pay | Admitting: Internal Medicine

## 2023-03-04 VITALS — BP 146/100 | HR 70 | Temp 98.4°F | Ht 65.0 in | Wt 246.0 lb

## 2023-03-04 DIAGNOSIS — I10 Essential (primary) hypertension: Secondary | ICD-10-CM | POA: Diagnosis not present

## 2023-03-04 DIAGNOSIS — M549 Dorsalgia, unspecified: Secondary | ICD-10-CM | POA: Diagnosis not present

## 2023-03-04 DIAGNOSIS — R7303 Prediabetes: Secondary | ICD-10-CM

## 2023-03-04 DIAGNOSIS — R079 Chest pain, unspecified: Secondary | ICD-10-CM

## 2023-03-04 DIAGNOSIS — E559 Vitamin D deficiency, unspecified: Secondary | ICD-10-CM

## 2023-03-04 MED ORDER — CYCLOBENZAPRINE HCL 5 MG PO TABS: 5.0000 mg | ORAL_TABLET | Freq: Three times a day (TID) | ORAL | 1 refills | Status: DC | PRN

## 2023-03-04 MED ORDER — TRAMADOL HCL 50 MG PO TABS
50.0000 mg | ORAL_TABLET | Freq: Four times a day (QID) | ORAL | 0 refills | Status: DC | PRN
Start: 2023-03-04 — End: 2023-09-10

## 2023-03-04 NOTE — Patient Instructions (Signed)
Please take all new medication as prescribed - the pain medication, and muscle relaxer as needed  You are given the work note today  Please continue all other medications as before, and refills have been done if requested.  Please have the pharmacy call with any other refills you may need.  Please keep your appointments with your specialists as you may have planned  Please go to the XRAY Department in the first floor for the x-ray testing  You will be contacted by phone if any changes need to be made immediately.  Otherwise, you will receive a letter about your results with an explanation, but please check with MyChart first.

## 2023-03-04 NOTE — Progress Notes (Signed)
Patient ID: [name], female   DOB: August 08, 1965, 57 y.o.   MRN: [id number]        Chief Complaint: follow up right upper back and chest wall pain, htn, preDM       HPI:  [name] is a 57 y.o. 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 Pt denies chest pain, increased sob or doe, wheezing, orthopnea, PND, increased LE swelling, palpitations, dizziness or syncope.   Pt denies polydipsia, polyuria, or new focal neuro s/s.         Wt Readings from Last 3 Encounters:  03/04/23 246 lb (111.6 kg)  02/15/23 246 lb 12.8 oz (111.9 kg)  01/21/23 248 lb (112.5 kg)   BP Readings from Last 3 Encounters:  03/04/23 (!) 146/100  02/15/23 112/72  01/21/23 108/74         Past Medical History:  Diagnosis Date   Arthritis    COVID-19 06/2018   Esophageal stenosis    Esophagitis    GERD (gastroesophageal reflux disease)    Hiatal hernia    Hypertension    Iron deficiency anemia    Migraines    Morbid obesity (HCC)    Palpitations 05/12/2011   Monitor 08/2022: NSR, rare nonsustained SVT (longest 7 beats), rare PVCs/PACs      Sleep apnea        Past Surgical History:  Procedure Laterality Date   ABDOMINAL HYSTERECTOMY     ABDOMINAL HYSTERECTOMY  2003   CARDIOVASCULAR STRESS TEST  07/25/2007   Negative stress echo; ef 55-60%   ESOPHAGEAL MANOMETRY  03/07/2012   Procedure: ESOPHAGEAL MANOMETRY (EM);  Surgeon: Mardella Layman, MD;  Location: [facility];  Service: Endoscopy;  Laterality: N/A;   ESOPHAGEAL MANOMETRY N/A 06/08/2019   Procedure: ESOPHAGEAL MANOMETRY (EM);  Surgeon: Iva Boop, MD;  Location: [facility];  Service: Endoscopy;  Laterality: N/A;   ESOPHAGOGASTRODUODENOSCOPY      ESOPHAGOGASTRODUODENOSCOPY     ESOPHAGOGASTRODUODENOSCOPY (EGD) WITH PROPOFOL N/A 06/08/2019   Procedure: ESOPHAGOGASTRODUODENOSCOPY (EGD) WITH PROPOFOL;  Surgeon: Iva Boop, MD;  Location: [facility];  Service: Endoscopy;  Laterality: N/A;   KNEE ARTHROSCOPY  01/2011   left   KNEE SURGERY      reports that she has never smoked. She has never used smokeless tobacco. She reports that she does not drink alcohol and does not use drugs. family history includes Cervical cancer in her mother; Diabetes in her sister; Hypertension in her sister. Allergies  Allergen Reactions   Oxycodone Shortness Of Breath   Pneumococcal Vaccines Itching and Swelling    Swelling caused by prevnar (06/22/2016).   Current Outpatient Medications on File Prior to Visit  Medication Sig Dispense Refill   acetaminophen (TYLENOL) 325 MG tablet Take 650 mg by mouth every 6 (six) hours as needed (for pain.).     amLODipine (NORVASC) 5 MG tablet TAKE 1 TABLET(5 MG) BY MOUTH DAILY 90 tablet 0   famotidine (PEPCID) 40 MG tablet TAKE 1 TABLET(40 MG) BY MOUTH AT BEDTIME 90 tablet 3   metoprolol tartrate (LOPRESSOR) 25 MG tablet TAKE 1 TABLET BY MOUTH TWICE A DAY, MAY TAKE 1/2-1 EXTRA TABLET ONLY AS NEEDED FOR PALPITATIONS 195 tablet  3   nitrofurantoin, macrocrystal-monohydrate, (MACROBID) 100 MG capsule Take 100 mg by mouth daily as needed (for UTIs).     omeprazole (PRILOSEC) 40 MG capsule TAKE 1 CAPSULE BY MOUTH EVERY DAY AT 2 AM 90 capsule 1   triamterene-hydrochlorothiazide (DYAZIDE) 37.5-25 MG capsule Take 1 each (1 capsule total) by mouth daily. 90 capsule 0   No current facility-administered medications on file prior to visit.        ROS:  All others reviewed and negative.  Objective        PE:  BP (!) 146/100 (BP Location: Left Arm, Patient Position: Sitting, Cuff Size: Normal)   Pulse 70   Temp 98.4 F (36.9 C) (Oral)   Ht 5\' 5"  (1.651 m)   Wt 246 lb (111.6 kg)   SpO2 98%   BMI 40.94 kg/m                  Constitutional: Pt appears in NAD               HENT: Head: NCAT.                Right Ear: External ear normal.                 Left Ear: External ear normal.                Eyes: . Pupils are equal, round, and reactive to light. Conjunctivae and EOM are normal               Nose: without d/c or deformity               Neck: Neck supple. Gross normal ROM               Cardiovascular: Normal rate and regular rhythm.                 Pulmonary/Chest: Effort normal and breath sounds without rales or wheezing.                Abd:  Soft, NT, ND, + BS, no organomegaly               Neurological: Pt is alert. At baseline orientation, motor grossly intact               Skin: Skin is warm. No rashes, no other new lesions, LE edema - none; jas tender right upper back trapezoid and right chest wall tender without sweling bruising or rash; right shoulder NT with FROM               Psychiatric: Pt behavior is normal without agitation   Micro: none  Cardiac tracings I have personally interpreted today:  none  Pertinent Radiological findings (summarize): none   Lab Results  Component Value Date   WBC 12.3 (H) 11/26/2022   HGB 12.6 11/26/2022   HCT 39.4 11/26/2022   PLT 307.0 11/26/2022   GLUCOSE 95 11/26/2022   CHOL 194 11/27/2021   TRIG 82.0 11/27/2021   HDL 56.00 11/27/2021   LDLCALC 122 (H) 11/27/2021   ALT 7 09/15/2022   AST 12 09/15/2022   NA 141 11/26/2022   K 4.1 11/26/2022   CL 102 11/26/2022   CREATININE 0.77 11/26/2022   BUN 11 11/26/2022   CO2 28 11/26/2022   TSH 1.39 11/27/2021   INR 1.0 07/06/2007   HGBA1C 5.9 11/26/2022   Assessment/Plan:  [name] is a 57 y.o. Black or African  American [2] female with  has a past medical history of Arthritis, COVID-19 (06/2018), Esophageal stenosis, Esophagitis, GERD (gastroesophageal reflux disease), Hiatal hernia, Hypertension, Iron deficiency anemia, Migraines, Morbid obesity (HCC), Palpitations (05/12/2011), and Sleep  apnea.  Essential hypertension, benign BP Readings from Last 3 Encounters:  03/04/23 (!) 146/100  02/15/23 112/72  01/21/23 108/74   Uncontrolled, likely situational due to pain, pt to continue medical treatment norvasc 5 every day, lopressor 25 bid, dyazide 37 25 every day, declines other change for now   Prediabetes Lab Results  Component Value Date   HGBA1C 5.9 11/26/2022   Stable, pt to continue current medical treatment  - diet, wt control   Vitamin D deficiency Last vitamin D Lab Results  Component Value Date   VD25OH 28.54 (L) 01/01/2020  Low, to start oral replacement   Upper back pain on right side C/w mild to mod msk strain, for tramadol prn, flexeril prn, for cxr, rib films,  to f/u any worsening symptoms or concerns  Chest pain C/w msk strain, for tx and imaging as above  Followup: Return if symptoms worsen or fail to improve.  Oliver Barre, MD 03/06/2023 9:26 PM [facility] - [facility]

## 2023-03-06 ENCOUNTER — Encounter: Payer: Self-pay | Admitting: Internal Medicine

## 2023-03-06 DIAGNOSIS — M549 Dorsalgia, unspecified: Secondary | ICD-10-CM | POA: Insufficient documentation

## 2023-03-06 DIAGNOSIS — R079 Chest pain, unspecified: Secondary | ICD-10-CM | POA: Insufficient documentation

## 2023-03-06 NOTE — Assessment & Plan Note (Signed)
Lab Results  Component Value Date   HGBA1C 5.9 11/26/2022   Stable, pt to continue current medical treatment  - diet, wt control

## 2023-03-06 NOTE — Assessment & Plan Note (Signed)
BP Readings from Last 3 Encounters:  03/04/23 (!) 146/100  02/15/23 112/72  01/21/23 108/74   Uncontrolled, likely situational due to pain, pt to continue medical treatment norvasc 5 every day, lopressor 25 bid, dyazide 37 25 every day, declines other change for now

## 2023-03-06 NOTE — Assessment & Plan Note (Signed)
C/w mild to mod msk strain, for tramadol prn, flexeril prn, for cxr, rib films,  to f/u any worsening symptoms or concerns

## 2023-03-06 NOTE — Assessment & Plan Note (Signed)
Last vitamin D Lab Results  Component Value Date   VD25OH 28.54 (L) 01/01/2020  Low, to start oral replacement

## 2023-03-06 NOTE — Assessment & Plan Note (Signed)
C/w msk strain, for tx and imaging as above

## 2023-03-26 ENCOUNTER — Inpatient Hospital Stay
Admission: RE | Admit: 2023-03-26 | Discharge: 2023-03-26 | Disposition: A | Discharge status: Home / Self Care | Payer: Managed Care, Other (non HMO) | Source: Ambulatory Visit | Attending: Internal Medicine | Admitting: Internal Medicine

## 2023-03-26 ENCOUNTER — Encounter (HOSPITAL_COMMUNITY): Payer: Self-pay | Admitting: Emergency Medicine

## 2023-03-26 ENCOUNTER — Ambulatory Visit: Payer: Self-pay

## 2023-03-26 ENCOUNTER — Emergency Department (HOSPITAL_COMMUNITY)
Admission: EM | Admit: 2023-03-26 | Discharge: 2023-03-27 | Disposition: A | Discharge status: Home / Self Care | Payer: Managed Care, Other (non HMO) | Attending: Emergency Medicine | Admitting: Emergency Medicine

## 2023-03-26 ENCOUNTER — Other Ambulatory Visit: Payer: Self-pay

## 2023-03-26 ENCOUNTER — Other Ambulatory Visit: Payer: Self-pay | Admitting: Family Medicine

## 2023-03-26 DIAGNOSIS — R220 Localized swelling, mass and lump, head: Secondary | ICD-10-CM | POA: Diagnosis present

## 2023-03-26 DIAGNOSIS — K047 Periapical abscess without sinus: Secondary | ICD-10-CM

## 2023-03-26 DIAGNOSIS — W01198A Fall on same level from slipping, tripping and stumbling with subsequent striking against other object, initial encounter: Secondary | ICD-10-CM | POA: Diagnosis not present

## 2023-03-26 DIAGNOSIS — Z23 Encounter for immunization: Secondary | ICD-10-CM | POA: Diagnosis not present

## 2023-03-26 DIAGNOSIS — W19XXXA Unspecified fall, initial encounter: Secondary | ICD-10-CM

## 2023-03-26 DIAGNOSIS — M25561 Pain in right knee: Secondary | ICD-10-CM

## 2023-03-26 NOTE — ED Triage Notes (Signed)
Patient c/o upper lip swelling. Patient report she fell and hit her face in a pavement yesterday. Patient report swelling is a lot worse after she wake up from her nap tonight. Patient report she was seen by the Dr for workers comp yesterday but didn't do anything .

## 2023-03-27 ENCOUNTER — Emergency Department (HOSPITAL_COMMUNITY): Payer: Managed Care, Other (non HMO)

## 2023-03-27 ENCOUNTER — Ambulatory Visit: Payer: Managed Care, Other (non HMO)

## 2023-03-27 LAB — CBC WITH DIFFERENTIAL/PLATELET
Abs Immature Granulocytes: 0.06 10*3/uL (ref 0.00–0.07)
Basophils Absolute: 0.1 10*3/uL (ref 0.0–0.1)
Basophils Relative: 1 %
Eosinophils Absolute: 0.3 10*3/uL (ref 0.0–0.5)
Eosinophils Relative: 2 %
HCT: 41.7 % (ref 36.0–46.0)
Hemoglobin: 12.9 g/dL (ref 12.0–15.0)
Immature Granulocytes: 0 %
Lymphocytes Relative: 19 %
Lymphs Abs: 2.7 10*3/uL (ref 0.7–4.0)
MCH: 27.7 pg (ref 26.0–34.0)
MCHC: 30.9 g/dL (ref 30.0–36.0)
MCV: 89.5 fL (ref 80.0–100.0)
Monocytes Absolute: 1.1 10*3/uL — ABNORMAL HIGH (ref 0.1–1.0)
Monocytes Relative: 8 %
Neutro Abs: 10.4 10*3/uL — ABNORMAL HIGH (ref 1.7–7.7)
Neutrophils Relative %: 70 %
Platelets: 281 10*3/uL (ref 150–400)
RBC: 4.66 MIL/uL (ref 3.87–5.11)
RDW: 13.9 % (ref 11.5–15.5)
WBC: 14.6 10*3/uL — ABNORMAL HIGH (ref 4.0–10.5)
nRBC: 0 % (ref 0.0–0.2)

## 2023-03-27 LAB — BASIC METABOLIC PANEL
Anion gap: 10 (ref 5–15)
BUN: 10 mg/dL (ref 6–20)
CO2: 23 mmol/L (ref 22–32)
Calcium: 8.6 mg/dL — ABNORMAL LOW (ref 8.9–10.3)
Chloride: 102 mmol/L (ref 98–111)
Creatinine, Ser: 0.47 mg/dL (ref 0.44–1.00)
GFR, Estimated: >60 mL/min (ref >60)
Glucose, Bld: 97 mg/dL (ref 70–99)
Potassium: 3.5 mmol/L (ref 3.5–5.1)
Sodium: 135 mmol/L (ref 135–145)

## 2023-03-27 LAB — I-STAT CG4 LACTIC ACID, ED: Lactic Acid, Venous: 0.9 mmol/L (ref 0.5–1.9)

## 2023-03-27 MED ORDER — TETANUS-DIPHTH-ACELL PERTUSSIS 5-2.5-18.5 LF-MCG/0.5 IM SUSY
0.5000 mL | PREFILLED_SYRINGE | Freq: Once | INTRAMUSCULAR | Status: AC
  Administered 2023-03-27: 0.5 mL via INTRAMUSCULAR
  Filled 2023-03-27: qty 0.5

## 2023-03-27 MED ORDER — ACETAMINOPHEN 500 MG PO TABS
1000.0000 mg | ORAL_TABLET | Freq: Once | ORAL | Status: AC
  Administered 2023-03-27: 1000 mg via ORAL
  Filled 2023-03-27: qty 2

## 2023-03-27 MED ORDER — CLINDAMYCIN PHOSPHATE 900 MG/50ML IV SOLN
900.0000 mg | Freq: Once | INTRAVENOUS | Status: AC
  Administered 2023-03-27: 900 mg via INTRAVENOUS
  Filled 2023-03-27: qty 50

## 2023-03-27 MED ORDER — CLINDAMYCIN HCL 150 MG PO CAPS: 300.0000 mg | ORAL_CAPSULE | Freq: Three times a day (TID) | ORAL | 0 refills | Status: DC

## 2023-03-27 NOTE — Discharge Instructions (Signed)
CLINICAL DATA:  Upper lip swelling after falling and hitting face on  pavement yesterday. Swelling as the worse tonight.    EXAM:  CT HEAD WITHOUT CONTRAST    CT MAXILLOFACIAL WITHOUT CONTRAST    CT CERVICAL SPINE WITHOUT CONTRAST    TECHNIQUE:  Multidetector CT imaging of the head, cervical spine, and  maxillofacial structures were performed using the standard protocol  without intravenous contrast. Multiplanar CT image reconstructions  of the cervical spine and maxillofacial structures were also  generated.    RADIATION DOSE REDUCTION: This exam was performed according to the  departmental dose-optimization program which includes automated  exposure control, adjustment of the mA and/or kV according to  patient size and/or use of iterative reconstruction technique.    COMPARISON:  MRI head 05/05/2017    FINDINGS:  CT HEAD FINDINGS    Brain: No evidence of acute infarction, hemorrhage, hydrocephalus,  extra-axial collection or mass lesion/mass effect.    Vascular: No hyperdense vessel or unexpected calcification.    Skull: Normal. Negative for fracture or focal lesion.    Other: None.    CT MAXILLOFACIAL FINDINGS    Osseous: No fracture or mandibular dislocation. Periapical lucency  about the right central maxillary incisor (series 12/image 34).    Orbits: Negative. No traumatic or inflammatory finding.    Sinuses: Clear.    Soft tissues: Soft tissue swelling about the upper lip.    CT CERVICAL SPINE FINDINGS    Alignment: No evidence of traumatic listhesis.    Skull base and vertebrae: No acute fracture. No primary bone lesion  or focal pathologic process.    Soft tissues and spinal canal: No prevertebral fluid or swelling. No  visible canal hematoma.    Disc levels: Multilevel spondylosis, disc space height loss, and  degenerative endplate changes. No severe spinal canal narrowing.    Upper chest: No acute abnormality.    Other: None.    IMPRESSION:   1. No acute intracranial abnormality.  2. No acute facial bone fracture. Soft tissue swelling about the  upper lip.  3. Periapical lucency about the right central maxillary incisor is  favored chronic. Correlate for loosening.  4. No acute fracture in the cervical spine.      Electronically Signed    By: Minerva Fester M.D.    On: 03/27/2023 01:15

## 2023-03-27 NOTE — ED Provider Notes (Signed)
[facility]-EMERGENCY DEPT [facility] Emergency Department Provider Note MRN:  [id number]  Arrival date & time: 03/27/23     Chief Complaint   Oral Swelling   History of Present Illness   [name] is a 58 y.o. year-old female presents to the ED with chief complaint of facial swelling.  She states that she tripped and fell yesterday and hit her face on the asphalt.  She did not lose consciousness.  She sustained an abrasion to her upper lip.  She states that her teeth hurt, but do not feel loose.  She states that today her swelling in her face has significantly worsened.  She denies fevers or chills, but is noted to have low-grade fever in triage of 100.5 degrees.  She states that she had her right knee evaluated yesterday, but it is improving.  She denies any other injuries.  Denies being anticoagulated.  Denies loss of consciousness.Marland Kitchen  History provided by patient.   Review of Systems  Pertinent positive and negative review of systems noted in HPI.    Physical Exam   Vitals:   03/27/23 0015 03/27/23 0137  BP: (!) 165/114 (!) 142/64  Pulse: 83 79  Resp: 18 18  Temp: (!) 100.5 F (38.1 C) 99.7 F (37.6 C)  SpO2: 100% 100%    CONSTITUTIONAL:  non toxic-appearing, NAD NEURO:  Alert and oriented x 3, CN 3-12 grossly intact EYES:  eyes equal and reactive ENT/NECK:  Supple, no stridor, possible chipped tooth, no loose teeth CARDIO:  normal rate, regular rhythm, appears well-perfused  PULM:  No respiratory distress, CTAB GI/GU:  non-distended,  MSK/SPINE:  No gross deformities, no edema, moves all extremities  SKIN:  abrasions to upper lip, swelling as pictured        *Additional and/or pertinent findings included in MDM below  Diagnostic and Interventional Summary    EKG Interpretation Date/Time:    Ventricular Rate:    PR Interval:    QRS Duration:    QT Interval:    QTC Calculation:   R Axis:      Text Interpretation:         Labs Reviewed  CBC  WITH DIFFERENTIAL/PLATELET - Abnormal; Notable for the following components:      Result Value   WBC 14.6 (*)    Neutro Abs 10.4 (*)    Monocytes Absolute 1.1 (*)    All other components within normal limits  BASIC METABOLIC PANEL - Abnormal; Notable for the following components:   Calcium 8.6 (*)    All other components within normal limits  I-STAT CG4 LACTIC ACID, ED  I-STAT CG4 LACTIC ACID, ED    CT Maxillofacial Wo Contrast  Final Result    CT HEAD WO CONTRAST ( )  Final Result    CT Cervical Spine Wo Contrast  Final Result      Medications  clindamycin (CLEOCIN) IVPB 900 mg (900 mg Intravenous New Bag/Given 03/27/23 0204)  Tdap (BOOSTRIX) injection 0.5 mL (0.5 mLs Intramuscular Given 03/27/23 0040)  acetaminophen (TYLENOL) tablet 1,000 mg (1,000 mg Oral Given 03/27/23 0039)     Procedures  /  Critical Care Procedures  ED Course and Medical Decision Making  I have reviewed the triage vital signs, the nursing notes, and pertinent available records from the EMR.  Social Determinants Affecting Complexity of Care: Patient has no clinically significant social determinants affecting this chief complaint..   ED Course:    Medical Decision Making Patient here after ground-level fall yesterday.  She has  an abrasion on her upper lip.  She noticed facial swelling and came to the emergency department for evaluation.  In triage she is noted to have low-grade fever of 100.5 degrees.  I do have some suspicion for spreading infection.  CT maxillofacial shows soft tissue swelling, but no apparent abscess.  There is periapical lucency around the right central maxillary incisor, but patient does not have any loose teeth.  Will have her follow-up with dentistry.  Patient given dose of clindamycin.  Will Rx the same.  Patient to follow-up with dental.  Amount and/or Complexity of Data Reviewed Labs: ordered. Radiology: ordered.  Risk OTC drugs. Prescription drug management.          Consultants: No consultations were needed in caring for this patient.   Treatment and Plan: I considered admission due to patient's initial presentation, but after considering the examination and diagnostic results, patient will not require admission and can be discharged with outpatient follow-up.    Final Clinical Impressions(s) / ED Diagnoses     ICD-10-CM   1. Lip swelling  R22.0     2. Dental infection  K04.7     3. Fall, initial encounter  W19.Lorne Skeens       ED Discharge Orders          Ordered    clindamycin (CLEOCIN) 150 MG capsule  3 times daily        03/27/23 0156              Discharge Instructions Discussed with and Provided to Patient:     Discharge Instructions       CLINICAL DATA:  Upper lip swelling after falling and hitting face on  pavement yesterday. Swelling as the worse tonight.    EXAM:  CT HEAD WITHOUT CONTRAST    CT MAXILLOFACIAL WITHOUT CONTRAST    CT CERVICAL SPINE WITHOUT CONTRAST    TECHNIQUE:  Multidetector CT imaging of the head, cervical spine, and  maxillofacial structures were performed using the standard protocol  without intravenous contrast. Multiplanar CT image reconstructions  of the cervical spine and maxillofacial structures were also  generated.    RADIATION DOSE REDUCTION: This exam was performed according to the  departmental dose-optimization program which includes automated  exposure control, adjustment of the mA and/or kV according to  patient size and/or use of iterative reconstruction technique.    COMPARISON:  MRI head 05/05/2017    FINDINGS:  CT HEAD FINDINGS    Brain: No evidence of acute infarction, hemorrhage, hydrocephalus,  extra-axial collection or mass lesion/mass effect.    Vascular: No hyperdense vessel or unexpected calcification.    Skull: Normal. Negative for fracture or focal lesion.    Other: None.    CT MAXILLOFACIAL FINDINGS    Osseous: No fracture or mandibular  dislocation. Periapical lucency  about the right central maxillary incisor (series 12/image 34).    Orbits: Negative. No traumatic or inflammatory finding.    Sinuses: Clear.    Soft tissues: Soft tissue swelling about the upper lip.    CT CERVICAL SPINE FINDINGS    Alignment: No evidence of traumatic listhesis.    Skull base and vertebrae: No acute fracture. No primary bone lesion  or focal pathologic process.    Soft tissues and spinal canal: No prevertebral fluid or swelling. No  visible canal hematoma.    Disc levels: Multilevel spondylosis, disc space height loss, and  degenerative endplate changes. No severe spinal canal narrowing.    Upper chest: No  acute abnormality.    Other: None.    IMPRESSION:  1. No acute intracranial abnormality.  2. No acute facial bone fracture. Soft tissue swelling about the  upper lip.  3. Periapical lucency about the right central maxillary incisor is  favored chronic. Correlate for loosening.  4. No acute fracture in the cervical spine.      Electronically Signed    By: Minerva Fester M.D.    On: 03/27/2023 01:15         Roxy Horseman, PA-C 03/27/23 0206    Palumbo, April, MD 03/27/23 1610

## 2023-03-28 ENCOUNTER — Other Ambulatory Visit: Payer: Self-pay

## 2023-03-28 ENCOUNTER — Encounter (HOSPITAL_COMMUNITY): Payer: Self-pay | Admitting: *Deleted

## 2023-03-28 ENCOUNTER — Emergency Department (HOSPITAL_COMMUNITY)
Admission: EM | Admit: 2023-03-28 | Discharge: 2023-03-28 | Disposition: A | Discharge status: Home / Self Care | Payer: Worker's Compensation | Attending: Emergency Medicine | Admitting: Emergency Medicine

## 2023-03-28 DIAGNOSIS — R220 Localized swelling, mass and lump, head: Secondary | ICD-10-CM

## 2023-03-28 DIAGNOSIS — W19XXXD Unspecified fall, subsequent encounter: Secondary | ICD-10-CM | POA: Insufficient documentation

## 2023-03-28 DIAGNOSIS — S00531D Contusion of lip, subsequent encounter: Secondary | ICD-10-CM | POA: Insufficient documentation

## 2023-03-28 MED ORDER — HYDROCODONE-ACETAMINOPHEN 5-325 MG PO TABS: 2.0000 | ORAL_TABLET | ORAL | 0 refills | Status: DC | PRN

## 2023-03-28 MED ORDER — HYDROCODONE-ACETAMINOPHEN 5-325 MG PO TABS
1.0000 | ORAL_TABLET | Freq: Once | ORAL | Status: AC
  Administered 2023-03-28: 1 via ORAL
  Filled 2023-03-28: qty 1

## 2023-03-28 NOTE — Discharge Instructions (Signed)
Return for any problem.  Continue to take antibiotics as previously prescribed.  Use Norco as prescribed for pain.

## 2023-03-28 NOTE — ED Provider Notes (Signed)
[place] EMERGENCY DEPARTMENT AT [facility] Provider Note   CSN: [id number] Arrival date & time: 03/28/23  1742     History  Chief Complaint  Patient presents with   Facial Swelling    Refujia Cellucci is a 58 y.o. female.  58 year old female with prior medical history as detailed below presents for evaluation.  Patient was seen Friday evening after a fall with facial injury.  Patient with significant swelling to the upper lip secondary contusion from the fall.  Patient was given a course of antibiotics out of concern for possible concurrent dental infection.  Patient returns tonight complaining of continued pain.  This is poorly controlled with OTC medications at home.  The history is provided by the patient and medical records.       Home Medications Prior to Admission medications   Medication Sig Start Date End Date Taking? Authorizing Provider  acetaminophen (TYLENOL) 325 MG tablet Take 650 mg by mouth every 6 (six) hours as needed (for pain.).    [provider]  amLODipine (NORVASC) 5 MG tablet TAKE 1 TABLET(5 MG) BY MOUTH DAILY 01/21/23   [name], MD  clindamycin (CLEOCIN) 150 MG capsule Take 2 capsules (300 mg total) by mouth 3 (three) times daily. May dispense as 150mg  capsules 03/27/23   Roxy Horseman, PA-C  cyclobenzaprine (FLEXERIL) 5 MG tablet Take 1 tablet (5 mg total) by mouth 3 (three) times daily as needed. 03/04/23   Corwin Levins, MD  famotidine (PEPCID) 40 MG tablet TAKE 1 TABLET(40 MG) BY MOUTH AT BEDTIME 09/12/20   Iva Boop, MD  metoprolol tartrate (LOPRESSOR) 25 MG tablet TAKE 1 TABLET BY MOUTH TWICE A DAY, MAY TAKE 1/2-1 EXTRA TABLET ONLY AS NEEDED FOR PALPITATIONS 08/28/22   Tereso Newcomer T, PA-C  nitrofurantoin, macrocrystal-monohydrate, (MACROBID) 100 MG capsule Take 100 mg by mouth daily as needed (for UTIs). 12/10/22   [provider]  omeprazole (PRILOSEC) 40 MG capsule TAKE 1 CAPSULE BY MOUTH EVERY DAY AT  2 AM 12/09/22   [name], MD  traMADol (ULTRAM) 50 MG tablet Take 1 tablet (50 mg total) by mouth every 6 (six) hours as needed. 03/04/23   Corwin Levins, MD  triamterene-hydrochlorothiazide (DYAZIDE) 37.5-25 MG capsule Take 1 each (1 capsule total) by mouth daily. 08/26/22   [name], MD      Allergies    Oxycodone and Pneumococcal vaccines    Review of Systems   Review of Systems  All other systems reviewed and are negative.   Physical Exam Updated Vital Signs BP (!) 163/91 (BP Location: Right Arm)   Pulse 85   Temp 98.8 F (37.1 C) (Oral)   Resp 16   Wt 111.6 kg   SpO2 97%   BMI 40.94 kg/m  Physical Exam Vitals and nursing note reviewed.  Constitutional:      General: She is not in acute distress.    Appearance: Normal appearance. She is well-developed.  HENT:     Head: Normocephalic.     Comments: Notable upper lip edema consistent with contusion.   Eyes:     Conjunctiva/sclera: Conjunctivae normal.     Pupils: Pupils are equal, round, and reactive to light.  Cardiovascular:     Rate and Rhythm: Normal rate and regular rhythm.     Heart sounds: Normal heart sounds.  Pulmonary:     Effort: Pulmonary effort is normal. No respiratory distress.     Breath sounds: Normal breath sounds.  Abdominal:     General: There is no distension.     Palpations: Abdomen is soft.     Tenderness: There is no abdominal tenderness.  Musculoskeletal:        General: No deformity. Normal range of motion.     Cervical back: Normal range of motion and neck supple.  Skin:    General: Skin is warm and dry.  Neurological:     General: No focal deficit present.     Mental Status: She is alert and oriented to person, place, and time.     ED Results / Procedures / Treatments   Labs (all labs ordered are listed, but only abnormal results are displayed) Labs Reviewed - No data to display  EKG None  Radiology CT Maxillofacial Wo Contrast Result Date:  03/27/2023 CLINICAL DATA:  Upper lip swelling after falling and hitting face on pavement yesterday. Swelling as the worse tonight. EXAM: CT HEAD WITHOUT CONTRAST CT MAXILLOFACIAL WITHOUT CONTRAST CT CERVICAL SPINE WITHOUT CONTRAST TECHNIQUE: Multidetector CT imaging of the head, cervical spine, and maxillofacial structures were performed using the standard protocol without intravenous contrast. Multiplanar CT image reconstructions of the cervical spine and maxillofacial structures were also generated. RADIATION DOSE REDUCTION: This exam was performed according to the departmental dose-optimization program which includes automated exposure control, adjustment of the mA and/or kV according to patient size and/or use of iterative reconstruction technique. COMPARISON:  MRI head 05/05/2017 FINDINGS: CT HEAD FINDINGS Brain: No evidence of acute infarction, hemorrhage, hydrocephalus, extra-axial collection or mass lesion/mass effect. Vascular: No hyperdense vessel or unexpected calcification. Skull: Normal. Negative for fracture or focal lesion. Other: None. CT MAXILLOFACIAL FINDINGS Osseous: No fracture or mandibular dislocation. Periapical lucency about the right central maxillary incisor (series 12/image 34). Orbits: Negative. No traumatic or inflammatory finding. Sinuses: Clear. Soft tissues: Soft tissue swelling about the upper lip. CT CERVICAL SPINE FINDINGS Alignment: No evidence of traumatic listhesis. Skull base and vertebrae: No acute fracture. No primary bone lesion or focal pathologic process. Soft tissues and spinal canal: No prevertebral fluid or swelling. No visible canal hematoma. Disc levels: Multilevel spondylosis, disc space height loss, and degenerative endplate changes. No severe spinal canal narrowing. Upper chest: No acute abnormality. Other: None. IMPRESSION: 1. No acute intracranial abnormality. 2. No acute facial bone fracture. Soft tissue swelling about the upper lip. 3. Periapical lucency about  the right central maxillary incisor is favored chronic. Correlate for loosening. 4. No acute fracture in the cervical spine. Electronically Signed   By: Minerva Fester M.D.   On: 03/27/2023 01:15   CT HEAD WO CONTRAST ( ) Result Date: 03/27/2023 CLINICAL DATA:  Upper lip swelling after falling and hitting face on pavement yesterday. Swelling as the worse tonight. EXAM: CT HEAD WITHOUT CONTRAST CT MAXILLOFACIAL WITHOUT CONTRAST CT CERVICAL SPINE WITHOUT CONTRAST TECHNIQUE: Multidetector CT imaging of the head, cervical spine, and maxillofacial structures were performed using the standard protocol without intravenous contrast. Multiplanar CT image reconstructions of the cervical spine and maxillofacial structures were also generated. RADIATION DOSE REDUCTION: This exam was performed according to the departmental dose-optimization program which includes automated exposure control, adjustment of the mA and/or kV according to patient size and/or use of iterative reconstruction technique. COMPARISON:  MRI head 05/05/2017 FINDINGS: CT HEAD FINDINGS Brain: No evidence of acute infarction, hemorrhage, hydrocephalus, extra-axial collection or mass lesion/mass effect. Vascular: No hyperdense vessel or unexpected calcification. Skull: Normal. Negative for fracture or focal lesion. Other: None. CT MAXILLOFACIAL FINDINGS Osseous: No fracture or mandibular dislocation.  Periapical lucency about the right central maxillary incisor (series 12/image 34). Orbits: Negative. No traumatic or inflammatory finding. Sinuses: Clear. Soft tissues: Soft tissue swelling about the upper lip. CT CERVICAL SPINE FINDINGS Alignment: No evidence of traumatic listhesis. Skull base and vertebrae: No acute fracture. No primary bone lesion or focal pathologic process. Soft tissues and spinal canal: No prevertebral fluid or swelling. No visible canal hematoma. Disc levels: Multilevel spondylosis, disc space height loss, and degenerative endplate  changes. No severe spinal canal narrowing. Upper chest: No acute abnormality. Other: None. IMPRESSION: 1. No acute intracranial abnormality. 2. No acute facial bone fracture. Soft tissue swelling about the upper lip. 3. Periapical lucency about the right central maxillary incisor is favored chronic. Correlate for loosening. 4. No acute fracture in the cervical spine. Electronically Signed   By: Minerva Fester M.D.   On: 03/27/2023 01:15   CT Cervical Spine Wo Contrast Result Date: 03/27/2023 CLINICAL DATA:  Upper lip swelling after falling and hitting face on pavement yesterday. Swelling as the worse tonight. EXAM: CT HEAD WITHOUT CONTRAST CT MAXILLOFACIAL WITHOUT CONTRAST CT CERVICAL SPINE WITHOUT CONTRAST TECHNIQUE: Multidetector CT imaging of the head, cervical spine, and maxillofacial structures were performed using the standard protocol without intravenous contrast. Multiplanar CT image reconstructions of the cervical spine and maxillofacial structures were also generated. RADIATION DOSE REDUCTION: This exam was performed according to the departmental dose-optimization program which includes automated exposure control, adjustment of the mA and/or kV according to patient size and/or use of iterative reconstruction technique. COMPARISON:  MRI head 05/05/2017 FINDINGS: CT HEAD FINDINGS Brain: No evidence of acute infarction, hemorrhage, hydrocephalus, extra-axial collection or mass lesion/mass effect. Vascular: No hyperdense vessel or unexpected calcification. Skull: Normal. Negative for fracture or focal lesion. Other: None. CT MAXILLOFACIAL FINDINGS Osseous: No fracture or mandibular dislocation. Periapical lucency about the right central maxillary incisor (series 12/image 34). Orbits: Negative. No traumatic or inflammatory finding. Sinuses: Clear. Soft tissues: Soft tissue swelling about the upper lip. CT CERVICAL SPINE FINDINGS Alignment: No evidence of traumatic listhesis. Skull base and vertebrae: No  acute fracture. No primary bone lesion or focal pathologic process. Soft tissues and spinal canal: No prevertebral fluid or swelling. No visible canal hematoma. Disc levels: Multilevel spondylosis, disc space height loss, and degenerative endplate changes. No severe spinal canal narrowing. Upper chest: No acute abnormality. Other: None. IMPRESSION: 1. No acute intracranial abnormality. 2. No acute facial bone fracture. Soft tissue swelling about the upper lip. 3. Periapical lucency about the right central maxillary incisor is favored chronic. Correlate for loosening. 4. No acute fracture in the cervical spine. Electronically Signed   By: Minerva Fester M.D.   On: 03/27/2023 01:15    Procedures Procedures    Medications Ordered in ED Medications  HYDROcodone-acetaminophen (NORCO/VICODIN) 5-325 MG per tablet 1 tablet (1 tablet Oral Given 03/28/23 1912)    ED Course/ Medical Decision Making/ A&P                                 Medical Decision Making Risk Prescription drug management.    Medical Screen Complete  This patient presented to the ED with complaint of facial contusion.  This complaint involves an extensive number of treatment options. The initial differential diagnosis includes, but is not limited to, contusion, infection, etc.  This presentation is: Acute, Self-Limited, Previously Undiagnosed, Uncertain Prognosis, and Complicated  Patient presents for reevaluation of upper lip contusion after fall 2  days prior.  Patient primarily returns secondary to continued pain that is poorly controlled with OTC medications alone.  Exam is entirely consistent with contusion to the upper lip after fall.  Patient is compliant with previously prescribed antibiotics.  Patient feels much improved after 1 dose of Norco here in the ED.  She desires discharge home.  She does understand need for close outpatient follow-up.  Strict return precautions given and understood.  Additional  history obtained:  External records from outside sources obtained and reviewed including prior ED visits and prior Inpatient records.    Medicines ordered:  I ordered medication including Norco for pain Reevaluation of the patient after these medicines showed that the patient: improved   Problem List / ED Course:  Contusion to the upper lip   Reevaluation:  After the interventions noted above, I reevaluated the patient and found that they have: improved   Disposition:  After consideration of the diagnostic results and the patients response to treatment, I feel that the patent would benefit from close outpatient follow-up.          Final Clinical Impression(s) / ED Diagnoses Final diagnoses:  Facial swelling    Rx / DC Orders ED Discharge Orders     None         Wynetta Fines, MD 03/28/23 2338

## 2023-03-28 NOTE — ED Triage Notes (Signed)
Returns for subsequent visit for facial pain/swelling not getting better, and actually getting worse. Seen here Friday for initial visit after fall. Given Tdap, and initiated clindamycin (monotherapy) for traumatic facial/ upper lip/ dental infection. Advil taken today at 1300. Rates pain 10/10. Pending appointment with oral surgeon. Alert, NAD, calm, interactive. Denies sx other than pain and swelling. Tolerating PO fluids (soup).

## 2023-04-21 ENCOUNTER — Other Ambulatory Visit: Payer: Self-pay | Admitting: Internal Medicine

## 2023-04-21 DIAGNOSIS — I10 Essential (primary) hypertension: Secondary | ICD-10-CM

## 2023-05-31 ENCOUNTER — Encounter: Payer: Self-pay | Admitting: Internal Medicine

## 2023-05-31 ENCOUNTER — Ambulatory Visit (INDEPENDENT_AMBULATORY_CARE_PROVIDER_SITE_OTHER): Admitting: Internal Medicine

## 2023-05-31 VITALS — BP 134/84 | HR 64 | Temp 98.5°F | Ht 65.0 in | Wt 238.0 lb

## 2023-05-31 DIAGNOSIS — E559 Vitamin D deficiency, unspecified: Secondary | ICD-10-CM | POA: Diagnosis not present

## 2023-05-31 DIAGNOSIS — R519 Headache, unspecified: Secondary | ICD-10-CM

## 2023-05-31 DIAGNOSIS — E538 Deficiency of other specified B group vitamins: Secondary | ICD-10-CM

## 2023-05-31 DIAGNOSIS — I10 Essential (primary) hypertension: Secondary | ICD-10-CM | POA: Diagnosis not present

## 2023-05-31 LAB — CBC WITH DIFFERENTIAL/PLATELET
Basophils Absolute: 0.1 10*3/uL (ref 0.0–0.1)
Basophils Relative: 1.9 % (ref 0.0–3.0)
Eosinophils Absolute: 0.2 10*3/uL (ref 0.0–0.7)
Eosinophils Relative: 2.2 % (ref 0.0–5.0)
HCT: 38 % (ref 36.0–46.0)
Hemoglobin: 12.5 g/dL (ref 12.0–15.0)
Lymphocytes Relative: 34.2 % (ref 12.0–46.0)
Lymphs Abs: 2.7 10*3/uL (ref 0.7–4.0)
MCHC: 33 g/dL (ref 30.0–36.0)
MCV: 86.7 fl (ref 78.0–100.0)
Monocytes Absolute: 0.6 10*3/uL (ref 0.1–1.0)
Monocytes Relative: 8.2 % (ref 3.0–12.0)
Neutro Abs: 4.2 10*3/uL (ref 1.4–7.7)
Neutrophils Relative %: 53.5 % (ref 43.0–77.0)
Platelets: 282 10*3/uL (ref 150.0–400.0)
RBC: 4.38 Mil/uL (ref 3.87–5.11)
RDW: 13.8 % (ref 11.5–15.5)
WBC: 7.8 10*3/uL (ref 4.0–10.5)

## 2023-05-31 LAB — COMPREHENSIVE METABOLIC PANEL
ALT: 8 U/L (ref 0–35)
AST: 16 U/L (ref 0–37)
Albumin: 4 g/dL (ref 3.5–5.2)
Alkaline Phosphatase: 78 U/L (ref 39–117)
BUN: 11 mg/dL (ref 6–23)
CO2: 28 meq/L (ref 19–32)
Calcium: 9.3 mg/dL (ref 8.4–10.5)
Chloride: 106 meq/L (ref 96–112)
Creatinine, Ser: 0.6 mg/dL (ref 0.40–1.20)
GFR: 98.62 mL/min (ref >60.00)
Glucose, Bld: 88 mg/dL (ref 70–99)
Potassium: 4.4 meq/L (ref 3.5–5.1)
Sodium: 141 meq/L (ref 135–145)
Total Bilirubin: 0.5 mg/dL (ref 0.2–1.2)
Total Protein: 7.4 g/dL (ref 6.0–8.3)

## 2023-05-31 LAB — VITAMIN B12: Vitamin B-12: 222 pg/mL (ref 211–911)

## 2023-05-31 LAB — VITAMIN D 25 HYDROXY (VIT D DEFICIENCY, FRACTURES): VITD: 16.46 ng/mL — ABNORMAL LOW (ref 30.00–100.00)

## 2023-05-31 NOTE — Patient Instructions (Addendum)
      Blood work was ordered.       Medications changes include :   start taking vitamin B12 and vitamin D daily.       Return if symptoms worsen or fail to improve.

## 2023-05-31 NOTE — Progress Notes (Addendum)
 Subjective:    Patient ID: [name], female    DOB: March 19, 1965, 58 y.o.   MRN: [id number]      HPI Berna is here for  Chief Complaint  Patient presents with   Headache    Headache better today; Last headache was yesterday with some vision blurriness noted; No dizziness    Last time she started having headaches like this her B12 and vitamin d levels were low.  She wanted to come in to make sure that was not the case.   Headaches started last week.  Headaches were just on left side.  She does have a history of migraines, but these are not similar to her migraines.  Last night she had a headache and took advil and it resolved.  No headache today.   She has had some associated blurry and fogginess.  She also states occasional lightheadedness/dizziness.    No cold symptoms or allergy symptoms.      Medications and allergies reviewed with patient and updated if appropriate.  Current Outpatient Medications on File Prior to Visit  Medication Sig Dispense Refill   acetaminophen (TYLENOL) 325 MG tablet Take 650 mg by mouth every 6 (six) hours as needed (for pain.).     amLODipine (NORVASC) 5 MG tablet TAKE 1 TABLET(5 MG) BY MOUTH DAILY 90 tablet 0   clindamycin (CLEOCIN) 150 MG capsule Take 2 capsules (300 mg total) by mouth 3 (three) times daily. May dispense as 150mg  capsules 60 capsule 0   cyclobenzaprine (FLEXERIL) 5 MG tablet Take 1 tablet (5 mg total) by mouth 3 (three) times daily as needed. 40 tablet 1   famotidine (PEPCID) 40 MG tablet TAKE 1 TABLET(40 MG) BY MOUTH AT BEDTIME 90 tablet 3   HYDROcodone-acetaminophen (NORCO/VICODIN) 5-325 MG tablet Take 2 tablets by mouth every 4 (four) hours as needed. 10 tablet 0   metoprolol tartrate (LOPRESSOR) 25 MG tablet TAKE 1 TABLET BY MOUTH TWICE A DAY, MAY TAKE 1/2-1 EXTRA TABLET ONLY AS NEEDED FOR PALPITATIONS 195 tablet 3   nitrofurantoin, macrocrystal-monohydrate, (MACROBID) 100 MG capsule Take 100 mg by mouth daily as  needed (for UTIs).     omeprazole (PRILOSEC) 40 MG capsule TAKE 1 CAPSULE BY MOUTH EVERY DAY AT 2 AM 90 capsule 1   traMADol (ULTRAM) 50 MG tablet Take 1 tablet (50 mg total) by mouth every 6 (six) hours as needed. 30 tablet 0   triamterene-hydrochlorothiazide (DYAZIDE) 37.5-25 MG capsule Take 1 each (1 capsule total) by mouth daily. 90 capsule 0   No current facility-administered medications on file prior to visit.    Review of Systems  Constitutional:  Negative for chills and fever.  HENT:  Negative for congestion, ear pain, sinus pain and sore throat.   Eyes:  Positive for visual disturbance.  Respiratory:  Negative for shortness of breath.   Cardiovascular:  Positive for palpitations (occ - chronic w/o change). Negative for chest pain.  Gastrointestinal:  Negative for nausea.  Neurological:  Positive for dizziness (occ), light-headedness (occ) and headaches. Negative for weakness and numbness.       Fogginess        Objective:   Vitals:   05/31/23 0945  BP: 134/84  Pulse: 64  Temp: 98.5 F (36.9 C)  SpO2: 99%   BP Readings from Last 3 Encounters:  05/31/23 134/84  03/28/23 (!) 129/91  03/27/23 (!) 142/64   Wt Readings from Last 3 Encounters:  05/31/23 238 lb (108 kg)  03/28/23 246 lb (  111.6 kg)  03/26/23 246 lb 0.5 oz (111.6 kg)   Body mass index is 39.61 kg/m.    Physical Exam Constitutional:      General: She is not in acute distress.    Appearance: Normal appearance. She is not ill-appearing.  HENT:     Head: Normocephalic and atraumatic.     Right Ear: Tympanic membrane, ear canal and external ear normal.     Left Ear: Tympanic membrane, ear canal and external ear normal.     Mouth/Throat:     Mouth: Mucous membranes are moist.     Pharynx: No oropharyngeal exudate or posterior oropharyngeal erythema.  Eyes:     Extraocular Movements: Extraocular movements intact.     Conjunctiva/sclera: Conjunctivae normal.  Cardiovascular:     Rate and Rhythm:  Normal rate and regular rhythm.     Heart sounds: Normal heart sounds.  Pulmonary:     Effort: Pulmonary effort is normal. No respiratory distress.     Breath sounds: Normal breath sounds. No wheezing or rales.  Musculoskeletal:     Cervical back: Neck supple. No tenderness.     Right lower leg: No edema.     Left lower leg: No edema.  Lymphadenopathy:     Cervical: No cervical adenopathy.  Skin:    General: Skin is warm and dry.     Findings: No rash.  Neurological:     Mental Status: She is alert and oriented to person, place, and time. Mental status is at baseline.     Sensory: No sensory deficit.     Motor: No weakness.     Gait: Gait normal.  Psychiatric:        Mood and Affect: Mood normal.        Behavior: Behavior normal.            Assessment & Plan:    Headaches: Acute Last week started having frequent headaches on the left side of her head She does have a history of migraines, but these are not migraines Headache is not intractable Had a headache last night and took Advil and it resolved-no headache today No cold symptoms, allergy symptoms, neurological symptoms, cardiac symptoms Exam unremarkable without evidence of neurological deficit Your headache today Reviewed many of the common causes of headaches I do not think her vitamin D level her B12 level or the cause, but reasonable to check and advised that she needs to go back on supplementation Symptomatic treatment of headaches  Vitamin D deficiency: Chronic Not currently taking vitamin D Advised that she needs to take vitamin D daily forever or else her vitamin D level will fall Will check vitamin D level today  Vitamin B12 deficiency: Vitamin B12 level has been low in the past Not currently taking vitamin B12 Advised that she start taking B12 on a regular basis Check B12 level  Hypertension: Chronic Blood pressure borderline controlled Needs to start monitoring BP at home Continue  amlodipine 5 mg daily, metoprolol 25 mg twice daily and triamterene-hydrochlorothiazide 37.5-25 mg daily

## 2023-06-01 ENCOUNTER — Encounter: Payer: Self-pay | Admitting: Internal Medicine

## 2023-07-29 ENCOUNTER — Other Ambulatory Visit: Payer: Self-pay | Admitting: Internal Medicine

## 2023-07-29 DIAGNOSIS — I10 Essential (primary) hypertension: Secondary | ICD-10-CM

## 2023-08-20 ENCOUNTER — Other Ambulatory Visit: Payer: Self-pay | Admitting: Internal Medicine

## 2023-08-20 DIAGNOSIS — K2100 Gastro-esophageal reflux disease with esophagitis, without bleeding: Secondary | ICD-10-CM

## 2023-08-28 ENCOUNTER — Other Ambulatory Visit: Payer: Self-pay | Admitting: Internal Medicine

## 2023-08-28 DIAGNOSIS — I10 Essential (primary) hypertension: Secondary | ICD-10-CM

## 2023-09-10 ENCOUNTER — Ambulatory Visit
Admission: EM | Admit: 2023-09-10 | Discharge: 2023-09-10 | Disposition: A | Discharge status: Home / Self Care | Attending: Family Medicine | Admitting: Family Medicine

## 2023-09-10 DIAGNOSIS — K047 Periapical abscess without sinus: Secondary | ICD-10-CM | POA: Diagnosis not present

## 2023-09-10 MED ORDER — KETOROLAC TROMETHAMINE 10 MG PO TABS: 10.0000 mg | ORAL_TABLET | Freq: Four times a day (QID) | ORAL | 0 refills | Status: DC | PRN

## 2023-09-10 MED ORDER — KETOROLAC TROMETHAMINE 15 MG/ML IJ SOLN
30.0000 mg | Freq: Once | INTRAMUSCULAR | Status: AC
  Administered 2023-09-10: 30 mg via INTRAMUSCULAR

## 2023-09-10 MED ORDER — AMOXICILLIN-POT CLAVULANATE 875-125 MG PO TABS: 1.0000 | ORAL_TABLET | Freq: Two times a day (BID) | ORAL | 0 refills | Status: AC

## 2023-09-10 NOTE — Discharge Instructions (Signed)
 You have been given a shot of Toradol  30 mg today.  Ketorolac  10 mg tablets--take 1 tablet every 6 hours as needed for pain.  This is the same medicine that is in the shot we just gave you  Take amoxicillin -clavulanate 875 mg--1 tab twice daily with food for 7 days  Please call your dentist on Monday when they are open to make an appointment about this issue

## 2023-09-10 NOTE — ED Triage Notes (Signed)
 Last night a work my face started swelling and my tooth started hurting (left upper side) & causing ha at times. I did have a fall (Workers Comp. Injury).

## 2023-09-10 NOTE — ED Provider Notes (Signed)
 [facility]    CSN: [id number] Arrival date & time: 09/10/23  9176      History   Chief Complaint Chief Complaint  Patient presents with   Dental Pain    HPI [name] is a 58 y.o. female.    Dental Pain  Here for pain and swelling in her left mouth.  This began yesterday evening when she was at work.  It hurts to chew and she is noting swelling of her left cheek.  She is allergic to oxycodone and the pneumococcal vaccine.  She has had a hysterectomy  She has been taking some Tylenol  and Advil  with some slight relief.  Last EGFR was 98. Past Medical History:  Diagnosis Date   Arthritis    COVID-19 06/2018   Esophageal stenosis    Esophagitis    GERD (gastroesophageal reflux disease)    Hiatal hernia    Hypertension    Iron deficiency anemia    Migraines    Morbid obesity (HCC)    Palpitations 05/12/2011   Monitor 08/2022: NSR, rare nonsustained SVT (longest 7 beats), rare PVCs/PACs      Sleep apnea         Patient Active Problem List   Diagnosis Date Noted   Upper back pain on right side 03/06/2023   Chest pain 03/06/2023   Fever, low grade 11/26/2022   Encounter for general adult medical examination with abnormal findings 11/26/2022   Leukocytosis 09/16/2022   Acute right flank pain 09/15/2022   Right upper quadrant abdominal tenderness without rebound tenderness 09/15/2022   Intermittent palpitations 08/26/2022   Dysphagia    Esophageal diverticulum    Migraine 08/18/2017   Vitamin D  deficiency 04/27/2017   HLD (hyperlipidemia) 06/27/2016   Gastroesophageal reflux disease with esophagitis 12/20/2014   Fatty liver disease, nonalcoholic 12/11/2014   Obstructive sleep apnea 09/28/2013   Morbid obesity with BMI of 50.0-59.9, adult (HCC) 06/28/2013   Esophageal dysmotility 06/28/2013   Prediabetes 06/28/2013   Low back pain radiating to both legs 06/28/2013   Essential hypertension, benign 11/04/2011   Routine general medical  examination at a health care facility 11/04/2011   Palpitations 05/12/2011    Past Surgical History:  Procedure Laterality Date   ABDOMINAL HYSTERECTOMY     ABDOMINAL HYSTERECTOMY  2003   CARDIOVASCULAR STRESS TEST  07/25/2007   Negative stress echo; ef 55-60%   ESOPHAGEAL MANOMETRY  03/07/2012   Procedure: ESOPHAGEAL MANOMETRY (EM);  Surgeon: Alm JONELLE Gander, MD;  Location: [facility];  Service: Endoscopy;  Laterality: N/A;   ESOPHAGEAL MANOMETRY N/A 06/08/2019   Procedure: ESOPHAGEAL MANOMETRY (EM);  Surgeon: Avram Lupita BRAVO, MD;  Location: [facility];  Service: Endoscopy;  Laterality: N/A;   ESOPHAGOGASTRODUODENOSCOPY     ESOPHAGOGASTRODUODENOSCOPY     ESOPHAGOGASTRODUODENOSCOPY (EGD) WITH PROPOFOL  N/A 06/08/2019   Procedure: ESOPHAGOGASTRODUODENOSCOPY (EGD) WITH PROPOFOL ;  Surgeon: Avram Lupita BRAVO, MD;  Location: [facility];  Service: Endoscopy;  Laterality: N/A;   KNEE ARTHROSCOPY  01/2011   left   KNEE SURGERY      OB History   No obstetric history on file.      Home Medications    Prior to Admission medications   Medication Sig Start Date End Date Taking? Authorizing Provider  amLODipine  (NORVASC ) 5 MG tablet TAKE 1 TABLET(5 MG) BY MOUTH DAILY 07/29/23  Yes Joshua Debby CROME, MD  amoxicillin -clavulanate (AUGMENTIN ) 875-125 MG tablet Take 1 tablet by mouth 2 (two) times daily for 7 days. 09/10/23 09/17/23 Yes Vonna Males  K, MD  ketorolac  (TORADOL ) 10 MG tablet Take 1 tablet (10 mg total) by mouth every 6 (six) hours as needed (pain). 09/10/23  Yes Vonna Sharlet POUR, MD  metoprolol  tartrate (LOPRESSOR ) 25 MG tablet TAKE 1 TABLET BY MOUTH TWICE A DAY, MAY TAKE 1/2-1 EXTRA TABLET ONLY AS NEEDED FOR PALPITATIONS 08/28/22  Yes Lelon Hamilton T, PA-C  triamterene -hydrochlorothiazide  (DYAZIDE) 37.5-25 MG capsule Take 1 each (1 capsule total) by mouth daily. 08/26/22  Yes Joshua Debby CROME, MD  acetaminophen  (TYLENOL ) 325 MG tablet Take 650 mg by mouth every 6 (six) hours as needed  (for pain.).    [provider]  cyclobenzaprine  (FLEXERIL ) 5 MG tablet Take 1 tablet (5 mg total) by mouth 3 (three) times daily as needed. 03/04/23   Norleen Lynwood ORN, MD  famotidine  (PEPCID ) 40 MG tablet TAKE 1 TABLET(40 MG) BY MOUTH AT BEDTIME 09/12/20   Avram Lupita BRAVO, MD  omeprazole  (PRILOSEC) 40 MG capsule TAKE 1 CAPSULE BY MOUTH EVERY DAY AT 2 AM 08/24/23   Joshua Debby CROME, MD    Family History Family History  Problem Relation Age of Onset   Cervical cancer Mother    Hypertension Sister    Diabetes Sister    Heart disease Neg Hx    Cancer Neg Hx    Alcohol abuse Neg Hx    Early death Neg Hx    Hearing loss Neg Hx    Hyperlipidemia Neg Hx    Kidney disease Neg Hx    Stroke Neg Hx     Social History Social History   Tobacco Use   Smoking status: Never   Smokeless tobacco: Never  Vaping Use   Vaping status: Never Used  Substance Use Topics   Alcohol use: No   Drug use: No     Allergies   Oxycodone and Pneumococcal vaccines   Review of Systems Review of Systems   Physical Exam Triage Vital Signs ED Triage Vitals  Encounter Vitals Group     BP 09/10/23 0844 (!) 146/82     Girls Systolic BP Percentile --      Girls Diastolic BP Percentile --      Boys Systolic BP Percentile --      Boys Diastolic BP Percentile --      Pulse Rate 09/10/23 0844 63     Resp 09/10/23 0844 18     Temp 09/10/23 0844 98.2 F (36.8 C)     Temp Source 09/10/23 0844 Oral     SpO2 09/10/23 0844 98 %     Weight 09/10/23 0841 238 lb 1.6 oz (108 kg)     Height 09/10/23 0841 5' 5 (1.651 m)     Head Circumference --      Peak Flow --      Pain Score 09/10/23 0837 7     Pain Loc --      Pain Education --      Exclude from Growth Chart --    No data found.  Updated Vital Signs BP (!) 146/82 (BP Location: Left Arm)   Pulse 63   Temp 98.2 F (36.8 C) (Oral)   Resp 18   Ht 5' 5 (1.651 m)   Wt 108 kg   SpO2 98%   BMI 39.62 kg/m   Visual Acuity Right Eye Distance:    Left Eye Distance:   Bilateral Distance:    Right Eye Near:   Left Eye Near:    Bilateral Near:  Physical Exam Vitals reviewed.  Constitutional:      General: She is not in acute distress.    Appearance: She is not ill-appearing, toxic-appearing or diaphoretic.  HENT:     Mouth/Throat:     Mouth: Mucous membranes are moist.     Comments: There are some carious and broken teeth in the left upper and lower dental ridge.  There is some mild swelling of the left upper dental ridge.  There is swelling of the external left cheek.  No fluctuance or erythema. Eyes:     Extraocular Movements: Extraocular movements intact.     Pupils: Pupils are equal, round, and reactive to light.  Cardiovascular:     Rate and Rhythm: Normal rate and regular rhythm.     Heart sounds: No murmur heard. Pulmonary:     Effort: Pulmonary effort is normal.     Breath sounds: Normal breath sounds.  Musculoskeletal:     Cervical back: Neck supple.  Lymphadenopathy:     Cervical: No cervical adenopathy.  Skin:    Coloration: Skin is not jaundiced or pale.  Neurological:     Mental Status: She is alert and oriented to person, place, and time.  Psychiatric:        Behavior: Behavior normal.      UC Treatments / Results  Labs (all labs ordered are listed, but only abnormal results are displayed) Labs Reviewed - No data to display  EKG   Radiology No results found.  Procedures Procedures (including critical care time)  Medications Ordered in UC Medications  ketorolac  (TORADOL ) 15 MG/ML injection 30 mg (has no administration in time range)    Initial Impression / Assessment and Plan / UC Course  I have reviewed the triage vital signs and the nursing notes.  Pertinent labs & imaging results that were available during my care of the patient were reviewed by me and considered in my medical decision making (see chart for details).     Toradol  is given here as an injection and Toradol   tablets are sent to the pharmacy for pain.  Augmentin  is sent in for the oral infection.  She will call her dentist when they are open for an appointment Final Clinical Impressions(s) / UC Diagnoses   Final diagnoses:  Dental infection     Discharge Instructions      You have been given a shot of Toradol  30 mg today.  Ketorolac  10 mg tablets--take 1 tablet every 6 hours as needed for pain.  This is the same medicine that is in the shot we just gave you  Take amoxicillin -clavulanate 875 mg--1 tab twice daily with food for 7 days  Please call your dentist on Monday when they are open to make an appointment about this issue     ED Prescriptions     Medication Sig Dispense Auth. Provider   amoxicillin -clavulanate (AUGMENTIN ) 875-125 MG tablet Take 1 tablet by mouth 2 (two) times daily for 7 days. 14 tablet Chae Oommen K, MD   ketorolac  (TORADOL ) 10 MG tablet Take 1 tablet (10 mg total) by mouth every 6 (six) hours as needed (pain). 20 tablet Sabreena Vogan, Sharlet POUR, MD      I have reviewed the PDMP during this encounter.   Vonna Sharlet POUR, MD 09/10/23 [phone]

## 2023-09-23 ENCOUNTER — Ambulatory Visit: Payer: Self-pay

## 2023-09-23 ENCOUNTER — Ambulatory Visit: Admitting: Internal Medicine

## 2023-09-23 ENCOUNTER — Encounter: Payer: Self-pay | Admitting: Internal Medicine

## 2023-09-23 VITALS — BP 130/72 | HR 62 | Temp 98.5°F | Ht 65.0 in | Wt 235.0 lb

## 2023-09-23 DIAGNOSIS — M542 Cervicalgia: Secondary | ICD-10-CM | POA: Insufficient documentation

## 2023-09-23 DIAGNOSIS — R7303 Prediabetes: Secondary | ICD-10-CM | POA: Diagnosis not present

## 2023-09-23 DIAGNOSIS — I10 Essential (primary) hypertension: Secondary | ICD-10-CM | POA: Diagnosis not present

## 2023-09-23 MED ORDER — PREDNISONE 10 MG PO TABS
ORAL_TABLET | ORAL | 0 refills | Status: DC
Start: 2023-09-23 — End: 2023-12-14

## 2023-09-23 MED ORDER — CYCLOBENZAPRINE HCL 5 MG PO TABS: 5.0000 mg | ORAL_TABLET | Freq: Three times a day (TID) | ORAL | 1 refills | Status: DC | PRN

## 2023-09-23 MED ORDER — TRAMADOL HCL 50 MG PO TABS: 50.0000 mg | ORAL_TABLET | Freq: Four times a day (QID) | ORAL | 0 refills | Status: DC | PRN

## 2023-09-23 NOTE — Assessment & Plan Note (Addendum)
 Exam c/w msk strain with spasm, moderate, can't r/o underlying c spine djd or ddd, now for trial of tramadol  prn, prednisone  taper, flexeril  tid prn, and consider C spine plain films for this new problem but declines for now

## 2023-09-23 NOTE — Patient Instructions (Signed)
 Please take all new medication as prescribed- the tramadol  as needed, prednisone , and muscle relaxer as needed as well  Please continue all other medications as before, and refills have been done if requested.  Please have the pharmacy call with any other refills you may need.  Please keep your appointments with your specialists as you may have planned

## 2023-09-23 NOTE — Telephone Encounter (Signed)
 FYI Only or Action Required?: FYI only for provider.  Patient was last seen in primary care on 05/31/2023 by Geofm Glade PARAS, MD.  Called Nurse Triage reporting Headache.  Symptoms began yesterday.  Interventions attempted: Nothing.  Symptoms are: unchanged.  Triage Disposition: See Physician Within 24 Hours  Patient/caregiver understands and will follow disposition?: Yes     Copied from CRM [id number]. Topic: Clinical - Red Word Triage >> Sep 23, 2023  8:04 AM Donna BRAVO wrote: Red Word that prompted transfer to Nurse Triage: patient back of head hurts, lower back of head and neck shoulder area does not feel right, feel week and tired, slight headache Reason for Disposition  [1] MODERATE headache (e.g., interferes with normal activities) AND [2] present > 24 hours AND [3] unexplained  (Exceptions: Pain medicines not tried, typical migraine, or headache part of viral illness.)  Answer Assessment - Initial Assessment Questions 1. LOCATION: Where does it hurt?      Back of the head, to the neck and shoulders; states doesn't feel right, weak and tired. 2. ONSET: When did the headache start? (e.g., minutes, hours, days)      yesterday 3. PATTERN: Does the pain come and go, or has it been constant since it started?     constant 4. SEVERITY: How bad is the pain? and What does it keep you from doing?  (e.g., Scale 1-10; mild, moderate, or severe)     moderate 5. RECURRENT SYMPTOM: Have you ever had headaches before? If Yes, ask: When was the last time? and What happened that time?      no 6. CAUSE: What do you think is causing the headache?     Hx migraine 7. MIGRAINE: Have you been diagnosed with migraine headaches? If Yes, ask: Is this headache similar?     yes 8. HEAD INJURY: Has there been any recent injury to your head?      denies 9. OTHER SYMPTOMS: Do you have any other symptoms? (e.g., fever, stiff neck, eye pain, sore throat, cold symptoms)     Neck  pain, shoulder pain, chills 10. PREGNANCY: Is there any chance you are pregnant? When was your last menstrual period?       na  Protocols used: Headache-A-AH

## 2023-09-23 NOTE — Progress Notes (Signed)
 Patient ID: [name], female   DOB: [birth year], 58 y.o.   MRN: [id number]       Chief Complaint: follow up lower right posterior neck pain x 2 days, preDM, htn       HPI:  [name] is a 58 y.o. 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  No falls, fever, trauma, Pt denies chest pain, increased sob or doe, wheezing, orthopnea, PND, increased LE swelling, palpitations, dizziness or syncope.   Pt denies polydipsia, polyuria, or new focal neuro s/s.          Wt Readings from Last 3 Encounters:  09/23/23 235 lb (106.6 kg)  09/10/23 238 lb 1.6 oz (108 kg)  05/31/23 238 lb (108 kg)   BP Readings from Last 3 Encounters:  09/23/23 130/72  09/10/23 (!) 146/82  05/31/23 134/84         Past Medical History:  Diagnosis Date   Arthritis    COVID-19 06/2018   Esophageal stenosis    Esophagitis    GERD (gastroesophageal reflux disease)    Hiatal hernia    Hypertension    Iron deficiency anemia    Migraines    Morbid obesity (HCC)    Palpitations 05/12/2011   Monitor 08/2022: NSR, rare nonsustained SVT (longest 7 beats), rare PVCs/PACs      Sleep apnea        Past Surgical History:  Procedure Laterality Date   ABDOMINAL HYSTERECTOMY     ABDOMINAL HYSTERECTOMY  2003   CARDIOVASCULAR STRESS TEST  07/25/2007   Negative stress echo; ef 55-60%   ESOPHAGEAL MANOMETRY  03/07/2012   Procedure: ESOPHAGEAL MANOMETRY (EM);  Surgeon: Alm JONELLE Gander, MD;  Location: [facility];  Service: Endoscopy;  Laterality: N/A;   ESOPHAGEAL MANOMETRY N/A 06/08/2019   Procedure: ESOPHAGEAL MANOMETRY (EM);  Surgeon: Avram Lupita BRAVO, MD;  Location: [facility];  Service: Endoscopy;  Laterality: N/A;   ESOPHAGOGASTRODUODENOSCOPY     ESOPHAGOGASTRODUODENOSCOPY     ESOPHAGOGASTRODUODENOSCOPY (EGD) WITH PROPOFOL  N/A 06/08/2019   Procedure:  ESOPHAGOGASTRODUODENOSCOPY (EGD) WITH PROPOFOL ;  Surgeon: Avram Lupita BRAVO, MD;  Location: [facility];  Service: Endoscopy;  Laterality: N/A;   KNEE ARTHROSCOPY  01/2011   left   KNEE SURGERY      reports that she has never smoked. She has never used smokeless tobacco. She reports that she does not drink alcohol and does not use drugs. family history includes Cervical cancer in her mother; Diabetes in her sister; Hypertension in her sister. Allergies  Allergen Reactions   Oxycodone Shortness Of Breath   Pneumococcal Vaccines Itching and Swelling    Swelling caused by prevnar (06/22/2016).   Current Outpatient Medications on File Prior to Visit  Medication Sig Dispense Refill   acetaminophen  (TYLENOL ) 325 MG tablet Take 650 mg by mouth every 6 (six) hours as needed (for pain.).     amLODipine  (NORVASC ) 5 MG tablet TAKE 1 TABLET(5 MG) BY MOUTH DAILY 30 tablet 0   famotidine  (PEPCID ) 40 MG tablet TAKE 1 TABLET(40 MG) BY MOUTH AT BEDTIME 90 tablet 3   ketorolac  (TORADOL ) 10 MG tablet Take 1 tablet (10 mg total) by mouth every 6 (six) hours as needed (pain). 20 tablet 0   metoprolol  tartrate (LOPRESSOR ) 25 MG tablet TAKE 1 TABLET BY MOUTH TWICE A DAY, MAY TAKE 1/2-1 EXTRA TABLET ONLY AS NEEDED FOR  PALPITATIONS 195 tablet 3   omeprazole  (PRILOSEC) 40 MG capsule TAKE 1 CAPSULE BY MOUTH EVERY DAY AT 2 AM 90 capsule 1   triamterene -hydrochlorothiazide  (DYAZIDE) 37.5-25 MG capsule Take 1 each (1 capsule total) by mouth daily. 90 capsule 0   No current facility-administered medications on file prior to visit.        ROS:  All others reviewed and negative.  Objective        PE:  BP 130/72   Pulse 62   Temp 98.5 F (36.9 C)   Ht 5' 5 (1.651 m)   Wt 235 lb (106.6 kg)   SpO2 99%   BMI 39.11 kg/m                 Constitutional: Pt appears in NAD               HENT: Head: NCAT.                Right Ear: External ear normal.                 Left Ear: External ear normal.                 Eyes: . Pupils are equal, round, and reactive to light. Conjunctivae and EOM are normal               Nose: without d/c or deformity               Neck: Gross normal ROM but still and sore, has bilateral left > right muscular tension spasm tenderness               Cardiovascular: Normal rate and regular rhythm.                 Pulmonary/Chest: Effort normal and breath sounds without rales or wheezing.                               Neurological: Pt is alert. At baseline orientation, motor grossly intact               Skin: Skin is warm. No rashes, no other new lesions, LE edema - none               Psychiatric: Pt behavior is normal without agitation   Micro: none  Cardiac tracings I have personally interpreted today:  none  Pertinent Radiological findings (summarize): none   Lab Results  Component Value Date   WBC 7.8 05/31/2023   HGB 12.5 05/31/2023   HCT 38.0 05/31/2023   PLT 282.0 05/31/2023   GLUCOSE 88 05/31/2023   CHOL 194 11/27/2021   TRIG 82.0 11/27/2021   HDL 56.00 11/27/2021   LDLCALC 122 (H) 11/27/2021   ALT 8 05/31/2023   AST 16 05/31/2023   NA 141 05/31/2023   K 4.4 05/31/2023   CL 106 05/31/2023   CREATININE 0.60 05/31/2023   BUN 11 05/31/2023   CO2 28 05/31/2023   TSH 1.39 11/27/2021   INR 1.0 07/06/2007   HGBA1C 5.9 11/26/2022   Assessment/Plan:  [name] is a 58 y.o. Black or African American [2] female with  has a past medical history of Arthritis, COVID-19 (06/2018), Esophageal stenosis, Esophagitis, GERD (gastroesophageal reflux disease), Hiatal hernia, Hypertension, Iron deficiency anemia, Migraines, Morbid obesity (HCC), Palpitations (05/12/2011), and Sleep apnea.  Posterior neck pain Exam c/w msk strain with spasm, moderate, can't  r/o underlying c spine djd or ddd, now for trial of tramadol  prn, prednisone  taper, flexeril  tid prn, and consider C spine plain films for this new problem but declines for now  Essential hypertension, benign BP  Readings from Last 3 Encounters:  09/23/23 130/72  09/10/23 (!) 146/82  05/31/23 134/84   Stable, pt to continue medical treatment norvasc  5 every day, lopressor  25 bid, dyazide 1 qd   Prediabetes Lab Results  Component Value Date   HGBA1C 5.9 11/26/2022   Stable, pt to continue current medical treatment  - diet, wt control  Followup: Return if symptoms worsen or fail to improve.  Gina Rush, MD 09/23/2023 7:00 PM [facility] - [facility]

## 2023-09-23 NOTE — Assessment & Plan Note (Signed)
Lab Results  Component Value Date   HGBA1C 5.9 11/26/2022   Stable, pt to continue current medical treatment  - diet, wt control

## 2023-09-23 NOTE — Assessment & Plan Note (Signed)
 BP Readings from Last 3 Encounters:  09/23/23 130/72  09/10/23 (!) 146/82  05/31/23 134/84   Stable, pt to continue medical treatment norvasc  5 every day, lopressor  25 bid, dyazide 1 qd

## 2023-09-26 ENCOUNTER — Other Ambulatory Visit: Payer: Self-pay | Admitting: Physician Assistant

## 2023-09-28 ENCOUNTER — Other Ambulatory Visit: Payer: Self-pay

## 2023-09-28 MED ORDER — METOPROLOL TARTRATE 25 MG PO TABS: ORAL_TABLET | ORAL | 1 refills | Status: AC

## 2023-12-14 ENCOUNTER — Ambulatory Visit (INDEPENDENT_AMBULATORY_CARE_PROVIDER_SITE_OTHER): Payer: Self-pay | Admitting: Internal Medicine

## 2023-12-14 ENCOUNTER — Encounter: Payer: Self-pay | Admitting: Internal Medicine

## 2023-12-14 VITALS — BP 144/84 | HR 68 | Temp 98.8°F | Resp 16 | Ht 65.0 in | Wt 234.6 lb

## 2023-12-14 DIAGNOSIS — Z0000 Encounter for general adult medical examination without abnormal findings: Secondary | ICD-10-CM | POA: Diagnosis not present

## 2023-12-14 DIAGNOSIS — Z23 Encounter for immunization: Secondary | ICD-10-CM | POA: Diagnosis not present

## 2023-12-14 DIAGNOSIS — K760 Fatty (change of) liver, not elsewhere classified: Secondary | ICD-10-CM

## 2023-12-14 DIAGNOSIS — Z0001 Encounter for general adult medical examination with abnormal findings: Secondary | ICD-10-CM

## 2023-12-14 DIAGNOSIS — E7800 Pure hypercholesterolemia, unspecified: Secondary | ICD-10-CM

## 2023-12-14 DIAGNOSIS — R7303 Prediabetes: Secondary | ICD-10-CM

## 2023-12-14 DIAGNOSIS — E559 Vitamin D deficiency, unspecified: Secondary | ICD-10-CM

## 2023-12-14 DIAGNOSIS — I10 Essential (primary) hypertension: Secondary | ICD-10-CM

## 2023-12-14 LAB — URINALYSIS, ROUTINE W REFLEX MICROSCOPIC
Bilirubin Urine: NEGATIVE
Ketones, ur: NEGATIVE
Nitrite: NEGATIVE
Specific Gravity, Urine: 1.01 (ref 1.000–1.030)
Total Protein, Urine: NEGATIVE
Urine Glucose: NEGATIVE
Urobilinogen, UA: 0.2 (ref 0.0–1.0)
pH: 6 (ref 5.0–8.0)

## 2023-12-14 LAB — LIPID PANEL
Cholesterol: 197 mg/dL (ref 0–200)
HDL: 56.6 mg/dL (ref >39.00)
LDL Cholesterol: 121 mg/dL — ABNORMAL HIGH (ref 0–99)
NonHDL: 140.05
Total CHOL/HDL Ratio: 3
Triglycerides: 93 mg/dL (ref 0.0–149.0)
VLDL: 18.6 mg/dL (ref 0.0–40.0)

## 2023-12-14 LAB — BASIC METABOLIC PANEL WITH GFR
BUN: 5 mg/dL — ABNORMAL LOW (ref 6–23)
CO2: 30 meq/L (ref 19–32)
Calcium: 9.5 mg/dL (ref 8.4–10.5)
Chloride: 103 meq/L (ref 96–112)
Creatinine, Ser: 0.48 mg/dL (ref 0.40–1.20)
GFR: 103.68 mL/min (ref >60.00)
Glucose, Bld: 89 mg/dL (ref 70–99)
Potassium: 3.9 meq/L (ref 3.5–5.1)
Sodium: 140 meq/L (ref 135–145)

## 2023-12-14 LAB — HEPATIC FUNCTION PANEL
ALT: 8 U/L (ref 0–35)
AST: 16 U/L (ref 0–37)
Albumin: 4.1 g/dL (ref 3.5–5.2)
Alkaline Phosphatase: 77 U/L (ref 39–117)
Bilirubin, Direct: 0 mg/dL (ref 0.0–0.3)
Total Bilirubin: 0.5 mg/dL (ref 0.2–1.2)
Total Protein: 7.6 g/dL (ref 6.0–8.3)

## 2023-12-14 LAB — PROTIME-INR
INR: 1.1 ratio — ABNORMAL HIGH (ref 0.8–1.0)
Prothrombin Time: 12 s (ref 9.6–13.1)

## 2023-12-14 LAB — CBC WITH DIFFERENTIAL/PLATELET
Basophils Absolute: 0.1 K/uL (ref 0.0–0.1)
Basophils Relative: 1.3 % (ref 0.0–3.0)
Eosinophils Absolute: 0.4 K/uL (ref 0.0–0.7)
Eosinophils Relative: 4 % (ref 0.0–5.0)
HCT: 37.9 % (ref 36.0–46.0)
Hemoglobin: 12.4 g/dL (ref 12.0–15.0)
Lymphocytes Relative: 37.6 % (ref 12.0–46.0)
Lymphs Abs: 3.7 K/uL (ref 0.7–4.0)
MCHC: 32.8 g/dL (ref 30.0–36.0)
MCV: 85 fl (ref 78.0–100.0)
Monocytes Absolute: 0.8 K/uL (ref 0.1–1.0)
Monocytes Relative: 8.5 % (ref 3.0–12.0)
Neutro Abs: 4.8 K/uL (ref 1.4–7.7)
Neutrophils Relative %: 48.6 % (ref 43.0–77.0)
Platelets: 270 K/uL (ref 150.0–400.0)
RBC: 4.45 Mil/uL (ref 3.87–5.11)
RDW: 14.7 % (ref 11.5–15.5)
WBC: 9.8 K/uL (ref 4.0–10.5)

## 2023-12-14 LAB — TSH: TSH: 1.69 u[IU]/mL (ref 0.35–5.50)

## 2023-12-14 LAB — VITAMIN D 25 HYDROXY (VIT D DEFICIENCY, FRACTURES): VITD: 15.35 ng/mL — ABNORMAL LOW (ref 30.00–100.00)

## 2023-12-14 LAB — HEMOGLOBIN A1C: Hgb A1c MFr Bld: 5.9 % (ref 4.6–6.5)

## 2023-12-14 NOTE — Patient Instructions (Signed)

## 2023-12-14 NOTE — Progress Notes (Signed)
 Subjective:  Patient ID: [name], female    DOB: Jul 07, 1965  Age: 58 y.o. MRN: [id number]  CC: Annual Exam and Hypertension   HPI [name] presents for a CPX and f/up ---  Discussed the use of AI scribe software for clinical note transcription with the patient, who gave verbal consent to proceed.  History of Present Illness [name] is a 58 year old female who presents with concerns of brain fog and possible vitamin D  deficiency following a fall.  In January, she experienced a fall at work, resulting in facial trauma. She hit her face on the pavement, breaking a plate in her mouth, but did not lose consciousness. She describes the fall as one where she could see herself falling but was unable to prevent it. She did not have a concussion but sustained a bruise.  Since the fall, she experiences intermittent brain fog, characterized by forgetfulness, difficulty finding words, and occasionally missing appointments. She denies any loss of consciousness at the time of the fall and did not undergo a brain scan. She associates these symptoms with the fall but also considers the possibility of low vitamin D  levels contributing to her symptoms.  She feels tired and sometimes experiences lightheadedness. No chest pain, shortness of breath, dizziness, or numbness. She has not had an EKG in the past year.  She has received a flu shot and is up to date with her pneumonia vaccine. She has an upcoming appointment for a mammogram and Pap smear in December.  No numbness, weakness, or tingling. No swelling in her legs or feet.   Outpatient Medications Prior to Visit  Medication Sig Dispense Refill   acetaminophen  (TYLENOL ) 325 MG tablet Take 650 mg by mouth every 6 (six) hours as needed (for pain.).     famotidine  (PEPCID ) 40 MG tablet TAKE 1 TABLET(40 MG) BY MOUTH AT BEDTIME 90 tablet 3   metoprolol  tartrate (LOPRESSOR ) 25 MG tablet TAKE 1 TABLET BY MOUTH TWICE A DAY, MAY TAKE  1/2-1 EXTRA TABLET ONLY AS NEEDED FOR PALPITATIONS 225 tablet 1   omeprazole  (PRILOSEC) 40 MG capsule TAKE 1 CAPSULE BY MOUTH EVERY DAY AT 2 AM 90 capsule 1   triamterene -hydrochlorothiazide  (DYAZIDE) 37.5-25 MG capsule Take 1 each (1 capsule total) by mouth daily. 90 capsule 0   amLODipine  (NORVASC ) 5 MG tablet TAKE 1 TABLET(5 MG) BY MOUTH DAILY 30 tablet 0   cyclobenzaprine  (FLEXERIL ) 5 MG tablet Take 1 tablet (5 mg total) by mouth 3 (three) times daily as needed. 40 tablet 1   ketorolac  (TORADOL ) 10 MG tablet Take 1 tablet (10 mg total) by mouth every 6 (six) hours as needed (pain). 20 tablet 0   predniSONE  (DELTASONE ) 10 MG tablet 3 tabs by mouth per day for 3 days,2tabs per day for 3 days,1tab per day for 3 days 18 tablet 0   traMADol  (ULTRAM ) 50 MG tablet Take 1 tablet (50 mg total) by mouth every 6 (six) hours as needed. 30 tablet 0   No facility-administered medications prior to visit.    ROS Review of Systems  Objective:  BP (!) 144/84 (BP Location: Left Arm, Patient Position: Sitting, Cuff Size: Large) Comment: BP (R) 142/78  Pulse 68   Temp 98.8 F (37.1 C) (Oral)   Resp 16   Ht 5' 5 (1.651 m)   Wt 234 lb 9.6 oz (106.4 kg)   SpO2 97%   BMI 39.04 kg/m   BP Readings from Last 3 Encounters:  12/14/23 (!) 144/84  09/23/23 130/72  09/10/23 (!) 146/82    Wt Readings from Last 3 Encounters:  12/14/23 234 lb 9.6 oz (106.4 kg)  09/23/23 235 lb (106.6 kg)  09/10/23 238 lb 1.6 oz (108 kg)    Physical Exam Vitals reviewed.  Constitutional:      Appearance: Normal appearance.  HENT:     Mouth/Throat:     Mouth: Mucous membranes are moist.  Eyes:     General: No scleral icterus.    Conjunctiva/sclera: Conjunctivae normal.  Cardiovascular:     Rate and Rhythm: Normal rate.     Heart sounds: No murmur heard.    No friction rub. No [name].     Comments: EKG ----- NSR, 62 bpm No LVH, Q waves, or ST/T wave changes  Pulmonary:     Effort: Pulmonary effort is normal.      Breath sounds: No stridor. No wheezing, rhonchi or rales.  Abdominal:     General: Abdomen is protuberant. Bowel sounds are normal. There is no distension.     Palpations: Abdomen is soft. There is no hepatomegaly, splenomegaly or mass.     Tenderness: There is no abdominal tenderness. There is no guarding or rebound.  Musculoskeletal:        General: Normal range of motion.     Cervical back: Neck supple.     Right lower leg: No edema.     Left lower leg: No edema.  Lymphadenopathy:     Cervical: No cervical adenopathy.  Skin:    General: Skin is warm and dry.  Neurological:     General: No focal deficit present.     Mental Status: She is alert.  Psychiatric:        Behavior: Behavior normal.     Lab Results  Component Value Date   WBC 9.8 12/14/2023   HGB 12.4 12/14/2023   HCT 37.9 12/14/2023   PLT 270.0 12/14/2023   GLUCOSE 89 12/14/2023   CHOL 197 12/14/2023   TRIG 93.0 12/14/2023   HDL 56.60 12/14/2023   LDLCALC 121 (H) 12/14/2023   ALT 8 12/14/2023   AST 16 12/14/2023   NA 140 12/14/2023   K 3.9 12/14/2023   CL 103 12/14/2023   CREATININE 0.48 12/14/2023   BUN 5 (L) 12/14/2023   CO2 30 12/14/2023   TSH 1.69 12/14/2023   INR 1.1 (H) 12/14/2023   HGBA1C 5.9 12/14/2023     Fibrosis 4 Score = 1.24 (Low risk)        Interpretation for patients with NAFLD          <1.30       -  F0-F1 (Low risk)          1.30-2.67 -  Indeterminate           >2.67      -  F3-F4 (High risk)     Validated for ages 38-65         Assessment & Plan:  Need for immunization against influenza -     Flu vaccine trivalent PF, 6mos and older(Flulaval,Afluria,Fluarix,Fluzone)  Routine general medical examination at a health care facility- Exam completed, labs reviewed, vaccines reviewed and updated, cancer screenings are UTD, pt ed material was given.  -     Lipid panel; Future  Prediabetes -     Basic metabolic panel with GFR; Future -     Hemoglobin A1c; Future  Vitamin D   deficiency -     VITAMIN D  25 Hydroxy (Vit-D Deficiency,  Fractures); Future -     Cholecalciferol ; Take 1 capsule (50,000 Units total) by mouth once a week.  Dispense: 12 capsule; Refill: 0  Fatty liver disease, nonalcoholic -     Hepatic function panel; Future -     Protime-INR; Future  Essential hypertension, benign- Her BP is adequately well controlled. -     Basic metabolic panel with GFR; Future -     CBC with Differential/Platelet; Future -     TSH; Future -     Urinalysis, Routine w reflex microscopic; Future -     EKG 12-Lead -     AMB Referral VBCI Care Management -     amLODIPine  Besylate; Take 1 tablet (5 mg total) by mouth daily.  Dispense: 90 tablet; Refill: 0  Pure hypercholesterolemia- Statin is not indicated.     Follow-up: Return in about 6 months (around 06/13/2024).  Debby Molt, MD

## 2023-12-15 ENCOUNTER — Ambulatory Visit: Payer: Self-pay | Admitting: Internal Medicine

## 2023-12-15 MED ORDER — AMLODIPINE BESYLATE 5 MG PO TABS: 5.0000 mg | ORAL_TABLET | Freq: Every day | ORAL | 0 refills | Status: AC

## 2023-12-15 MED ORDER — CHOLECALCIFEROL 1.25 MG (50000 UT) PO CAPS: 50000.0000 [IU] | ORAL_CAPSULE | ORAL | 0 refills | Status: AC

## 2023-12-16 ENCOUNTER — Telehealth: Payer: Self-pay | Admitting: *Deleted

## 2023-12-16 NOTE — Progress Notes (Signed)
 Care Guide Pharmacy Note  12/16/2023 Name: Dinara Lupu MRN: [id number] DOB: Dec 02, 1965  Referred By: Joshua Debby CROME, MD Reason for referral: Call Attempt #1 and Complex Care Management (Outreach to schedule referral with pharmacist )   Liberti Appleton is a 58 y.o. year old female who is a primary care patient of Joshua Debby CROME, MD.  Lucie Gallop was referred to the pharmacist for assistance related to: HTN  An unsuccessful telephone outreach was attempted today to contact the patient who was referred to the pharmacy team for assistance with medication management. Additional attempts will be made to contact the patient.  Thedford Franks, CMA [place]  [facility], [facility] Guide Direct Dial: [phone]  Fax: [phone] Website: [email or web address]

## 2023-12-17 NOTE — Progress Notes (Signed)
 Care Guide Pharmacy Note  12/17/2023 Name: [name] MRN: [id number] DOB: 1965/07/29  Referred By: Joshua Debby CROME, MD Reason for referral: Call Attempt #1 and Complex Care Management (Outreach to schedule referral with pharmacist )   [name] is a 58 y.o. year old female who is a primary care patient of Joshua Debby CROME, MD.  Lucie Gallop was referred to the pharmacist for assistance related to: HTN  A second unsuccessful telephone outreach was attempted today to contact the patient who was referred to the pharmacy team for assistance with medication management. Additional attempts will be made to contact the patient.  Thedford Franks, CMA [place]  [facility], [facility] Guide Direct Dial: [phone]  Fax: [phone] Website: [email or web address]

## 2023-12-20 NOTE — Progress Notes (Signed)
 Care Guide Pharmacy Note  12/20/2023 Name: [name] MRN: [id number] DOB: [birth year]  Referred By: Joshua Debby CROME, MD Reason for referral: Call Attempt #1 and Complex Care Management (Outreach to schedule referral with pharmacist )   [name] is a 58 y.o. year old female who is a primary care patient of Joshua Debby CROME, MD.  [name] was referred to the pharmacist for assistance related to: HTN  A third unsuccessful telephone outreach was attempted today to contact the patient who was referred to the pharmacy team for assistance with medication management. The Population Health team is pleased to engage with this patient at any time in the future upon receipt of referral and should he/she be interested in assistance from the Population Health team.  [name], CMA [facility]  [facility], [facility] Guide Direct Dial: [phone]  Fax: [phone] Website: [email or web address]

## 2023-12-22 DIAGNOSIS — Z23 Encounter for immunization: Secondary | ICD-10-CM | POA: Insufficient documentation

## 2024-02-17 ENCOUNTER — Ambulatory Visit: Payer: PRIVATE HEALTH INSURANCE

## 2024-02-17 ENCOUNTER — Ambulatory Visit
Admission: RE | Admit: 2024-02-17 | Discharge: 2024-02-17 | Disposition: A | Discharge status: Home / Self Care | Payer: Self-pay | Source: Ambulatory Visit | Attending: Family Medicine | Admitting: Family Medicine

## 2024-02-17 ENCOUNTER — Ambulatory Visit (INDEPENDENT_AMBULATORY_CARE_PROVIDER_SITE_OTHER): Payer: Self-pay

## 2024-02-17 VITALS — BP 120/76 | HR 56 | Temp 98.2°F | Resp 18 | Ht 65.0 in | Wt 234.0 lb

## 2024-02-17 DIAGNOSIS — M549 Dorsalgia, unspecified: Secondary | ICD-10-CM

## 2024-02-17 MED ORDER — KETOROLAC TROMETHAMINE 30 MG/ML IJ SOLN: 30.0000 mg | Freq: Once | INTRAMUSCULAR | Status: DC

## 2024-02-17 MED ORDER — TIZANIDINE HCL 4 MG PO TABS: 4.0000 mg | ORAL_TABLET | Freq: Four times a day (QID) | ORAL | 0 refills | Status: AC | PRN

## 2024-02-17 MED ORDER — KETOROLAC TROMETHAMINE 15 MG/ML IJ SOLN
30.0000 mg | Freq: Once | INTRAMUSCULAR | Status: AC
  Administered 2024-02-17: 30 mg via INTRAMUSCULAR

## 2024-02-17 NOTE — ED Triage Notes (Signed)
 Patient reports having right mid to lower back pain, even slight movements cause tremendous pain. This began end of last week and recently increased exercising. No sob. The pain at times does radiate around to/around right breast when really hurting. No mid/left sided chest pain. No recent illness. No fall/injury known.

## 2024-02-17 NOTE — ED Provider Notes (Signed)
 [facility]    CSN: [id number] Arrival date & time: 02/17/24  0849      History   Chief Complaint Chief Complaint  Patient presents with   Pain    HPI [name] is a 58 y.o. female.   Patient is here for right sided back pain.  The mid and lower right back.  Started about 1 week ago, but worsening.  She is taking tylenol /advil  with little help.  Painful with movements.  She is exercising more to loose weight, but otherwise no known injury.  No urinary symptoms noted.  No sob or difficulty breathing.  No uri symptoms.       Past Medical History:  Diagnosis Date   Arthritis    COVID-19 06/2018   Esophageal stenosis    Esophagitis    GERD (gastroesophageal reflux disease)    Hiatal hernia    Hypertension    Iron deficiency anemia    Migraines    Morbid obesity (HCC)    Palpitations 05/12/2011   Monitor 08/2022: NSR, rare nonsustained SVT (longest 7 beats), rare PVCs/PACs      Sleep apnea         Patient Active Problem List   Diagnosis Date Noted   Need for immunization against influenza 12/22/2023   Intermittent palpitations 08/26/2022   Esophageal diverticulum    Migraine 08/18/2017   Vitamin D  deficiency 04/27/2017   HLD (hyperlipidemia) 06/27/2016   Gastroesophageal reflux disease with esophagitis 12/20/2014   Fatty liver disease, nonalcoholic 12/11/2014   Obstructive sleep apnea 09/28/2013   Esophageal dysmotility 06/28/2013   Prediabetes 06/28/2013   Low back pain radiating to both legs 06/28/2013   Essential hypertension, benign 11/04/2011   Palpitations 05/12/2011    Past Surgical History:  Procedure Laterality Date   ABDOMINAL HYSTERECTOMY     ABDOMINAL HYSTERECTOMY  2003   CARDIOVASCULAR STRESS TEST  07/25/2007   Negative stress echo; ef 55-60%   ESOPHAGEAL MANOMETRY  03/07/2012   Procedure: ESOPHAGEAL MANOMETRY (EM);  Surgeon: Alm JONELLE Gander, MD;  Location: [facility];  Service: Endoscopy;  Laterality: N/A;    ESOPHAGEAL MANOMETRY N/A 06/08/2019   Procedure: ESOPHAGEAL MANOMETRY (EM);  Surgeon: Avram Lupita BRAVO, MD;  Location: [facility];  Service: Endoscopy;  Laterality: N/A;   ESOPHAGOGASTRODUODENOSCOPY     ESOPHAGOGASTRODUODENOSCOPY     ESOPHAGOGASTRODUODENOSCOPY (EGD) WITH PROPOFOL  N/A 06/08/2019   Procedure: ESOPHAGOGASTRODUODENOSCOPY (EGD) WITH PROPOFOL ;  Surgeon: Avram Lupita BRAVO, MD;  Location: [facility];  Service: Endoscopy;  Laterality: N/A;   KNEE ARTHROSCOPY  01/2011   left   KNEE SURGERY      OB History   No obstetric history on file.      Home Medications    Prior to Admission medications  Medication Sig Start Date End Date Taking? Authorizing Provider  ibuprofen  (ADVIL ) 200 MG tablet Take 600 mg by mouth every 6 (six) hours as needed.   Yes [provider]  sucralfate  (CARAFATE ) 1 GM/10ML suspension 10 mL orally QIDACHS; Duration: 30 day(s) 04/26/06  Yes [provider]  acetaminophen  (TYLENOL ) 325 MG tablet Take 650 mg by mouth every 6 (six) hours as needed (for pain.).    [provider]  amLODipine  (NORVASC ) 5 MG tablet Take 1 tablet (5 mg total) by mouth daily. 12/15/23   Joshua Debby CROME, MD  Cholecalciferol  1.25 MG (50000 UT) capsule Take 1 capsule (50,000 Units total) by mouth once a week. 12/15/23   Joshua Debby CROME, MD  famotidine  (PEPCID ) 40 MG tablet  TAKE 1 TABLET(40 MG) BY MOUTH AT BEDTIME 09/12/20   Avram Lupita BRAVO, MD  metoprolol  tartrate (LOPRESSOR ) 25 MG tablet TAKE 1 TABLET BY MOUTH TWICE A DAY, MAY TAKE 1/2-1 EXTRA TABLET ONLY AS NEEDED FOR PALPITATIONS 09/28/23   Lelon Hamilton T, PA-C  omeprazole  (PRILOSEC) 40 MG capsule TAKE 1 CAPSULE BY MOUTH EVERY DAY AT 2 AM 08/24/23   Joshua Debby CROME, MD  triamterene -hydrochlorothiazide  (DYAZIDE) 37.5-25 MG capsule Take 1 each (1 capsule total) by mouth daily. 08/26/22   Joshua Debby CROME, MD    Family History Family History  Problem Relation Age of Onset   Cervical cancer Mother    Hypertension  Sister    Diabetes Sister    Heart disease Neg Hx    Cancer Neg Hx    Alcohol abuse Neg Hx    Early death Neg Hx    Hearing loss Neg Hx    Hyperlipidemia Neg Hx    Kidney disease Neg Hx    Stroke Neg Hx     Social History Social History[1]   Allergies   Oxycodone and Pneumococcal vaccines   Review of Systems Review of Systems  Constitutional: Negative.   HENT: Negative.    Respiratory: Negative.    Cardiovascular: Negative.   Gastrointestinal: Negative.   Musculoskeletal:  Positive for back pain.     Physical Exam Triage Vital Signs ED Triage Vitals  Encounter Vitals Group     BP 02/17/24 0901 120/76     Girls Systolic BP Percentile --      Girls Diastolic BP Percentile --      Boys Systolic BP Percentile --      Boys Diastolic BP Percentile --      Pulse Rate 02/17/24 0901 (!) 56     Resp 02/17/24 0901 18     Temp 02/17/24 0901 98.2 F (36.8 C)     Temp Source 02/17/24 0901 Oral     SpO2 02/17/24 0901 97 %     Weight 02/17/24 0900 234 lb (106.1 kg)     Height 02/17/24 0900 5' 5 (1.651 m)     Head Circumference --      Peak Flow --      Pain Score 02/17/24 0857 8     Pain Loc --      Pain Education --      Exclude from Growth Chart --    No data found.  Updated Vital Signs BP 120/76 (BP Location: Right Arm)   Pulse (!) 56   Temp 98.2 F (36.8 C) (Oral)   Resp 18   Ht 5' 5 (1.651 m)   Wt 106.1 kg   SpO2 97%   BMI 38.94 kg/m   Visual Acuity Right Eye Distance:   Left Eye Distance:   Bilateral Distance:    Right Eye Near:   Left Eye Near:    Bilateral Near:     Physical Exam Constitutional:      Appearance: Normal appearance. She is normal weight.  Cardiovascular:     Rate and Rhythm: Regular rhythm.  Pulmonary:     Effort: Pulmonary effort is normal.     Breath sounds: Normal breath sounds. No wheezing or rhonchi.  Musculoskeletal:     Cervical back: Normal range of motion.     Comments: No spinous tenderness;  TTP to the right  mid/upper back, at the mid rib area;  pain with movement of the right shoulder;   Neurological:     General:  No focal deficit present.     Mental Status: She is alert.  Psychiatric:        Mood and Affect: Mood normal.      UC Treatments / Results  Labs (all labs ordered are listed, but only abnormal results are displayed) Labs Reviewed - No data to display  EKG   Radiology No results found.  Procedures Procedures (including critical care time)  Medications Ordered in UC Medications  ketorolac  (TORADOL ) 30 MG/ML injection 30 mg (has no administration in time range)    Initial Impression / Assessment and Plan / UC Course  I have reviewed the triage vital signs and the nursing notes.  Pertinent labs & imaging results that were available during my care of the patient were reviewed by me and considered in my medical decision making (see chart for details).   Final Clinical Impressions(s) / UC Diagnoses   Final diagnoses:  Mid back pain on right side     Discharge Instructions      You were seen today for mid back pain.  Your xray does not show any abnormality, but if the radiologist reads this differently we will notify you.  Your pain appears muscular.  I have given you a shot of toradol  today.  I have sent out a muscle relaxer as well.  This may make you tired/drowsy so please take when home and not driving.  You may trial heat/ice as well for pain.  You may continue tylenol /motrin  also.  If you are not improving, or having worsening symptoms, then please return for re-evaluation.     ED Prescriptions     Medication Sig Dispense Auth. Provider   tiZANidine (ZANAFLEX) 4 MG tablet Take 1 tablet (4 mg total) by mouth every 6 (six) hours as needed for muscle spasms. 30 tablet Darral Longs, MD      PDMP not reviewed this encounter.     [1]  Social History Tobacco Use   Smoking status: Never   Smokeless tobacco: Never  Vaping Use   Vaping status: Never  Used  Substance Use Topics   Alcohol use: No   Drug use: No     Darral Longs, MD 02/17/24 [phone]

## 2024-02-17 NOTE — Discharge Instructions (Addendum)
 You were seen today for mid back pain.  Your xray does not show any abnormality, but if the radiologist reads this differently we will notify you.  Your pain appears muscular.  I have given you a shot of toradol  today.  I have sent out a muscle relaxer as well.  This may make you tired/drowsy so please take when home and not driving.  You may trial heat/ice as well for pain.  You may continue tylenol /motrin  also.  If you are not improving, or having worsening symptoms, then please return for re-evaluation.

## 2024-03-21 ENCOUNTER — Encounter: Payer: Self-pay | Admitting: Emergency Medicine

## 2024-03-21 ENCOUNTER — Other Ambulatory Visit: Payer: Self-pay

## 2024-03-21 ENCOUNTER — Ambulatory Visit
Admission: EM | Admit: 2024-03-21 | Discharge: 2024-03-21 | Disposition: A | Discharge status: Home / Self Care | Payer: PRIVATE HEALTH INSURANCE | Attending: Internal Medicine | Admitting: Internal Medicine

## 2024-03-21 DIAGNOSIS — R399 Unspecified symptoms and signs involving the genitourinary system: Secondary | ICD-10-CM | POA: Diagnosis not present

## 2024-03-21 DIAGNOSIS — N3001 Acute cystitis with hematuria: Secondary | ICD-10-CM | POA: Insufficient documentation

## 2024-03-21 LAB — POCT URINE DIPSTICK
Bilirubin, UA: NEGATIVE
Glucose, UA: NEGATIVE mg/dL
Ketones, POC UA: NEGATIVE mg/dL
Nitrite, UA: NEGATIVE
POC PROTEIN,UA: NEGATIVE
Spec Grav, UA: 1.01
Urobilinogen, UA: 0.2 U/dL
pH, UA: 6.5

## 2024-03-21 MED ORDER — CEPHALEXIN 500 MG PO CAPS: 500.0000 mg | ORAL_CAPSULE | Freq: Two times a day (BID) | ORAL | 0 refills | Status: AC

## 2024-03-21 MED ORDER — ONDANSETRON 4 MG PO TBDP: 4.0000 mg | ORAL_TABLET | Freq: Three times a day (TID) | ORAL | 0 refills | Status: AC | PRN

## 2024-03-21 NOTE — ED Provider Notes (Signed)
 " [facility]    CSN: [id number] Arrival date & time: 03/21/24  0808      History   Chief Complaint Chief Complaint  Patient presents with   Dysuria    HPI [name] is a 59 y.o. female.   [name] is a 60 y.o. female presenting for chief complaint of suprapubic pressure, urinary discomfort, urinary frequency, and left lower back pain that started 5-6 days ago.  She has developed new worsening dysuria in the last 24 to 48 hours along with nausea without vomiting and generalized head pain.  She is concerned that she may have a urinary tract infection.  She denies vomiting, upper abdominal pain, diarrhea, gross hematuria, hematochezia/melena, flank pain, dizziness, and fever/chills.  Denies use of SGLT2 inhibitor.  Drinks plenty of water , denies frequent intake of urinary irritants.  She has not attempted treatment of symptoms at home. No allergies to antibiotics. She has not had recent antibiotics in the last 90 days. Last UTI was a year ago.    Dysuria   Past Medical History:  Diagnosis Date   Arthritis    COVID-19 06/2018   Esophageal stenosis    Esophagitis    GERD (gastroesophageal reflux disease)    Hiatal hernia    Hypertension    Iron deficiency anemia    Migraines    Morbid obesity (HCC)    Palpitations 05/12/2011   Monitor 08/2022: NSR, rare nonsustained SVT (longest 7 beats), rare PVCs/PACs      Sleep apnea         Patient Active Problem List   Diagnosis Date Noted   Need for immunization against influenza 12/22/2023   Intermittent palpitations 08/26/2022   Esophageal diverticulum    Migraine 08/18/2017   Vitamin D  deficiency 04/27/2017   HLD (hyperlipidemia) 06/27/2016   Gastroesophageal reflux disease with esophagitis 12/20/2014   Fatty liver disease, nonalcoholic 12/11/2014   Obstructive sleep apnea 09/28/2013   Esophageal dysmotility 06/28/2013   Prediabetes 06/28/2013   Low back pain radiating to both legs 06/28/2013    Essential hypertension, benign 11/04/2011   Palpitations 05/12/2011    Past Surgical History:  Procedure Laterality Date   ABDOMINAL HYSTERECTOMY     ABDOMINAL HYSTERECTOMY  2003   CARDIOVASCULAR STRESS TEST  07/25/2007   Negative stress echo; ef 55-60%   ESOPHAGEAL MANOMETRY  03/07/2012   Procedure: ESOPHAGEAL MANOMETRY (EM);  Surgeon: Alm JONELLE Gander, MD;  Location: [facility];  Service: Endoscopy;  Laterality: N/A;   ESOPHAGEAL MANOMETRY N/A 06/08/2019   Procedure: ESOPHAGEAL MANOMETRY (EM);  Surgeon: Avram Lupita BRAVO, MD;  Location: [facility];  Service: Endoscopy;  Laterality: N/A;   ESOPHAGOGASTRODUODENOSCOPY     ESOPHAGOGASTRODUODENOSCOPY     ESOPHAGOGASTRODUODENOSCOPY (EGD) WITH PROPOFOL  N/A 06/08/2019   Procedure: ESOPHAGOGASTRODUODENOSCOPY (EGD) WITH PROPOFOL ;  Surgeon: Avram Lupita BRAVO, MD;  Location: [facility];  Service: Endoscopy;  Laterality: N/A;   KNEE ARTHROSCOPY  01/2011   left   KNEE SURGERY      OB History   No obstetric history on file.      Home Medications    Prior to Admission medications  Medication Sig Start Date End Date Taking? Authorizing Provider  cephALEXin  (KEFLEX ) 500 MG capsule Take 1 capsule (500 mg total) by mouth 2 (two) times daily for 7 days. 03/21/24 03/28/24 Yes Enedelia Dorna HERO, FNP  ondansetron  (ZOFRAN -ODT) 4 MG disintegrating tablet Take 1 tablet (4 mg total) by mouth every 8 (eight) hours as needed for nausea or vomiting. 03/21/24  Yes Enedelia Dorna HERO, FNP  acetaminophen  (TYLENOL ) 325 MG tablet Take 650 mg by mouth every 6 (six) hours as needed (for pain.).    [provider]  amLODipine  (NORVASC ) 5 MG tablet Take 1 tablet (5 mg total) by mouth daily. 12/15/23   Joshua Debby CROME, MD  Cholecalciferol  1.25 MG (50000 UT) capsule Take 1 capsule (50,000 Units total) by mouth once a week. 12/15/23   Joshua Debby CROME, MD  famotidine  (PEPCID ) 40 MG tablet TAKE 1 TABLET(40 MG) BY MOUTH AT BEDTIME 09/12/20   Avram Lupita BRAVO, MD   ibuprofen  (ADVIL ) 200 MG tablet Take 600 mg by mouth every 6 (six) hours as needed.    [provider]  metoprolol  tartrate (LOPRESSOR ) 25 MG tablet TAKE 1 TABLET BY MOUTH TWICE A DAY, MAY TAKE 1/2-1 EXTRA TABLET ONLY AS NEEDED FOR PALPITATIONS 09/28/23   Lelon Hamilton T, PA-C  omeprazole  (PRILOSEC) 40 MG capsule TAKE 1 CAPSULE BY MOUTH EVERY DAY AT 2 AM 08/24/23   Joshua Debby CROME, MD  sucralfate  (CARAFATE ) 1 GM/10ML suspension 10 mL orally QIDACHS; Duration: 30 day(s) 04/26/06   [provider]  tiZANidine  (ZANAFLEX ) 4 MG tablet Take 1 tablet (4 mg total) by mouth every 6 (six) hours as needed for muscle spasms. 02/17/24   Piontek, Rocky, MD  triamterene -hydrochlorothiazide  (DYAZIDE) 37.5-25 MG capsule Take 1 each (1 capsule total) by mouth daily. 08/26/22   Joshua Debby CROME, MD    Family History Family History  Problem Relation Age of Onset   Cervical cancer Mother    Hypertension Sister    Diabetes Sister    Heart disease Neg Hx    Cancer Neg Hx    Alcohol abuse Neg Hx    Early death Neg Hx    Hearing loss Neg Hx    Hyperlipidemia Neg Hx    Kidney disease Neg Hx    Stroke Neg Hx     Social History Social History[1]   Allergies   Oxycodone and Pneumococcal vaccines   Review of Systems Review of Systems  Genitourinary:  Positive for dysuria.  Per HPI   Physical Exam Triage Vital Signs ED Triage Vitals  Encounter Vitals Group     BP 03/21/24 0829 (!) 152/91     Girls Systolic BP Percentile --      Girls Diastolic BP Percentile --      Boys Systolic BP Percentile --      Boys Diastolic BP Percentile --      Pulse Rate 03/21/24 0829 83     Resp 03/21/24 0829 17     Temp 03/21/24 0829 98.5 F (36.9 C)     Temp Source 03/21/24 0829 Tympanic     SpO2 03/21/24 0829 97 %     Weight --      Height --      Head Circumference --      Peak Flow --      Pain Score 03/21/24 0839 2     Pain Loc --      Pain Education --      Exclude from Growth Chart --     No data found.  Updated Vital Signs BP (!) 152/91 (BP Location: Left Arm)   Pulse 83   Temp 98.5 F (36.9 C) (Tympanic)   Resp 17   SpO2 97%   Visual Acuity Right Eye Distance:   Left Eye Distance:   Bilateral Distance:    Right Eye Near:   Left Eye Near:  Bilateral Near:     Physical Exam Vitals and nursing note reviewed.  Constitutional:      Appearance: She is not ill-appearing or toxic-appearing.  HENT:     Head: Normocephalic and atraumatic.     Right Ear: Hearing and external ear normal.     Left Ear: Hearing and external ear normal.     Nose: Nose normal.     Mouth/Throat:     Lips: Pink.     Mouth: Mucous membranes are moist.  Eyes:     General: Lids are normal. Vision grossly intact. Gaze aligned appropriately.     Extraocular Movements: Extraocular movements intact.     Conjunctiva/sclera: Conjunctivae normal.  Pulmonary:     Effort: Pulmonary effort is normal.  Abdominal:     General: Bowel sounds are normal.     Palpations: Abdomen is soft.     Tenderness: There is no abdominal tenderness. There is no right CVA tenderness, left CVA tenderness or guarding.  Musculoskeletal:     Cervical back: Neck supple.  Skin:    General: Skin is warm and dry.     Capillary Refill: Capillary refill takes less than 2 seconds.     Findings: No rash.  Neurological:     General: No focal deficit present.     Mental Status: She is alert and oriented to person, place, and time. Mental status is at baseline.     Cranial Nerves: No dysarthria or facial asymmetry.  Psychiatric:        Mood and Affect: Mood normal.        Speech: Speech normal.        Behavior: Behavior normal.        Thought Content: Thought content normal.        Judgment: Judgment normal.      UC Treatments / Results  Labs (all labs ordered are listed, but only abnormal results are displayed) Labs Reviewed  POCT URINE DIPSTICK - Abnormal; Notable for the following components:      Result  Value   Clarity, UA cloudy (*)    Blood, UA small (*)    Leukocytes, UA Small (1+) (*)    All other components within normal limits  URINE CULTURE    EKG   Radiology No results found.  Procedures Procedures (including critical care time)  Medications Ordered in UC Medications - No data to display  Initial Impression / Assessment and Plan / UC Course  I have reviewed the triage vital signs and the nursing notes.  Pertinent labs & imaging results that were available during my care of the patient were reviewed by me and considered in my medical decision making (see chart for details).   1.  UTI symptoms, acute cystitis with hematuria Evaluation suggests acute cystitis based on presentation and urinalysis findings in clinic.  Urine culture pending.  Low suspicion for acute pyelonephritis, kidney stone or infected stone.  Keflex  antibiotic ordered. Appears well hydrated, therefore will defer labs/imaging.  Vitals stable.  Patient to push fluids to stay well hydrated and reduce intake of known urinary irritants.  Discussed methods of preventing future UTI.   Counseled patient on potential for adverse effects with medications prescribed/recommended today, strict ER and return-to-clinic precautions discussed, patient verbalized understanding.    Final Clinical Impressions(s) / UC Diagnoses   Final diagnoses:  UTI symptoms  Acute cystitis with hematuria     Discharge Instructions      Your urine shows you likely have a  urinary tract infection.   I have sent your urine for culture to confirm this.   We will call you if we need to change your antibiotic when we find out the type of bacteria growing in your bladder.  Take antibiotic as directed with a snack/food to avoid stomach upset. To avoid GI upset please take this medication with food.   Avoid drinking beverages that irritate the urinary tract like sodas, tea, coffee, or juice. Drink plenty of water  to stay well  hydrated and prevent severe infection.  If you develop pain in your upper back on one side, fever despite taking antibiotic, nausea and vomiting where you cannot keep anything down for 24 hours, dizziness/severe headache, or decreased urinary output, please go to the ER. Follow-up with PCP.      ED Prescriptions     Medication Sig Dispense Auth. Provider   cephALEXin  (KEFLEX ) 500 MG capsule Take 1 capsule (500 mg total) by mouth 2 (two) times daily for 7 days. 14 capsule Enedelia Dorna HERO, FNP   ondansetron  (ZOFRAN -ODT) 4 MG disintegrating tablet Take 1 tablet (4 mg total) by mouth every 8 (eight) hours as needed for nausea or vomiting. 20 tablet Enedelia Dorna HERO, FNP      PDMP not reviewed this encounter.    [1]  Social History Tobacco Use   Smoking status: Never   Smokeless tobacco: Never  Vaping Use   Vaping status: Never Used  Substance Use Topics   Alcohol use: No   Drug use: No     Enedelia Dorna HERO, FNP 03/21/24 0915  "

## 2024-03-21 NOTE — ED Triage Notes (Signed)
 Pt here for dysuria x 10 days; pt sts improved with home meds but then returned; pt sts some pressure; pt sts did UA at work last night and showed large leuks

## 2024-03-21 NOTE — Discharge Instructions (Signed)
 Your urine shows you likely have a urinary tract infection.   I have sent your urine for culture to confirm this.   We will call you if we need to change your antibiotic when we find out the type of bacteria growing in your bladder.  Take antibiotic as directed with a snack/food to avoid stomach upset. To avoid GI upset please take this medication with food.   Avoid drinking beverages that irritate the urinary tract like sodas, tea, coffee, or juice. Drink plenty of water  to stay well hydrated and prevent severe infection.  If you develop pain in your upper back on one side, fever despite taking antibiotic, nausea and vomiting where you cannot keep anything down for 24 hours, dizziness/severe headache, or decreased urinary output, please go to the ER. Follow-up with PCP.

## 2024-03-23 ENCOUNTER — Ambulatory Visit (HOSPITAL_COMMUNITY): Payer: Self-pay | Admitting: Internal Medicine

## 2024-03-23 LAB — URINE CULTURE: Culture: >=100000 — AB

## 2024-06-13 ENCOUNTER — Ambulatory Visit: Payer: Self-pay | Admitting: Internal Medicine

## 2024-06-13 ENCOUNTER — Ambulatory Visit: Admitting: Internal Medicine

## 2024-12-14 ENCOUNTER — Encounter: Admitting: Internal Medicine
